# Patient Record
Sex: Female | Born: 1990
Health system: Southern US, Community
[De-identification: ages and names within clinical notes are randomized; demographics above are authoritative.]

## PROBLEM LIST (undated history)

## (undated) DIAGNOSIS — B009 Herpesviral infection, unspecified: Secondary | ICD-10-CM

## (undated) DIAGNOSIS — F1910 Other psychoactive substance abuse, uncomplicated: Secondary | ICD-10-CM

## (undated) DIAGNOSIS — N870 Mild cervical dysplasia: Secondary | ICD-10-CM

## (undated) DIAGNOSIS — B20 Human immunodeficiency virus [HIV] disease: Secondary | ICD-10-CM

## (undated) DIAGNOSIS — R739 Hyperglycemia, unspecified: Secondary | ICD-10-CM

## (undated) DIAGNOSIS — T7840XA Allergy, unspecified, initial encounter: Secondary | ICD-10-CM

## (undated) DIAGNOSIS — L309 Dermatitis, unspecified: Secondary | ICD-10-CM

## (undated) DIAGNOSIS — O139 Gestational [pregnancy-induced] hypertension without significant proteinuria, unspecified trimester: Secondary | ICD-10-CM

## (undated) DIAGNOSIS — A749 Chlamydial infection, unspecified: Secondary | ICD-10-CM

## (undated) DIAGNOSIS — O24419 Gestational diabetes mellitus in pregnancy, unspecified control: Secondary | ICD-10-CM

## (undated) DIAGNOSIS — A630 Anogenital (venereal) warts: Secondary | ICD-10-CM

## (undated) DIAGNOSIS — F331 Major depressive disorder, recurrent, moderate: Secondary | ICD-10-CM

## (undated) DIAGNOSIS — J45909 Unspecified asthma, uncomplicated: Secondary | ICD-10-CM

## (undated) DIAGNOSIS — R87629 Unspecified abnormal cytological findings in specimens from vagina: Secondary | ICD-10-CM

## (undated) DIAGNOSIS — F4321 Adjustment disorder with depressed mood: Secondary | ICD-10-CM

## (undated) DIAGNOSIS — L732 Hidradenitis suppurativa: Secondary | ICD-10-CM

## (undated) DIAGNOSIS — E119 Type 2 diabetes mellitus without complications: Secondary | ICD-10-CM

## (undated) DIAGNOSIS — J45990 Exercise induced bronchospasm: Secondary | ICD-10-CM

## (undated) DIAGNOSIS — G43009 Migraine without aura, not intractable, without status migrainosus: Secondary | ICD-10-CM

## (undated) DIAGNOSIS — L709 Acne, unspecified: Secondary | ICD-10-CM

## (undated) DIAGNOSIS — N900 Mild vulvar dysplasia: Secondary | ICD-10-CM

## (undated) DIAGNOSIS — R768 Other specified abnormal immunological findings in serum: Secondary | ICD-10-CM

## (undated) DIAGNOSIS — Z21 Asymptomatic human immunodeficiency virus [HIV] infection status: Secondary | ICD-10-CM

## (undated) HISTORY — DX: Other specified abnormal immunological findings in serum: R76.8

## (undated) HISTORY — DX: Hyperglycemia, unspecified: R73.9

## (undated) HISTORY — DX: Type 2 diabetes mellitus without complications: E11.9

## (undated) HISTORY — DX: Hidradenitis suppurativa: L73.2

## (undated) HISTORY — DX: Acne, unspecified: L70.9

## (undated) HISTORY — DX: Mild cervical dysplasia: N87.0

## (undated) HISTORY — DX: Dermatitis, unspecified: L30.9

## (undated) HISTORY — DX: Adjustment disorder with depressed mood: F43.21

## (undated) HISTORY — DX: Asymptomatic human immunodeficiency virus (hiv) infection status: Z21

## (undated) HISTORY — DX: Anogenital (venereal) warts: A63.0

## (undated) HISTORY — DX: Migraine without aura, not intractable, without status migrainosus: G43.009

## (undated) HISTORY — DX: Chlamydial infection, unspecified: A74.9

## (undated) HISTORY — DX: Unspecified asthma, uncomplicated: J45.909

## (undated) HISTORY — DX: Allergy, unspecified, initial encounter: T78.40XA

## (undated) HISTORY — DX: Mild vulvar dysplasia: N90.0

## (undated) HISTORY — DX: Exercise induced bronchospasm: J45.990

## (undated) HISTORY — DX: Other psychoactive substance abuse, uncomplicated: F19.10

## (undated) HISTORY — DX: Human immunodeficiency virus (HIV) disease: B20

## (undated) MED FILL — Medication: Fill #1 | Status: CN

---
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
## 2012-02-27 ENCOUNTER — Ambulatory Visit: Payer: 59 | Admitting: Family Medicine

## 2012-02-27 VITALS — BP 108/64 | HR 108 | Temp 98.8°F | Resp 20 | Ht 64.0 in | Wt 232.0 lb

## 2012-02-27 DIAGNOSIS — R51 Headache: Secondary | ICD-10-CM

## 2012-02-27 DIAGNOSIS — J329 Chronic sinusitis, unspecified: Secondary | ICD-10-CM

## 2012-02-27 DIAGNOSIS — R5381 Other malaise: Secondary | ICD-10-CM

## 2012-02-27 DIAGNOSIS — R509 Fever, unspecified: Secondary | ICD-10-CM

## 2012-02-27 DIAGNOSIS — R6889 Other general symptoms and signs: Secondary | ICD-10-CM

## 2012-02-27 LAB — POCT RAPID STREP A (OFFICE): Rapid Strep A Screen: NEGATIVE

## 2012-02-27 MED ORDER — CEFDINIR 300 MG PO CAPS: 300.0000 mg | ORAL_CAPSULE | Freq: Two times a day (BID) | ORAL | Status: DC

## 2012-02-27 NOTE — Progress Notes (Signed)
[facility] [address], [place] [address] [phone]  Date:  02/27/2012   Name:  Kelly Hunter   DOB:  01-15-1991   MRN:  [id number]  PCP:  Abbe Amsterdam, MD    Chief Complaint: Sore Throat, Fever and Migraine   History of Present Illness:  Kelly Hunter is a 22 y.o. very pleasant female patient who presents with the following:  She is here today with illness.  About a week ago she noted a cold- she had runny and stuffy nose, and a cough.  Her cold symptoms seemed to get better, but then yesterday she felt worse again and had a HA.  She is NOT having a ST at this time.  She also has body aches, a fever, chills, and fatigue.  She has an occasional cough.   She took some excedrin- last dose yesterday.    Actually, upon further review it turns out she has had the HA for 5 days, and she notes sinus pressure and pain.   She has a history of EIA and obesity  Patient Active Problem List  Diagnosis  . OBESITY, NOS  . ASTHMA, EXERCISE INDUCED  . HYDRADENITIS  . ACNE    Past Medical History  Diagnosis Date  . Allergy   . Asthma     Past Surgical History  Procedure Date  . Breast surgery     History  Substance Use Topics  . Smoking status: Never Smoker   . Smokeless tobacco: Not on file  . Alcohol Use: No    Family History  Problem Relation Age of Onset  . Diabetes Mother   . Hypertension Mother     No Known Allergies  Medication list has been reviewed and updated.  Current Outpatient Prescriptions on File Prior to Visit  Medication Sig Dispense Refill  . norgestimate-ethinyl estradiol (SPRINTEC 28) 0.25-35 MG-MCG tablet Take 1 tablet by mouth daily.  1 Package  11  . Triamcinolone Acetonide (TRIAMCINOLONE 0.1 % CREAM : EUCERIN) CREA Use as directed  1 each  11    Review of Systems:  As per HPI- otherwise negative.   Physical Examination: Filed Vitals:   02/27/12 1100  BP: 108/64  Pulse: 120  Temp: 99 F (37.2 C)    Resp: 20   Filed Vitals:   02/27/12 1100  Height: 5\' 4"  (1.626 m)  Weight: 232 lb (105.235 kg)   Body mass index is 39.82 kg/(m^2). Ideal Body Weight: Weight in (lb) to have BMI = 25: 145.3   GEN: WDWN, NAD, Non-toxic, A & O x 3, obese HEENT: Atraumatic, Normocephalic. Neck supple. No masses, No LAD.  Bilateral TM wnl, oropharynx injected especially on the right.   PEERL,EOMI.  eczema of the external ears Ears and Nose: No external deformity. CV: RRR, No M/G/R. No JVD. No thrill. No extra heart sounds. PULM: CTA B, no wheezes, crackles, rhonchi. No retractions. No resp. distress. No accessory muscle use. ABD: S, NT, ND, +BS. No rebound. No HSM. EXTR: No c/c/e NEURO Normal gait.  PSYCH: Normally interactive. Conversant. Not depressed or anxious appearing.  Calm demeanor.   Results for orders placed in visit on 02/27/12  POCT RAPID STREP A (OFFICE)      Component Value Range   Rapid Strep A Screen Negative  Negative  POCT INFLUENZA A/B      Component Value Range   Influenza A, POC Negative     Influenza B, POC Negative  Given 600 mg of ibuprofen by mouth at 11:45 am- temp and pulse better, her HA was relieved.    Assessment and Plan: 1. Sinusitis  cefdinir (OMNICEF) 300 MG capsule  2. Fever  POCT rapid strep A, POCT Influenza A/B  3. Malaise  POCT Influenza A/B  4. Headache     Kelly Hunter is here today with illness.  Suspect she has a sinus infection.  Will treat with omnicef.  Let me know if not better in the next few days, sooner if worse or if any severe HA or if worst HA of life occurs.  She and her mother agreed.    Abbe Amsterdam, MD

## 2012-02-27 NOTE — Patient Instructions (Addendum)
It looks like you have a sinus infection.  You should try mucinex for the sinus pressure and congestion, and ibuprofen/ tylenol as needed for pain. If your headache is not getting better, or if you have the worst headache you have ever had or there worrisome symptoms please let us know.

## 2012-02-29 ENCOUNTER — Telehealth: Payer: Self-pay

## 2012-02-29 NOTE — Telephone Encounter (Signed)
Pt states that she was in recently was and was given an antibiotic for sinus pressure, pt states that the antibiotic is not helping. Pt still complains of throat pain, vomiting and diarhhea, pt has not completed antibiotic as of yet. Please Advise.  Pharmacy: [facility] [address] Phone: [phone]

## 2012-02-29 NOTE — Telephone Encounter (Signed)
Called patient advised to take antibiotics with food. Drink lots of fluids, take Mucinex for sinus pressure. Left detailed message for her to call me back.

## 2012-03-01 ENCOUNTER — Ambulatory Visit: Payer: 59 | Admitting: Emergency Medicine

## 2012-03-01 ENCOUNTER — Other Ambulatory Visit: Payer: Self-pay | Admitting: Emergency Medicine

## 2012-03-01 ENCOUNTER — Telehealth: Payer: Self-pay

## 2012-03-01 VITALS — BP 120/76 | HR 84 | Temp 97.4°F | Resp 18 | Ht 64.0 in | Wt 232.0 lb

## 2012-03-01 DIAGNOSIS — K921 Melena: Secondary | ICD-10-CM

## 2012-03-01 DIAGNOSIS — R509 Fever, unspecified: Secondary | ICD-10-CM

## 2012-03-01 DIAGNOSIS — R110 Nausea: Secondary | ICD-10-CM

## 2012-03-01 DIAGNOSIS — R109 Unspecified abdominal pain: Secondary | ICD-10-CM

## 2012-03-01 DIAGNOSIS — R8290 Unspecified abnormal findings in urine: Secondary | ICD-10-CM

## 2012-03-01 DIAGNOSIS — R319 Hematuria, unspecified: Secondary | ICD-10-CM

## 2012-03-01 DIAGNOSIS — R82998 Other abnormal findings in urine: Secondary | ICD-10-CM

## 2012-03-01 DIAGNOSIS — J029 Acute pharyngitis, unspecified: Secondary | ICD-10-CM

## 2012-03-01 LAB — POCT URINALYSIS DIPSTICK
Glucose, UA: NEGATIVE
Spec Grav, UA: 1.025

## 2012-03-01 LAB — COMPREHENSIVE METABOLIC PANEL
Albumin: 4.1 g/dL (ref 3.5–5.2)
Alkaline Phosphatase: 40 U/L (ref 39–117)
BUN: 5 mg/dL — ABNORMAL LOW (ref 6–23)
CO2: 28 mEq/L (ref 19–32)
Calcium: 8.7 mg/dL (ref 8.4–10.5)
Glucose, Bld: 108 mg/dL — ABNORMAL HIGH (ref 70–99)
Potassium: 3.7 mEq/L (ref 3.5–5.3)

## 2012-03-01 LAB — POCT CBC
Hemoglobin: 13.7 g/dL (ref 12.2–16.2)
Lymph, poc: 1.9 (ref 0.6–3.4)
MCH, POC: 25.8 pg — AB (ref 27–31.2)
MCHC: 32.8 g/dL (ref 31.8–35.4)
MCV: 78.6 fL — AB (ref 80–97)
MID (cbc): 0.4 (ref 0–0.9)
MPV: 9.4 fL (ref 0–99.8)
POC MID %: 8 %M (ref 0–12)
WBC: 4.6 10*3/uL (ref 4.6–10.2)

## 2012-03-01 LAB — POCT UA - MICROSCOPIC ONLY: Crystals, Ur, HPF, POC: NEGATIVE

## 2012-03-01 MED ORDER — ONDANSETRON 8 MG PO TBDP: 8.0000 mg | ORAL_TABLET | Freq: Three times a day (TID) | ORAL | Status: DC | PRN

## 2012-03-01 NOTE — Telephone Encounter (Signed)
Diarrhea and throwing up from the antibiotics (she thinks)  Wants to know if the medicine is causing these issues.  Also, needs note for out - Thursday and Friday of this week   [phone]

## 2012-03-01 NOTE — Telephone Encounter (Signed)
Spoke with pt advised message from Amy, pt understood.

## 2012-03-01 NOTE — Progress Notes (Signed)
Subjective:    Patient ID: Kelly Hunter, female    DOB: 30-May-1990, 22 y.o.   MRN: [id number]  HPI Follow up from Monday. Patient is experiencing fever, sore throat, vomiting, diarrhea, blood in stool and nose bleed. Patient is also experiencing abdominal pain. Patient states that vomiting and diarrhea started the morning after her first dose of antibiotic.  Patient denies history of mono. Her throat is still sore. She has nausea. She has some upper abdominal discomfort. She had a loose stool and felt like there was blood in it. She only took 2 doses of antibiotics she's only had one stool where she saw some current jelly-type material. She continues to feel nauseated but has not vomited up any blood. She states she's only had one episode of blood in her stool .  Review of Systems     Objective:   Physical Exam HEENT exam is unremarkable. What is positive some mild redness of the posterior pharynx on the right greater than the left. There are no nodes palpable. Her chest was clear her cardiac exam revealed regular rate without murmurs the abdomen was obese bowel sounds are normal there is tenderness in the midepigastrium. Examination of skin reveals an acneform eruption.. She also has some half centimeter nonraised red macular areas on the forearms There is no rash visible on the lower extremities. There is no rash on the buttocks. Results for orders placed in visit on 03/01/12  POCT CBC      Component Value Range   WBC 4.6  4.6 - 10.2 K/uL   Lymph, poc 1.9  0.6 - 3.4   POC LYMPH PERCENT 40.7  10 - 50 %L   MID (cbc) 0.4  0 - 0.9   POC MID % 8.0  0 - 12 %M   POC Granulocyte 2.4  2 - 6.9   Granulocyte percent 51.3  37 - 80 %G   RBC 5.32  4.04 - 5.48 M/uL   Hemoglobin 13.7  12.2 - 16.2 g/dL   HCT, POC 40.9  81.1 - 47.9 %   MCV 78.6 (*) 80 - 97 fL   MCH, POC 25.8 (*) 27 - 31.2 pg   MCHC 32.8  31.8 - 35.4 g/dL   RDW, POC 91.4     Platelet Count, POC 166  142 - 424 K/uL   MPV 9.4  0 -  99.8 fL  POCT RAPID STREP A (OFFICE)      Component Value Range   Rapid Strep A Screen Negative  Negative  IFOBT (OCCULT BLOOD)      Component Value Range   IFOBT Positive          Assessment & Plan:  I have ordered a CBC strep test and stool for heme sure . I'm going to give her some Zofran for nausea. Also would like her to take Zantac 150 twice a day. She's not to take any antibiotics. We'll recheck in 24-48 hours if not improving. She can take Tylenol for the general malaise. She did have bilirubin in her urine however on exam she does not appear to be jaundiced. There is no icteric changes palms look normal I did not see any signs of elevated bilirubin. The positive bilirubin in her urine may be secondary to the blood she has. She definitely needs a repeat urine when she returns to clinic to see if there is blood in urine. Hopefully I will have her comprehensive metabolic panel back. Her last menstrual period was January  1. She is on birth control pills.

## 2012-03-01 NOTE — Telephone Encounter (Signed)
Called patient, if not better needs to return to clinic. Nausea reported yesterday, advised patient to try to take meds with food. I called and spoke to her mother. Patient now has blood in stool. Advised her to come in immediately for this. Mom states they are already here. Amy

## 2012-03-01 NOTE — Patient Instructions (Addendum)
Please take Zantac 150 mg twice a day. He can take extra strength Tylenol 2 capsules up to 3 times a day Given prescription for Zofran 8 mg to take as needed for nausea. Please return to clinic on Saturday for recheck.

## 2012-03-01 NOTE — Progress Notes (Deleted)
  Subjective:    Patient ID: Kelly Hunter, female    DOB: Jan 16, 1991, 22 y.o.   MRN: [id number]  HPI    Review of Systems     Objective:   Physical Exam        Assessment & Plan:

## 2012-03-02 ENCOUNTER — Telehealth: Payer: Self-pay | Admitting: Emergency Medicine

## 2012-03-02 LAB — EPSTEIN-BARR VIRUS VCA ANTIBODY PANEL
EBV EA IgG: 97.5 U/mL — ABNORMAL HIGH (ref <9.0)
EBV NA IgG: 486 U/mL — ABNORMAL HIGH (ref <18.0)
EBV VCA IgG: >750 U/mL — ABNORMAL HIGH (ref <18.0)
EBV VCA IgM: <10 U/mL (ref <36.0)

## 2012-03-03 ENCOUNTER — Ambulatory Visit (HOSPITAL_COMMUNITY)
Admission: RE | Admit: 2012-03-03 | Discharge: 2012-03-03 | Disposition: A | Discharge status: Home / Self Care | Payer: 59 | Source: Ambulatory Visit | Attending: Emergency Medicine | Admitting: Emergency Medicine

## 2012-03-03 ENCOUNTER — Ambulatory Visit (INDEPENDENT_AMBULATORY_CARE_PROVIDER_SITE_OTHER): Payer: 59 | Admitting: Emergency Medicine

## 2012-03-03 ENCOUNTER — Other Ambulatory Visit: Payer: Self-pay | Admitting: Emergency Medicine

## 2012-03-03 VITALS — BP 113/78 | HR 96 | Temp 98.6°F | Resp 17 | Ht 64.0 in | Wt 223.0 lb

## 2012-03-03 DIAGNOSIS — R319 Hematuria, unspecified: Secondary | ICD-10-CM

## 2012-03-03 DIAGNOSIS — R809 Proteinuria, unspecified: Secondary | ICD-10-CM

## 2012-03-03 DIAGNOSIS — K921 Melena: Secondary | ICD-10-CM

## 2012-03-03 DIAGNOSIS — A749 Chlamydial infection, unspecified: Secondary | ICD-10-CM

## 2012-03-03 DIAGNOSIS — R52 Pain, unspecified: Secondary | ICD-10-CM

## 2012-03-03 DIAGNOSIS — R700 Elevated erythrocyte sedimentation rate: Secondary | ICD-10-CM

## 2012-03-03 DIAGNOSIS — R109 Unspecified abdominal pain: Secondary | ICD-10-CM

## 2012-03-03 DIAGNOSIS — R7989 Other specified abnormal findings of blood chemistry: Secondary | ICD-10-CM

## 2012-03-03 DIAGNOSIS — R21 Rash and other nonspecific skin eruption: Secondary | ICD-10-CM

## 2012-03-03 HISTORY — DX: Chlamydial infection, unspecified: A74.9

## 2012-03-03 LAB — POCT CBC
Granulocyte percent: 67.5 %G (ref 37–80)
HCT, POC: 40.7 % (ref 37.7–47.9)
Hemoglobin: 13.2 g/dL (ref 12.2–16.2)
POC Granulocyte: 3.6 (ref 2–6.9)
POC LYMPH PERCENT: 25.8 %L (ref 10–50)
RBC: 5.2 M/uL (ref 4.04–5.48)
RDW, POC: 16.9 %

## 2012-03-03 LAB — COMPREHENSIVE METABOLIC PANEL
ALT: 124 U/L — ABNORMAL HIGH (ref 0–35)
AST: 89 U/L — ABNORMAL HIGH (ref 0–37)
BUN: 6 mg/dL (ref 6–23)
Calcium: 9.4 mg/dL (ref 8.4–10.5)
Chloride: 100 mEq/L (ref 96–112)
Creat: 0.73 mg/dL (ref 0.50–1.10)
Potassium: 3.7 mEq/L (ref 3.5–5.3)
Sodium: 140 mEq/L (ref 135–145)
Total Bilirubin: 0.5 mg/dL (ref 0.3–1.2)

## 2012-03-03 LAB — POCT UA - MICROSCOPIC ONLY
Casts, Ur, LPF, POC: NEGATIVE
Crystals, Ur, HPF, POC: NEGATIVE
Mucus, UA: POSITIVE

## 2012-03-03 LAB — POCT URINALYSIS DIPSTICK
Protein, UA: >300
pH, UA: 6.5

## 2012-03-03 LAB — POCT WET PREP WITH KOH
KOH Prep POC: NEGATIVE
Trichomonas, UA: NEGATIVE

## 2012-03-03 LAB — POCT SEDIMENTATION RATE: POCT SED RATE: 34 mm/hr — AB (ref 0–22)

## 2012-03-03 NOTE — Progress Notes (Signed)
  Subjective:    Patient ID: Kelly Hunter, female    DOB: 03-Dec-1990, 22 y.o.   MRN: [id number]  HPI    Review of Systems     Objective:   Physical Exam  Genitourinary: Vaginal discharge (there is moderate yellowish-white discharge present.) found.       Cervix appears WNL.  Negative chandelier sign. bimanula exam unremarkable except for mild TTP over uterus.  Adnexa unremarkable.          Assessment & Plan:

## 2012-03-03 NOTE — Progress Notes (Signed)
Subjective:    Patient ID: Kelly Hunter, female    DOB: May 01, 1990, 22 y.o.   MRN: [id number]  HPI  22 year old female here for follow up to have blood work reworked.  She has been on birth control for some time now.  She does get up to urinate at night but does not have any burning.  Has not had a bowel movement in a few days so no blood in stool that she knows of.  Never had bladder infections before.  No family history of gall bladder issues.  Was very sick two years ago when she was visiting her father and thinks that might be when she had mono.  She hurts in her upper right abdomen.  Mom is very concerned that she is not getting better afraid that she is missing a diagnosis.  Still has rash on her arms but did not spread any where else on body.  Her main complaints at this time are no fever continued upper abdominal pain headache and mild nausea and inability to eat.   Review of Systems she is on birth control pills. She has a history of asthma.     Objective:   Physical Exam patient does not look acutely ill. Her neck is supple. Her chest is clear. Her heart is regular rate without murmurs. The abdomen is soft there is tenderness in the mid epigastrium without rebound. Extremities are without edema. She has an acne-type eruption. She has 2 flat red half centimeter macules on the left forearm.    ESR 34    Results for orders placed in visit on 03/03/12  POCT URINALYSIS DIPSTICK      Component Value Range   Color, UA orange     Clarity, UA cloudy     Glucose, UA neg     Bilirubin, UA small     Ketones, UA trace     Spec Grav, UA 1.020     Blood, UA large     pH, UA 6.5     Protein, UA >300     Urobilinogen, UA 1.0     Nitrite, UA neg     Leukocytes, UA small (1+)    POCT UA - MICROSCOPIC ONLY      Component Value Range   WBC, Ur, HPF, POC 2-8     RBC, urine, microscopic TNTC     Bacteria, U Microscopic 2+     Mucus, UA pos     Epithelial cells, urine per micros 3-8     Crystals, Ur, HPF, POC neg     Casts, Ur, LPF, POC neg     Yeast, UA neg    POCT CBC      Component Value Range   WBC 5.3  4.6 - 10.2 K/uL   Lymph, poc 1.4  0.6 - 3.4   POC LYMPH PERCENT 25.8  10 - 50 %L   MID (cbc) 0.4  0 - 0.9   POC MID % 6.7  0 - 12 %M   POC Granulocyte 3.6  2 - 6.9   Granulocyte percent 67.5  37 - 80 %G   RBC 5.20  4.04 - 5.48 M/uL   Hemoglobin 13.2  12.2 - 16.2 g/dL   HCT, POC 40.9  81.1 - 47.9 %   MCV 78.2 (*) 80 - 97 fL   MCH, POC 25.4 (*) 27 - 31.2 pg   MCHC 32.4  31.8 - 35.4 g/dL   RDW, POC 16.9  Platelet Count, POC 214  142 - 424 K/uL   MPV 10.2  0 - 99.8 fL   Assessment & Plan:  I discussed followup. We'll proceed with an ultrasound of the liver and spleen and kidneys. I have added a sedimentation rate and ACE level and urine culture. She does have significant proteinuria so we will proceed with 24-hour urine for protein if culture is negative. White blood count remained normal. sedimentation rate is slightly elevated at 34. [place] normal. Recheck Monday

## 2012-03-03 NOTE — Patient Instructions (Addendum)
Please see me on Monday morning. Please go to the hospital for your ultrasound. Please drink fluids.

## 2012-03-04 LAB — RPR

## 2012-03-05 ENCOUNTER — Telehealth: Payer: Self-pay | Admitting: Emergency Medicine

## 2012-03-05 ENCOUNTER — Ambulatory Visit: Payer: 59 | Admitting: Emergency Medicine

## 2012-03-05 ENCOUNTER — Other Ambulatory Visit: Payer: Self-pay | Admitting: Emergency Medicine

## 2012-03-05 VITALS — BP 110/80 | HR 109 | Temp 98.4°F | Resp 18 | Ht 64.0 in | Wt 219.0 lb

## 2012-03-05 DIAGNOSIS — R109 Unspecified abdominal pain: Secondary | ICD-10-CM

## 2012-03-05 DIAGNOSIS — R7989 Other specified abnormal findings of blood chemistry: Secondary | ICD-10-CM

## 2012-03-05 DIAGNOSIS — R634 Abnormal weight loss: Secondary | ICD-10-CM

## 2012-03-05 DIAGNOSIS — R809 Proteinuria, unspecified: Secondary | ICD-10-CM

## 2012-03-05 LAB — IRON AND TIBC
%SAT: 9 % — ABNORMAL LOW (ref 20–55)
Iron: 40 ug/dL — ABNORMAL LOW (ref 42–145)
TIBC: 432 ug/dL (ref 250–470)
UIBC: 392 ug/dL (ref 125–400)

## 2012-03-05 LAB — URINE CULTURE

## 2012-03-05 LAB — POCT CBC
Granulocyte percent: 69.7 %G (ref 37–80)
HCT, POC: 40.6 % (ref 37.7–47.9)
Hemoglobin: 13.1 g/dL (ref 12.2–16.2)
MCV: 78 fL — AB (ref 80–97)
POC Granulocyte: 4.2 (ref 2–6.9)
RBC: 5.2 M/uL (ref 4.04–5.48)
RDW, POC: 16.9 %

## 2012-03-05 LAB — CMV ABS, IGG+IGM (CYTOMEGALOVIRUS): CMV IgM: <8 AU/mL (ref <30.00)

## 2012-03-05 LAB — FERRITIN: Ferritin: 94 ng/mL (ref 10–291)

## 2012-03-05 LAB — HEPATITIS PANEL, ACUTE
Hep B C IgM: NEGATIVE
Hepatitis B Surface Ag: NEGATIVE

## 2012-03-05 NOTE — Telephone Encounter (Signed)
fyi referrals: this appointment has already been made for today 03/05/12 at 3:15 pm to see Dr Elnoria Howard

## 2012-03-05 NOTE — Progress Notes (Signed)
  Subjective:    Patient ID: Kelly Hunter, female    DOB: 01-13-1991, 22 y.o.   MRN: [id number]  HPI Patient here with mom. States that she still feels the same, not feeling well at all, nausea without vomiting. Fatigue. She does not have an appetite, she is only eating applesauce. She has been not feeling well, sick a total of 4-6 weeks. No history of seeing a GI Dr. patient has not felt well for a period of time now. She has been nauseated with decreased by mouth intake unable to eat except for applesauce and fluids. There are multiple tests are pending on the patient. When I saw the patient today she tells me that she just feels about the same. She has not had a urinary symptoms .  Review of Systems     Objective:   Physical Exam Patient is alert and cooperative not toxic or ill-appearing. She has a significant acne eruption around her face. The previously noted red macules on both forearms are now resolved. Her neck is supple chest is clear heart regular rate without murmurs abdomen is tender mid epigastrium and right upper quadrant and there is no rebound noted. There is no edema noted. The rash on her forearms has resolved. Results for orders placed in visit on 03/05/12  POCT CBC      Component Value Range   WBC 6.0  4.6 - 10.2 K/uL   Lymph, poc 1.4  0.6 - 3.4   POC LYMPH PERCENT 23.3  10 - 50 %L   MID (cbc) 0.4  0 - 0.9   POC MID % 7.0  0 - 12 %M   POC Granulocyte 4.2  2 - 6.9   Granulocyte percent 69.7  37 - 80 %G   RBC 5.20  4.04 - 5.48 M/uL   Hemoglobin 13.1  12.2 - 16.2 g/dL   HCT, POC 84.1  32.4 - 47.9 %   MCV 78.0 (*) 80 - 97 fL   MCH, POC 25.2 (*) 27 - 31.2 pg   MCHC 32.3  31.8 - 35.4 g/dL   RDW, POC 40.1     Platelet Count, POC 378  142 - 424 K/uL   MPV 9.2  0 - 99.8 fL        Assessment & Plan:  We're currently waiting for the results of hepatitis B hepatitis A and CMV IgM. Her urine is markedly abnormal urine culture is still pending. We'll go ahead to the  next level and check for lupus, biliary cirrhosis, iron storage disease, Wilson's disease, and alpha-1 antitrypsin deficiency. The presence of an abnormal urine with elevated LFTs in a young female Female is worrisome for connective tissue disease like lupus. She does have significant proteinuria . We'll collect a 24-hour urine for protein. Her albumin was normal and her pressures have been normal her ASO titer is pending.She is going to see Dr. Elnoria Howard today at 3:30 for his opinion.

## 2012-03-06 LAB — ALPHA-1-ANTITRYPSIN: A-1 Antitrypsin, Ser: 222 mg/dL — ABNORMAL HIGH (ref 90–200)

## 2012-03-06 LAB — LIPID PANEL
Cholesterol: 179 mg/dL (ref 0–200)
Triglycerides: 135 mg/dL (ref <150)
VLDL: 27 mg/dL (ref 0–40)

## 2012-03-06 LAB — GC/CHLAMYDIA PROBE AMP
CT Probe RNA: POSITIVE — AB
GC Probe RNA: NEGATIVE

## 2012-03-06 LAB — ANTISTREPTOLYSIN O TITER: ASO: 138 IU/mL (ref <409)

## 2012-03-06 LAB — CERULOPLASMIN: Ceruloplasmin: 44 mg/dL (ref 20–60)

## 2012-03-06 NOTE — Addendum Note (Signed)
Addended by: Johnnette Litter on: 03/06/2012 02:52 PM   Modules accepted: Orders

## 2012-03-07 ENCOUNTER — Other Ambulatory Visit: Payer: Self-pay | Admitting: Emergency Medicine

## 2012-03-07 NOTE — Telephone Encounter (Signed)
There is no encounter at this time it was only a telephone message.

## 2012-03-09 LAB — UIFE/LIGHT CHAINS/TP QN, 24-HR UR
Alpha 2, Urine: DETECTED — AB
Beta, Urine: DETECTED — AB
Free Kappa Lt Chains,Ur: 2.75 mg/dL — ABNORMAL HIGH (ref 0.14–2.42)
Free Kappa/Lambda Ratio: 14.47 ratio — ABNORMAL HIGH (ref 2.04–10.37)
Free Lt Chn Excr Rate: 16.5 mg/d
Total Protein, Urine: 30.1 mg/dL

## 2012-03-14 ENCOUNTER — Telehealth: Payer: Self-pay

## 2012-03-14 NOTE — Telephone Encounter (Signed)
PT STATES SHE NEED A WRITTEN STATEMENT FROM THE DR ABOUT THE REASON SHE IS HAVING TO KEEP COMING BACK AND FORTH TO THE DR.  HAVE TO TURN IT IN FOR HER SCHOOL. PLEASE CALL [phone]

## 2012-03-14 NOTE — Telephone Encounter (Signed)
I can write this for her. I just need to know what exactly they need from the [place].

## 2012-03-14 NOTE — Telephone Encounter (Signed)
She is asking for a medical leave for the rest of the semester, until May, will you write this for her?

## 2012-03-16 NOTE — Telephone Encounter (Signed)
Pt needs a detailed letter of medical necessity to be out of school from jan 30 through feb 11  [phone]

## 2012-03-16 NOTE — Telephone Encounter (Signed)
Letter written, please review and sign, it is in my box.

## 2012-03-17 NOTE — Telephone Encounter (Signed)
Please ask Dr. Merla Riches to sign in my absence

## 2012-03-19 NOTE — Telephone Encounter (Signed)
Dr. Patsy Lager, you are familiar with this patient, can you sign the letter today? She is anxious to get it.  It is on my desk. Kanija Remmel

## 2012-04-18 ENCOUNTER — Encounter: Payer: Self-pay | Admitting: Emergency Medicine

## 2012-08-21 ENCOUNTER — Other Ambulatory Visit: Payer: Self-pay | Admitting: Family Medicine

## 2012-09-04 ENCOUNTER — Ambulatory Visit (INDEPENDENT_AMBULATORY_CARE_PROVIDER_SITE_OTHER): Payer: 59 | Admitting: Family Medicine

## 2012-09-04 ENCOUNTER — Encounter: Payer: Self-pay | Admitting: Family Medicine

## 2012-09-04 VITALS — BP 122/78 | HR 87 | Temp 98.1°F | Resp 16 | Ht 64.0 in | Wt 238.0 lb

## 2012-09-04 DIAGNOSIS — A749 Chlamydial infection, unspecified: Secondary | ICD-10-CM

## 2012-09-04 DIAGNOSIS — F411 Generalized anxiety disorder: Secondary | ICD-10-CM

## 2012-09-04 DIAGNOSIS — Z309 Encounter for contraceptive management, unspecified: Secondary | ICD-10-CM

## 2012-09-04 DIAGNOSIS — Z01419 Encounter for gynecological examination (general) (routine) without abnormal findings: Secondary | ICD-10-CM

## 2012-09-04 DIAGNOSIS — R7989 Other specified abnormal findings of blood chemistry: Secondary | ICD-10-CM

## 2012-09-04 DIAGNOSIS — R809 Proteinuria, unspecified: Secondary | ICD-10-CM

## 2012-09-04 LAB — POCT UA - MICROSCOPIC ONLY: RBC, urine, microscopic: NEGATIVE

## 2012-09-04 LAB — POCT URINALYSIS DIPSTICK
Glucose, UA: NEGATIVE
Ketones, UA: NEGATIVE
Spec Grav, UA: 1.025
Urobilinogen, UA: 0.2

## 2012-09-04 LAB — POCT WET PREP WITH KOH
KOH Prep POC: NEGATIVE
Trichomonas, UA: NEGATIVE
Yeast Wet Prep HPF POC: NEGATIVE

## 2012-09-04 LAB — COMPREHENSIVE METABOLIC PANEL
Albumin: 4.4 g/dL (ref 3.5–5.2)
Alkaline Phosphatase: 42 U/L (ref 39–117)
BUN: 8 mg/dL (ref 6–23)
Creat: 0.62 mg/dL (ref 0.50–1.10)
Glucose, Bld: 99 mg/dL (ref 70–99)
Total Bilirubin: 0.5 mg/dL (ref 0.3–1.2)

## 2012-09-04 LAB — TSH: TSH: 0.554 u[IU]/mL (ref 0.350–4.500)

## 2012-09-04 MED ORDER — FLUOXETINE HCL 20 MG PO TABS: 20.0000 mg | ORAL_TABLET | Freq: Every day | ORAL | Status: DC

## 2012-09-04 MED ORDER — NORGESTIMATE-ETH ESTRADIOL 0.25-35 MG-MCG PO TABS: 1.0000 | ORAL_TABLET | Freq: Every day | ORAL | Status: DC

## 2012-09-04 NOTE — Progress Notes (Signed)
[address]   [place], [place]  [address]   [phone]  Subjective:    Patient ID: Kelly Hunter, female    DOB: Jul 15, 1990, 22 y.o.   MRN: [id number]  HPI This 22 y.o. female presents for evaluation of the following:  1.  Contraception: previously took OCP in 03/2012; Dr. Cleta Alberts recommended stopping OCP at that time.  Having sexually; onset age 72; total partners = 4.  Not sexually active now.  Last sexual activity last year 02/2011.  +Chlamydia in 03/2012.Treated for Chlamydia in 03/2012.  Previously took Medical laboratory scientific officer and Loestrin.  Did well on Sprintec. Good remembering pill daily.  Works at [employer] as a Production assistant, radio; starts at [employer].  Five days per week.  Severe cramping. Chronic irregular menses; onset of menarche 4th grade.  +previous pelvic exam with acute illness.  No Gardisil series yet; mother reluctant to take since new.    2.  Illness:  Onset with common cold, n/v/d, abdominal pain, proteinuria.  S/p nephrology consultation; s/p gastroenterologist.  By time had seen nephrologist, protein resolved.  S/p GI consult, prescribed medication for GERD.  Feels great now.   3.  Anxiety: onset after graduation from high school.  Suffers with daily anxiety. History of depression and anxiety with parents separating in high school; +SI in high school; went to counseling during high school that was not a good experience.    Review of Systems  Constitutional: Negative for fever, chills, diaphoresis and fatigue.  Eyes: Negative for photophobia and visual disturbance.  Respiratory: Positive for cough. Negative for shortness of breath, wheezing and stridor.   Cardiovascular: Negative for chest pain, palpitations and leg swelling.  Gastrointestinal: Negative for vomiting, abdominal pain, diarrhea and constipation.  Endocrine: Negative for cold intolerance, heat intolerance, polydipsia, polyphagia and polyuria.  Genitourinary: Negative for dysuria, urgency, frequency, flank pain, decreased urine volume, genital  sores, menstrual problem and pelvic pain.  Musculoskeletal: Negative for myalgias, back pain, joint swelling, arthralgias and gait problem.  Skin: Positive for color change and rash. Negative for pallor.  Neurological: Negative for dizziness, tremors, seizures, syncope, facial asymmetry, speech difficulty, weakness, light-headedness, numbness and headaches.  Psychiatric/Behavioral: Negative for suicidal ideas, sleep disturbance, self-injury and dysphoric mood. The patient is nervous/anxious.        Objective:   Physical Exam  Nursing note and vitals reviewed. Constitutional: She is oriented to person, place, and time. She appears well-developed and well-nourished. No distress.  HENT:  Head: Normocephalic and atraumatic.  Right Ear: External ear normal.  Left Ear: External ear normal.  Nose: Nose normal.  Mouth/Throat: Oropharynx is clear and moist.  Eyes: Conjunctivae and EOM are normal. Pupils are equal, round, and reactive to light.  Neck: Normal range of motion. Neck supple. No thyromegaly present.  Cardiovascular: Normal rate, regular rhythm, normal heart sounds and intact distal pulses.  Exam reveals no gallop and no friction rub.   No murmur heard. Pulmonary/Chest: Effort normal and breath sounds normal. She has no wheezes. She has no rales.  Abdominal: Soft. Bowel sounds are normal. She exhibits no distension and no mass. There is no tenderness. There is no rebound and no guarding.  Genitourinary: Vagina normal and uterus normal. No breast swelling, tenderness, discharge or bleeding. There is no rash, tenderness or lesion on the right labia. There is no rash, tenderness or lesion on the left labia. Cervix exhibits no motion tenderness, no discharge and no friability. Right adnexum displays no mass, no tenderness and no fullness. Left adnexum displays  no mass, no tenderness and no fullness.  Musculoskeletal: Normal range of motion.  Lymphadenopathy:    She has no cervical adenopathy.   Neurological: She is alert and oriented to person, place, and time. No cranial nerve deficit. She exhibits normal muscle tone. Coordination normal.  Skin: Skin is warm and dry. No rash noted. She is not diaphoretic. No erythema.  Scattered small comedones facial area throughout.  Psychiatric: She has a normal mood and affect. Her behavior is normal. Judgment and thought content normal.        Assessment & Plan:  Contraception management - Plan: norgestimate-ethinyl estradiol (ORTHO-CYCLEN,SPRINTEC,PREVIFEM) 0.25-35 MG-MCG tablet  Routine gynecological examination  Chlamydia - Plan: POCT Wet Prep with KOH, GC/Chlamydia Probe Amp, CANCELED: GC/Chlamydia Probe Amp  Generalized anxiety disorder - Plan: TSH, FLUoxetine (PROZAC) 20 MG tablet  Proteinuria - Plan: POCT urinalysis dipstick, POCT UA - Microscopic Only  Elevated liver function tests - Plan: CBC with Differential, Comprehensive metabolic panel   1.  Contraception management: New. Rx for  Sprintec provided.  RTC three months to confirm good compliance and tolerance. 2.  Gynecological exam: completed. 3.  Chlamydia: New in 03/2012; test of cure obtained today; s/p treatment.  HIV and RPR negative in 03/2012; no recent sexual activity in past year. 4.  Anxiety disorder: New.  Rx for Prozac 20mg  daily provided.   5.  Proteinuria: Improved; s/p nephrology consultation; negative work up. 6. Elevated LFTs: improved; repeat today; associated with acute illness in 03/2012.  Meds ordered this encounter  Medications  . norgestimate-ethinyl estradiol (ORTHO-CYCLEN,SPRINTEC,PREVIFEM) 0.25-35 MG-MCG tablet    Sig: Take 1 tablet by mouth daily.    Dispense:  1 Package    Refill:  11  . FLUoxetine (PROZAC) 20 MG tablet    Sig: Take 1 tablet (20 mg total) by mouth daily.    Dispense:  30 tablet    Refill:  3

## 2012-09-05 LAB — CBC WITH DIFFERENTIAL/PLATELET
Basophils Relative: 0 % (ref 0–1)
Eosinophils Absolute: 0.1 10*3/uL (ref 0.0–0.7)
HCT: 34.2 % — ABNORMAL LOW (ref 36.0–46.0)
Hemoglobin: 11.4 g/dL — ABNORMAL LOW (ref 12.0–15.0)
MCH: 24.5 pg — ABNORMAL LOW (ref 26.0–34.0)
MCHC: 33.3 g/dL (ref 30.0–36.0)
Monocytes Absolute: 0.5 10*3/uL (ref 0.1–1.0)
Monocytes Relative: 8 % (ref 3–12)

## 2012-09-05 LAB — GC/CHLAMYDIA PROBE AMP
CT Probe RNA: NEGATIVE
GC Probe RNA: NEGATIVE

## 2012-10-03 ENCOUNTER — Ambulatory Visit (INDEPENDENT_AMBULATORY_CARE_PROVIDER_SITE_OTHER): Payer: 59 | Admitting: Emergency Medicine

## 2012-10-03 VITALS — BP 120/82 | HR 68 | Temp 98.4°F | Resp 18 | Ht 64.0 in | Wt 232.4 lb

## 2012-10-03 DIAGNOSIS — L732 Hidradenitis suppurativa: Secondary | ICD-10-CM

## 2012-10-03 MED ORDER — HYDROCODONE-ACETAMINOPHEN 5-325 MG PO TABS: 1.0000 | ORAL_TABLET | ORAL | Status: DC | PRN

## 2012-10-03 MED ORDER — SULFAMETHOXAZOLE-TRIMETHOPRIM 800-160 MG PO TABS: 1.0000 | ORAL_TABLET | Freq: Two times a day (BID) | ORAL | Status: DC

## 2012-10-03 NOTE — Patient Instructions (Addendum)
Ingrown Hair  An ingrown hair is a hair that curls and re-enters the skin instead of growing straight out of the skin. It happens most often with curly hair. It is usually more severe in the neck area, but it can occur in any shaved area, including the beard area, groin, scalp, and legs. An ingrown hair may cause small pockets of infection.  CAUSES   Shaving closely, tweezing, or waxing, especially curly hair. Using hair removal creams can sometimes lead to ingrown hairs, especially in the groin.  SYMPTOMS   · Small bumps on the skin. The bumps may be filled with pus.  · Pain.  · Itching.  DIAGNOSIS   Your caregiver can usually tell what is wrong by doing a physical exam.  TREATMENT   If there is a severe infection, your caregiver may prescribe antibiotic medicines. Laser hair removal may also be done to help prevent regrowth of the hair.  HOME CARE INSTRUCTIONS   · Do not shave irritated skin. You may start shaving again once the irritation has gone away.  · If you are prone to ingrown hairs, consider not shaving as much as possible.  · If antibiotics are prescribed, take them as directed. Finish them even if you start to feel better.  · You may use a facial sponge in a gentle circular motion to help dislodge ingrown hairs on the face.  · You may use a hair removal cream weekly, especially on the legs and underarms. Stop using the cream if it irritates your skin. Use caution when using hair removal creams in the groin area.  SHAVING INSTRUCTIONS AFTER TREATMENT  · Shower before shaving. Keep areas to be shaved packed in warm, moist wraps for several minutes before shaving. The warm, moist environment helps soften the hairs and makes ingrown hairs less likely to occur.  · Use thick shaving gels.  · Use a bump fighter razor that cuts hair slightly above the skin level or use an electric shaver with a longer shave setting.  · Shave in the direction of hair growth. Avoid making multiple razor strokes.  · Use  moisturizing lotions after shaving.  Document Released: 04/25/2000 Document Revised: 07/19/2011 Document Reviewed: 04/19/2011  ExitCare® Patient Information ©2014 [employer].

## 2012-10-03 NOTE — Progress Notes (Signed)
[facility] [address] [place] [address] [phone]  Date:  10/03/2012   Name:  Kelly Hunter   DOB:  Jun 04, 1990   MRN:  [id number]  PCP:  Abbe Amsterdam, MD    Chief Complaint: Other   History of Present Illness:  Kelly Hunter is a 22 y.o. very pleasant female patient who presents with the following: Long history of swollen tender "bumps" in her axillae.  Now has two abscesses in left axilla one that is spontaneously draining.  No fever or chills.  No improvement with over the counter medications or other home remedies. Denies other complaint or health concern today.   Patient Active Problem List   Diagnosis Date Noted  . OBESITY, NOS 03/30/2006  . ASTHMA, EXERCISE INDUCED 03/30/2006  . HYDRADENITIS 03/30/2006  . ACNE 03/30/2006    Past Medical History  Diagnosis Date  . Allergy     Allegra  . Asthma     mild; onset in childhood.  No hospitalizations.  . Eczema   . Acne     Past Surgical History  Procedure Laterality Date  . Breast surgery  02/01/2008    Reduction    History  Substance Use Topics  . Smoking status: Never Smoker   . Smokeless tobacco: Not on file  . Alcohol Use: No    Family History  Problem Relation Age of Onset  . Diabetes Mother   . Hypertension Mother   . Hyperlipidemia Mother     No Known Allergies  Medication list has been reviewed and updated.  Current Outpatient Prescriptions on File Prior to Visit  Medication Sig Dispense Refill  . FLUoxetine (PROZAC) 20 MG tablet Take 1 tablet (20 mg total) by mouth daily.  30 tablet  3  . norgestimate-ethinyl estradiol (ORTHO-CYCLEN,SPRINTEC,PREVIFEM) 0.25-35 MG-MCG tablet Take 1 tablet by mouth daily.  1 Package  11  . Triamcinolone Acetonide (TRIAMCINOLONE 0.1 % CREAM : EUCERIN) CREA Use as directed  1 each  11   No current facility-administered medications on file prior to visit.    Review of Systems:  As per HPI, otherwise negative.     Physical Examination: Filed Vitals:   10/03/12 1348  BP: 120/82  Pulse: 68  Temp: 98.4 F (36.9 C)  Resp: 18   Filed Vitals:   10/03/12 1348  Height: 5\' 4"  (1.626 m)  Weight: 232 lb 6.4 oz (105.416 kg)   Body mass index is 39.87 kg/(m^2). Ideal Body Weight: Weight in (lb) to have BMI = 25: 145.3   GEN: WDWN, NAD, Non-toxic, Alert & Oriented x 3 HEENT: Atraumatic, Normocephalic.  Ears and Nose: No external deformity. EXTR: No clubbing/cyanosis/edema NEURO: Normal gait.  PSYCH: Normally interactive. Conversant. Not depressed or anxious appearing.  Calm demeanor.  Axillae:  Bilateral hydradenitis.  Draining abscess left axilla  Assessment and Plan: Hydradenitis Septra vicodin Follow up in one week  Signed,  Phillips Odor, MD

## 2012-10-05 ENCOUNTER — Ambulatory Visit (INDEPENDENT_AMBULATORY_CARE_PROVIDER_SITE_OTHER): Payer: 59 | Admitting: Internal Medicine

## 2012-10-05 VITALS — BP 122/68 | HR 111 | Temp 99.7°F | Resp 18 | Ht 63.5 in | Wt 232.8 lb

## 2012-10-05 DIAGNOSIS — IMO0002 Reserved for concepts with insufficient information to code with codable children: Secondary | ICD-10-CM

## 2012-10-05 DIAGNOSIS — M25529 Pain in unspecified elbow: Secondary | ICD-10-CM

## 2012-10-05 DIAGNOSIS — L02419 Cutaneous abscess of limb, unspecified: Secondary | ICD-10-CM

## 2012-10-05 NOTE — Progress Notes (Signed)
  Subjective:    Patient ID: Kelly Hunter, female    DOB: 11/18/1990, 22 y.o.   MRN: [id number]  HPI  Patient presents with worsening boils under both arms. Was seen 2 days prior and had one area expressed and was started on bactrim. She has since had increased pain and size of both areas. No fever or chills. Pain does not radiate into neck or down arms.  Review of Systems No prior abscesses    Objective:   Physical Exam Well developed, well nourished female in NAD. Right axilla with 2 boils, one approximately 2 cm, smaller one approx. 1.5. Left axilla with large, approx. 6 cm fluctuant boil, smaller 1 cm area with evidence of drainage. All areas very tender to palpation.     Assessment & Plan:  Axillary abscess - Plan: Wound culture Right axilla abscess I&D x 2, left axilla abscess I&D x 1.  Continue bactrim. Change dressings tomorrow or sooner if they get wet. Warm compresses several times a day. Return in 2 days for follow up.   DGessnerFNP/RPD

## 2012-10-05 NOTE — Progress Notes (Signed)
   Patient ID: Kelly Hunter MRN: [id number], DOB: 05-15-1990, 22 y.o. Date of Encounter: 10/05/2012, 11:23 AM    PROCEDURE NOTE: Verbal consent obtained. Risks and benefits of the procedure were explained to the patient. Patient made an informed decision to proceed with the procedure. Betadine prep per usual protocol x 3 lesions (2 on right axilla and 1 on left axilla) Local anesthesia obtained with 1% plain lidocaine 2 cc each lesion.  1.5 cm incision made with 11 blade along each lesion.  Culture taken from first lesion. Moderate amount of purulence expressed from 2 largest lesions Smaller inferior lesion along right axilla did not generate any purulence upon incision. Lesions explored revealing no loculations. Irrigated with normal saline.  The 2 larger lesions were packed with 1/4 inch plain packing.  Dressed. Wound care instructions including precautions with patient. Patient tolerated the procedure well. Recheck in 48 hours.      Signed, Eula Listen, PA-C 10/05/2012 11:23 AM

## 2012-10-06 LAB — WOUND CULTURE: Gram Stain: NONE SEEN

## 2012-10-08 ENCOUNTER — Ambulatory Visit (INDEPENDENT_AMBULATORY_CARE_PROVIDER_SITE_OTHER): Payer: 59 | Admitting: Physician Assistant

## 2012-10-08 VITALS — BP 120/84 | HR 86 | Temp 98.3°F | Resp 20 | Ht 64.0 in | Wt 232.4 lb

## 2012-10-08 DIAGNOSIS — IMO0002 Reserved for concepts with insufficient information to code with codable children: Secondary | ICD-10-CM

## 2012-10-08 DIAGNOSIS — L02419 Cutaneous abscess of limb, unspecified: Secondary | ICD-10-CM

## 2012-10-08 LAB — WOUND CULTURE: Gram Stain: NONE SEEN

## 2012-10-08 NOTE — Progress Notes (Signed)
Patient ID: Kelly Hunter MRN: [id number], DOB: 10/02/1990 22 y.o. Date of Encounter: 10/08/2012, 9:29 AM  Chief Complaint: Wound care   See previous note  HPI: 22 y.o. y/o female presents for wound care s/p I&D on 10/05/12.   Doing well No issues or complaints Afebrile/ no chills No nausea or vomiting Tolerating Septra.  Pain improved. Left axilla more tender than right.   Both do seem to be improving.   Daily dressing change Previous note reviewed  Past Medical History  Diagnosis Date  . Allergy     Allegra  . Asthma     mild; onset in childhood.  No hospitalizations.  . Eczema   . Acne      Home Meds: Prior to Admission medications   Medication Sig Start Date End Date Taking? Authorizing Provider  FLUoxetine (PROZAC) 20 MG tablet Take 1 tablet (20 mg total) by mouth daily. 09/04/12  Yes Ethelda Chick, MD  HYDROcodone-acetaminophen (NORCO) 5-325 MG per tablet Take 1-2 tablets by mouth every 4 (four) hours as needed for pain. 10/03/12  Yes Phillips Odor, MD  norgestimate-ethinyl estradiol (ORTHO-CYCLEN,SPRINTEC,PREVIFEM) 0.25-35 MG-MCG tablet Take 1 tablet by mouth daily. 09/04/12  Yes Ethelda Chick, MD  sulfamethoxazole-trimethoprim (BACTRIM DS,SEPTRA DS) 800-160 MG per tablet Take 1 tablet by mouth 2 (two) times daily. 10/03/12  Yes Phillips Odor, MD  Triamcinolone Acetonide (TRIAMCINOLONE 0.1 % CREAM : EUCERIN) CREA Use as directed 06/08/11  Yes Ryan M Dunn, PA-C    Allergies: No Known Allergies  ROS: Constitutional: Afebrile, no chills Cardiovascular: negative for chest pain or palpitations Dermatological: Positive for wound. Negative for erythema, pain, or warmth.   GI: No nausea or vomiting   EXAM: Physical Exam: Blood pressure 120/84, pulse 86, temperature 98.3 F (36.8 C), temperature source Oral, resp. rate 20, height 5\' 4"  (1.626 m), weight 232 lb 6.4 oz (105.416 kg), last menstrual period 09/27/2012, SpO2 98.00%., Body mass index is 39.87  kg/(m^2). General: Well developed, well nourished, in no acute distress. Nontoxic appearing. Head: Normocephalic, atraumatic, sclera non-icteric.  Neck: Supple. Lungs: Breathing is unlabored. Heart: Normal rate. Skin:  Warm and moist. Dressing and packing in place. No induration, erythema, or tenderness to palpation. Neuro: Alert and oriented X 3. Moves all extremities spontaneously. Normal gait.  Psych:  Responds to questions appropriately with a normal affect.       PROCEDURE: Dressing and packing removed. No purulence expressed Wound bed healthy Irrigated with 1% plain lidocaine 5 cc. Repacked with 1/4 plain packing.   Dressing applied  LAB: Culture: MRSA  A/P: 22 y.o. y/o female with axillary cellulitis/abscess as above s/p I&D on 10/05/12.  Wound care per above Continue Septra Pain well controlled Daily dressing changes Recheck 48 hours  Signed, Rhoderick Moody, PA-C 10/08/2012 9:29 AM

## 2012-10-09 ENCOUNTER — Telehealth: Payer: Self-pay | Admitting: *Deleted

## 2012-10-09 NOTE — Telephone Encounter (Signed)
Pt called and stated that packing had fell out.  Advised pt to make sure she follow up tom as directed and to make sure she keeps it covered.

## 2012-10-10 ENCOUNTER — Ambulatory Visit (INDEPENDENT_AMBULATORY_CARE_PROVIDER_SITE_OTHER): Payer: 59 | Admitting: Physician Assistant

## 2012-10-10 VITALS — BP 118/74 | HR 91 | Temp 98.4°F | Resp 18 | Ht 64.5 in | Wt 231.0 lb

## 2012-10-10 DIAGNOSIS — IMO0002 Reserved for concepts with insufficient information to code with codable children: Secondary | ICD-10-CM

## 2012-10-10 DIAGNOSIS — L02419 Cutaneous abscess of limb, unspecified: Secondary | ICD-10-CM

## 2012-10-10 DIAGNOSIS — L732 Hidradenitis suppurativa: Secondary | ICD-10-CM

## 2012-10-10 NOTE — Patient Instructions (Signed)
Consider electrolysis for permanent removal of the hair in this area.

## 2012-10-10 NOTE — Progress Notes (Signed)
  Subjective:    Patient ID: Kelly Hunter, female    DOB: 04/11/1990, 22 y.o.   MRN: [id number]  HPI This 22 y.o. female presents for evaluation of cellulitis/abscess of bilateral axillae, s/p I&D on 10/05/2012.  She is tolerating the SMX-TMP without difficulty.  No fever/chills. Pain is resolved. Daily dressing changes.  The packing came out of the wound in the LEFT axilla yesterday.    She has longstanding hidradenitis suppurativa.    Review of Systems As above.    Objective:   Physical Exam BP 118/74  Pulse 91  Temp(Src) 98.4 F (36.9 C) (Oral)  Resp 18  Ht 5' 4.5" (1.638 m)  Wt 231 lb (104.781 kg)  BMI 39.05 kg/m2  SpO2 96%  LMP 09/27/2012 WDWNBF, A&O x 3. Dressings removed bilaterally. The RIGHT axillary wound without erythema or induration.  Packing removed, revealing very shallow wound bed, beefy red, without purulence. The LEFT axillary wound without erythema.  Non-tender indurated area posterior to the wound.  No purulence. Both axilla scrubbed with soap and water and rinsed, then dressed.       Assessment & Plan:  Axillary abscess  Local wound care until healed.  Hydradenitis - consider electrolysis for permanent hair removal.  Fernande Bras, PA-C Physician Assistant-Certified [facility]

## 2012-12-11 ENCOUNTER — Ambulatory Visit (INDEPENDENT_AMBULATORY_CARE_PROVIDER_SITE_OTHER): Payer: 59 | Admitting: Family Medicine

## 2012-12-11 ENCOUNTER — Encounter: Payer: Self-pay | Admitting: Family Medicine

## 2012-12-11 VITALS — BP 119/79 | HR 93 | Temp 98.3°F | Resp 18 | Ht 64.0 in | Wt 240.0 lb

## 2012-12-11 DIAGNOSIS — F411 Generalized anxiety disorder: Secondary | ICD-10-CM

## 2012-12-11 DIAGNOSIS — Z309 Encounter for contraceptive management, unspecified: Secondary | ICD-10-CM

## 2012-12-11 DIAGNOSIS — N760 Acute vaginitis: Secondary | ICD-10-CM

## 2012-12-11 DIAGNOSIS — N898 Other specified noninflammatory disorders of vagina: Secondary | ICD-10-CM

## 2012-12-11 DIAGNOSIS — B9689 Other specified bacterial agents as the cause of diseases classified elsewhere: Secondary | ICD-10-CM

## 2012-12-11 LAB — POCT WET PREP WITH KOH
KOH Prep POC: NEGATIVE
Yeast Wet Prep HPF POC: NEGATIVE

## 2012-12-11 MED ORDER — METRONIDAZOLE 500 MG PO TABS: 500.0000 mg | ORAL_TABLET | Freq: Three times a day (TID) | ORAL | Status: DC

## 2012-12-11 MED ORDER — DESOGESTREL-ETHINYL ESTRADIOL 0.15-30 MG-MCG PO TABS: 1.0000 | ORAL_TABLET | Freq: Every day | ORAL | Status: DC

## 2012-12-11 MED ORDER — FLUOXETINE HCL 20 MG PO TABS: 20.0000 mg | ORAL_TABLET | Freq: Every day | ORAL | Status: DC

## 2012-12-11 NOTE — Progress Notes (Signed)
[address]   [place], [place]  [address]   [phone]  Subjective:    Patient ID: Kelly Hunter, female    DOB: 14-Jul-1990, 22 y.o.   MRN: [id number]  HPI This 22 y.o. female presents for three month follow-up:  1.  Contraception: getting recurrent yeast infections with Sprintec. Previous use of Loestra in past; no problems with yeast infections.  Remembering to take OCP daily.   2.  Anxiety/depression:  Last visit, suffering with a lot of stress.  Started on Prozac at last visit.  No change in mood but everyone else has seen a difference.  Mother says that pt is nicer.  Friends could not see a difference.  Tends to [facility].  3. Yeast infection: having intermittent vaginal itching; +vaginal discharge; no sexual activity since last visit.  Review of Systems  Constitutional: Negative for fever, chills, diaphoresis and fatigue.  Gastrointestinal: Negative for nausea, vomiting and abdominal pain.  Genitourinary: Negative for dysuria, hematuria, vaginal bleeding, vaginal discharge, genital sores and vaginal pain.  Skin: Negative for rash.  Psychiatric/Behavioral: Negative for suicidal ideas, sleep disturbance, self-injury and dysphoric mood. The patient is nervous/anxious.    Past Medical History  Diagnosis Date  . Allergy     Allegra  . Asthma     mild; onset in childhood.  No hospitalizations.  . Eczema   . Acne   . Hidradenitis    Past Surgical History  Procedure Laterality Date  . Breast surgery  02/01/2008    Reduction   No Known Allergies Current Outpatient Prescriptions on File Prior to Visit  Medication Sig Dispense Refill  . norgestimate-ethinyl estradiol (ORTHO-CYCLEN,SPRINTEC,PREVIFEM) 0.25-35 MG-MCG tablet Take 1 tablet by mouth daily.  1 Package  11  . Triamcinolone Acetonide (TRIAMCINOLONE 0.1 % CREAM : EUCERIN) CREA Use as directed  1 each  11   No current facility-administered medications on file prior to visit.   History   Social History  .  Marital Status: Single    Spouse Name: n/a    Number of Children: 0  . Years of Education: N/A   Occupational History  . server     [employer]   Social History Main Topics  . Smoking status: Never Smoker   . Smokeless tobacco: Never Used  . Alcohol Use: No     Comment: previously used socially  . Drug Use: No  . Sexual Activity: Yes    Partners: Male    Birth Control/ Protection: Pill     Comment: 4 total sexual partners; history of Chlamydia in 03/2012.   Other Topics Concern  . Not on file   Social History Narrative   Marital status: single;  dating casually.        Children: none; never pregnant.      Lives: with mom; dad in [place].     Employment: works full time at [employer].      Education: previously in school; took medical leave in 02/2012; [employer] in [place]; plans to return in Spring 2015.      Tobacco: vapor cigarette two months.       Alcohol:  None      Drugs: none      Sexual activity: sexually active; 4 total partners; males; Chlamydia in 03/2012.      Seatbelt: 100%       Objective:   Physical Exam  Nursing note and vitals reviewed. Constitutional: She is oriented to person, place, and time. She appears well-developed and well-nourished. No  distress.  Eyes: Conjunctivae and EOM are normal. Pupils are equal, round, and reactive to light.  Neck: Normal range of motion. Neck supple. Carotid bruit is not present. No thyromegaly present.  Cardiovascular: Normal rate, regular rhythm, normal heart sounds and intact distal pulses.  Exam reveals no gallop and no friction rub.   No murmur heard. Pulmonary/Chest: Effort normal and breath sounds normal. She has no wheezes. She has no rales.  Abdominal: Soft. Bowel sounds are normal. She exhibits no distension and no mass. There is no tenderness. There is no rebound and no guarding.  Genitourinary: Vagina normal and uterus normal. There is no rash, tenderness or lesion on the right labia. There is no rash, tenderness or lesion on  the left labia. Cervix exhibits no motion tenderness, no discharge and no friability. Right adnexum displays no mass, no tenderness and no fullness. Left adnexum displays no mass, no tenderness and no fullness. No vaginal discharge found.  Lymphadenopathy:    She has no cervical adenopathy.  Neurological: She is alert and oriented to person, place, and time. No cranial nerve deficit.  Skin: Skin is warm and dry. No rash noted. She is not diaphoretic. No erythema. No pallor.  Psychiatric: She has a normal mood and affect. Her behavior is normal.    Results for orders placed in visit on 12/11/12  POCT WET PREP WITH KOH      Result Value Range   Trichomonas, UA Negative     Clue Cells Wet Prep HPF POC 10-15     Epithelial Wet Prep HPF POC 1-tntc     Yeast Wet Prep HPF POC neg     Bacteria Wet Prep HPF POC 1+     RBC Wet Prep HPF POC 0-4     WBC Wet Prep HPF POC 5-tntc     KOH Prep POC Negative         Assessment & Plan:  Vaginal discharge - Plan: POCT Wet Prep with KOH, GC/Chlamydia Probe Amp  Generalized anxiety disorder - Plan: DISCONTINUED: FLUoxetine (PROZAC) 20 MG tablet  Contraception management  BV (bacterial vaginosis)  1. Vaginal discharge:  New.  Send GC/Chlam.   2.  Anxiety: improved with Prozac; no change to therapy at this time. 3.  Contraception management: agreeable to switch to different OCP due to increased vaginal infections.  Switch to Apri. 4.  BV: New. Rx for Metronidazole provided.  Meds ordered this encounter  Medications  . DISCONTD: desogestrel-ethinyl estradiol (APRI,EMOQUETTE,SOLIA) 0.15-30 MG-MCG tablet    Sig: Take 1 tablet by mouth daily.    Dispense:  1 Package    Refill:  11  . DISCONTD: FLUoxetine (PROZAC) 20 MG tablet    Sig: Take 1 tablet (20 mg total) by mouth daily.    Dispense:  30 tablet    Refill:  3  . DISCONTD: metroNIDAZOLE (FLAGYL) 500 MG tablet    Sig: Take 1 tablet (500 mg total) by mouth 3 (three) times daily.    Dispense:   21 tablet    Refill:  0   Nilda Simmer, M.D.  [facility] [address] [place], [place]  [address] [phone] phone [phone] fax

## 2012-12-12 LAB — GC/CHLAMYDIA PROBE AMP: CT Probe RNA: NEGATIVE

## 2013-03-11 ENCOUNTER — Ambulatory Visit: Payer: 59 | Admitting: Family Medicine

## 2013-04-10 ENCOUNTER — Encounter: Payer: Self-pay | Admitting: Family Medicine

## 2013-04-10 ENCOUNTER — Ambulatory Visit (INDEPENDENT_AMBULATORY_CARE_PROVIDER_SITE_OTHER): Payer: 59 | Admitting: Family Medicine

## 2013-04-10 VITALS — BP 126/82 | HR 96 | Temp 98.9°F | Resp 18 | Ht 63.5 in | Wt 241.0 lb

## 2013-04-10 DIAGNOSIS — R5383 Other fatigue: Secondary | ICD-10-CM

## 2013-04-10 DIAGNOSIS — F4323 Adjustment disorder with mixed anxiety and depressed mood: Secondary | ICD-10-CM

## 2013-04-10 DIAGNOSIS — Z309 Encounter for contraceptive management, unspecified: Secondary | ICD-10-CM

## 2013-04-10 DIAGNOSIS — R6889 Other general symptoms and signs: Secondary | ICD-10-CM

## 2013-04-10 DIAGNOSIS — L259 Unspecified contact dermatitis, unspecified cause: Secondary | ICD-10-CM

## 2013-04-10 DIAGNOSIS — L309 Dermatitis, unspecified: Secondary | ICD-10-CM

## 2013-04-10 LAB — COMPLETE METABOLIC PANEL WITH GFR
ALT: 25 U/L (ref 0–35)
AST: 25 U/L (ref 0–37)
Albumin: 3.9 g/dL (ref 3.5–5.2)
Alkaline Phosphatase: 42 U/L (ref 39–117)
BILIRUBIN TOTAL: 0.4 mg/dL (ref 0.2–1.2)
BUN: 7 mg/dL (ref 6–23)
CHLORIDE: 103 meq/L (ref 96–112)
CO2: 27 mEq/L (ref 19–32)
CREATININE: 0.52 mg/dL (ref 0.50–1.10)
Calcium: 9 mg/dL (ref 8.4–10.5)
GFR, Est African American: >89 mL/min
GFR, Est Non African American: >89 mL/min
Glucose, Bld: 155 mg/dL — ABNORMAL HIGH (ref 70–99)
Potassium: 4 mEq/L (ref 3.5–5.3)
Sodium: 137 mEq/L (ref 135–145)
Total Protein: 7.7 g/dL (ref 6.0–8.3)

## 2013-04-10 LAB — CBC WITH DIFFERENTIAL/PLATELET
BASOS ABS: 0 10*3/uL (ref 0.0–0.1)
Basophils Relative: 0 % (ref 0–1)
EOS PCT: 1 % (ref 0–5)
Eosinophils Absolute: 0.1 10*3/uL (ref 0.0–0.7)
HCT: 36.4 % (ref 36.0–46.0)
Hemoglobin: 12.3 g/dL (ref 12.0–15.0)
Lymphocytes Relative: 22 % (ref 12–46)
Lymphs Abs: 1.6 10*3/uL (ref 0.7–4.0)
MCH: 25.8 pg — ABNORMAL LOW (ref 26.0–34.0)
MCHC: 33.8 g/dL (ref 30.0–36.0)
MCV: 76.5 fL — AB (ref 78.0–100.0)
Monocytes Absolute: 0.7 10*3/uL (ref 0.1–1.0)
Monocytes Relative: 10 % (ref 3–12)
Neutro Abs: 4.8 10*3/uL (ref 1.7–7.7)
Neutrophils Relative %: 67 % (ref 43–77)
PLATELETS: 338 10*3/uL (ref 150–400)
RBC: 4.76 MIL/uL (ref 3.87–5.11)
RDW: 17.5 % — AB (ref 11.5–15.5)
WBC: 7.1 10*3/uL (ref 4.0–10.5)

## 2013-04-10 NOTE — Progress Notes (Signed)
Subjective:   This chart was scribed for Kelly Honour, MD by Forrestine Him, [facility] Scribe. This patient was seen in room 21 and the patient's care was started 4:37 PM.     Patient ID: Kelly Hunter, female    DOB: 1990/10/01, 23 y.o.   MRN: [id number]  04/10/2013  Follow-up, Anxiety, Contraception and Eczema   HPI  HPI Comments: Kelly Hunter is a 23 y.o. female who presents to [facility] for 4 month follow up for contraceptive management and anxiety/depression today.  Pt states she has been "okay". States she worked a lot during the holiday season.  Emotionally she is doing fine. She has stopped using the Prozac about 1 month ago due to ineffectiveness. However, pt states she is happy off of her medication.  Denies excessive worry, nervousness, sadness, hopelessness.   She reports increased fatigue. Wakes up around noon most days and goes to work around 4-6 PM to midnight most days. Saturdays she works from [employer] AM. Currently she does not exercise regularly, but plans to start going to the gym in her neighborhood. She also admits to cold intolerance; worried about recurrent anemia.  States she is still experiencing yeast infections with severe itching but without vaginal discharge. She reports about 2 per month. She says she uses AZO yeast pill with mild improvement. She states she currently uses Dove as a daily cleanser, Tide detergent, degree roll on deodorant, and Bath and Body Wash lotion daily.   States she has been having a lot of eczema flare ups specifically to her breast, back, and abdomen. Pt has been using prescribed Triamcinolone Acetonide with temporary improvement. Currently she takes a bath once daily, but states she sometimes bathes twice a day.  She uses [employer] lotion on legs and arms only.  She currently lives with her Mother. Plans to go back to school soon. She is currently dating someone, and has been  together since August 2014.  Denies any vaginal discharge with vaginal itching; vaginal itching/yeast infection does seem to worsen when eczema worsens.  Review of Systems  Constitutional: Positive for fatigue. Negative for fever and chills.  HENT: Negative for congestion.   Eyes: Negative for redness.  Respiratory: Negative for cough.   Gastrointestinal: Negative for abdominal pain.  Endocrine: Positive for cold intolerance. Negative for heat intolerance, polydipsia, polyphagia and polyuria.  Genitourinary: Negative for dysuria, urgency, frequency, hematuria, vaginal bleeding, vaginal discharge, genital sores and vaginal pain.  Skin: Positive for rash.  Neurological: Negative for weakness.  Psychiatric/Behavioral: Negative for confusion and dysphoric mood. The patient is not nervous/anxious.     Past Medical History  Diagnosis Date   Allergy     Allegra   Asthma     mild; onset in childhood.  No hospitalizations.   Eczema    Acne    Hidradenitis    No Known Allergies Current Outpatient Prescriptions  Medication Sig Dispense Refill   FLUoxetine (PROZAC) 20 MG tablet Take 1 tablet (20 mg total) by mouth daily.  30 tablet  3   norgestimate-ethinyl estradiol (ORTHO-CYCLEN,SPRINTEC,PREVIFEM) 0.25-35 MG-MCG tablet Take 1 tablet by mouth daily.  1 Package  11   Triamcinolone Acetonide (TRIAMCINOLONE 0.1 % CREAM : EUCERIN) CREA Use as directed  1 each  11   No current facility-administered medications for this visit.   History   Social History   Marital Status: Single    Spouse Name: n/a  Number of Children: 0   Years of Education: N/A   Occupational History   server     [employer]   Social History Main Topics   Smoking status: Never Smoker    Smokeless tobacco: Never Used   Alcohol Use: No     Comment: previously used socially   Drug Use: No   Sexual Activity: Yes    Partners: Male    Birth Control/ Protection: Pill     Comment: 4 total sexual partners;  history of Chlamydia in 03/2012.   Other Topics Concern   Not on file   Social History Narrative   Marital status: single;  dating casually.        Children: none; never pregnant.      Lives: with mom; dad in [place].     Employment: works full time at [employer].      Education: previously in school; took medical leave in 02/2012; [place] in [place]; plans to return in Spring 2015.      Tobacco: vapor cigarette two months.       Alcohol:  None      Drugs: none      Sexual activity: sexually active; 4 total partners; males; Chlamydia in 03/2012.      Seatbelt: 100%       Objective:    BP 126/82   Pulse 96   Temp(Src) 98.9 F (37.2 C)   Resp 18   Ht 5' 3.5" (1.613 m)   Wt 241 lb (109.317 kg)   BMI 42.02 kg/m2   SpO2 100%   LMP 03/12/2013  Physical Exam  Nursing note and vitals reviewed. Constitutional: She is oriented to person, place, and time. She appears well-developed and well-nourished. No distress.  obese  HENT:  Head: Normocephalic and atraumatic.  Mouth/Throat: Oropharynx is clear and moist.  Eyes: Conjunctivae and EOM are normal. Pupils are equal, round, and reactive to light.  Neck: Normal range of motion. Neck supple. No thyromegaly present.  Cardiovascular: Normal rate, regular rhythm and normal heart sounds.  Exam reveals no friction rub.   No murmur heard. Pulmonary/Chest: Effort normal and breath sounds normal. She has no wheezes. She has no rales.  Musculoskeletal: Normal range of motion.  Lymphadenopathy:    She has no cervical adenopathy.  Neurological: She is alert and oriented to person, place, and time.  Skin: Skin is warm and dry. Rash noted. She is not diaphoretic.  Maculopapular rash with hyperpigmentation lower abdomen along beltline.  Similar rash mid back at braline.  Scaling erythematous rash B lateral breasts.  Mild rash abdomen and back diffusely scaling.   Psychiatric: She has a normal mood and affect. Her behavior is normal. Judgment and thought content  normal.   Results for orders placed in visit on 04/10/13  CBC WITH DIFFERENTIAL      Result Value Ref Range   WBC 7.1  4.0 - 10.5 K/uL   RBC 4.76  3.87 - 5.11 MIL/uL   Hemoglobin 12.3  12.0 - 15.0 g/dL   HCT 36.4  36.0 - 46.0 %   MCV 76.5 (*) 78.0 - 100.0 fL   MCH 25.8 (*) 26.0 - 34.0 pg   MCHC 33.8  30.0 - 36.0 g/dL   RDW 17.5 (*) 11.5 - 15.5 %   Platelets 338  150 - 400 K/uL   Neutrophils Relative % 67  43 - 77 %   Neutro Abs 4.8  1.7 - 7.7 K/uL   Lymphocytes Relative 22  12 -  46 %   Lymphs Abs 1.6  0.7 - 4.0 K/uL   Monocytes Relative 10  3 - 12 %   Monocytes Absolute 0.7  0.1 - 1.0 K/uL   Eosinophils Relative 1  0 - 5 %   Eosinophils Absolute 0.1  0.0 - 0.7 K/uL   Basophils Relative 0  0 - 1 %   Basophils Absolute 0.0  0.0 - 0.1 K/uL   Smear Review Criteria for review not met    COMPLETE METABOLIC PANEL WITH GFR      Result Value Ref Range   Sodium 137  135 - 145 mEq/L   Potassium 4.0  3.5 - 5.3 mEq/L   Chloride 103  96 - 112 mEq/L   CO2 27  19 - 32 mEq/L   Glucose, Bld 155 (*) 70 - 99 mg/dL   BUN 7  6 - 23 mg/dL   Creat 0.52  0.50 - 1.10 mg/dL   Total Bilirubin 0.4  0.2 - 1.2 mg/dL   Alkaline Phosphatase 42  39 - 117 U/L   AST 25  0 - 37 U/L   ALT 25  0 - 35 U/L   Total Protein 7.7  6.0 - 8.3 g/dL   Albumin 3.9  3.5 - 5.2 g/dL   Calcium 9.0  8.4 - 10.5 mg/dL   GFR, Est African American >89     GFR, Est Non African American >89    TSH      Result Value Ref Range   TSH 0.512  0.350 - 4.500 uIU/mL       Assessment & Plan:  Eczema - Plan: Ambulatory referral to Dermatology  Adjustment disorder with mixed anxiety and depressed mood  Contraception management  Cold intolerance - Plan: CBC with Differential, COMPLETE METABOLIC PANEL WITH GFR, TSH  Fatigue   1. Eczema: worsening; consistent with sensitivity to detergent; recommend switching to detergent for sensitive skin such as ALL or TIDE.  Continue Dove Soap; only use BBW lotion on extremities.  Cover  nickle on pants and bras with fabric.  Continue Triamcinolone cream bid to severe areas; refer to dermatology. 2.  Adjustment disorder with anxiety: improved/resolved; no longer warrants Prozac; emotionally stable without medication. 3.  Contraception management: stable; current vaginal itching consistent with dermatitis and less likely recurrent candidiasis; switch to detergent for sensitive skin.   4.  Cold intolerance and fatigue:  New. Obtain labs.  Recommend regular exercise, weight loss.   No orders of the defined types were placed in this encounter.    No Follow-up on file.    I personally performed the services described in this documentation, which was scribed in my presence.  The recorded information has been reviewed and is accurate.  Reginia Forts, M.D.  [facility] [address] [place], [place]  [address] [phone] phone [phone] fax

## 2013-04-10 NOTE — Patient Instructions (Signed)
1.  Continue Dove soap. 2. Bathe only once daily. 3.  Change to ALL  (white bottle) or Tide (White bottle) for sensitive skin. 4.  Apply Bath n Body Works only to legs, arms. 5. Deoderant:  Rub on unscented (Ban). Avoid roll on or sprays.   6. Apply prescription twice daily. 7.  Avoid bras with metal clasp.

## 2013-04-11 LAB — TSH: TSH: 0.512 u[IU]/mL (ref 0.350–4.500)

## 2013-05-03 ENCOUNTER — Ambulatory Visit (INDEPENDENT_AMBULATORY_CARE_PROVIDER_SITE_OTHER): Payer: 59 | Admitting: Family Medicine

## 2013-05-03 ENCOUNTER — Encounter: Payer: Self-pay | Admitting: Family Medicine

## 2013-05-03 VITALS — BP 124/80 | HR 91 | Temp 98.4°F | Resp 16 | Ht 63.0 in | Wt 238.8 lb

## 2013-05-03 DIAGNOSIS — N898 Other specified noninflammatory disorders of vagina: Secondary | ICD-10-CM

## 2013-05-03 DIAGNOSIS — Z01419 Encounter for gynecological examination (general) (routine) without abnormal findings: Secondary | ICD-10-CM

## 2013-05-03 DIAGNOSIS — IMO0002 Reserved for concepts with insufficient information to code with codable children: Secondary | ICD-10-CM

## 2013-05-03 DIAGNOSIS — B373 Candidiasis of vulva and vagina: Secondary | ICD-10-CM

## 2013-05-03 DIAGNOSIS — B3731 Acute candidiasis of vulva and vagina: Secondary | ICD-10-CM

## 2013-05-03 LAB — POCT WET PREP WITH KOH
KOH Prep POC: POSITIVE
RBC Wet Prep HPF POC: NEGATIVE
Trichomonas, UA: NEGATIVE
YEAST WET PREP PER HPF POC: NEGATIVE

## 2013-05-03 MED ORDER — FLUCONAZOLE 150 MG PO TABS: 150.0000 mg | ORAL_TABLET | Freq: Once | ORAL | Status: DC

## 2013-05-03 NOTE — Progress Notes (Signed)
Subjective:    Patient ID: Kelly Hunter, female    DOB: Jun 11, 1990, 23 y.o.   MRN: [id number]  05/03/2013  Vaginal Itching and blood work   HPI This 23 y.o. female presents for possible yeast infection and STD screening.  Onset one week ago.  +severe itching in vaginal area inside and outside.  +vaginal discharge white chunky.  No odor.  Had sex last week; +condoms.  Old/same partner x six months.  No abdominal pain, n/v/d.  No fever; no dysuria.  No previous pap smear and agreeable.  Compliance with OCP. No previous Gardisil series; not interested in [place].    Did switch detergents, soaps, etc after last visit to hypoallergenic products. Itching has greatly decreased.  Current symptoms different due to presence of vaginal discharge.  Review of Systems  Past Medical History  Diagnosis Date  . Allergy     Allegra  . Asthma     mild; onset in childhood.  No hospitalizations.  . Eczema   . Acne   . Hidradenitis    No Known Allergies Current Outpatient Prescriptions  Medication Sig Dispense Refill  . norgestimate-ethinyl estradiol (ORTHO-CYCLEN,SPRINTEC,PREVIFEM) 0.25-35 MG-MCG tablet Take 1 tablet by mouth daily.  1 Package  11  . Triamcinolone Acetonide (TRIAMCINOLONE 0.1 % CREAM : EUCERIN) CREA Use as directed  1 each  11  . fluconazole (DIFLUCAN) 150 MG tablet Take 1 tablet (150 mg total) by mouth once. Repeat if needed  2 tablet  0   No current facility-administered medications for this visit.   History   Social History  . Marital Status: Single    Spouse Name: n/a    Number of Children: 0  . Years of Education: N/A   Occupational History  . server     [employer]   Social History Main Topics  . Smoking status: Never Smoker   . Smokeless tobacco: Never Used  . Alcohol Use: No     Comment: previously used socially  . Drug Use: No  . Sexual Activity: Yes    Partners: Male    Birth Control/ Protection: Pill     Comment: 4 total sexual partners; history of  Chlamydia in 03/2012.   Other Topics Concern  . Not on file   Social History Narrative   Marital status: single;  dating casually.        Children: none; never pregnant.      Lives: with mom; dad in [place].     Employment: works full time at [employer].      Education: previously in school; took medical leave in 02/2012; [place] in [place]; plans to return in Spring 2015.      Tobacco: vapor cigarette two months.       Alcohol:  None      Drugs: none      Sexual activity: sexually active; 4 total partners; males; Chlamydia in 03/2012.      Seatbelt: 100%       Objective:    BP 124/80  Pulse 91  Temp(Src) 98.4 F (36.9 C) (Oral)  Resp 16  Ht 5\' 3"  (1.6 m)  Wt 238 lb 12.8 oz (108.319 kg)  BMI 42.31 kg/m2  SpO2 97%  LMP 04/09/2013 Physical Exam  Constitutional: She appears well-developed and well-nourished. No distress.  obese  Eyes: Conjunctivae and EOM are normal. Pupils are equal, round, and reactive to light.  Cardiovascular: Normal rate, regular rhythm and normal heart sounds.   Pulmonary/Chest: Effort normal and breath sounds  normal.  Abdominal: Soft. Bowel sounds are normal. She exhibits no distension and no mass. There is no tenderness. There is no rebound and no guarding.  Genitourinary: Uterus normal. There is no rash, tenderness or lesion on the right labia. There is no rash, tenderness or lesion on the left labia. Cervix exhibits no motion tenderness, no discharge and no friability. Right adnexum displays no mass, no tenderness and no fullness. Left adnexum displays no mass, no tenderness and no fullness. Vaginal discharge found.  Skin: Skin is warm and dry. No rash noted. She is not diaphoretic.   Results for orders placed in visit on 05/03/13  POCT WET PREP WITH KOH      Result Value Ref Range   Trichomonas, UA Negative     Clue Cells Wet Prep HPF POC 14-20     Epithelial Wet Prep HPF POC 14-20     Yeast Wet Prep HPF POC neg     Bacteria Wet Prep HPF POC 2+     RBC  Wet Prep HPF POC neg     WBC Wet Prep HPF POC tntc     KOH Prep POC Positive         Assessment & Plan:  Vaginal discharge - Plan: POCT Wet Prep with KOH, Pap IG, CT/NG w/ reflex HPV when ASC-U  Routine gynecological examination - Plan: Pap IG, CT/NG w/ reflex HPV when ASC-U  Candidiasis of vagina  1. Candidiasis vulvovaginal:  New. Rx for Diflucan provided. 2.  Gynecological exam: pap smear with GC/Chlam obtained; pt declined Gardisil series.  Continue OCP.  Continue condoms. 3. STD screening: GC/Chlam obtained; pt declined RPR, HIV testing.  Meds ordered this encounter  Medications  . fluconazole (DIFLUCAN) 150 MG tablet    Sig: Take 1 tablet (150 mg total) by mouth once. Repeat if needed    Dispense:  2 tablet    Refill:  0    No Follow-up on file.   Reginia Forts, M.D.  [facility] [address] [place], [place]  [address] [phone] phone [phone] fax

## 2013-05-03 NOTE — Patient Instructions (Signed)
Candida Infection, Adult A candida infection (also called yeast, fungus and Monilia infection) is an overgrowth of yeast that can occur anywhere on the body. A yeast infection commonly occurs in warm, moist body areas. Usually, the infection remains localized but can spread to become a systemic infection. A yeast infection may be a sign of a more severe disease such as diabetes, leukemia, or AIDS. A yeast infection can occur in both men and women. In women, Candida vaginitis is a vaginal infection. It is one of the most common causes of vaginitis. Men usually do not have symptoms or know they have an infection until other problems develop. Men may find out they have a yeast infection because their sex partner has a yeast infection. Uncircumcised men are more likely to get a yeast infection than circumcised men. This is because the uncircumcised glans is not exposed to air and does not remain as dry as that of a circumcised glans. Older adults may develop yeast infections around dentures. CAUSES  Women  Antibiotics.  Steroid medication taken for a long time.  Being overweight (obese).  Diabetes.  Poor immune condition.  Certain serious medical conditions.  Immune suppressive medications for organ transplant patients.  Chemotherapy.  Pregnancy.  Menstration.  Stress and fatigue.  Intravenous drug use.  Oral contraceptives.  Wearing tight-fitting clothes in the crotch area.  Catching it from a sex partner who has a yeast infection.  Spermicide.  Intravenous, urinary, or other catheters. Men  Catching it from a sex partner who has a yeast infection.  Having oral or anal sex with a person who has the infection.  Spermicide.  Diabetes.  Antibiotics.  Poor immune system.  Medications that suppress the immune system.  Intravenous drug use.  Intravenous, urinary, or other catheters. SYMPTOMS  Women  Thick, white vaginal discharge.  Vaginal itching.  Redness and  swelling in and around the vagina.  Irritation of the lips of the vagina and perineum.  Blisters on the vaginal lips and perineum.  Painful sexual intercourse.  Low blood sugar (hypoglycemia).  Painful urination.  Bladder infections.  Intestinal problems such as constipation, indigestion, bad breath, bloating, increase in gas, diarrhea, or loose stools. Men  Men may develop intestinal problems such as constipation, indigestion, bad breath, bloating, increase in gas, diarrhea, or loose stools.  Dry, cracked skin on the penis with itching or discomfort.  Jock itch.  Dry, flaky skin.  Athlete's foot.  Hypoglycemia. DIAGNOSIS  Women  A history and an exam are performed.  The discharge may be examined under a microscope.  A culture may be taken of the discharge. Men  A history and an exam are performed.  Any discharge from the penis or areas of cracked skin will be looked at under the microscope and cultured.  Stool samples may be cultured. TREATMENT  Women  Vaginal antifungal suppositories and creams.  Medicated creams to decrease irritation and itching on the outside of the vagina.  Warm compresses to the perineal area to decrease swelling and discomfort.  Oral antifungal medications.  Medicated vaginal suppositories or cream for repeated or recurrent infections.  Wash and dry the irritation areas before applying the cream.  Eating yogurt with lactobacillus may help with prevention and treatment.  Sometimes painting the vagina with gentian violet solution may help if creams and suppositories do not work. Men  Antifungal creams and oral antifungal medications.  Sometimes treatment must continue for 30 days after the symptoms go away to prevent recurrence. HOME CARE  INSTRUCTIONS  Women  Use cotton underwear and avoid tight-fitting clothing.  Avoid colored, scented toilet paper and deodorant tampons or pads.  Do not douche.  Keep your diabetes  under control.  Finish all the prescribed medications.  Keep your skin clean and dry.  Consume milk or yogurt with lactobacillus active culture regularly. If you get frequent yeast infections and think that is what the infection is, there are over-the-counter medications that you can get. If the infection does not show healing in 3 days, talk to your caregiver.  Tell your sex partner you have a yeast infection. Your partner may need treatment also, especially if your infection does not clear up or recurs. Men  Keep your skin clean and dry.  Keep your diabetes under control.  Finish all prescribed medications.  Tell your sex partner that you have a yeast infection so they can be treated if necessary. SEEK MEDICAL CARE IF:   Your symptoms do not clear up or worsen in one week after treatment.  You have an oral temperature above 102 F (38.9 C).  You have trouble swallowing or eating for a prolonged time.  You develop blisters on and around your vagina.  You develop vaginal bleeding and it is not your menstrual period.  You develop abdominal pain.  You develop intestinal problems as mentioned above.  You get weak or lightheaded.  You have painful or increased urination.  You have pain during sexual intercourse. MAKE SURE YOU:   Understand these instructions.  Will watch your condition.  Will get help right away if you are not doing well or get worse. Document Released: 02/25/2004 Document Revised: 04/11/2011 Document Reviewed: 06/08/2009 [facility] Patient Information 2014 [place].

## 2013-05-06 LAB — PAP IG, CT-NG, RFX HPV ASCU
CHLAMYDIA PROBE AMP: NEGATIVE
GC PROBE AMP: NEGATIVE

## 2013-05-07 LAB — HUMAN PAPILLOMAVIRUS, HIGH RISK: HPV DNA High Risk: DETECTED — AB

## 2013-05-16 NOTE — Addendum Note (Signed)
Addended by: [name] on: 05/16/2013 11:08 AM   Modules accepted: Orders

## 2013-06-04 ENCOUNTER — Ambulatory Visit (INDEPENDENT_AMBULATORY_CARE_PROVIDER_SITE_OTHER): Payer: 59 | Admitting: Gynecology

## 2013-06-04 ENCOUNTER — Encounter: Payer: Self-pay | Admitting: Gynecology

## 2013-06-04 VITALS — BP 124/70 | Ht 63.25 in | Wt 238.0 lb

## 2013-06-04 DIAGNOSIS — Z23 Encounter for immunization: Secondary | ICD-10-CM

## 2013-06-04 DIAGNOSIS — R6889 Other general symptoms and signs: Secondary | ICD-10-CM

## 2013-06-04 DIAGNOSIS — IMO0002 Reserved for concepts with insufficient information to code with codable children: Secondary | ICD-10-CM | POA: Insufficient documentation

## 2013-06-04 DIAGNOSIS — N888 Other specified noninflammatory disorders of cervix uteri: Secondary | ICD-10-CM

## 2013-06-04 NOTE — Progress Notes (Signed)
   Patient is a 23 year old was referred to our practice as a courtesy from Dr. Reginia Forts in reference to patient's recent abnormal Pap smear which demonstrated atypical squamous cells with high risk HPV. This is patient's first Pap smear. Patient is sexually active. Recent GC and chlamydia culture were negative. She is on oral contraceptive pill for contraception.  Patient was counseled for colposcopic evaluation. The external genitalia, perineum, and perirectal region were systematically inspected and it was noted that the area of the fourchette she had several raised white area suspicious for vaginal intraepithelial neoplasia or early condyloma. Also vaginal colposcopic exam demonstrated an acetowhite area at the 12:00 position of the ectocervix. Pictures as noted below:  Physical Exam  Genitourinary:        The as noted above were biopsied. The area of the fourchette one percent lidocaine was infiltrated at the base of one of the raised white areas and a keypunch biopsy was obtained. From the cervix at the 12:00 position with the use of a Kevorkian a biopsy was obtained as well as endocervical curettings. Monsel solution and silver nitrate were used for hemostasis.  Assessment/plan: 23 year old with atypical squamous cells of undetermined significance with high risk HPV on Pap smear was noted on colposcopic evaluation to have multiple small areas in the area of fourchette and suspicious for either condyloma or VIN. The cervix possible HPV changes with early dysplasia. All this was explained to the patient will wait for the pathology report and manage accordingly new guidelines.

## 2013-06-04 NOTE — Patient Instructions (Signed)
Human Papillomavirus (HPV) Gardasil Vaccine What You Need to Know WHAT IS HPV?  Genital human papillomavirus (HPV) is the most common sexually transmitted virus in the [place]. More than half of sexually active men and women are infected with HPV at some time in their lives.  About 20 million Americans are currently infected, and about 6 million more get infected each year. HPV is usually spread through sexual contact.  Most HPV infections do not cause any symptoms and go away on their own. But HPV can cause cervical cancer in women. Cervical cancer is the 2nd leading cause of cancer deaths among women around the world. In the [place], about 12,000 women get cervical cancer every year and about 4,000 are expected to die from it.  HPV is also associated with several less common cancers, such as vaginal and vulvar cancers in women, and anal and oropharyngeal (back of the throat, including base of tongue and tonsils) cancers in both men and women. HPV can also cause genital warts and warts in the throat.  There is no cure for HPV infection, but some of the problems it causes can be treated. HPV VACCINE: WHY GET VACCINATED?  The HPV vaccine you are getting is 1 of 2 vaccines that can be given to prevent HPV. It may be given to both males and females.  This vaccine can prevent most cases of cervical cancer in females, if it is given before exposure to the virus. In addition, it can prevent vaginal and vulvar cancer in females, and genital warts and anal cancer in both males and females.  Protection from HPV vaccine is expected to be long-lasting. But vaccination is not a substitute for cervical cancer screening. Women should still get regular Pap tests. WHO SHOULD GET THIS HPV VACCINE AND WHEN? HPV vaccine is given as a 3-dose series.  1st Dose: Now.  2nd Dose: 1 to 2 months after Dose 1.  3rd Dose: 6 months after Dose 1. Additional (booster) doses are not recommended. Routine  Vaccination This HPV vaccine is recommended for girls and boys 7 or 21 years of age. It may be given starting at age 72. Why is HPV vaccine recommended at 59 or 21 years of age?  HPV infection is easily acquired, even with only one sex partner. That is why it is important to get HPV vaccine before any sexual contact takes place. Also, response to the vaccine is better at this age than at older ages. Catch-Up Vaccination This vaccine is recommended for the following people who have not completed the 3-dose series:   Females 60 through 21 years of age.  Males 29 through 21 years of age. This vaccine may be given to men 63 through 21 years of age who have not completed the 3-dose series. It is recommended for men through age 81 who have sex with men or whose immune system is weakened because of HIV infection, other illness, or medications.  HPV vaccine may be given at the same time as other vaccines. SOME PEOPLE SHOULD NOT GET HPV VACCINE OR SHOULD WAIT  Anyone who has ever had a life-threatening allergic reaction to any other component of HPV vaccine, or to a previous dose of HPV vaccine, should not get the vaccine. Tell your doctor if the person getting vaccinated has any severe allergies, including an allergy to yeast.  HPV vaccine is not recommended for pregnant women. However, receiving HPV vaccine when pregnant is not a reason to consider terminating the pregnancy.  Women who are breastfeeding may get the vaccine.  People who are mildly ill when a dose of HPV is planned can still be vaccinated. People with a moderate or severe illness should wait until they are better. WHAT ARE THE RISKS FROM THIS VACCINE?  This HPV vaccine has been used in the [place] and around the world for about 6 years and has been very safe.  However, any medicine could possibly cause a serious problem, such as a severe allergic reaction. The risk of any vaccine causing a serious injury, or death, is extremely  small.  Life-threatening allergic reactions from vaccines are very rare. If they do occur, it would be within a few minutes to a few hours after the vaccination. Several mild to moderate problems are known to occur with HPV vaccine. These do not last long and go away on their own.  Reactions in the arm where the shot was given:  Pain (about 8 people in 10).  Redness or swelling (about 1 person in 4).  Fever:  Mild (100 F or 37.8 C) (about 1 person in 10).  Moderate (102 F or 38.9 C) (about 1 person in 80).  Other problems:  Headache (about 1 person in 3).  Fainting: Brief fainting spells and related symptoms (such as jerking movements) can happen after any medical procedure, including vaccination. Sitting or lying down for about 15 minutes after a vaccination can help prevent fainting and injuries caused by falls. Tell your doctor if the patient feels dizzy or lightheaded, or has vision changes or ringing in the ears.  Like all vaccines, HPV vaccines will continue to be monitored for unusual or severe problems. WHAT IF THERE IS A SERIOUS REACTION? What should I look for?  Any unusual condition, such as a high fever or unusual behavior. Signs of a serious allergic reaction can include difficulty breathing, hoarseness or wheezing, hives, paleness, weakness, a fast heartbeat, or dizziness. What should I do?  Call a doctor, or get the person to a doctor right away.  Tell your doctor what happened, the date and time it happened, and when the vaccination was given.  Ask your doctor, nurse, or health department to report the reaction by filing a Vaccine Adverse Event Reporting System (VAERS) form. Or, you can file this report through the VAERS website at www.vaers.SamedayNews.es or by calling (812) 018-6003. VAERS does not provide medical advice. THE NATIONAL VACCINE INJURY COMPENSATION PROGRAM  The National Vaccine Injury Compensation Program (VICP) is a federal program that was created  to compensate people who may have been injured by certain vaccines.  Persons who believe they may have been injured by a vaccine can learn about the program and about filing a claim by calling 989-763-8516 or visiting the [place] website at GoldCloset.com.ee [place]?  Ask your doctor.  Call your local or state health department.  Contact the Centers for Disease Control and Prevention (CDC):  Call 410-016-9508 (1-800-CDC-INFO)  or  Visit CDC's website at http://hunter.com/ CDC Human Papillomavirus (HPV) Gardasil (Interim) 06/17/11 Document Released: 11/14/2005 Document Revised: 11/07/2012 Document Reviewed: 05/08/2012 [facility] Patient Information 2014 [place]. Colposcopy Colposcopy is a procedure to examine your cervix and vagina, or the area around the outside of your vagina, for abnormalities or signs of disease. The procedure is done using a lighted microscope called a colposcope. Tissue samples may be collected during the colposcopy if your health care provider finds any unusual cells. A colposcopy may be done if a woman has:  An abnormal Pap test. A Pap test is a medical test done to evaluate cells that are on the surface of the cervix.  A Pap test result that is suggestive of human papillomavirus (HPV). This virus can cause genital warts and is linked to the development of cervical cancer.  A sore on her cervix and the results of a Pap test were normal.  Genital warts on the cervix or in or around the outside of the vagina.  A mother who took the drug diethylstilbestrol (DES) while pregnant.  Painful intercourse.  Vaginal bleeding, especially after sexual intercourse. LET [facility] CARE PROVIDER KNOW ABOUT:  Any allergies you have.  All medicines you are taking, including vitamins, herbs, eye drops, creams, and over-the-counter medicines.  Previous problems you or members of your family have had with the use of anesthetics.  Any  blood disorders you have.  Previous surgeries you have had.  Medical conditions you have. RISKS AND COMPLICATIONS Generally, a colposcopy is a safe procedure. However, as with any procedure, complications can occur. Possible complications include:  Bleeding.  Infection.  Missed lesions. BEFORE THE PROCEDURE   Tell your health care provider if you have your menstrual period. A colposcopy typically is not done during menstruation.  For 24 hours before the colposcopy, do not:  D[place]che.  Use tampons.  Use medicines, creams, or suppositories in the vagina.  Have sexual intercourse. PROCEDURE  During the procedure, you will be lying on your back with your feet in foot rests (stirrups). A warm metal or plastic instrument (speculum) will be placed in your vagina to keep it open and to allow the health care provider to see the cervix. The colposcope will be placed outside the vagina. It will be used to magnify and examine the cervix, vagina, and the area around the outside of the vagina. A small amount of liquid solution will be placed on the area that is to be viewed. This solution will make it easier to see the abnormal cells. Your health care provider will use tools to suck out mucus and cells from the canal of the cervix. Then he or she will record the location of the abnormal areas. If a biopsy is done during the procedure, a medicine will usually be given to numb the area (local anesthetic). You may feel mild pain or cramping while the biopsy is done. After the procedure, tissue samples collected during the biopsy will be sent to a lab for analysis. AFTER THE PROCEDURE  You will be given instructions on when to follow up with your health care provider for your test results. It is important to keep your appointment. Document Released: 04/09/2002 Document Revised: 09/19/2012 Document Reviewed: 08/16/2012 [facility] Patient Information 2014 [place], [place].

## 2013-06-18 ENCOUNTER — Ambulatory Visit (INDEPENDENT_AMBULATORY_CARE_PROVIDER_SITE_OTHER): Payer: 59 | Admitting: Gynecology

## 2013-06-18 ENCOUNTER — Encounter: Payer: Self-pay | Admitting: Gynecology

## 2013-06-18 VITALS — BP 128/84

## 2013-06-18 DIAGNOSIS — N900 Mild vulvar dysplasia: Secondary | ICD-10-CM | POA: Insufficient documentation

## 2013-06-18 DIAGNOSIS — A630 Anogenital (venereal) warts: Secondary | ICD-10-CM | POA: Insufficient documentation

## 2013-06-18 DIAGNOSIS — N870 Mild cervical dysplasia: Secondary | ICD-10-CM

## 2013-06-18 DIAGNOSIS — Z01818 Encounter for other preprocedural examination: Secondary | ICD-10-CM

## 2013-06-18 HISTORY — DX: Mild cervical dysplasia: N87.0

## 2013-06-18 HISTORY — DX: Anogenital (venereal) warts: A63.0

## 2013-06-18 NOTE — Progress Notes (Signed)
 Kelly Hunter is an 23 y.o. female. Presented to the office today to discuss her recent cervical and vulvar biopsy as a result of a referral from Dr. Kristi Smith do to her Pap smear demonstrating atypical squamous cells with high risk HPV. Patient had a recent GC Chlamydia culture that was negative and she is on oral contraceptives. Patient does have history of having completed the HPV vaccine.  On 06/04/2013 patient underwent a detail colposcopic evaluation of the external genitalia and vagina. She was noted to have several condylomas at the area of the fourchette as well as acetowhite lesions on the cervix near the transformation zone at the 12:00 position. Pathology report as follows:  Diagnosis 1. Endocervix, curettage, biopsy - BENIGN ENDOCERVICAL MUCOSA. - DETACHED FRAGMENTS OF SQUAMOUS EPITHELIUM; NEGATIVE FOR INTRAEPITHELIAL LESION OR MALIGNANCY. 2. Cervix, biopsy, 12 o'clock biopsy - LOW GRADE SQUAMOUS INTRAEPITHELIAL LESION, CIN-I (MILD DYSPLASIA). 3. Vulva, biopsy, fourchette biopsy - LOW GRADE VULVAR SQUAMOUS INTRAEPITHELIAL LESION (VIN-I, MILD DYSPLASIA, CONDYLOMA).  The above findings were discussed today with the patient and she will be best treated with CO2 laser of the cervix as well as the extensive amount of condylomas at the area of the fourchette. (Both CIN-1 and VIN 1)   Pertinent Gynecological History: Menses: regular Bleeding: regular Contraception: OCP (estrogen/progesterone) DES exposure: unknown Blood transfusions: none Sexually transmitted diseases: chlamydia Previous GYN Procedures: no  Last mammogram: abnormal: see above Date: not indicated Last pap:See above Date: 2015 OB History: G0, P0   Menstrual History: Menarche age: 12  Patient's last menstrual period was 06/18/2013.    Past Medical History  Diagnosis Date  . Allergy     Allegra  . Asthma     mild; onset in childhood.  No hospitalizations.  . Eczema   . Acne   . Hidradenitis       Past Surgical History  Procedure Laterality Date  . Breast surgery  02/01/2008    Reduction    Family History  Problem Relation Age of Onset  . Diabetes Mother   . Hypertension Mother   . Hyperlipidemia Mother   . Cancer Maternal Grandfather     ?    Social History:  reports that she has never smoked. She has never used smokeless tobacco. She reports that she does not drink alcohol or use illicit drugs.  Allergies: No Known Allergies   (Not in a hospital admission)  REVIEW OF SYSTEMS: A ROS was performed and pertinent positives and negatives are included in the history.  GENERAL: No fevers or chills. HEENT: No change in vision, no earache, sore throat or sinus congestion. NECK: No pain or stiffness. CARDIOVASCULAR: No chest pain or pressure. No palpitations. PULMONARY: No shortness of breath, cough or wheeze. GASTROINTESTINAL: No abdominal pain, nausea, vomiting or diarrhea, melena or bright red blood per rectum. GENITOURINARY: No urinary frequency, urgency, hesitancy or dysuria. MUSCULOSKELETAL: No joint or muscle pain, no back pain, no recent trauma. DERMATOLOGIC: No rash, no itching, no lesions. ENDOCRINE: No polyuria, polydipsia, no heat or cold intolerance. No recent change in weight. HEMATOLOGICAL: No anemia or easy bruising or bleeding. NEUROLOGIC: No headache, seizures, numbness, tingling or weakness. PSYCHIATRIC: No depression, no loss of interest in normal activity or change in sleep pattern.     Blood pressure 128/84, last menstrual period 06/18/2013.  Physical Exam:  HEENT:unremarkable Neck:Supple, midline, no thyroid megaly, no carotid bruits Lungs:  Clear to auscultation no rhonchi's or wheezes Heart:Regular rate and rhythm, no murmurs or gallops Breast   Exam: Not done Abdomen: Soft nontender no rebound or guarding Pelvic:BUS within normal limits, and the fourchette with numerous condylomas Vagina: Menstrual blood present Cervix: As described above Uterus:  Anteverted normal size shape and consistency Adnexa: No palpable masses or tenderness Extremities: No cords, no edema Rectal: Not done   Assessment/Plan: 23-year-old with CIN-1 and VIN 1 along with extensive condylomas at the area of the fourchette be treated with CO2 laser ablation and a surgical outpatient setting. The risks benefits and pros and cons of the operation were discussed with the patient as outlined below. Literature information was previously provided.                        Patient was counseled as to the risk of surgery to include the following:  1. Infection (prohylactic antibiotics will be administered)  2. DVT/Pulmonary Embolism (prophylactic pneumo compression stockings will be used)  3.Trauma to internal organs requiring additional surgical procedure to repair any injury to     Internal organs requiring perhaps additional hospitalization days.  4.Hemmorhage requiring transfusion and blood products which carry risks such as  anaphylactic reaction, hepatitis and AIDS  Patient had received literature information on the procedure scheduled and all her questions were answered and fully accepts all risk.   Altie Savard H FernandezMD4:23 PMTD@Note: This dictation was prepared with  Dragon/digital dictation along withSmart phrase technology. Any transcriptional errors that result from this process are unintentional.      Nehan Flaum H Breylan Lefevers 06/18/2013, 4:03 PM  Note: This dictation was prepared with  Dragon/digital dictation along withSmart phrase technology. Any transcriptional errors that result from this process are unintentional.  

## 2013-07-11 ENCOUNTER — Encounter (HOSPITAL_COMMUNITY): Payer: Self-pay | Admitting: Pharmacist

## 2013-07-17 MED ORDER — DEXTROSE 5 % IV SOLN
2.0000 g | INTRAVENOUS | Status: AC
  Administered 2013-07-18: 2 g via INTRAVENOUS
  Filled 2013-07-17: qty 2

## 2013-07-18 ENCOUNTER — Other Ambulatory Visit: Payer: Self-pay | Admitting: Gynecology

## 2013-07-18 ENCOUNTER — Encounter (HOSPITAL_COMMUNITY): Payer: 59 | Admitting: Anesthesiology

## 2013-07-18 ENCOUNTER — Encounter (HOSPITAL_COMMUNITY): Payer: Self-pay

## 2013-07-18 ENCOUNTER — Ambulatory Visit (HOSPITAL_COMMUNITY)
Admission: RE | Admit: 2013-07-18 | Discharge: 2013-07-18 | Disposition: A | Discharge status: Home / Self Care | Payer: 59 | Source: Ambulatory Visit | Attending: Gynecology | Admitting: Gynecology

## 2013-07-18 ENCOUNTER — Encounter (HOSPITAL_COMMUNITY)
Admission: RE | Disposition: A | Discharge status: Home / Self Care | Payer: Self-pay | Source: Ambulatory Visit | Attending: Gynecology

## 2013-07-18 ENCOUNTER — Ambulatory Visit (HOSPITAL_COMMUNITY): Payer: 59 | Admitting: Anesthesiology

## 2013-07-18 ENCOUNTER — Telehealth: Payer: Self-pay | Admitting: *Deleted

## 2013-07-18 DIAGNOSIS — N900 Mild vulvar dysplasia: Secondary | ICD-10-CM | POA: Insufficient documentation

## 2013-07-18 DIAGNOSIS — K219 Gastro-esophageal reflux disease without esophagitis: Secondary | ICD-10-CM | POA: Insufficient documentation

## 2013-07-18 DIAGNOSIS — Z6841 Body Mass Index (BMI) 40.0 and over, adult: Secondary | ICD-10-CM | POA: Insufficient documentation

## 2013-07-18 DIAGNOSIS — N870 Mild cervical dysplasia: Secondary | ICD-10-CM

## 2013-07-18 DIAGNOSIS — Z9889 Other specified postprocedural states: Secondary | ICD-10-CM

## 2013-07-18 DIAGNOSIS — B079 Viral wart, unspecified: Secondary | ICD-10-CM | POA: Insufficient documentation

## 2013-07-18 DIAGNOSIS — J45909 Unspecified asthma, uncomplicated: Secondary | ICD-10-CM | POA: Insufficient documentation

## 2013-07-18 HISTORY — PX: CO2 LASER APPLICATION: SHX5778

## 2013-07-18 LAB — CBC
HCT: 36 % (ref 36.0–46.0)
Hemoglobin: 12.8 g/dL (ref 12.0–15.0)
MCH: 26.9 pg (ref 26.0–34.0)
MCHC: 35.6 g/dL (ref 30.0–36.0)
MCV: 75.6 fL — ABNORMAL LOW (ref 78.0–100.0)
PLATELETS: 376 10*3/uL (ref 150–400)
RBC: 4.76 MIL/uL (ref 3.87–5.11)
RDW: 14.9 % (ref 11.5–15.5)
WBC: 8.2 10*3/uL (ref 4.0–10.5)

## 2013-07-18 LAB — URINALYSIS, ROUTINE W REFLEX MICROSCOPIC
Bilirubin Urine: NEGATIVE
Glucose, UA: NEGATIVE mg/dL
Ketones, ur: NEGATIVE mg/dL
LEUKOCYTES UA: NEGATIVE
Nitrite: NEGATIVE
PH: 6 (ref 5.0–8.0)
Protein, ur: NEGATIVE mg/dL
UROBILINOGEN UA: 0.2 mg/dL (ref 0.0–1.0)

## 2013-07-18 LAB — PREGNANCY, URINE: Preg Test, Ur: NEGATIVE

## 2013-07-18 LAB — URINE MICROSCOPIC-ADD ON

## 2013-07-18 SURGERY — CO2 LASER APPLICATION
Anesthesia: General | Site: Perineum

## 2013-07-18 MED ORDER — ONDANSETRON HCL 4 MG/2ML IJ SOLN
INTRAMUSCULAR | Status: DC | PRN
  Administered 2013-07-18: 4 mg via INTRAVENOUS

## 2013-07-18 MED ORDER — LIDOCAINE HCL (CARDIAC) 20 MG/ML IV SOLN
INTRAVENOUS | Status: DC | PRN
  Administered 2013-07-18: 50 mg via INTRAVENOUS

## 2013-07-18 MED ORDER — FERRIC SUBSULFATE 259 MG/GM EX SOLN
CUTANEOUS | Status: AC
  Filled 2013-07-18: qty 8

## 2013-07-18 MED ORDER — PROPOFOL 10 MG/ML IV BOLUS
INTRAVENOUS | Status: DC | PRN
  Administered 2013-07-18: 250 mg via INTRAVENOUS
  Administered 2013-07-18: 50 mg via INTRAVENOUS

## 2013-07-18 MED ORDER — FENTANYL CITRATE 0.05 MG/ML IJ SOLN
INTRAMUSCULAR | Status: AC
  Filled 2013-07-18: qty 2

## 2013-07-18 MED ORDER — SILVER SULFADIAZINE 1 % EX CREA
TOPICAL_CREAM | CUTANEOUS | Status: AC
  Filled 2013-07-18: qty 50

## 2013-07-18 MED ORDER — CLINDAMYCIN PHOSPHATE 2 % VA CREA
TOPICAL_CREAM | VAGINAL | Status: AC
  Filled 2013-07-18: qty 40

## 2013-07-18 MED ORDER — MIDAZOLAM HCL 2 MG/2ML IJ SOLN
INTRAMUSCULAR | Status: AC
  Filled 2013-07-18: qty 2

## 2013-07-18 MED ORDER — FERRIC SUBSULFATE SOLN
Status: DC | PRN
  Administered 2013-07-18: 1 via TOPICAL

## 2013-07-18 MED ORDER — LIDOCAINE HCL (CARDIAC) 20 MG/ML IV SOLN
INTRAVENOUS | Status: AC
  Filled 2013-07-18: qty 5

## 2013-07-18 MED ORDER — MIDAZOLAM HCL 2 MG/2ML IJ SOLN
INTRAMUSCULAR | Status: DC | PRN
  Administered 2013-07-18: 2 mg via INTRAVENOUS

## 2013-07-18 MED ORDER — IODINE STRONG (LUGOLS) 5 % PO SOLN
ORAL | Status: AC
  Filled 2013-07-18: qty 1

## 2013-07-18 MED ORDER — FENTANYL CITRATE 0.05 MG/ML IJ SOLN
INTRAMUSCULAR | Status: AC
  Administered 2013-07-18: 50 ug via INTRAVENOUS
  Filled 2013-07-18: qty 2

## 2013-07-18 MED ORDER — FENTANYL CITRATE 0.05 MG/ML IJ SOLN
INTRAMUSCULAR | Status: DC | PRN
  Administered 2013-07-18 (×2): 50 ug via INTRAVENOUS
  Administered 2013-07-18: 100 ug via INTRAVENOUS

## 2013-07-18 MED ORDER — PROMETHAZINE HCL 25 MG/ML IJ SOLN
6.2500 mg | INTRAMUSCULAR | Status: DC | PRN
  Administered 2013-07-18: 12.5 mg via INTRAVENOUS

## 2013-07-18 MED ORDER — SILVER SULFADIAZINE 1 % EX CREA
TOPICAL_CREAM | CUTANEOUS | Status: DC | PRN
  Administered 2013-07-18: 1 via TOPICAL

## 2013-07-18 MED ORDER — OXYCODONE-ACETAMINOPHEN 2.5-325 MG PO TABS
1.0000 | ORAL_TABLET | ORAL | Status: DC | PRN
Start: 2013-07-18 — End: 2013-07-18

## 2013-07-18 MED ORDER — OXYCODONE-ACETAMINOPHEN 5-325 MG PO TABS: 1.0000 | ORAL_TABLET | ORAL | Status: DC | PRN

## 2013-07-18 MED ORDER — KETOROLAC TROMETHAMINE 30 MG/ML IJ SOLN
INTRAMUSCULAR | Status: DC | PRN
Start: 2013-07-18 — End: 2013-07-18
  Administered 2013-07-18: 30 mg via INTRAVENOUS

## 2013-07-18 MED ORDER — MEPERIDINE HCL 25 MG/ML IJ SOLN: 6.2500 mg | INTRAMUSCULAR | Status: DC | PRN

## 2013-07-18 MED ORDER — KETOROLAC TROMETHAMINE 30 MG/ML IJ SOLN
INTRAMUSCULAR | Status: AC
  Filled 2013-07-18: qty 1

## 2013-07-18 MED ORDER — ONDANSETRON HCL 4 MG/2ML IJ SOLN
INTRAMUSCULAR | Status: AC
  Filled 2013-07-18: qty 2

## 2013-07-18 MED ORDER — ACETIC ACID 5 % SOLN
Status: AC
  Filled 2013-07-18: qty 500

## 2013-07-18 MED ORDER — ESTRADIOL 0.1 MG/GM VA CREA
TOPICAL_CREAM | VAGINAL | Status: AC
  Filled 2013-07-18: qty 42.5

## 2013-07-18 MED ORDER — PROMETHAZINE HCL 25 MG/ML IJ SOLN
INTRAMUSCULAR | Status: AC
  Filled 2013-07-18: qty 1

## 2013-07-18 MED ORDER — VASOPRESSIN 20 UNIT/ML IJ SOLN
INTRAMUSCULAR | Status: AC
  Filled 2013-07-18: qty 1

## 2013-07-18 MED ORDER — SILVER NITRATE-POT NITRATE 75-25 % EX MISC
CUTANEOUS | Status: AC
  Filled 2013-07-18: qty 1

## 2013-07-18 MED ORDER — PROPOFOL 10 MG/ML IV EMUL
INTRAVENOUS | Status: AC
  Filled 2013-07-18: qty 40

## 2013-07-18 MED ORDER — ACETIC ACID 5 % SOLN
Status: DC | PRN
  Administered 2013-07-18: 1 via TOPICAL

## 2013-07-18 MED ORDER — LIDOCAINE-EPINEPHRINE 1 %-1:100000 IJ SOLN
INTRAMUSCULAR | Status: AC
  Filled 2013-07-18: qty 1

## 2013-07-18 MED ORDER — METRONIDAZOLE 0.75 % VA GEL: VAGINAL | Status: DC

## 2013-07-18 MED ORDER — LACTATED RINGERS IV SOLN: INTRAVENOUS | Status: DC

## 2013-07-18 MED ORDER — FENTANYL CITRATE 0.05 MG/ML IJ SOLN
INTRAMUSCULAR | Status: AC
  Filled 2013-07-18: qty 5

## 2013-07-18 MED ORDER — PHENYLEPHRINE HCL 10 MG/ML IJ SOLN
INTRAMUSCULAR | Status: DC | PRN
  Administered 2013-07-18 (×3): 40 ug via INTRAVENOUS

## 2013-07-18 MED ORDER — DEXAMETHASONE SODIUM PHOSPHATE 10 MG/ML IJ SOLN
INTRAMUSCULAR | Status: AC
  Filled 2013-07-18: qty 1

## 2013-07-18 MED ORDER — LACTATED RINGERS IV SOLN
INTRAVENOUS | Status: DC
  Administered 2013-07-18 (×2): via INTRAVENOUS

## 2013-07-18 MED ORDER — FENTANYL CITRATE 0.05 MG/ML IJ SOLN
25.0000 ug | INTRAMUSCULAR | Status: DC | PRN
  Administered 2013-07-18: 50 ug via INTRAVENOUS

## 2013-07-18 MED ORDER — METOCLOPRAMIDE HCL 10 MG PO TABS: 10.0000 mg | ORAL_TABLET | Freq: Three times a day (TID) | ORAL | Status: DC

## 2013-07-18 MED ORDER — DEXAMETHASONE SODIUM PHOSPHATE 10 MG/ML IJ SOLN
INTRAMUSCULAR | Status: DC | PRN
Start: 2013-07-18 — End: 2013-07-18
  Administered 2013-07-18: 6 mg via INTRAVENOUS

## 2013-07-18 SURGICAL SUPPLY — 24 items
APPLICATOR COTTON TIP 6IN STRL (MISCELLANEOUS) ×3 IMPLANT
BLADE SURG 15 STRL LF C SS BP (BLADE) ×1 IMPLANT
BLADE SURG 15 STRL SS (BLADE) ×3
CANISTER SUCTION 1200CC (MISCELLANEOUS) IMPLANT
CATH ROBINSON RED A/P 16FR (CATHETERS) ×3 IMPLANT
CLOTH BEACON ORANGE TIMEOUT ST (SAFETY) ×3 IMPLANT
DEPRESSOR TONGUE BLADE STERILE (MISCELLANEOUS) ×6 IMPLANT
DRAPE LG THREE QUARTER DISP (DRAPES) ×3 IMPLANT
ELECT BALL LEEP 3MM BLK (ELECTRODE) ×3 IMPLANT
ELECT REM PT RETURN 9FT ADLT (ELECTROSURGICAL) ×3
ELECTRODE REM PT RTRN 9FT ADLT (ELECTROSURGICAL) ×1 IMPLANT
GLOVE ECLIPSE 7.5 STRL STRAW (GLOVE) ×3 IMPLANT
GLOVE INDICATOR 8.0 STRL GRN (GLOVE) ×6 IMPLANT
GOWN STRL REUS W/TWL LRG LVL3 (GOWN DISPOSABLE) ×6 IMPLANT
PACK VAGINAL MINOR WOMEN LF (CUSTOM PROCEDURE TRAY) ×3 IMPLANT
PAD OB MATERNITY 4.3X12.25 (PERSONAL CARE ITEMS) ×3 IMPLANT
PAD PREP 24X48 CUFFED NSTRL (MISCELLANEOUS) ×3 IMPLANT
PENCIL BUTTON HOLSTER BLD 10FT (ELECTRODE) ×3 IMPLANT
SCOPETTES 8  STERILE (MISCELLANEOUS) ×4
SCOPETTES 8 STERILE (MISCELLANEOUS) ×2 IMPLANT
SUT VIC AB 3-0 FS2 27 (SUTURE) ×6 IMPLANT
SUT VIC AB 3-0 X1 27 (SUTURE) IMPLANT
TOWEL OR 17X24 6PK STRL BLUE (TOWEL DISPOSABLE) ×6 IMPLANT
YANKAUER SUCT BULB TIP NO VENT (SUCTIONS) IMPLANT

## 2013-07-18 NOTE — Telephone Encounter (Signed)
THAT WILL BE FINE

## 2013-07-18 NOTE — Discharge Instructions (Signed)
Conization of the Cervix, Care After  Refer to this sheet in the next few weeks. These instructions provide you with information on caring for yourself after your procedure. Your health care provider may also give you more specific instructions. Your treatment has been planned according to current medical practices but problems sometimes occur. Call your health care provider if you have any problems or questions after your procedure.    WHAT TO EXPECT AFTER THE PROCEDURE  MAY TAKE IBUPROFEN (MOTRIN, ADVIL) OR ALEVE AFTER 2:00 PM FOR CRAMPS!!!! After your procedure, it is typical to have the following sensations:  If you had a general anesthetic, you may be groggy for 2-3 hours after the procedure.  You may have cramps (similar to menstrual cramps) for about 1 week.  You may have a bloody discharge or light to moderate bleeding for 1-2 weeks. The bleeding should not be heavy (for example, it should not soak 1 pad in less than 1 hour).  You may have a black vaginal discharge that looks similar to coffee grounds. This is from the paste that was applied to the cervix to control bleeding. This is normal. Recovery may take up to 3 weeks.  HOME CARE INSTRUCTIONS  Arrange for someone to drive you home after the procedure.  Only take medicines as directed by your health care provider. Do not take aspirin. It can cause bleeding.  Take showers for the first week. Do not take baths, swim, or use hot tubs until your health care provider says it is okay.  Do not douche, use tampons, or have sexual intercourse until your health care provider says it is okay.  Avoid strenuous activities, exercises, and heavy lifting for at least 7-14 days.  You may resume your normal diet unless your health care provider advises you differently.  If you are constipated, you may:  Take a mild laxative as directed by your health care provider.  Add fruit and bran to your diet.  Make sure to drink enough fluids to keep your urine  clear or pale yellow. Keep follow-up appointments with your health care provider. SEEK MEDICAL CARE IF:  You develop a rash.  You are dizzy or lightheaded.  You feel nauseous.  You develop a bad smelling vaginal discharge. SEEK IMMEDIATE MEDICAL CARE IF:  You have blood clots or bleeding that is heavier than a normal menstrual period (for example, soaking a pad in less than 1 hour) or you develop bright red bleeding.  You have a fever over 101F (38.3C) or persistent symptoms for more than 2-3 days.  You have a fever over 101F (38.3C) and your symptoms suddenly get worse.  You have increasing cramps.  You faint.  You have pain when urinating.  You have bloody urine.  You start vomiting.  Your pain is not relieved with your medicine.  Your have severe or worsening pain. MAKE SURE YOU:  Understand these instructions.  Will watch your condition.  Will get help right away if you are not doing well or get worse. Document Released: 01/17/2005 Document Revised: 01/22/2013 Document Reviewed: 07/13/2012  [facility] Patient Information 2015 [place], [place]. This information is not intended to replace advice given to you by your health care provider. Make sure you discuss any questions you have with your health care provider.

## 2013-07-18 NOTE — Telephone Encounter (Signed)
Pt will come pick up Rx, I left directions the same as the below Rx.

## 2013-07-18 NOTE — Interval H&P Note (Signed)
History and Physical Interval Note:  07/18/2013 7:08 AM  Kelly Hunter  has presented today for surgery, with the diagnosis of vaginal dysplasia, condyloma  The various methods of treatment have been discussed with the patient and family. After consideration of risks, benefits and other options for treatment, the patient has consented to  Procedure(s): C02 laser of vagina and cervix (N/A) as a surgical intervention .  The patient's history has been reviewed, patient examined, no change in status, stable for surgery.  I have reviewed the patient's chart and labs.  Questions were answered to the patient's satisfaction.     Terrance Mass

## 2013-07-18 NOTE — Transfer of Care (Signed)
Immediate Anesthesia Transfer of Care Note  Patient: Kelly Hunter  Procedure(s) Performed: Procedure(s): C02 laser of vagina and cervix (N/A)  Patient Location: PACU  Anesthesia Type:General  Level of Consciousness: awake, alert  and oriented  Airway & Oxygen Therapy: Patient Spontanous Breathing and Patient connected to nasal cannula oxygen  Post-op Assessment: Report given to PACU RN and Post -op Vital signs reviewed and stable  Post vital signs: Reviewed and stable  Complications: No apparent anesthesia complications

## 2013-07-18 NOTE — H&P (View-Only) (Signed)
Kelly Hunter is an 23 y.o. female. Presented to the office today to discuss her recent cervical and vulvar biopsy as a result of a referral from Dr. Reginia Forts do to her Pap smear demonstrating atypical squamous cells with high risk HPV. Patient had a recent GC Chlamydia culture that was negative and she is on oral contraceptives. Patient does have history of having completed the HPV vaccine.  On 06/04/2013 patient underwent a detail colposcopic evaluation of the external genitalia and vagina. She was noted to have several condylomas at the area of the fourchette as well as acetowhite lesions on the cervix near the transformation zone at the 12:00 position. Pathology report as follows:  Diagnosis 1. Endocervix, curettage, biopsy - BENIGN ENDOCERVICAL MUCOSA. - DETACHED FRAGMENTS OF SQUAMOUS EPITHELIUM; NEGATIVE FOR INTRAEPITHELIAL LESION OR MALIGNANCY. 2. Cervix, biopsy, 12 o'clock biopsy - LOW GRADE SQUAMOUS INTRAEPITHELIAL LESION, CIN-I (MILD DYSPLASIA). 3. Vulva, biopsy, fourchette biopsy - LOW GRADE VULVAR SQUAMOUS INTRAEPITHELIAL LESION (VIN-I, MILD DYSPLASIA, CONDYLOMA).  The above findings were discussed today with the patient and she will be best treated with CO2 laser of the cervix as well as the extensive amount of condylomas at the area of the fourchette. (Both CIN-1 and VIN 1)   Pertinent Gynecological History: Menses: regular Bleeding: regular Contraception: OCP (estrogen/progesterone) DES exposure: unknown Blood transfusions: none Sexually transmitted diseases: chlamydia Previous GYN Procedures: no  Last mammogram: abnormal: see above Date: not indicated Last pap:See above Date: 33 OB History: G0, P0   Menstrual History: Menarche age: 49  Patient's last menstrual period was 06/18/2013.    Past Medical History  Diagnosis Date  . Allergy     Allegra  . Asthma     mild; onset in childhood.  No hospitalizations.  . Eczema   . Acne   . Hidradenitis       Past Surgical History  Procedure Laterality Date  . Breast surgery  02/01/2008    Reduction    Family History  Problem Relation Age of Onset  . Diabetes Mother   . Hypertension Mother   . Hyperlipidemia Mother   . Cancer Maternal Grandfather     ?    Social History:  reports that she has never smoked. She has never used smokeless tobacco. She reports that she does not drink alcohol or use illicit drugs.  Allergies: No Known Allergies   (Not in a hospital admission)  REVIEW OF SYSTEMS: A ROS was performed and pertinent positives and negatives are included in the history.  GENERAL: No fevers or chills. HEENT: No change in vision, no earache, sore throat or sinus congestion. NECK: No pain or stiffness. CARDIOVASCULAR: No chest pain or pressure. No palpitations. PULMONARY: No shortness of breath, cough or wheeze. GASTROINTESTINAL: No abdominal pain, nausea, vomiting or diarrhea, melena or bright red blood per rectum. GENITOURINARY: No urinary frequency, urgency, hesitancy or dysuria. MUSCULOSKELETAL: No joint or muscle pain, no back pain, no recent trauma. DERMATOLOGIC: No rash, no itching, no lesions. ENDOCRINE: No polyuria, polydipsia, no heat or cold intolerance. No recent change in weight. HEMATOLOGICAL: No anemia or easy bruising or bleeding. NEUROLOGIC: No headache, seizures, numbness, tingling or weakness. PSYCHIATRIC: No depression, no loss of interest in normal activity or change in sleep pattern.     Blood pressure 128/84, last menstrual period 06/18/2013.  Physical Exam:  HEENT:unremarkable Neck:Supple, midline, no thyroid megaly, no carotid bruits Lungs:  Clear to auscultation no rhonchi's or wheezes Heart:Regular rate and rhythm, no murmurs or gallops Breast  Exam: Not done Abdomen: Soft nontender no rebound or guarding Pelvic:BUS within normal limits, and the fourchette with numerous condylomas Vagina: Menstrual blood present Cervix: As described above Uterus:  Anteverted normal size shape and consistency Adnexa: No palpable masses or tenderness Extremities: No cords, no edema Rectal: Not done   Assessment/Plan: 23 year old with CIN-1 and VIN 1 along with extensive condylomas at the area of the fourchette be treated with CO2 laser ablation and a surgical outpatient setting. The risks benefits and pros and cons of the operation were discussed with the patient as outlined below. Literature information was previously provided.                        Patient was counseled as to the risk of surgery to include the following:  1. Infection (prohylactic antibiotics will be administered)  2. DVT/Pulmonary Embolism (prophylactic pneumo compression stockings will be used)  3.Trauma to internal organs requiring additional surgical procedure to repair any injury to     Internal organs requiring perhaps additional hospitalization days.  4.Hemmorhage requiring transfusion and blood products which carry risks such as  anaphylactic reaction, hepatitis and AIDS  Patient had received literature information on the procedure scheduled and all her questions were answered and fully accepts all risk.   Starlyn Skeans FernandezMD4:23 PMTD@Note : This dictation was prepared with  Dragon/digital dictation along withSmart phrase technology. Any transcriptional errors that result from this process are unintentional.      Terrance Mass 06/18/2013, 4:03 PM  Note: This dictation was prepared with  Dragon/digital dictation along withSmart phrase technology. Any transcriptional errors that result from this process are unintentional.

## 2013-07-18 NOTE — Telephone Encounter (Signed)
Pt had surgery today and the Rx you prescribed oxycodone-acetaminophen (PERCOCET) 2.5-325 MG per tablet is not available. Order 5/325? Please advise

## 2013-07-18 NOTE — Anesthesia Preprocedure Evaluation (Addendum)
Anesthesia Evaluation  Patient identified by MRN, date of birth, ID band Patient awake    Reviewed: Allergy & Precautions, H&P , NPO status , Patient's Chart, lab work & pertinent test results  Airway Mallampati: II TM Distance: >3 FB Neck ROM: Full    Dental no notable dental hx.    Pulmonary neg pulmonary ROS, asthma ,  breath sounds clear to auscultation  Pulmonary exam normal       Cardiovascular negative cardio ROS  Rhythm:Regular Rate:Normal     Neuro/Psych negative neurological ROS  negative psych ROS   GI/Hepatic negative GI ROS, Neg liver ROS, GERD-  Medicated and Controlled,  Endo/Other  negative endocrine ROSMorbid obesity  Renal/GU negative Renal ROS  negative genitourinary   Musculoskeletal negative musculoskeletal ROS (+)   Abdominal   Peds negative pediatric ROS (+)  Hematology negative hematology ROS (+)   Anesthesia Other Findings   Reproductive/Obstetrics negative OB ROS                          Anesthesia Physical Anesthesia Plan  ASA: III  Anesthesia Plan: General   Post-op Pain Management:    Induction: Intravenous  Airway Management Planned: LMA  Additional Equipment:   Intra-op Plan:   Post-operative Plan:   Informed Consent: I have reviewed the patients History and Physical, chart, labs and discussed the procedure including the risks, benefits and alternatives for the proposed anesthesia with the patient or authorized representative who has indicated his/her understanding and acceptance.   Dental advisory given  Plan Discussed with: CRNA  Anesthesia Plan Comments:         Anesthesia Quick Evaluation

## 2013-07-18 NOTE — Op Note (Signed)
   Operative Note  07/18/2013  8:11 AM  PATIENT:  Kelly Hunter  23 y.o. female   PRE-OPERATIVE DIAGNOSIS: Cervical dysplasia,  vulvar dysplasia, condyloma (CIN-1/VIN-1 condyloma acuminatum)   POST-OPERATIVE DIAGNOSIS: Cervical dysplasia, Vulvar dysplasia, condyloma (CIN-1/VIN-1 condyloma acuminatum)   PROCEDURE:  Procedure(s): C02 laser of vagina and cervix  SURGEON:  Surgeon(s): Terrance Mass, MD  ANESTHESIA:   general  FINDINGS: The CIN-1 cervix 12:00 position, VIn-1 and condylomata fourchette and perineum  DESCRIPTION OF OPERATION: The patient was taken to the operating room where she underwent a successful endotracheal anesthesia. Patient received 2 g of Cefotan for prophylaxis. She also had PAS stockings for prophylaxis. Time out was undertaken. Patient properly identified as well as the procedure to be undertaken. The patient was placed in the high lithotomy position. The bladder was evacuated of its contents with a red Robinson catheter for a total of 30 cc. The colposcope was brought into view. A titanium coated speculum was introduced into the vagina. Acetic acid was applied. The systematic inspection of the entire vagina cervix was undertaken. The previous biopsy site at the 12:00 position of the ectocervix was identified. The blue light filtered identified the area previously biopsied. With a CO2 laser set at 8 W continuous mode with a spot size of 1.5 mm a circumferential marking of the ectocervix was undertaken. In a paint brush stroke technique the ectocervix was ablated to a depth of 2 mm and extending beyond the area of the cervical dysplasia as well. Monsel solution was then applied.  The second part of the procedure consisted of a bleeding in the area of the fourchette and parts of the perineum were Vin 1/ condylomatous lesions had been identified. This was confirmed with the colposcope once again after applying acetic acid. The CO2 laser was then put at 67 W  continuous mode and once again in a paint brush stroke technique the areas were ablated to a depth of 2 mm. Once again leaving an adequate margin away from the lesions. Upon completion of the Silvadene cream was applied externally. The patient was awakened and transferred to recovery with stable vital signs. She received 30 mg of Toradol IV.   ESTIMATED BLOOD LOSS: Minimal  Intake/Output Summary (Last 24 hours) at 07/18/13 0811 Last data filed at 07/18/13 0740  Gross per 24 hour  Intake   1000 ml  Output     50 ml  Net    950 ml     BLOOD ADMINISTERED:none   LOCAL MEDICATIONS USED:  NONE  SPECIMEN:  Source of Specimen:  None  DISPOSITION OF SPECIMEN:  N/A  COUNTS:  YES  PLAN OF CARE: Transfer to PACU  [facility] HMD8:11 AMTD@  Note: This dictation was prepared with  Dragon/digital dictation along withSmart phrase technology. Any transcriptional errors that result from this process are unintentional.

## 2013-07-19 ENCOUNTER — Encounter (HOSPITAL_COMMUNITY): Payer: Self-pay | Admitting: Gynecology

## 2013-07-19 NOTE — Anesthesia Postprocedure Evaluation (Signed)
  Anesthesia Post-op Note  Patient: Kelly Hunter  Procedure(s) Performed: Procedure(s): C02 laser of vagina and cervix (N/A) Patient is awake and responsive. Pain and nausea are reasonably well controlled. Vital signs are stable and clinically acceptable. Oxygen saturation is clinically acceptable. There are no apparent anesthetic complications at this time. Patient is ready for discharge.

## 2013-08-05 ENCOUNTER — Encounter: Payer: Self-pay | Admitting: Gynecology

## 2013-08-05 ENCOUNTER — Ambulatory Visit (INDEPENDENT_AMBULATORY_CARE_PROVIDER_SITE_OTHER): Payer: 59 | Admitting: Gynecology

## 2013-08-05 VITALS — BP 126/84

## 2013-08-05 DIAGNOSIS — Z09 Encounter for follow-up examination after completed treatment for conditions other than malignant neoplasm: Secondary | ICD-10-CM

## 2013-08-05 DIAGNOSIS — Z23 Encounter for immunization: Secondary | ICD-10-CM

## 2013-08-05 MED ORDER — LIDOCAINE 5 % EX OINT: 1.0000 "application " | TOPICAL_OINTMENT | CUTANEOUS | Status: DC | PRN

## 2013-08-05 NOTE — Addendum Note (Signed)
Addended by: Thurnell Garbe A on: 08/05/2013 11:37 AM   Modules accepted: Orders

## 2013-08-05 NOTE — Progress Notes (Signed)
   Patient presented to the office for 2 weeks postop visit. Patient status post CO2 laser ablation of vulvar and cervical dysplasia and vulvar condyloma acuminatum as follows:  The CIN-1 cervix 12:00 position, VIn-1 and condylomata fourchette and perineum  Patient is doing well has been placing Silvadene cream at bedtime.  Exam: Areas of fourchette good granulation tissue present. Clean edges no evidence of infection. Cervix completely healed.  Assessment/plan: Patient with history of CIN-1 of the ectocervix at 12:00 position and VIN-1 of the fourchette along with condyloma acuminatum status post CO2 laser ablation doing well. She will do sitz baths every night and continued to apply the Silvadene cream for a few more days and then to follow with Neosporin cream at bedtime. I will call her in 5% lidocaine gel to apply during the day when necessary discomfort. She will return back in one month for followup.

## 2013-08-27 ENCOUNTER — Ambulatory Visit (INDEPENDENT_AMBULATORY_CARE_PROVIDER_SITE_OTHER): Payer: 59 | Admitting: Physician Assistant

## 2013-08-27 VITALS — BP 110/76 | HR 94 | Temp 98.4°F | Resp 18 | Ht 64.0 in | Wt 235.0 lb

## 2013-08-27 DIAGNOSIS — M25569 Pain in unspecified knee: Secondary | ICD-10-CM

## 2013-08-27 DIAGNOSIS — L03116 Cellulitis of left lower limb: Secondary | ICD-10-CM

## 2013-08-27 DIAGNOSIS — L02419 Cutaneous abscess of limb, unspecified: Secondary | ICD-10-CM

## 2013-08-27 DIAGNOSIS — L03119 Cellulitis of unspecified part of limb: Secondary | ICD-10-CM

## 2013-08-27 DIAGNOSIS — M25562 Pain in left knee: Secondary | ICD-10-CM

## 2013-08-27 MED ORDER — DOXYCYCLINE HYCLATE 100 MG PO CAPS: 100.0000 mg | ORAL_CAPSULE | Freq: Two times a day (BID) | ORAL | Status: DC

## 2013-08-27 NOTE — Patient Instructions (Signed)

## 2013-08-27 NOTE — Progress Notes (Signed)
   Subjective:    Patient ID: Kelly Hunter, female    DOB: 11/13/1990, 23 y.o.   MRN: [id number]  HPI 23 year old female presents for evaluation of bump on her left leg that has been progressively worsening x 4 days. Admits the it is becoming increasingly painful and the surrounding erythema is enlarging.  Yesterday she was able to express a small amount of pus.   Denies fevers, chills, nausea, vomiting, or headache.  No hx of MRSA or staph infections.  She is otherwise healthy with no other concerns today.     Review of Systems  Constitutional: Negative for fever and chills.  Gastrointestinal: Negative for nausea and vomiting.  Skin: Positive for color change and wound.  Neurological: Negative for dizziness and headaches.       Objective:   Physical Exam  Constitutional: She is oriented to person, place, and time. She appears well-developed and well-nourished.  HENT:  Head: Normocephalic and atraumatic.  Right Ear: External ear normal.  Left Ear: External ear normal.  Eyes: Conjunctivae are normal.  Neck: Normal range of motion.  Cardiovascular: Normal rate.   Pulmonary/Chest: Effort normal.  Neurological: She is alert and oriented to person, place, and time.  Skin:     Noted area has about an 8 cm in diameter area of erythema with central induration and a small scabbed area in the center. No fluctuance or drainage noted. +TTP and warmth.   Psychiatric: She has a normal mood and affect. Her behavior is normal. Judgment and thought content normal.     No purulence expressed. Area of cellulitis marked with skin scribe.      Assessment & Plan:  Cellulitis of leg, left - Plan: doxycycline (VIBRAMYCIN) 100 MG capsule  Pain in joint, lower leg, left  Early cellulitis/abscess - will treat with doxycycline 100 mg bid x 10 days Recommend warm compresses and elevation.  Out of work tomorrow. May take ibuprofen or tylenol as needed for pain RTC precautions discussed. F/u  if symptoms worsening or fail to improve.

## 2013-08-31 DIAGNOSIS — N870 Mild cervical dysplasia: Secondary | ICD-10-CM

## 2013-08-31 DIAGNOSIS — N900 Mild vulvar dysplasia: Secondary | ICD-10-CM

## 2013-08-31 DIAGNOSIS — A630 Anogenital (venereal) warts: Secondary | ICD-10-CM

## 2013-08-31 HISTORY — DX: Anogenital (venereal) warts: A63.0

## 2013-08-31 HISTORY — DX: Mild vulvar dysplasia: N90.0

## 2013-08-31 HISTORY — DX: Mild cervical dysplasia: N87.0

## 2013-09-03 ENCOUNTER — Ambulatory Visit: Payer: 59 | Admitting: Gynecology

## 2013-09-19 ENCOUNTER — Ambulatory Visit (INDEPENDENT_AMBULATORY_CARE_PROVIDER_SITE_OTHER): Payer: 59 | Admitting: Gynecology

## 2013-09-19 ENCOUNTER — Encounter: Payer: Self-pay | Admitting: Gynecology

## 2013-09-19 VITALS — BP 118/74

## 2013-09-19 DIAGNOSIS — Z87412 Personal history of vulvar dysplasia: Secondary | ICD-10-CM

## 2013-09-19 DIAGNOSIS — L293 Anogenital pruritus, unspecified: Secondary | ICD-10-CM

## 2013-09-19 DIAGNOSIS — N898 Other specified noninflammatory disorders of vagina: Secondary | ICD-10-CM

## 2013-09-19 DIAGNOSIS — A630 Anogenital (venereal) warts: Secondary | ICD-10-CM

## 2013-09-19 DIAGNOSIS — Z09 Encounter for follow-up examination after completed treatment for conditions other than malignant neoplasm: Secondary | ICD-10-CM

## 2013-09-19 DIAGNOSIS — Z87410 Personal history of cervical dysplasia: Secondary | ICD-10-CM

## 2013-09-19 LAB — WET PREP FOR TRICH, YEAST, CLUE
CLUE CELLS WET PREP: NONE SEEN
TRICH WET PREP: NONE SEEN
Yeast Wet Prep HPF POC: NONE SEEN

## 2013-09-19 MED ORDER — IMIQUIMOD 5 % EX CREA: TOPICAL_CREAM | CUTANEOUS | Status: DC

## 2013-09-19 MED ORDER — FLUCONAZOLE 150 MG PO TABS: 150.0000 mg | ORAL_TABLET | Freq: Once | ORAL | Status: DC

## 2013-09-19 NOTE — Progress Notes (Signed)
   Patient presented to the office today for her six-week postop visit.Patient status post CO2 laser ablation of vulvar and cervical dysplasia and vulvar condyloma acuminatum as follows:   The CIN-1 cervix 12:00 position, VIn-1 and condylomata fourchette and perineum  Patient was complaining of vulvar  Pruritus. Exam: Bartholin urethra Skene was within normal limits. The area of the perineum whereby the CO2 laser was performed has healed completely but in the inner aspect of the fourchette there were 2 condylomatous areas still present. On speculum exam there were no cervical or vaginal lesions endocervical area had healed from the previous CO2 laser.  Assessment/plan: Patient status post CO2 laser ablation of vulvar and cervical dysplasia as well as for condyloma. Appears to have 2 condylomatous areas still present on the inner aspect of the fourchette on patient's right side. This was treated today with 50% TCA. Patient for the next 3 months will apply Aldara cream at bedtime 3 times a week and then return to the office in one month for followup. The wet prep demonstrated few WBC and bacteria. Because of her pruritus mostly externally I will call her a prescription for Diflucan 150 mg one by mouth.

## 2013-09-20 ENCOUNTER — Telehealth: Payer: Self-pay | Admitting: *Deleted

## 2013-09-20 NOTE — Telephone Encounter (Signed)
Pt left message asking if okay to proceed with applying Aldara cream at bedtime 3 times a week. I left message on voicemail okay to proceed as directed

## 2013-10-15 ENCOUNTER — Telehealth: Payer: Self-pay | Admitting: *Deleted

## 2013-10-15 ENCOUNTER — Ambulatory Visit: Payer: 59 | Admitting: Family Medicine

## 2013-10-15 MED ORDER — IMIQUIMOD 5 % EX CREA: TOPICAL_CREAM | CUTANEOUS | Status: DC

## 2013-10-15 NOTE — Telephone Encounter (Signed)
My instructions were to apply Aldara 3 times a week for 3 months. She was seen in August so yes she will need to continue until she sees me in November

## 2013-10-15 NOTE — Telephone Encounter (Signed)
Pt informed with the below note, rx sent. 

## 2013-10-15 NOTE — Telephone Encounter (Signed)
Pt calling to follow up from [place] 09/19/13 she has ran out of the Aldara cream apply bedtime 3 times a week. Was told to follow up in 1 month she is scheduled on 12/17/13. Pt asked does she still need to apply cream until this appointment date? Please advise

## 2013-10-21 ENCOUNTER — Encounter: Payer: Self-pay | Admitting: Family Medicine

## 2013-10-21 ENCOUNTER — Ambulatory Visit (INDEPENDENT_AMBULATORY_CARE_PROVIDER_SITE_OTHER): Payer: 59 | Admitting: Family Medicine

## 2013-10-21 VITALS — BP 125/78 | HR 90 | Temp 98.4°F | Resp 16 | Ht 64.0 in | Wt 234.0 lb

## 2013-10-21 DIAGNOSIS — J309 Allergic rhinitis, unspecified: Secondary | ICD-10-CM

## 2013-10-21 DIAGNOSIS — F430 Acute stress reaction: Secondary | ICD-10-CM

## 2013-10-21 DIAGNOSIS — L708 Other acne: Secondary | ICD-10-CM

## 2013-10-21 DIAGNOSIS — R87610 Atypical squamous cells of undetermined significance on cytologic smear of cervix (ASC-US): Secondary | ICD-10-CM

## 2013-10-21 DIAGNOSIS — L309 Dermatitis, unspecified: Secondary | ICD-10-CM

## 2013-10-21 DIAGNOSIS — L259 Unspecified contact dermatitis, unspecified cause: Secondary | ICD-10-CM

## 2013-10-21 DIAGNOSIS — L732 Hidradenitis suppurativa: Secondary | ICD-10-CM

## 2013-10-21 DIAGNOSIS — N870 Mild cervical dysplasia: Secondary | ICD-10-CM

## 2013-10-21 DIAGNOSIS — Z3041 Encounter for surveillance of contraceptive pills: Secondary | ICD-10-CM

## 2013-10-21 DIAGNOSIS — R87810 Cervical high risk human papillomavirus (HPV) DNA test positive: Secondary | ICD-10-CM

## 2013-10-21 DIAGNOSIS — N900 Mild vulvar dysplasia: Secondary | ICD-10-CM

## 2013-10-21 DIAGNOSIS — A630 Anogenital (venereal) warts: Secondary | ICD-10-CM

## 2013-10-21 MED ORDER — DOXYCYCLINE HYCLATE 100 MG PO CAPS: 100.0000 mg | ORAL_CAPSULE | Freq: Two times a day (BID) | ORAL | Status: DC

## 2013-10-21 MED ORDER — DESOGESTREL-ETHINYL ESTRADIOL 0.15-30 MG-MCG PO TABS: 1.0000 | ORAL_TABLET | Freq: Every day | ORAL | Status: DC

## 2013-10-21 MED ORDER — TRIAMCINOLONE 0.1 % CREAM:EUCERIN CREAM 1:1: TOPICAL_CREAM | CUTANEOUS | Status: DC

## 2013-10-21 NOTE — Progress Notes (Signed)
Subjective:    Patient ID: Kelly Hunter, female    DOB: [birth year], 23 y.o.   MRN: [id number]  10/21/2013  Follow-up   HPI This 23 y.o. female presents for follow-up of the following:    1 Eczema: taking Benadryl daily which seems to be helping eczema.  Using rx cream daily once.  Using [employer].  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  Will not switch away from Degree deodarant.  No previous allergy consultation.  Unable to keep dermatology appointment from 03/2013; requesting referral to be placed again.  Mother thinks that certain foods trigger eczema outbreaks.    2.  Folliculitis/Hidradenitis: randomly occurring; recently had an outbreak; one under buttocks; one in axilla; one on chest.  Had this for years.  Tolerates Doxycycline in past.    3.  Contraception: needs refill of OCP.  Cramps are horrible even on OCP.  Last sexual activity in March 2015.    4. Stress reaction:  Mother admitted to [place] in the past six months.  Pt lives with mother. Mother also has an alcohol problem.  Coping relatively well with stressors but mother recommending counseling for pt. Mother is undergoing counseling.  Work continues to be stressful.    5.  ASCUS pap smear with HPV+: s/p gynecology consultation; s/p colposcopy that revealed CIN-I.   Also genital warts and vaginal abnormality appreciated by Dr. Toney Rakes; biopsy of vaginal area revealed VIN-I.  S/p CO2 laser of vagina and cervix.  Also agreed to receive Gardisil series when pap smear returned abnormal.  Has one more follow-up wth Toney Rakes in 12/2013.  Applying Aldara three times weekly to condyloma vaginal.     Review of Systems  Constitutional: Negative for fever, chills, diaphoresis and fatigue.  HENT: Negative for congestion.   Respiratory: Negative for wheezing.   Genitourinary: Positive for menstrual problem and pelvic pain. Negative for dysuria, vaginal bleeding, vaginal discharge and vaginal pain.  Skin: Positive for  color change, rash and wound.  Allergic/Immunologic: Positive for environmental allergies.  Psychiatric/Behavioral: Negative for suicidal ideas, sleep disturbance, self-injury and dysphoric mood. The patient is not nervous/anxious.     Past Medical History  Diagnosis Date  . Allergy     Allegra  . Asthma     mild; onset in childhood.  No hospitalizations.  . Eczema   . Acne   . Hidradenitis   . CIN I (cervical intraepithelial neoplasia I) 08/31/2013    colposcopy by Toney Rakes; s/p CO2 laser treatment.  Marland Kitchen VIN I (vulvar intraepithelial neoplasia I) 08/31/2013    s/p CO2 treatment; Fernandez/gyn.  . Genital warts 08/31/2013    Aldara treatment; Toney Rakes.  . Chlamydia 03/03/2012   Past Surgical History  Procedure Laterality Date  . Breast surgery  02/01/2008    Reduction  . Co2 laser application N/A 3/54/6568    Procedure: C02 laser of vagina and cervix;  Surgeon: Terrance Mass, MD;  Location: [facility];  Service: Gynecology;  Laterality: N/A;   Allergies  Allergen Reactions  . Other Nausea And Vomiting and Swelling    Tree Nuts (Almonds)  . Peanut-Containing Drug Products    Current Outpatient Prescriptions  Medication Sig Dispense Refill  . Triamcinolone Acetonide (TRIAMCINOLONE 0.1 % CREAM : EUCERIN) CREA Use as directed  1 each  11  . desogestrel-ethinyl estradiol (APRI,EMOQUETTE,SOLIA) 0.15-30 MG-MCG tablet Take 1 tablet by mouth daily.  2 Package  7  . doxycycline (VIBRAMYCIN) 100 MG capsule Take 1 capsule (100 mg total) by  mouth 2 (two) times daily.  60 capsule  2  . fluconazole (DIFLUCAN) 150 MG tablet Take 1 tablet (150 mg total) by mouth once.  1 tablet  0  . imiquimod (ALDARA) 5 % cream Apply topically 3 (three) times a week.  12 each  2   No current facility-administered medications for this visit.       Objective:    BP 125/78  Pulse 90  Temp(Src) 98.4 F (36.9 C) (Oral)  Resp 16  Ht 5\' 4"  (1.626 m)  Wt 234 lb (106.142 kg)  BMI 40.15 kg/m2  SpO2 99%  LMP  10/17/2013 Physical Exam  Nursing note and vitals reviewed. Constitutional: She is oriented to person, place, and time. She appears well-developed and well-nourished. No distress.  HENT:  Head: Normocephalic and atraumatic.  Eyes: Conjunctivae are normal. Pupils are equal, round, and reactive to light.  Neck: Normal range of motion. Neck supple.  Cardiovascular: Normal rate, regular rhythm and normal heart sounds.  Exam reveals no gallop and no friction rub.   No murmur heard. Pulmonary/Chest: Effort normal and breath sounds normal. She has no wheezes. She has no rales.  Neurological: She is alert and oriented to person, place, and time.  Skin: Skin is warm and dry. No rash noted. She is not diaphoretic. No erythema. No pallor.  Carbuncle anterior chest 1 cm without drainage, fluctuants; +induration.  No vesicle or pustule.  Psychiatric: She has a normal mood and affect. Her behavior is normal. Judgment and thought content normal.       Assessment & Plan:   1. Allergic rhinitis, unspecified allergic rhinitis type   2. Hidradenitis   3. Eczema   4. ASCUS with positive high risk HPV cervical   5. VIN I (vulvar intraepithelial neoplasia I)   6. Dysplasia of cervix, low grade (CIN 1)   7. Condyloma acuminatum of vulva   8. ACNE   9. Acute stress reaction   10. Encounter for surveillance of contraceptive pills     1.  Hidradenitis/folliculitis: New/recurrent; rx for Doxycycline provided.   2.  Eczema:  Stable currently; refill of Triamcinolone-eucerin cream provided; refer to dermatology. 3. Allergic Rhinitis and asthma and eczema:  Stable; refer to allergy for allergy testing per patient request. 4.  ASCUS with HPV+/CIN-I: New.  S/p gynecology consultation; s/p colposcopy; +CIN-I: s/p CO2 treatment.  Has started Gardisil series. 5.  VIN-I: New. S/p CO2 treatment by gynecology. 6.  Genital warts: New.  S/p Aldara rx by gynecology. 7.  Acute stress reaction: New.  Refer to psychology  for counseling. Mother with recent behavioral health admission; also with alcohol use.  8. Contraception management: refill of Apri provided but will continuous cycle OCP.   Meds ordered this encounter  Medications  . desogestrel-ethinyl estradiol (APRI,EMOQUETTE,SOLIA) 0.15-30 MG-MCG tablet    Sig: Take 1 tablet by mouth daily.    Dispense:  2 Package    Refill:  7    CONTINUOUS CYCLING; SKIP PLACEBO WEEK/PILLS.  . Triamcinolone Acetonide (TRIAMCINOLONE 0.1 % CREAM : EUCERIN) CREA    Sig: Use as directed    Dispense:  1 each    Refill:  11  . doxycycline (VIBRAMYCIN) 100 MG capsule    Sig: Take 1 capsule (100 mg total) by mouth 2 (two) times daily.    Dispense:  60 capsule    Refill:  2    Return in about 4 weeks (around 11/18/2013) for flu shot, Tetanus vaccine.    Reginia Forts, M.D.  [facility] [address] [place], [place]  [address] [phone] phone [phone] fax

## 2013-10-22 ENCOUNTER — Encounter: Payer: Self-pay | Admitting: Family Medicine

## 2013-10-22 DIAGNOSIS — R87610 Atypical squamous cells of undetermined significance on cytologic smear of cervix (ASC-US): Secondary | ICD-10-CM | POA: Insufficient documentation

## 2013-10-22 DIAGNOSIS — R87810 Cervical high risk human papillomavirus (HPV) DNA test positive: Secondary | ICD-10-CM

## 2013-10-22 DIAGNOSIS — L309 Dermatitis, unspecified: Secondary | ICD-10-CM | POA: Insufficient documentation

## 2013-10-22 DIAGNOSIS — J309 Allergic rhinitis, unspecified: Secondary | ICD-10-CM | POA: Insufficient documentation

## 2013-11-15 ENCOUNTER — Telehealth: Payer: Self-pay

## 2013-11-15 MED ORDER — TRIAMCINOLONE 0.1 % CREAM:EUCERIN CREAM 1:1: TOPICAL_CREAM | CUTANEOUS | Status: DC

## 2013-11-15 NOTE — Telephone Encounter (Signed)
Pt called and [facility] had not received the medication refill sent in Sept by Dr. Tamala Julian- I have resent rx.

## 2013-11-18 ENCOUNTER — Encounter: Payer: Self-pay | Admitting: Family Medicine

## 2013-11-18 ENCOUNTER — Ambulatory Visit (INDEPENDENT_AMBULATORY_CARE_PROVIDER_SITE_OTHER): Payer: 59 | Admitting: Family Medicine

## 2013-11-18 VITALS — BP 127/79 | HR 87 | Temp 98.9°F | Resp 16 | Ht 63.75 in | Wt 234.2 lb

## 2013-11-18 DIAGNOSIS — Z23 Encounter for immunization: Secondary | ICD-10-CM

## 2013-11-18 DIAGNOSIS — A630 Anogenital (venereal) warts: Secondary | ICD-10-CM

## 2013-11-18 DIAGNOSIS — L309 Dermatitis, unspecified: Secondary | ICD-10-CM

## 2013-11-18 DIAGNOSIS — L732 Hidradenitis suppurativa: Secondary | ICD-10-CM

## 2013-11-18 DIAGNOSIS — F4323 Adjustment disorder with mixed anxiety and depressed mood: Secondary | ICD-10-CM

## 2013-11-18 DIAGNOSIS — Z309 Encounter for contraceptive management, unspecified: Secondary | ICD-10-CM

## 2013-11-18 DIAGNOSIS — J302 Other seasonal allergic rhinitis: Secondary | ICD-10-CM

## 2013-11-18 NOTE — Progress Notes (Signed)
Subjective:    Patient ID: Kelly Hunter, female    DOB: Oct 05, 1990, 23 y.o.   MRN: [id number]  HPI   This is a 23 year old with PMH of excema, folliculitis, depression, and HPV is here today for vaccines and a follow up.  1 Eczema: Using the eucerin/triamcinolone acetonide here for her skin.  States that is doing improving, but would like a larger container.   2. Folliculitis/Hidradenitis: Doxycycline has improved folliculitis/infection.  She continues to be compliant.  She has no new lesions to report at this time.  Continues to shave at her underarms, which she uses bath soap and alcohol.  No new findings regarding this.  Shaves around genital area, however has never had difficulties with this.       3. Contraception: Continues to take the OCP.  Reports no side effects.  Has not had a period since 10/17/2013 now that she is on this contraception.  No spotting, or cramping to note.   4. Stress reaction: Dealing with the stressors of mother and job well.  Has yet to consult with psych for counseling.   Denies lack of sleep due to stressors--though when she does not work she does have a hard time getting to sleep, loss of interests, feelings of guilt, loss of energy, change in appetite, or suicidal or homicidal ideations.  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  She has not been exercising since the start of the new position.  She is also eating a lot as she works at [employer].     5. ASCUS pap smear with HPV+: Continues to use the Aldara for the condyloma vaginal, which is working.  She has an appointment with Toney Rakes 12/2013 regarding her genital warts and vaginal abnormality,  VIN-1 and CIN-1 s/p CO2 laser of vagina and cervix, and will receive the last of the series of Gardasil.   Review of Systems  Constitutional: Negative for fever.  HENT: Negative for ear pain.   Respiratory: Negative for apnea and chest tightness.    Cardiovascular: Negative for chest pain and palpitations.  Neurological: Negative for dizziness.  Psychiatric/Behavioral: Negative for suicidal ideas and decreased concentration. The patient is not nervous/anxious.        Objective:   Physical Exam  Constitutional: She appears well-developed and well-nourished. No distress.  HENT:  Head: Normocephalic and atraumatic.  Right Ear: Tympanic membrane is not injected, not perforated and not erythematous.  Left Ear: Tympanic membrane is not injected, not perforated and not erythematous.  Mouth/Throat: No oropharyngeal exudate, posterior oropharyngeal edema or posterior oropharyngeal erythema.  Eyes: Pupils are equal, round, and reactive to light.  Cardiovascular: Normal rate, regular rhythm, S1 normal and S2 normal.  Exam reveals no gallop, no distant heart sounds and no friction rub.   Pulmonary/Chest: Effort normal. No apnea. No respiratory distress.  Skin:     Psychiatric: She has a normal mood and affect. Her speech is normal and behavior is normal. Judgment and thought content normal. Cognition and memory are normal.    BP 127/79  Pulse 87  Temp(Src) 98.9 F (37.2 C) (Oral)  Resp 16  Ht 5' 3.75" (1.619 m)  Wt 234 lb 3.2 oz (106.232 kg)  BMI 40.53 kg/m2  SpO2 100%  LMP 10/17/2013       Assessment & Plan:  Need for prophylactic vaccination and inoculation against influenza - Plan: Flu Vaccine QUAD 36+ mos IM  Need  for prophylactic vaccination with combined diphtheria-tetanus-pertussis (DTP) vaccine - Plan: Tdap vaccine greater than or equal to 7yo IM  Eczema Stable. Continue to use the triamcinolone Folliculitis/Hidradenitis: Stable. Continue with doxycycline.  Advised patient of not shaving.  Alternatives discussed.       Contraception Stable.  Continue OCP.  Adjustment disorder with mixed anxiety and depressed mood: Stable and able to recognize triggers and apply life-skills during high stressors, with little  residual anxiety or depression.  Will not restart an anti-depressant at this time.  Advised patient to call counselor for appointment setting.        Condyloma Acuminatum Stable and follow up due with Dr. Toney Rakes next month.  Will await results of that appointment.  Ivar Drape, PA-C [facility] 10/19/20151:06 PM

## 2013-11-28 MED ORDER — TRIAMCINOLONE 0.1 % CREAM:EUCERIN CREAM 1:1: TOPICAL_CREAM | CUTANEOUS | Status: DC

## 2013-11-28 NOTE — Progress Notes (Signed)
History and physical examinations obtained with Ivar Drape, PA-C.  Agree with assessment and plan.  Additionally, pt underwent allergy consultation with allergy testing.  Still awaiting referrals to dermatology and psychology.  Also requesting larger jar of eczema cream.  Also desires STD screening but would like to return in upcoming month for testing.

## 2013-12-16 ENCOUNTER — Ambulatory Visit: Payer: 59 | Admitting: Family Medicine

## 2013-12-16 ENCOUNTER — Encounter: Payer: Self-pay | Admitting: Family Medicine

## 2013-12-16 ENCOUNTER — Ambulatory Visit (INDEPENDENT_AMBULATORY_CARE_PROVIDER_SITE_OTHER): Payer: 59 | Admitting: Family Medicine

## 2013-12-16 VITALS — BP 120/78 | HR 93 | Temp 98.7°F | Resp 16 | Ht 63.75 in | Wt 232.0 lb

## 2013-12-16 DIAGNOSIS — N760 Acute vaginitis: Secondary | ICD-10-CM

## 2013-12-16 DIAGNOSIS — Z23 Encounter for immunization: Secondary | ICD-10-CM

## 2013-12-16 DIAGNOSIS — B3731 Acute candidiasis of vulva and vagina: Secondary | ICD-10-CM

## 2013-12-16 DIAGNOSIS — B9689 Other specified bacterial agents as the cause of diseases classified elsewhere: Secondary | ICD-10-CM

## 2013-12-16 DIAGNOSIS — Z72821 Inadequate sleep hygiene: Secondary | ICD-10-CM

## 2013-12-16 DIAGNOSIS — B373 Candidiasis of vulva and vagina: Secondary | ICD-10-CM

## 2013-12-16 DIAGNOSIS — A499 Bacterial infection, unspecified: Secondary | ICD-10-CM

## 2013-12-16 DIAGNOSIS — Z7251 High risk heterosexual behavior: Secondary | ICD-10-CM

## 2013-12-16 DIAGNOSIS — N898 Other specified noninflammatory disorders of vagina: Secondary | ICD-10-CM

## 2013-12-16 LAB — POCT WET PREP WITH KOH
KOH PREP POC: POSITIVE
Trichomonas, UA: NEGATIVE
YEAST WET PREP PER HPF POC: NEGATIVE

## 2013-12-16 MED ORDER — FLUCONAZOLE 150 MG PO TABS: 150.0000 mg | ORAL_TABLET | Freq: Once | ORAL | Status: DC

## 2013-12-16 MED ORDER — METRONIDAZOLE 500 MG PO TABS: 500.0000 mg | ORAL_TABLET | Freq: Two times a day (BID) | ORAL | Status: DC

## 2013-12-16 NOTE — Patient Instructions (Addendum)
Sleep Hygiene   Sleep hygiene education - Sleep hygiene teaches good sleeping habits . This includes:  ?Sleep only as much as necessary to feel rested and then get out of bed. ?Maintain a regular sleep schedule (the same bedtime and wake time every day). ?Do not force sleep. ?Avoid caffeinated beverages after lunch. ?Avoid alcohol near bedtime. ?Do not smoke (particularly during the evening). ?Do not go to bed hungry. ?Adjust the bedroom environment (light, noise, temperature) so that you are comfortable before you lie down. ?Deal with concerns or worries before bedtime. Make a list of things to work on for the next day so anxiety is reduced at night. ?Exercise regularly, preferably four or more hours before bedtime. ?Avoid prolonged use of phones or reading devices ("e-books") that give off light before bed. This can make it harder to fall asleep.  Relaxation - Relaxation therapy involves progressively relaxing your muscles from your head down to your feet. Here is a sample of a relaxation program: Beginning with the muscles in your face, squeeze (contract) your muscles gently for one to two seconds and then relax. Repeat several times. Use the same technique for other muscle groups, usually in the following sequence: jaw and neck, shoulders, upper arms, lower arms, fingers, chest, abdomen, buttocks, thighs, calves, and feet. Repeat this cycle for 45 minutes, if necessary. This relaxation program can promote restfulness and sleep. Relaxation therapy is sometimes combined with biofeedback.  Biofeedback - Biofeedback uses sensors placed on your skin to track muscle tension or brain rhythms. You can see a display of your tension level or activity, allowing you to gauge your level of tension and develop strategies to reduce this tension. As an example, you may slow your breathing, progressively relax muscles, or practice deep breathing to reduce tension.  Stimulus control - Stimulus control therapy  is based on the idea that some people with insomnia have learned to associate the bedroom with staying awake rather than sleeping. ?You should spend no more than 20 minutes lying in bed trying to fall asleep. ?If you cannot fall asleep within 20 minutes, get up, go to another room and read or find another relaxing activity until you feel sleepy again. Activities such as eating, balancing your checkbook, doing housework, watching TV, or studying for a test, which "reward" you for staying awake, should be avoided. ?When you start to feel sleepy, you can return to bed. If you cannot fall asleep in another 20 minutes, repeat the process. ?Set an alarm clock and get up at the same time every day, including weekends. ?Do not take a nap during the day. You may not sleep much on the first night. However, sleep is more likely on succeeding nights because sleepiness is increased and naps are not allowed. Sleep restriction - Some people with insomnia have long awakenings during the night and some try to deal with their poor sleep by staying in bed longer in the morning to "make up" some of their lost sleep. This additional sleep later in the morning may make it more difficult to fall asleep that night, resulting in the need to stay in bed even longer the following morning. Sleep restriction consolidates sleep and breaks this cycle. ?The first step in sleep restriction therapy is to estimate the average number of hours per night that you sleep. Decrease the total time allowed in bed per night to that average sleep time, as long as it is not less than four hours. ?A rigid bedtime and awakening time   are recommended and naps are not permitted. This causes partial sleep deprivation, which increases your ability to sleep the next night. ?Once sleep has improved, you may slowly increase your time in bed to find your needed hours of sleep. During the first few days to weeks, you may feel sleepy during the day and may have  difficulty being alert. You can deal with this by increasing activity levels when sleepy, avoiding sedentary activities, and discussing the sleep restriction therapy with your therapist, who may need to fine tune sleep times. It is best to try sleep restriction therapy with a therapist because reducing sleep too much can produce sleepiness that can result in accidents.   

## 2013-12-16 NOTE — Progress Notes (Signed)
IDENTIFYING INFORMATION  Kelly Hunter / DOB: 1990-08-24 / MRN: [id number]  The patient has OBESITY, NOS; ASTHMA, EXERCISE INDUCED; HYDRADENITIS; ACNE; ASCUS with positive high risk HPV; Condyloma acuminatum of vulva; VIN I (vulvar intraepithelial neoplasia I); Dysplasia of cervix, low grade (CIN 1); Eczema; ASCUS with positive high risk HPV cervical; and Rhinitis, allergic on her problem list.  SUBJECTIVE  Chief Complaint: STD testing and Sleeping Problem   History of present illness: Kelly Hunter is a 23 y.o. year old female who presents for STI screening.  She reports that she has been sexually inactive since March of 2015, and would like to be screened for STI.  She had a wet prep on 09/19/2013 which was negative for Trichomonas, Yeast, and BV.  She would like an HIV, Syphilis, and GC/Chlamydia today. She tested negative for GC/Chlamydia in April of this year.   She reports that she has been having some vaginal discharge which has been on and off for roughly one month. She believes it may be due to the medications that she is applying to her vulva and vagina for diagnosis of HPV, however she is not sure. She also reports some vulvar irritation/pruritis.  She would like to test for a yeast infection today.   She reports poor sleep.  She gets off from work at roughly 11:30 and does not go to sleep until 2:30 or 3:00 am.  She reports waking up at roughly 6 am, and can not go back to sleep.  She states that she looks at her phone until her mother gets up at roughly 55, at which point she gets out of bed.  She is able to go back to sleep at about 10 am, and sleeps until 12 at which point is it time to go back to work.  She has tried her mother's Ambien for this, which helps her go to sleep at 12 am and stay asleep until 8 or 9 am. She has not tried setting a sleep schedule and avoiding electronics while lying in bed.  She denies dysthymic mood and anhedonia, and reports some moodiness roughly  once per week.  She plans to start psychotherapy in December as she was referred by Dr. Tamala Julian for stress related to her mother and her work.   She plans to resume her college education in January.    She  has a past medical history of Allergy; Asthma; Eczema; Acne; Hidradenitis; CIN I (cervical intraepithelial neoplasia I) (08/31/2013); VIN I (vulvar intraepithelial neoplasia I) (08/31/2013); Genital warts (08/31/2013); and Chlamydia (03/03/2012).    She has a current medication list which includes the following prescription(s): desogestrel-ethinyl estradiol, triamcinolone 0.1 % cream : eucerin, fluconazole, and imiquimod.  Kelly Hunter is allergic to other and peanut-containing drug products. She  reports that she has never smoked. She has never used smokeless tobacco. She reports that she does not drink alcohol or use illicit drugs. She  reports that she currently engages in sexual activity and has had female partners. She reports using the following method of birth control/protection: Pill.  The patient  has past surgical history that includes Breast surgery (02/01/2008) and Co2 laser application (N/A, 09/25/35).  Her family history includes Cancer in her maternal grandfather; Diabetes in her mother; Hyperlipidemia in her mother; Hypertension in her mother; Mental illness in her mother.  Review of Systems  Constitutional: Negative.   HENT: Negative.   Eyes: Negative.   Respiratory: Negative.   Cardiovascular: Negative.   Gastrointestinal: Negative.  Genitourinary: Negative.   Musculoskeletal: Positive for myalgias (existing upper/mid back, made worse with working long hours).  Skin: Positive for itching. Negative for rash.    OBJECTIVE  Blood pressure 120/78, pulse 93, temperature 98.7 F (37.1 C), temperature source Oral, resp. rate 16, height 5' 3.75" (1.619 m), weight 232 lb (105.235 kg), last menstrual period 10/17/2013, SpO2 98 %. The patient's body mass index is 40.15 kg/(m^2).  Physical  Exam  Constitutional: She is oriented to person, place, and time. She appears well-developed and well-nourished. No distress.  HENT:  Head: Normocephalic and atraumatic.  Right Ear: External ear normal.  Left Ear: External ear normal.  Nose: Nose normal.  Mouth/Throat: Oropharynx is clear and moist. No oropharyngeal exudate.  Eyes: Conjunctivae and EOM are normal. Pupils are equal, round, and reactive to light. Right eye exhibits no discharge. Left eye exhibits no discharge. No scleral icterus.  Neck: Normal range of motion.  Cardiovascular: Normal rate.   Respiratory: Effort normal. No respiratory distress.  Musculoskeletal: Normal range of motion.  Lymphadenopathy:    She has no cervical adenopathy.  Neurological: She is alert and oriented to person, place, and time. She has normal strength. No cranial nerve deficit.  Skin: Skin is warm and dry. She is not diaphoretic.  Psychiatric: She has a normal mood and affect. Her speech is normal and behavior is normal. Judgment and thought content normal. Cognition and memory are normal.    Results for orders placed or performed in visit on 12/16/13 (from the past 24 hour(s))  POCT Wet Prep with KOH     Status: Abnormal   Collection Time: 12/16/13  4:40 PM  Result Value Ref Range   Trichomonas, UA Negative    Clue Cells Wet Prep HPF POC tntc    Epithelial Wet Prep HPF POC tntc    Yeast Wet Prep HPF POC neg    Bacteria Wet Prep HPF POC 3+    RBC Wet Prep HPF POC 4-7    WBC Wet Prep HPF POC tntc    KOH Prep POC Positive     ASSESSMENT & PLAN  Kelly Hunter was seen today for std testing.  Diagnoses and associated orders for this visit:  High risk sexual behavior - GC/Chlamydia Amp Probe, Genital - HIV antibody (with reflex) - RPR -     Wet prep  Poor sleep hygiene -     Patient given educational materials which were discussed in clinic.  She is amenable to trying this before instituting a sleep aid.    Bacterial Vaginosis -      Flagyl 500 mg po bid.  Pt advised not to drink while taking this medication.    Yeast Vaginosis -     Diflucan 150 po once, to be taken after the resolution of bacterial vaginosis.    The patient was instructed to to call or comeback to clinic as needed, or should symptoms warrant.  Philis Fendt, MHS, PA-C [facility] 12/16/2013 4:42 PM

## 2013-12-17 ENCOUNTER — Ambulatory Visit (INDEPENDENT_AMBULATORY_CARE_PROVIDER_SITE_OTHER): Payer: 59 | Admitting: Gynecology

## 2013-12-17 ENCOUNTER — Encounter: Payer: Self-pay | Admitting: Gynecology

## 2013-12-17 VITALS — BP 124/86

## 2013-12-17 DIAGNOSIS — A630 Anogenital (venereal) warts: Secondary | ICD-10-CM

## 2013-12-17 LAB — GC/CHLAMYDIA PROBE AMP
CT Probe RNA: NEGATIVE
GC Probe RNA: NEGATIVE

## 2013-12-17 LAB — HIV ANTIBODY (ROUTINE TESTING W REFLEX): HIV 1&2 Ab, 4th Generation: REACTIVE — AB

## 2013-12-17 LAB — HIV 1/2 CONFIRMATION
HIV-1 antibody: POSITIVE — AB
HIV-2 Ab: NEGATIVE

## 2013-12-17 LAB — RPR

## 2013-12-17 NOTE — Progress Notes (Addendum)
   Patient presented to the office todayafter three-month treatment with Aldara cream for vulvar condyloma. Patient's history as follows:  Patient was seen the office on 09/19/2013 for six-week postop visit. Patient was status post CO2 laser ablation of vulvar and cervical dysplasia and vulvar condyloma acuminatum as follows:  The CIN-1 cervix 12:00 position, VIn-1 and condylomata fourchette and perineum  It was noted during that visit that the area of the perineum whereby the CO2 laser was performed has healed completely but in the inner aspect of the fourchette there were 2 condylomatous areas still present. On speculum exam there were no cervical or vaginal lesions endocervical area had healed from the previous CO2 laser.  She was treated at that visit on August 20 with 50% TCA and was prescribed Aldara cream to apply 3 times a week for 3 months in follow-up visit reason she is here for today.  The only finding today was at the area the fourchette to isolated areas towards the right. The 2 other 1 seen the area has completely healed nonpresent at this time. Once again 50% TCA was applied. She will return back again in 10 days and we will reapply the 50% TCA and see how she responds. We may continue to do so every 10 days for several treatments and/or go back to an additional 3 months of the Aldara cream instead of having to take her back to the operating room for CO2 laser ablation of the area. Patient is informing that she completed her HPV (9) serious last week. Patient currently not sexually active.

## 2013-12-18 ENCOUNTER — Ambulatory Visit (INDEPENDENT_AMBULATORY_CARE_PROVIDER_SITE_OTHER): Payer: 59 | Admitting: Family Medicine

## 2013-12-18 ENCOUNTER — Encounter: Payer: Self-pay | Admitting: Family Medicine

## 2013-12-18 VITALS — BP 138/80 | HR 110 | Temp 98.6°F | Resp 16 | Ht 63.5 in | Wt 231.0 lb

## 2013-12-18 DIAGNOSIS — Z7251 High risk heterosexual behavior: Secondary | ICD-10-CM

## 2013-12-18 DIAGNOSIS — Z21 Asymptomatic human immunodeficiency virus [HIV] infection status: Secondary | ICD-10-CM

## 2013-12-18 LAB — CBC WITH DIFFERENTIAL/PLATELET
Basophils Absolute: 0 10*3/uL (ref 0.0–0.1)
Basophils Relative: 0 % (ref 0–1)
EOS PCT: 1 % (ref 0–5)
Eosinophils Absolute: 0.1 10*3/uL (ref 0.0–0.7)
HEMATOCRIT: 37.3 % (ref 36.0–46.0)
HEMOGLOBIN: 13 g/dL (ref 12.0–15.0)
LYMPHS ABS: 2 10*3/uL (ref 0.7–4.0)
Lymphocytes Relative: 25 % (ref 12–46)
MCH: 27.3 pg (ref 26.0–34.0)
MCHC: 34.9 g/dL (ref 30.0–36.0)
MCV: 78.2 fL (ref 78.0–100.0)
MPV: 9.5 fL (ref 9.4–12.4)
Monocytes Absolute: 0.6 10*3/uL (ref 0.1–1.0)
Monocytes Relative: 8 % (ref 3–12)
Neutro Abs: 5.3 10*3/uL (ref 1.7–7.7)
Neutrophils Relative %: 66 % (ref 43–77)
Platelets: 343 10*3/uL (ref 150–400)
RBC: 4.77 MIL/uL (ref 3.87–5.11)
RDW: 15.6 % — ABNORMAL HIGH (ref 11.5–15.5)
WBC: 8.1 10*3/uL (ref 4.0–10.5)

## 2013-12-19 LAB — HIV 1/2 CONFIRMATION
HIV-1 antibody: POSITIVE — AB
HIV-2 Ab: NEGATIVE

## 2013-12-19 LAB — COMPREHENSIVE METABOLIC PANEL
ALT: 21 U/L (ref 0–35)
AST: 26 U/L (ref 0–37)
Albumin: 3.9 g/dL (ref 3.5–5.2)
Alkaline Phosphatase: 32 U/L — ABNORMAL LOW (ref 39–117)
BUN: 7 mg/dL (ref 6–23)
CALCIUM: 9.3 mg/dL (ref 8.4–10.5)
CHLORIDE: 104 meq/L (ref 96–112)
CO2: 19 mEq/L (ref 19–32)
CREATININE: 0.61 mg/dL (ref 0.50–1.10)
Glucose, Bld: 117 mg/dL — ABNORMAL HIGH (ref 70–99)
Potassium: 4.4 mEq/L (ref 3.5–5.3)
SODIUM: 135 meq/L (ref 135–145)
TOTAL PROTEIN: 8.3 g/dL (ref 6.0–8.3)
Total Bilirubin: 0.4 mg/dL (ref 0.2–1.2)

## 2013-12-19 LAB — HIV ANTIBODY (ROUTINE TESTING W REFLEX): HIV: REACTIVE — AB

## 2013-12-25 ENCOUNTER — Ambulatory Visit (INDEPENDENT_AMBULATORY_CARE_PROVIDER_SITE_OTHER): Payer: 59 | Admitting: Family Medicine

## 2013-12-25 ENCOUNTER — Ambulatory Visit: Payer: 59 | Admitting: Gynecology

## 2013-12-25 ENCOUNTER — Encounter: Payer: Self-pay | Admitting: Family Medicine

## 2013-12-25 ENCOUNTER — Telehealth: Payer: Self-pay | Admitting: Radiology

## 2013-12-25 VITALS — BP 124/76 | HR 108 | Temp 98.8°F | Resp 16 | Ht 63.75 in | Wt 235.0 lb

## 2013-12-25 DIAGNOSIS — G4700 Insomnia, unspecified: Secondary | ICD-10-CM

## 2013-12-25 DIAGNOSIS — B20 Human immunodeficiency virus [HIV] disease: Secondary | ICD-10-CM

## 2013-12-25 DIAGNOSIS — F430 Acute stress reaction: Secondary | ICD-10-CM

## 2013-12-25 NOTE — Telephone Encounter (Signed)
I called infectious disease clinic and made appt for her to see infectious disease clinic. Spoke to nurse Tammy referral was received, but she has not called patient yet. The appt is made for Dec 15th at 1:30. To you FYI

## 2013-12-25 NOTE — Progress Notes (Signed)
Subjective:    Patient ID: Kelly Hunter, female    DOB: 05/28/1990, 23 y.o.   MRN: [id number]  12/18/2013  Patient was called to come in   HPI This 23 y.o. female presents for 48 hour follow-up for STD screening.  STD screening on 12/16/13  HIV +.  Last HIV testing 03/2011 negative.  Admits to multiple sexual partners since that time.  Diagnosed with Chlamydia in 03/03/2012 treated.  Has undergone repeat GC and Chlam testing since 03/03/2012 with negative results.  Has refused HIV testing in the past year.     Review of Systems  Constitutional: Negative for fever, chills, diaphoresis, fatigue and unexpected weight change.  Gastrointestinal: Negative for abdominal pain.  Genitourinary: Negative for vaginal discharge and vaginal pain.  Skin: Negative for rash.    Past Medical History  Diagnosis Date  . Allergy     Allegra  . Asthma     mild; onset in childhood.  No hospitalizations.  . Eczema   . Acne   . Hidradenitis   . CIN I (cervical intraepithelial neoplasia I) 08/31/2013    colposcopy by Toney Rakes; s/p CO2 laser treatment.  Marland Kitchen VIN I (vulvar intraepithelial neoplasia I) 08/31/2013    s/p CO2 treatment; Fernandez/gyn.  . Genital warts 08/31/2013    Aldara treatment; Toney Rakes.  . Chlamydia 03/03/2012   Past Surgical History  Procedure Laterality Date  . Breast surgery  02/01/2008    Reduction  . Co2 laser application N/A 2/83/6629    Procedure: C02 laser of vagina and cervix;  Surgeon: Terrance Mass, MD;  Location: [facility];  Service: Gynecology;  Laterality: N/A;   Allergies  Allergen Reactions  . Other Nausea And Vomiting and Swelling    Tree Nuts (Almonds)  . Peanut-Containing Drug Products    Current Outpatient Prescriptions  Medication Sig Dispense Refill  . desogestrel-ethinyl estradiol (APRI,EMOQUETTE,SOLIA) 0.15-30 MG-MCG tablet Take 1 tablet by mouth daily. 2 Package 7  . fluconazole (DIFLUCAN) 150 MG tablet Take 1 tablet (150 mg total) by mouth once. 1 tablet 0   . imiquimod (ALDARA) 5 % cream Apply topically 3 (three) times a week. 12 each 2  . metroNIDAZOLE (FLAGYL) 500 MG tablet Take 1 tablet (500 mg total) by mouth 2 (two) times daily with a meal. DO NOT CONSUME ALCOHOL WHILE TAKING THIS MEDICATION. 14 tablet 0  . Triamcinolone Acetonide (TRIAMCINOLONE 0.1 % CREAM : EUCERIN) CREA Use as directed 480 each 11   No current facility-administered medications for this visit.       Objective:    BP 138/80 mmHg  Pulse 110  Temp(Src) 98.6 F (37 C) (Oral)  Resp 16  Ht 5' 3.5" (1.613 m)  Wt 231 lb (104.781 kg)  BMI 40.27 kg/m2  SpO2 100%  LMP 10/17/2013 Physical Exam  Constitutional: She is oriented to person, place, and time. She appears well-developed and well-nourished. No distress.  HENT:  Head: Normocephalic and atraumatic.  Eyes: Conjunctivae are normal. Pupils are equal, round, and reactive to light.  Neck: Normal range of motion. Neck supple.  Cardiovascular: Normal rate, regular rhythm and normal heart sounds.  Exam reveals no gallop and no friction rub.   No murmur heard. Pulmonary/Chest: Effort normal and breath sounds normal. She has no wheezes. She has no rales.  Neurological: She is alert and oriented to person, place, and time.  Skin: She is not diaphoretic.  Psychiatric: She has a normal mood and affect. Her behavior is normal.  Nursing note and  vitals reviewed.  Results for orders placed or performed in visit on 12/18/13  CBC with Differential  Result Value Ref Range   WBC 8.1 4.0 - 10.5 K/uL   RBC 4.77 3.87 - 5.11 MIL/uL   Hemoglobin 13.0 12.0 - 15.0 g/dL   HCT 37.3 36.0 - 46.0 %   MCV 78.2 78.0 - 100.0 fL   MCH 27.3 26.0 - 34.0 pg   MCHC 34.9 30.0 - 36.0 g/dL   RDW 15.6 (H) 11.5 - 15.5 %   Platelets 343 150 - 400 K/uL   MPV 9.5 9.4 - 12.4 fL   Neutrophils Relative % 66 43 - 77 %   Neutro Abs 5.3 1.7 - 7.7 K/uL   Lymphocytes Relative 25 12 - 46 %   Lymphs Abs 2.0 0.7 - 4.0 K/uL   Monocytes Relative 8 3 - 12 %     Monocytes Absolute 0.6 0.1 - 1.0 K/uL   Eosinophils Relative 1 0 - 5 %   Eosinophils Absolute 0.1 0.0 - 0.7 K/uL   Basophils Relative 0 0 - 1 %   Basophils Absolute 0.0 0.0 - 0.1 K/uL   Smear Review Criteria for review not met   Comprehensive metabolic panel  Result Value Ref Range   Sodium 135 135 - 145 mEq/L   Potassium 4.4 3.5 - 5.3 mEq/L   Chloride 104 96 - 112 mEq/L   CO2 19 19 - 32 mEq/L   Glucose, Bld 117 (H) 70 - 99 mg/dL   BUN 7 6 - 23 mg/dL   Creat 0.61 0.50 - 1.10 mg/dL   Total Bilirubin 0.4 0.2 - 1.2 mg/dL   Alkaline Phosphatase 32 (L) 39 - 117 U/L   AST 26 0 - 37 U/L   ALT 21 0 - 35 U/L   Total Protein 8.3 6.0 - 8.3 g/dL   Albumin 3.9 3.5 - 5.2 g/dL   Calcium 9.3 8.4 - 10.5 mg/dL  HIV antibody  Result Value Ref Range   HIV 1&2 Ab, 4th Generation REACTIVE (A) NONREACTIVE  HIV 1/2 confirmation  Result Value Ref Range   HIV-1 antibody POSITIVE (A) NEGATIVE   HIV-2 Ab NEGATIVE NEGATIVE       Assessment & Plan:   1. High risk sexual behavior   2. Asymptomatic HIV infection      1.  High risk sexual behavior:  New.  HIV+ on recent STD screening on 12/16/13. 2.  Asymptomatic HIV:  New.  Extensive counseling provided during visit; refer for ID consultation.  Recommend contacting therapist.  Also will have pt follow-up in one week.   No orders of the defined types were placed in this encounter.    No Follow-up on file.    Reginia Forts, M.D.  [facility] [address] [place], [place]  [id number] [phone] phone [phone] fax

## 2013-12-25 NOTE — Patient Instructions (Signed)
Take melatonin 3 mg at bedtime at the same time each night. Continue to work at good sleep hygiene.

## 2013-12-28 NOTE — Progress Notes (Signed)
History and physical examinations obtained with Philis Fendt, PA-C. I agree with his assessment and plan.  Additionally:  S:  Patient requesting third HPV vaccine to be administered today; she has received first two vaccines at GYN office.  She follows up with gynecology tomorrow for genital warts; she has recently been treated for CIN-I of the cervix at 12:00 position and VIN-1 and condylomata fourchette and perineum.  O:  Abd: soft, NT, ND, no masses; BSNA.  GU: labia normal without erythema, lesions, or rash.  Vaginal area with scant white thin discharge.  Cervix without lesions or discharge; no CMT.  No bleeding from Os.  A/P:  1. BV: New. Rx for Metronidazole provided; no alcohol while using medication.  2. Candidiasis vulvovaginal:  New. Rx for Diflucan provided to use after Metronidazole.  3.  Insomnia: New. Recommend good sleep hygiene. Consider Melatonin qhs.  4.  High risk sexual behavior: obtain GC/Chlam/RPR/HIV in office today. No recent sexual activity in six months.  5. S/p Gardisil #3 in office.

## 2013-12-29 NOTE — Progress Notes (Signed)
Subjective:    Patient ID: Kelly Hunter, female    DOB: 22-Jan-1991, 23 y.o.   MRN: [id number]  12/25/2013  1 week f/u positive testing   HPI This 23 y.o. female presents with mother and father to discuss newly diagnosed HIV.  Pt presented 12/16/13 for STD screen; HIV testing +. Repeat testing on 12/18/13 again positive.  Diagnosis discussed with patient on 12/18/13.  Scheduled one week follow-up to see how coping emotionally. Pt reports that she is doing well overall.  Missed one day of work only; has been going to work for the past week.  Informed parents and very close friends and family of diagnosis.  Agreeable to undergo psychotherapy in early December.  Mother and father both initially very upset with news but now in acceptance phase and want to determine appropriate evaluation and treatment for pt.  Parents are concerned with Kelly Hunter's well being.    Patient complaining of insomnia and sleep disturbance; this has been ongoing for some time. Patient has taken one of mother's Ambien with good results.  Schedule is constantly changing due to work schedule. She frequently works late/second shift.     Treated for Chlamydia in 03/2012; diagnosed in 08/2013 with CIN-I and VIN-I and genital warts by gynecology; has recently been treated CO2 laser treatment for CIN-I and VIN-I and Aldara for genital warts by Dr. Toney Rakes.   Review of Systems  Constitutional: Negative for fever, chills, diaphoresis, activity change, appetite change and fatigue.  Respiratory: Negative for shortness of breath.   Cardiovascular: Negative for chest pain.  Gastrointestinal: Negative for nausea, vomiting, abdominal pain and diarrhea.  Genitourinary: Negative for dysuria, urgency, frequency, genital sores and pelvic pain.  Skin: Negative for rash.  Psychiatric/Behavioral: Positive for sleep disturbance and dysphoric mood. Negative for suicidal ideas and self-injury. The patient is nervous/anxious.     Past  Medical History  Diagnosis Date  . Allergy     Allegra  . Asthma     mild; onset in childhood.  No hospitalizations.  . Eczema   . Acne   . Hidradenitis   . CIN I (cervical intraepithelial neoplasia I) 08/31/2013    colposcopy by Toney Rakes; s/p CO2 laser treatment.  Marland Kitchen VIN I (vulvar intraepithelial neoplasia I) 08/31/2013    s/p CO2 treatment; Fernandez/gyn.  . Genital warts 08/31/2013    Aldara treatment; Toney Rakes.  . Chlamydia 03/03/2012   Past Surgical History  Procedure Laterality Date  . Breast surgery  02/01/2008    Reduction  . Co2 laser application N/A 8/91/6945    Procedure: C02 laser of vagina and cervix;  Surgeon: Terrance Mass, MD;  Location: [facility];  Service: Gynecology;  Laterality: N/A;   Allergies  Allergen Reactions  . Other Nausea And Vomiting and Swelling    Tree Nuts (Almonds)  . Peanut-Containing Drug Products    Current Outpatient Prescriptions  Medication Sig Dispense Refill  . desogestrel-ethinyl estradiol (APRI,EMOQUETTE,SOLIA) 0.15-30 MG-MCG tablet Take 1 tablet by mouth daily. 2 Package 7  . fluconazole (DIFLUCAN) 150 MG tablet Take 1 tablet (150 mg total) by mouth once. 1 tablet 0  . imiquimod (ALDARA) 5 % cream Apply topically 3 (three) times a week. 12 each 2  . metroNIDAZOLE (FLAGYL) 500 MG tablet Take 1 tablet (500 mg total) by mouth 2 (two) times daily with a meal. DO NOT CONSUME ALCOHOL WHILE TAKING THIS MEDICATION. 14 tablet 0  . Triamcinolone Acetonide (TRIAMCINOLONE 0.1 % CREAM : EUCERIN) CREA Use as directed 480 each  11   No current facility-administered medications for this visit.       Objective:    BP 124/76 mmHg  Pulse 108  Temp(Src) 98.8 F (37.1 C) (Oral)  Resp 16  Ht 5' 3.75" (1.619 m)  Wt 235 lb (106.595 kg)  BMI 40.67 kg/m2  SpO2 97%  LMP 10/17/2013 Physical Exam  Constitutional: She is oriented to person, place, and time. She appears well-developed and well-nourished. No distress.  HENT:  Head: Normocephalic and  atraumatic.  Eyes: Conjunctivae are normal. Pupils are equal, round, and reactive to light.  Neck: Normal range of motion. Neck supple.  Cardiovascular: Normal rate, regular rhythm and normal heart sounds.  Exam reveals no gallop and no friction rub.   No murmur heard. Pulmonary/Chest: Effort normal and breath sounds normal. She has no wheezes. She has no rales.  Neurological: She is alert and oriented to person, place, and time.  Skin: She is not diaphoretic.  Psychiatric: She has a normal mood and affect. Her behavior is normal. Judgment and thought content normal.  Nursing note and vitals reviewed.  Results for orders placed or performed in visit on 12/18/13  CBC with Differential  Result Value Ref Range   WBC 8.1 4.0 - 10.5 K/uL   RBC 4.77 3.87 - 5.11 MIL/uL   Hemoglobin 13.0 12.0 - 15.0 g/dL   HCT 37.3 36.0 - 46.0 %   MCV 78.2 78.0 - 100.0 fL   MCH 27.3 26.0 - 34.0 pg   MCHC 34.9 30.0 - 36.0 g/dL   RDW 15.6 (H) 11.5 - 15.5 %   Platelets 343 150 - 400 K/uL   MPV 9.5 9.4 - 12.4 fL   Neutrophils Relative % 66 43 - 77 %   Neutro Abs 5.3 1.7 - 7.7 K/uL   Lymphocytes Relative 25 12 - 46 %   Lymphs Abs 2.0 0.7 - 4.0 K/uL   Monocytes Relative 8 3 - 12 %   Monocytes Absolute 0.6 0.1 - 1.0 K/uL   Eosinophils Relative 1 0 - 5 %   Eosinophils Absolute 0.1 0.0 - 0.7 K/uL   Basophils Relative 0 0 - 1 %   Basophils Absolute 0.0 0.0 - 0.1 K/uL   Smear Review Criteria for review not met   Comprehensive metabolic panel  Result Value Ref Range   Sodium 135 135 - 145 mEq/L   Potassium 4.4 3.5 - 5.3 mEq/L   Chloride 104 96 - 112 mEq/L   CO2 19 19 - 32 mEq/L   Glucose, Bld 117 (H) 70 - 99 mg/dL   BUN 7 6 - 23 mg/dL   Creat 0.61 0.50 - 1.10 mg/dL   Total Bilirubin 0.4 0.2 - 1.2 mg/dL   Alkaline Phosphatase 32 (L) 39 - 117 U/L   AST 26 0 - 37 U/L   ALT 21 0 - 35 U/L   Total Protein 8.3 6.0 - 8.3 g/dL   Albumin 3.9 3.5 - 5.2 g/dL   Calcium 9.3 8.4 - 10.5 mg/dL  HIV antibody  Result  Value Ref Range   HIV 1&2 Ab, 4th Generation REACTIVE (A) NONREACTIVE  HIV 1/2 confirmation  Result Value Ref Range   HIV-1 antibody POSITIVE (A) NEGATIVE   HIV-2 Ab NEGATIVE NEGATIVE       Assessment & Plan:   1. HIV disease   2. Acute stress reaction   3. Insomnia      1. HIV-1:  Newly diagnosis; appointment with Infectious Disease in upcoming three weeks.  Answered questions during visit.  Coping relatively well with new diagnosis. 2.  Acute stress reaction: stable; coping relatively well with new dx of HIV.  Both parents very supportive and present for visit today. 3. Insomnia: New.  Recommend regular sleep pattern and reviewed good sleep hygiene.  Recommend Melatonin qhs.  Consider trial of Trazodone qhs; avoid Ambien in young female.   No orders of the defined types were placed in this encounter.    No Follow-up on file.    Reginia Forts, M.D.  [facility] [address] [place],   [address] [phone] phone [phone] fax

## 2013-12-30 ENCOUNTER — Telehealth: Payer: Self-pay

## 2013-12-30 ENCOUNTER — Ambulatory Visit (INDEPENDENT_AMBULATORY_CARE_PROVIDER_SITE_OTHER): Payer: 59 | Admitting: Gynecology

## 2013-12-30 ENCOUNTER — Encounter: Payer: Self-pay | Admitting: Gynecology

## 2013-12-30 VITALS — BP 120/80

## 2013-12-30 DIAGNOSIS — A630 Anogenital (venereal) warts: Secondary | ICD-10-CM

## 2013-12-30 DIAGNOSIS — Z21 Asymptomatic human immunodeficiency virus [HIV] infection status: Secondary | ICD-10-CM | POA: Insufficient documentation

## 2013-12-30 NOTE — Telephone Encounter (Signed)
Patient contacted regarding new intake appointment. Date and time given.  Information given regarding documents needed to qualify for financial eligibility if needed.  Laverle Patter, RN

## 2013-12-30 NOTE — Progress Notes (Signed)
   Patient was seen the office on 09/19/2013 for six-week postop visit. Patient was status post CO2 laser ablation of vulvar and cervical dysplasia and vulvar condyloma acuminatum as follows:  The CIN-1 cervix 12:00 position, VIn-1 and condylomata fourchette and perineum  It was noted during that visit that the area of the perineum whereby the CO2 laser was performed has healed completely but in the inner aspect of the fourchette there were 2 condylomatous areas still present. On speculum exam there were no cervical or vaginal lesions endocervical area had healed from the previous CO2 laser.  She was treated at that visit on August 20 with 50% TCA and was prescribed Aldara cream to apply 3 times a week for 3 months in follow-up visit reason she is here for today.  On November 17 there was a small area still present at the fourchette and she was treated with 50% TCA returns today for follow-up. On examination the area is much smaller so another treatment of 50% TCA was applied. She has 3 days left of the Aldara which she will start to use next week and this will completely cleared the little that was noted today.  Patient was recently diagnosed with HIV and her PCP has already made arrangements for her to begin treatment and she has appointment tomorrow.

## 2013-12-31 ENCOUNTER — Ambulatory Visit (INDEPENDENT_AMBULATORY_CARE_PROVIDER_SITE_OTHER): Payer: 59

## 2013-12-31 DIAGNOSIS — Z23 Encounter for immunization: Secondary | ICD-10-CM

## 2013-12-31 DIAGNOSIS — B20 Human immunodeficiency virus [HIV] disease: Secondary | ICD-10-CM

## 2014-01-01 LAB — COMPLETE METABOLIC PANEL WITH GFR
ALBUMIN: 3.9 g/dL (ref 3.5–5.2)
ALT: 21 U/L (ref 0–35)
AST: 25 U/L (ref 0–37)
Alkaline Phosphatase: 35 U/L — ABNORMAL LOW (ref 39–117)
BUN: 8 mg/dL (ref 6–23)
CO2: 26 mEq/L (ref 19–32)
CREATININE: 0.6 mg/dL (ref 0.50–1.10)
Calcium: 9.1 mg/dL (ref 8.4–10.5)
Chloride: 102 mEq/L (ref 96–112)
GFR, Est African American: >89 mL/min
GFR, Est Non African American: >89 mL/min
Glucose, Bld: 113 mg/dL — ABNORMAL HIGH (ref 70–99)
POTASSIUM: 4.3 meq/L (ref 3.5–5.3)
Sodium: 136 mEq/L (ref 135–145)
Total Bilirubin: 0.3 mg/dL (ref 0.2–1.2)
Total Protein: 8.5 g/dL — ABNORMAL HIGH (ref 6.0–8.3)

## 2014-01-01 LAB — HEPATITIS A ANTIBODY, TOTAL: Hep A Total Ab: NONREACTIVE

## 2014-01-01 LAB — URINALYSIS
Glucose, UA: NEGATIVE mg/dL
KETONES UR: 15 mg/dL — AB
Nitrite: POSITIVE — AB
Protein, ur: 100 mg/dL — AB
Specific Gravity, Urine: 1.024 (ref 1.005–1.030)
UROBILINOGEN UA: 1 mg/dL (ref 0.0–1.0)
pH: 6.5 (ref 5.0–8.0)

## 2014-01-01 LAB — HEPATITIS B SURFACE ANTIBODY,QUALITATIVE: Hep B S Ab: POSITIVE — AB

## 2014-01-01 LAB — LIPID PANEL
Cholesterol: 165 mg/dL (ref 0–200)
HDL: 45 mg/dL (ref >39)
LDL CALC: 101 mg/dL — AB (ref 0–99)
Total CHOL/HDL Ratio: 3.7 Ratio
Triglycerides: 95 mg/dL (ref <150)
VLDL: 19 mg/dL (ref 0–40)

## 2014-01-01 LAB — CBC WITH DIFFERENTIAL/PLATELET
BASOS ABS: 0.1 10*3/uL (ref 0.0–0.1)
Basophils Relative: 1 % (ref 0–1)
Eosinophils Absolute: 0.2 10*3/uL (ref 0.0–0.7)
Eosinophils Relative: 3 % (ref 0–5)
HCT: 36.2 % (ref 36.0–46.0)
Hemoglobin: 12.6 g/dL (ref 12.0–15.0)
Lymphocytes Relative: 29 % (ref 12–46)
Lymphs Abs: 2 10*3/uL (ref 0.7–4.0)
MCH: 26.9 pg (ref 26.0–34.0)
MCHC: 34.8 g/dL (ref 30.0–36.0)
MCV: 77.2 fL — AB (ref 78.0–100.0)
MONO ABS: 0.5 10*3/uL (ref 0.1–1.0)
MPV: 9.3 fL — ABNORMAL LOW (ref 9.4–12.4)
Monocytes Relative: 8 % (ref 3–12)
Neutro Abs: 4 10*3/uL (ref 1.7–7.7)
Neutrophils Relative %: 59 % (ref 43–77)
Platelets: 340 10*3/uL (ref 150–400)
RBC: 4.69 MIL/uL (ref 3.87–5.11)
RDW: 16.1 % — AB (ref 11.5–15.5)
WBC: 6.8 10*3/uL (ref 4.0–10.5)

## 2014-01-01 LAB — HEPATITIS B CORE ANTIBODY, TOTAL: HEP B C TOTAL AB: NONREACTIVE

## 2014-01-01 LAB — HEPATITIS B SURFACE ANTIGEN: HEP B S AG: NEGATIVE

## 2014-01-01 LAB — RPR

## 2014-01-01 LAB — HEPATITIS C ANTIBODY: HCV AB: NEGATIVE

## 2014-01-02 LAB — HIV-1 RNA ULTRAQUANT REFLEX TO GENTYP+
HIV 1 RNA QUANT: 23388 {copies}/mL — AB (ref <20)
HIV-1 RNA QUANT, LOG: 4.37 {Log} — AB (ref <1.30)

## 2014-01-02 LAB — QUANTIFERON TB GOLD ASSAY (BLOOD)
Interferon Gamma Release Assay: NEGATIVE
Mitogen value: 3.74 IU/mL
Quantiferon Nil Value: 0.03 IU/mL
Quantiferon Tb Ag Minus Nil Value: 0 IU/mL
TB Ag value: 0.03 IU/mL

## 2014-01-02 LAB — URINE CYTOLOGY ANCILLARY ONLY
CHLAMYDIA, DNA PROBE: NEGATIVE
Neisseria Gonorrhea: NEGATIVE

## 2014-01-02 LAB — T-HELPER CELL (CD4) - (RCID CLINIC ONLY)
CD4 T CELL ABS: 420 /uL (ref 400–2700)
CD4 T CELL HELPER: 21 % — AB (ref 33–55)

## 2014-01-02 NOTE — Progress Notes (Signed)
Patient here today for her first HIV intake visit, she was newly diagnosed two weeks ago by her primary care provider. She is here today with her Mother, whom she states his a very good support system. She states that she tested negative last year, and has not had any partners in the past 6 months. Patient states that she is feeling better after the initial shock of her diagnosis. Patient asked very good questions concerning medications, she asked about her CD4 and viral load and was optimistic about her future. Patient has a return visit in 2 weeks with Dr. Tommy Medal. Vaccines were updated and records received via epic.

## 2014-01-13 LAB — HIV-1 GENOTYPR PLUS

## 2014-01-14 ENCOUNTER — Ambulatory Visit: Payer: 59

## 2014-01-15 ENCOUNTER — Encounter: Payer: Self-pay | Admitting: Infectious Disease

## 2014-01-15 ENCOUNTER — Ambulatory Visit (INDEPENDENT_AMBULATORY_CARE_PROVIDER_SITE_OTHER): Payer: 59 | Admitting: Infectious Disease

## 2014-01-15 VITALS — BP 123/84 | HR 89 | Temp 98.4°F | Wt 236.0 lb

## 2014-01-15 DIAGNOSIS — F1210 Cannabis abuse, uncomplicated: Secondary | ICD-10-CM

## 2014-01-15 DIAGNOSIS — IMO0002 Reserved for concepts with insufficient information to code with codable children: Secondary | ICD-10-CM

## 2014-01-15 DIAGNOSIS — R896 Abnormal cytological findings in specimens from other organs, systems and tissues: Secondary | ICD-10-CM

## 2014-01-15 DIAGNOSIS — B20 Human immunodeficiency virus [HIV] disease: Secondary | ICD-10-CM

## 2014-01-15 DIAGNOSIS — F1290 Cannabis use, unspecified, uncomplicated: Secondary | ICD-10-CM

## 2014-01-15 MED ORDER — DOLUTEGRAVIR SODIUM 50 MG PO TABS: 50.0000 mg | ORAL_TABLET | Freq: Every day | ORAL | Status: DC

## 2014-01-15 MED ORDER — EMTRICITABINE-TENOFOVIR DF 200-300 MG PO TABS: 1.0000 | ORAL_TABLET | Freq: Every day | ORAL | Status: DC

## 2014-01-15 NOTE — Progress Notes (Signed)
Subjective:    Patient ID: Kelly Hunter, female    DOB: Feb 13, 1990, 23 y.o.   MRN: [id number]  HPI  23 year old African-American lady recently diagnosed with HIV infection. Of note she tested negative for HIV in 2010. Fiber 2014 she had an acute illness of 4-6 weeks and was tested for rheumatic fever viral hepatitis panel.  I suspect that point in time that she actually was succumbing to acute HIV infection. She has now been found to have HIV on testing in November with her primary care physician. Her viral load is  Lab Results  Component Value Date   HIV1RNAQUANT 23388* 12/31/2013   And she has wild type virus by genotype testing although her integrase mutations were not assessed.  Her CD4 is  Lab Results  Component Value Date   CD4TABS 420 12/31/2013   I had an exhaustive conversation with patient her mother and father who are also present for the interview. We reviewed all of the first line regimens for HIV. After extensive discussion we decided to go with TIVICAY and Truvada this regimen will be clean will not interact with her oral contraceptives and will have minimal side effects. We can continue to consider changing her to [facility] down the road but we do not know her HLA status.   Review of Systems  Constitutional: Negative for fever, chills, diaphoresis, activity change, appetite change, fatigue and unexpected weight change.  HENT: Negative for congestion, rhinorrhea, sinus pressure, sneezing, sore throat and trouble swallowing.   Eyes: Negative for photophobia and visual disturbance.  Respiratory: Negative for cough, chest tightness, shortness of breath, wheezing and stridor.   Cardiovascular: Negative for chest pain, palpitations and leg swelling.  Gastrointestinal: Negative for nausea, vomiting, abdominal pain, diarrhea, constipation, blood in stool, abdominal distention and anal bleeding.  Genitourinary: Negative for dysuria, hematuria, flank pain and difficulty  urinating.  Musculoskeletal: Negative for myalgias, back pain, joint swelling, arthralgias and gait problem.  Skin: Negative for color change, pallor, rash and wound.  Neurological: Negative for dizziness, tremors, weakness and light-headedness.  Hematological: Negative for adenopathy. Does not bruise/bleed easily.  Psychiatric/Behavioral: Negative for behavioral problems, confusion, sleep disturbance, dysphoric mood, decreased concentration and agitation.       Objective:   Physical Exam  Constitutional: She is oriented to person, place, and time. She appears well-developed and well-nourished. No distress.  HENT:  Head: Normocephalic and atraumatic.  Mouth/Throat: No oropharyngeal exudate.  Eyes: Conjunctivae and EOM are normal. No scleral icterus.  Neck: Normal range of motion. Neck supple.  Cardiovascular: Normal rate and regular rhythm.   Pulmonary/Chest: Effort normal. No respiratory distress. She has no wheezes.  Abdominal: She exhibits no distension.  Musculoskeletal: She exhibits no edema or tenderness.  Neurological: She is alert and oriented to person, place, and time. She exhibits normal muscle tone. Coordination normal.  Skin: Skin is warm and dry. No rash noted. She is not diaphoretic. No erythema. No pallor.  Psychiatric: She has a normal mood and affect. Her behavior is normal. Judgment and thought content normal.          Assessment & Plan:   HIV: Start TIVICAY and Truvada recheck viral load and CD4 count in 4 weeks time along with an HLA test in case we want to change her to [facility] at that time. Vaccinate her for hepatitis A. I spent greater than 60 minutes with the patient including greater than 50% of time in face to face counsel of the patient and in coordination  of their care.  Hx of ASCUS: will need regular pap smears and close followup. Also had HPV + of vulva  Marijuana use: not major problem for me I am glad she is not smoking tobacco.

## 2014-01-28 ENCOUNTER — Other Ambulatory Visit: Payer: Self-pay | Admitting: *Deleted

## 2014-01-28 DIAGNOSIS — B20 Human immunodeficiency virus [HIV] disease: Secondary | ICD-10-CM

## 2014-01-28 MED ORDER — EMTRICITABINE-TENOFOVIR DF 200-300 MG PO TABS: 1.0000 | ORAL_TABLET | Freq: Every day | ORAL | Status: DC

## 2014-01-28 MED ORDER — DOLUTEGRAVIR SODIUM 50 MG PO TABS: 50.0000 mg | ORAL_TABLET | Freq: Every day | ORAL | Status: DC

## 2014-02-10 ENCOUNTER — Other Ambulatory Visit: Payer: Self-pay | Admitting: *Deleted

## 2014-02-10 NOTE — Telephone Encounter (Signed)
Previously refilled on 01/18/14.  [facility]'s computer system did not process the refill sent to them.  RN requested that the medications be "over-nighted" to the pt due to the pt's either being out or running low on her medications.  [facility]. Pt informed that rxes being over-nighted today.  Pt verbalized appreciation.

## 2014-02-11 ENCOUNTER — Other Ambulatory Visit: Payer: Self-pay | Admitting: Licensed Clinical Social Worker

## 2014-02-11 DIAGNOSIS — B20 Human immunodeficiency virus [HIV] disease: Secondary | ICD-10-CM

## 2014-02-11 MED ORDER — EMTRICITABINE-TENOFOVIR DF 200-300 MG PO TABS: 1.0000 | ORAL_TABLET | Freq: Every day | ORAL | Status: DC

## 2014-02-11 MED ORDER — DOLUTEGRAVIR SODIUM 50 MG PO TABS: 50.0000 mg | ORAL_TABLET | Freq: Every day | ORAL | Status: DC

## 2014-02-12 ENCOUNTER — Other Ambulatory Visit: Payer: 59

## 2014-02-12 DIAGNOSIS — B20 Human immunodeficiency virus [HIV] disease: Secondary | ICD-10-CM

## 2014-02-12 LAB — CBC WITH DIFFERENTIAL/PLATELET
BASOS ABS: 0.1 10*3/uL (ref 0.0–0.1)
BASOS PCT: 1 % (ref 0–1)
Eosinophils Absolute: 0.2 10*3/uL (ref 0.0–0.7)
Eosinophils Relative: 2 % (ref 0–5)
HEMATOCRIT: 35.7 % — AB (ref 36.0–46.0)
HEMOGLOBIN: 12.2 g/dL (ref 12.0–15.0)
Lymphocytes Relative: 25 % (ref 12–46)
Lymphs Abs: 1.9 10*3/uL (ref 0.7–4.0)
MCH: 27.5 pg (ref 26.0–34.0)
MCHC: 34.2 g/dL (ref 30.0–36.0)
MCV: 80.4 fL (ref 78.0–100.0)
MONOS PCT: 9 % (ref 3–12)
MPV: 9.1 fL (ref 8.6–12.4)
Monocytes Absolute: 0.7 10*3/uL (ref 0.1–1.0)
Neutro Abs: 4.8 10*3/uL (ref 1.7–7.7)
Neutrophils Relative %: 63 % (ref 43–77)
Platelets: 366 10*3/uL (ref 150–400)
RBC: 4.44 MIL/uL (ref 3.87–5.11)
RDW: 16 % — ABNORMAL HIGH (ref 11.5–15.5)
WBC: 7.6 10*3/uL (ref 4.0–10.5)

## 2014-02-12 LAB — COMPLETE METABOLIC PANEL WITH GFR
ALT: 14 U/L (ref 0–35)
AST: 15 U/L (ref 0–37)
Albumin: 3.6 g/dL (ref 3.5–5.2)
Alkaline Phosphatase: 35 U/L — ABNORMAL LOW (ref 39–117)
BUN: 8 mg/dL (ref 6–23)
CO2: 26 mEq/L (ref 19–32)
Calcium: 9 mg/dL (ref 8.4–10.5)
Chloride: 104 mEq/L (ref 96–112)
Creat: 0.72 mg/dL (ref 0.50–1.10)
GFR, Est African American: >89 mL/min
Glucose, Bld: 115 mg/dL — ABNORMAL HIGH (ref 70–99)
Potassium: 4.2 mEq/L (ref 3.5–5.3)
Sodium: 137 mEq/L (ref 135–145)
Total Bilirubin: 0.3 mg/dL (ref 0.2–1.2)
Total Protein: 7.4 g/dL (ref 6.0–8.3)

## 2014-02-14 LAB — HIV-1 RNA ULTRAQUANT REFLEX TO GENTYP+
HIV 1 RNA Quant: 24 copies/mL — ABNORMAL HIGH (ref <20)
HIV-1 RNA QUANT, LOG: 1.38 {Log} — AB (ref <1.30)

## 2014-02-14 LAB — T-HELPER CELL (CD4) - (RCID CLINIC ONLY)
CD4 % Helper T Cell: 26 % — ABNORMAL LOW (ref 33–55)
CD4 T Cell Abs: 480 /uL (ref 400–2700)

## 2014-02-17 LAB — HLA B*5701: HLA-B*5701 w/rflx HLA-B High: NEGATIVE

## 2014-02-19 ENCOUNTER — Encounter: Payer: Self-pay | Admitting: Infectious Disease

## 2014-02-19 ENCOUNTER — Ambulatory Visit (INDEPENDENT_AMBULATORY_CARE_PROVIDER_SITE_OTHER): Payer: 59 | Admitting: Infectious Disease

## 2014-02-19 VITALS — BP 118/81 | HR 97 | Temp 98.1°F | Wt 240.0 lb

## 2014-02-19 DIAGNOSIS — R896 Abnormal cytological findings in specimens from other organs, systems and tissues: Secondary | ICD-10-CM

## 2014-02-19 DIAGNOSIS — B20 Human immunodeficiency virus [HIV] disease: Secondary | ICD-10-CM

## 2014-02-19 DIAGNOSIS — IMO0002 Reserved for concepts with insufficient information to code with codable children: Secondary | ICD-10-CM

## 2014-02-19 MED ORDER — ABACAVIR-DOLUTEGRAVIR-LAMIVUD 600-50-300 MG PO TABS: 1.0000 | ORAL_TABLET | Freq: Every day | ORAL | Status: DC

## 2014-02-19 NOTE — Progress Notes (Signed)
   Subjective:    Patient ID: Kelly Hunter, female    DOB: Oct 27, 1990, 24 y.o.   MRN: [id number]  HPI   24 year old African-American lady recently diagnosed with HIV infection. Of note she tested negative for HIV in 2010. In February of  2014 she had an acute illness of 4-6 weeks and was tested for rheumatic fever viral hepatitis panel.  I suspect that point in time that she actually was succumbing to acute HIV infection. She has now been found to have HIV on testing in November with her primary care physician. We saw her one month ago and started [place]. She likes this regimen very much and has near undetectable viral load and healthy CD4 count.    Lab Results  Component Value Date   HIV1RNAQUANT 24* 02/12/2014     Her CD4 is  Lab Results  Component Value Date   CD4TABS 480 02/12/2014   CD4TABS 420 12/31/2013   Will change to [place] today. No Hep B coinfection.   Review of Systems  Constitutional: Negative for fever, chills, diaphoresis, activity change, appetite change, fatigue and unexpected weight change.  HENT: Negative for congestion, rhinorrhea, sinus pressure, sneezing, sore throat and trouble swallowing.   Eyes: Negative for photophobia and visual disturbance.  Respiratory: Negative for cough, chest tightness, shortness of breath, wheezing and stridor.   Cardiovascular: Negative for chest pain, palpitations and leg swelling.  Gastrointestinal: Negative for nausea, vomiting, abdominal pain, diarrhea, constipation, blood in stool, abdominal distention and anal bleeding.  Genitourinary: Negative for dysuria, hematuria, flank pain and difficulty urinating.  Musculoskeletal: Negative for myalgias, back pain, joint swelling, arthralgias and gait problem.  Skin: Negative for color change, pallor, rash and wound.  Neurological: Negative for dizziness, tremors, weakness and light-headedness.  Hematological: Negative for adenopathy. Does not bruise/bleed easily.    Psychiatric/Behavioral: Negative for behavioral problems, confusion, sleep disturbance, dysphoric mood, decreased concentration and agitation.       Objective:   Physical Exam  Constitutional: She is oriented to person, place, and time. She appears well-developed and well-nourished. No distress.  HENT:  Head: Normocephalic and atraumatic.  Mouth/Throat: No oropharyngeal exudate.  Eyes: Conjunctivae and EOM are normal. No scleral icterus.  Neck: Normal range of motion. Neck supple.  Cardiovascular: Normal rate and regular rhythm.   Pulmonary/Chest: Effort normal. No respiratory distress. She has no wheezes.  Abdominal: She exhibits no distension.  Musculoskeletal: She exhibits no edema or tenderness.  Neurological: She is alert and oriented to person, place, and time. She exhibits normal muscle tone. Coordination normal.  Skin: Skin is warm and dry. No rash noted. She is not diaphoretic. No erythema. No pallor.  Psychiatric: She has a normal mood and affect. Her behavior is normal. Judgment and thought content normal.          Assessment & Plan:  HIV: change her to [facility] at that time. Vaccinate her for hepatitis A. I spent greater than 25 minutes with the patient including greater than 50% of time in face to face counsel of the patient and in coordination of their care.  Hx of ASCUS: will need regular pap smears and close followup. Also had HPV + of vulva  Marijuana use: not major problem for me I am glad she is not smoking tobacco.  Slightly elevated BG on her labs, but they were not fasting, consider A1c down the road

## 2014-02-20 LAB — HIV-1 INTEGRASE GENOTYPE

## 2014-04-16 ENCOUNTER — Ambulatory Visit (INDEPENDENT_AMBULATORY_CARE_PROVIDER_SITE_OTHER): Payer: 59 | Admitting: Family Medicine

## 2014-04-16 ENCOUNTER — Encounter: Payer: Self-pay | Admitting: Family Medicine

## 2014-04-16 VITALS — BP 120/80 | HR 98 | Temp 99.1°F | Resp 16 | Ht 63.75 in | Wt 240.0 lb

## 2014-04-16 DIAGNOSIS — F430 Acute stress reaction: Secondary | ICD-10-CM | POA: Diagnosis not present

## 2014-04-16 DIAGNOSIS — R739 Hyperglycemia, unspecified: Secondary | ICD-10-CM | POA: Diagnosis not present

## 2014-04-16 DIAGNOSIS — N9089 Other specified noninflammatory disorders of vulva and perineum: Secondary | ICD-10-CM | POA: Diagnosis not present

## 2014-04-16 DIAGNOSIS — N900 Mild vulvar dysplasia: Secondary | ICD-10-CM

## 2014-04-16 DIAGNOSIS — G4700 Insomnia, unspecified: Secondary | ICD-10-CM | POA: Diagnosis not present

## 2014-04-16 DIAGNOSIS — L732 Hidradenitis suppurativa: Secondary | ICD-10-CM

## 2014-04-16 DIAGNOSIS — E669 Obesity, unspecified: Secondary | ICD-10-CM | POA: Diagnosis not present

## 2014-04-16 DIAGNOSIS — B20 Human immunodeficiency virus [HIV] disease: Secondary | ICD-10-CM

## 2014-04-16 MED ORDER — TRAZODONE HCL 50 MG PO TABS: 50.0000 mg | ORAL_TABLET | Freq: Every evening | ORAL | Status: DC | PRN

## 2014-04-16 NOTE — Patient Instructions (Signed)
1. Call Toney Rakes for appointment. 2.  Start Doxycycline; take with food and water for underarm folliculitis. 3.  Take Trazodone for sleep.  Insomnia Insomnia is frequent trouble falling and/or staying asleep. Insomnia can be a long term problem or a short term problem. Both are common. Insomnia can be a short term problem when the wakefulness is related to a certain stress or worry. Long term insomnia is often related to ongoing stress during waking hours and/or poor sleeping habits. Overtime, sleep deprivation itself can make the problem worse. Every little thing feels more severe because you are overtired and your ability to cope is decreased. CAUSES   Stress, anxiety, and depression.  Poor sleeping habits.  Distractions such as TV in the bedroom.  Naps close to bedtime.  Engaging in emotionally charged conversations before bed.  Technical reading before sleep.  Alcohol and other sedatives. They may make the problem worse. They can hurt normal sleep patterns and normal dream activity.  Stimulants such as caffeine for several hours prior to bedtime.  Pain syndromes and shortness of breath can cause insomnia.  Exercise late at night.  Changing time zones may cause sleeping problems (jet lag). It is sometimes helpful to have someone observe your sleeping patterns. They should look for periods of not breathing during the night (sleep apnea). They should also look to see how long those periods last. If you live alone or observers are uncertain, you can also be observed at a sleep clinic where your sleep patterns will be professionally monitored. Sleep apnea requires a checkup and treatment. Give your caregivers your medical history. Give your caregivers observations your family has made about your sleep.  SYMPTOMS   Not feeling rested in the morning.  Anxiety and restlessness at bedtime.  Difficulty falling and staying asleep. TREATMENT   Your caregiver may prescribe treatment  for an underlying medical disorders. Your caregiver can give advice or help if you are using alcohol or other drugs for self-medication. Treatment of underlying problems will usually eliminate insomnia problems.  Medications can be prescribed for short time use. They are generally not recommended for lengthy use.  Over-the-counter sleep medicines are not recommended for lengthy use. They can be habit forming.  You can promote easier sleeping by making lifestyle changes such as:  Using relaxation techniques that help with breathing and reduce muscle tension.  Exercising earlier in the day.  Changing your diet and the time of your last meal. No night time snacks.  Establish a regular time to go to bed.  Counseling can help with stressful problems and worry.  Soothing music and white noise may be helpful if there are background noises you cannot remove.  Stop tedious detailed work at least one hour before bedtime. HOME CARE INSTRUCTIONS   Keep a diary. Inform your caregiver about your progress. This includes any medication side effects. See your caregiver regularly. Take note of:  Times when you are asleep.  Times when you are awake during the night.  The quality of your sleep.  How you feel the next day. This information will help your caregiver care for you.  Get out of bed if you are still awake after 15 minutes. Read or do some quiet activity. Keep the lights down. Wait until you feel sleepy and go back to bed.  Keep regular sleeping and waking hours. Avoid naps.  Exercise regularly.  Avoid distractions at bedtime. Distractions include watching television or engaging in any intense or detailed activity like attempting to  balance the household checkbook.  Develop a bedtime ritual. Keep a familiar routine of bathing, brushing your teeth, climbing into bed at the same time each night, listening to soothing music. Routines increase the success of falling to sleep faster.  Use  relaxation techniques. This can be using breathing and muscle tension release routines. It can also include visualizing peaceful scenes. You can also help control troubling or intruding thoughts by keeping your mind occupied with boring or repetitive thoughts like the old concept of counting sheep. You can make it more creative like imagining planting one beautiful flower after another in your backyard garden.  During your day, work to eliminate stress. When this is not possible use some of the previous suggestions to help reduce the anxiety that accompanies stressful situations. MAKE SURE YOU:   Understand these instructions.  Will watch your condition.  Will get help right away if you are not doing well or get worse. Document Released: 01/15/2000 Document Revised: 04/11/2011 Document Reviewed: 02/14/2007 [facility] Patient Information 2015 [place], [place]. This information is not intended to replace advice given to you by your health care provider. Make sure you discuss any questions you have with your health care provider.

## 2014-04-16 NOTE — Progress Notes (Signed)
Subjective:    Patient ID: Kelly Hunter, female    DOB: 08/16/1990, 24 y.o.   MRN: [id number]  04/16/2014  Follow-up   HPI This 24 y.o. female presents for follow-up of recent HIV diagnosis in 12/2013.  S/p three office visits with Infectious Disease.  CD4 count is normal and viral load is undetectable.  Had some nausea with Triumeq; now taking it with juice on empty stomach with improvement.  Skips breakfast and lunch and goes straight to dinner and then goes straight to sleep.  Last visit in 01/2014; viral level was 24 and needs to be at 19 to be undetectable.  Follow-up in three months; then can spread out to six months.  Mom helps patient remember medication; good support system.  Aunt thinks pt should get out more.  Pamala Hurry from the health department came and made a list of partners.    Hyperglycemia:  Repeat glucose readings of 115.  ID physician plans to check HgbA1c at follow-up visit.  Marijuana use: wants to discuss. Discussed with ID physician.  Wants to make sure OK to continue.  Vulvar area: having a lot of itching.  S/p treatment for genital warts and abnormal pap smear with vulvar dysplasia by Toney Rakes.  Onset of itching two weeks ago.  Prescribed Aldara by Toney Rakes after acid treatment.  Warts return after Aldara.  Does not look down in vaginal area anymore.  One specific area that itches.  Not using Aldara currently.  No sexual activity since March 2015.    Stress reaction: lost contact with therapist.  Emotion currently is anger.  Lashing out anger.  Mom feels like anger has been getting out of hand; i.e. At work.  Not snappy with mother.  Moody and mood swings.    Loves work but people drives pt crazy.  Job is stressful.  All women with a couple of men.  Just like high school; lots of drama.  No SI/HI.  Insomnia: still suffering with insomnia.  Tried Melatonin; recommended sleep therapy at last visit.  Tried going to sleep at same time every night.  As a Freight forwarder, has a  changing schedule.  This week, will be working third shift for two days.  Sleep is off.  Falling asleep is hard; usually falls asleep at 5:00am in morning.  Sleeping four hours per day on average.    Bumps in axillary area:  Thought was deodorant.  Went back to degree.  Tried to use baby powder.  Discussed with dermatologist; recommended cream on face and axilla.  Chronic issue.  Headaches: B band like headaches; does a lot of headaches.  Tightness, pounding sensation.  +photophobia.  No n/v.  No dizziness.  Usually occurs later in day; really frequent at work.     Review of Systems  Constitutional: Negative for fever, chills, diaphoresis and fatigue.  Eyes: Positive for photophobia. Negative for visual disturbance.  Respiratory: Negative for cough and shortness of breath.   Cardiovascular: Negative for chest pain, palpitations and leg swelling.  Gastrointestinal: Negative for nausea, vomiting, abdominal pain, diarrhea and constipation.  Endocrine: Negative for cold intolerance, heat intolerance, polydipsia, polyphagia and polyuria.  Genitourinary: Positive for genital sores. Negative for vaginal bleeding, vaginal discharge and vaginal pain.  Skin: Positive for rash.  Neurological: Positive for headaches. Negative for dizziness, tremors, seizures, syncope, facial asymmetry, speech difficulty, weakness, light-headedness and numbness.  Psychiatric/Behavioral: Positive for sleep disturbance. Negative for suicidal ideas and self-injury. The patient is nervous/anxious.     Past  Medical History  Diagnosis Date  . Allergy     Allegra  . Asthma     mild; onset in childhood.  No hospitalizations.  . Eczema   . Acne   . Hidradenitis   . CIN I (cervical intraepithelial neoplasia I) 08/31/2013    colposcopy by Toney Rakes; s/p CO2 laser treatment.  Marland Kitchen VIN I (vulvar intraepithelial neoplasia I) 08/31/2013    s/p CO2 treatment; Fernandez/gyn.  . Genital warts 08/31/2013    Aldara treatment; Toney Rakes.  .  Chlamydia 03/03/2012  . HIV (human immunodeficiency virus infection)   . Substance abuse    Past Surgical History  Procedure Laterality Date  . Breast surgery  02/01/2008    Reduction  . Co2 laser application N/A 0/96/2836    Procedure: C02 laser of vagina and cervix;  Surgeon: Terrance Mass, MD;  Location: [facility];  Service: Gynecology;  Laterality: N/A;   Allergies  Allergen Reactions  . Other Nausea And Vomiting and Swelling    Tree Nuts (Almonds)  . Peanut-Containing Drug Products         Objective:    BP 120/80 mmHg  Pulse 98  Temp(Src) 99.1 F (37.3 C) (Oral)  Resp 16  Ht 5' 3.75" (1.619 m)  Wt 240 lb (108.863 kg)  BMI 41.53 kg/m2  SpO2 97% Physical Exam  Constitutional: She is oriented to person, place, and time. She appears well-developed and well-nourished. No distress.  Obese  HENT:  Head: Normocephalic and atraumatic.  Right Ear: External ear normal.  Left Ear: External ear normal.  Nose: Nose normal.  Mouth/Throat: Oropharynx is clear and moist.  Eyes: Conjunctivae and EOM are normal. Pupils are equal, round, and reactive to light.  Neck: Normal range of motion. Neck supple. Carotid bruit is not present. No thyromegaly present.  Cardiovascular: Normal rate, regular rhythm, normal heart sounds and intact distal pulses.  Exam reveals no gallop and no friction rub.   No murmur heard. Pulmonary/Chest: Effort normal and breath sounds normal. She has no wheezes. She has no rales.  Abdominal: Soft. Bowel sounds are normal. She exhibits no distension and no mass. There is no tenderness. There is no rebound and no guarding.  Genitourinary: Vagina normal and uterus normal.    Cervix exhibits no motion tenderness, no discharge and no friability. Right adnexum displays no mass and no tenderness. Left adnexum displays no mass and no tenderness.  Vulvar changes inferior aspect.  Lymphadenopathy:    She has no cervical adenopathy.  Neurological: She is alert and  oriented to person, place, and time. No cranial nerve deficit.  Skin: Skin is warm and dry. Rash noted. She is not diaphoretic. No erythema. No pallor.  Multiple cystic pustular lesions B axillary region.  Psychiatric: She has a normal mood and affect. Her behavior is normal.   Results for orders placed or performed in visit on 02/12/14  HIV-1 RNA ultraquant reflex to gentyp+  Result Value Ref Range   HIV 1 RNA Quant 24 (H) <20 copies/mL   HIV1 RNA Quant, Log 1.38 (H) <1.30 log 10  CBC with Differential  Result Value Ref Range   WBC 7.6 4.0 - 10.5 K/uL   RBC 4.44 3.87 - 5.11 MIL/uL   Hemoglobin 12.2 12.0 - 15.0 g/dL   HCT 35.7 (L) 36.0 - 46.0 %   MCV 80.4 78.0 - 100.0 fL   MCH 27.5 26.0 - 34.0 pg   MCHC 34.2 30.0 - 36.0 g/dL   RDW 16.0 (H) 11.5 -  15.5 %   Platelets 366 150 - 400 K/uL   MPV 9.1 8.6 - 12.4 fL   Neutrophils Relative % 63 43 - 77 %   Neutro Abs 4.8 1.7 - 7.7 K/uL   Lymphocytes Relative 25 12 - 46 %   Lymphs Abs 1.9 0.7 - 4.0 K/uL   Monocytes Relative 9 3 - 12 %   Monocytes Absolute 0.7 0.1 - 1.0 K/uL   Eosinophils Relative 2 0 - 5 %   Eosinophils Absolute 0.2 0.0 - 0.7 K/uL   Basophils Relative 1 0 - 1 %   Basophils Absolute 0.1 0.0 - 0.1 K/uL   Smear Review Criteria for review not met   COMPLETE METABOLIC PANEL WITH GFR  Result Value Ref Range   Sodium 137 135 - 145 mEq/L   Potassium 4.2 3.5 - 5.3 mEq/L   Chloride 104 96 - 112 mEq/L   CO2 26 19 - 32 mEq/L   Glucose, Bld 115 (H) 70 - 99 mg/dL   BUN 8 6 - 23 mg/dL   Creat 0.72 0.50 - 1.10 mg/dL   Total Bilirubin 0.3 0.2 - 1.2 mg/dL   Alkaline Phosphatase 35 (L) 39 - 117 U/L   AST 15 0 - 37 U/L   ALT 14 0 - 35 U/L   Total Protein 7.4 6.0 - 8.3 g/dL   Albumin 3.6 3.5 - 5.2 g/dL   Calcium 9.0 8.4 - 10.5 mg/dL   GFR, Est African American >89 mL/min   GFR, Est Non African American >89 mL/min  HIV-1 Integrase Genotype  Result Value Ref Range   This LRR created for HIV-1 Integrase Genotype from [employer] NG     Date Viral Load Collected NG    Raltegravir Resistance NOT PERFORMED    Elvitegravir Resistance NOT PERFORMED    Dolutegravir Resistance NOT PERFORMED   HLA B*5701  Result Value Ref Range   HLA-B*5701 w/rflx HLA-B High Negative   T-helper cell (CD4)- ([facility] clinic only)  Result Value Ref Range   CD4 T Cell Abs 480 400 - 2700 /uL   CD4 % Helper T Cell 26 (L) 33 - 55 %       Assessment & Plan:   1. Insomnia   2. Stress reaction   3. Vulvar irritation   4. VIN I (vulvar intraepithelial neoplasia I)   5. Obesity   6. HYDRADENITIS   7. HIV disease   8. Hyperglycemia     1. Insomnia: uncontrolled; no improvement with Melatonin and attempts at improved sleep hygiene.  Rx for Trazodone provided. 2.  Stress reaction: worsening; having anger at this time; refer back to psychology for ongoing counseling with recent HIV dx.  Recommend regular exercise for stress management. 3.  Vulvar irritation: Recurrent; recommend evaluation by GYN. 4. VINI: s/p treatment with new area of irritation concerning for recurrence.  Follow-up with GYN. 5.  Obesity: recommend weight loss, exercise, dietary modification. 6. Hydradenitis: Persistent; recommend restarting Doxycycline. 7. HIV: New diagnosis; s/p three visits with ID; tolerating medication. 8. Hyperglycemia: New. To undergo Hemoglobin A1c with ID in three months; pt declined labs today.  Recommend weight loss, exercise, low-sugar and low-carbohydrate food choices.   Meds ordered this encounter  Medications  . DISCONTD: traZODone (DESYREL) 50 MG tablet    Sig: Take 1-2 tablets (50-100 mg total) by mouth at bedtime as needed for sleep.    Dispense:  60 tablet    Refill:  3  . traZODone (DESYREL) 50 MG tablet  Sig: Take 1-2 tablets (50-100 mg total) by mouth at bedtime as needed for sleep.    Dispense:  60 tablet    Refill:  3    Return in about 2 months (around 06/16/2014) for complete physical examiniation.     Kristi Elayne Guerin,  M.D. [facility] [address] [place], [place]  [address] [phone] phone [phone] fax

## 2014-04-18 ENCOUNTER — Encounter: Payer: Self-pay | Admitting: Gynecology

## 2014-04-18 ENCOUNTER — Ambulatory Visit (INDEPENDENT_AMBULATORY_CARE_PROVIDER_SITE_OTHER): Payer: 59 | Admitting: Gynecology

## 2014-04-18 VITALS — BP 128/84

## 2014-04-18 DIAGNOSIS — B20 Human immunodeficiency virus [HIV] disease: Secondary | ICD-10-CM | POA: Diagnosis not present

## 2014-04-18 DIAGNOSIS — A630 Anogenital (venereal) warts: Secondary | ICD-10-CM

## 2014-04-18 NOTE — Patient Instructions (Signed)
Genital Warts Genital warts are a sexually transmitted infection. They may appear as small bumps on the tissues of the genital area. CAUSES  Genital warts are caused by a virus called human papillomavirus (HPV). HPV is the most common sexually transmitted disease (STD) and infection of the sex organs. This infection is spread by having unprotected sex with an infected person. It can be spread by vaginal, anal, and oral sex. Many people do not know they are infected. They may be infected for years without problems. However, even if they do not have problems, they can unknowingly pass the infection to their sexual partners. SYMPTOMS   Itching and irritation in the genital area.  Warts that bleed.  Painful sexual intercourse. DIAGNOSIS  Warts are usually recognized with the naked eye on the vagina, vulva, perineum, anus, and rectum. Certain tests can also diagnose genital warts, such as:  A Pap test.  A tissue sample (biopsy) exam.  Colposcopy. A magnifying tool is used to examine the vagina and cervix. The HPV cells will change color when certain solutions are used. TREATMENT  Warts can be removed by:  Applying certain chemicals, such as cantharidin or podophyllin.  Liquid nitrogen freezing (cryotherapy).  Immunotherapy with Candida or Trichophyton injections.  Laser treatment.  Burning with an electrified probe (electrocautery).  Interferon injections.  Surgery. PREVENTION  HPV vaccination can help prevent HPV infections that cause genital warts and that cause cancer of the cervix. It is recommended that the vaccination be given to people between the ages 9 to 26 years old. The vaccine might not work as well or might not work at all if you already have HPV. It should not be given to pregnant women. HOME CARE INSTRUCTIONS   It is important to follow your caregiver's instructions. The warts will not go away without treatment. Repeat treatments are often needed to get rid of warts.  Even after it appears that the warts are gone, the normal tissue underneath often remains infected.  Do not try to treat genital warts with medicine used to treat hand warts. This type of medicine is strong and can burn the skin in the genital area, causing more damage.  Tell your past and current sexual partner(s) that you have genital warts. They may be infected also and need treatment.  Avoid sexual contact while being treated.  Do not touch or scratch the warts. The infection may spread to other parts of your body.  Women with genital warts should have a cervical cancer check (Pap test) at least once a year. This type of cancer is slow-growing and can be cured if found early. Chances of developing cervical cancer are increased with HPV.  Inform your obstetrician about your warts in the event of pregnancy. This virus can be passed to the baby's respiratory tract. Discuss this with your caregiver.  Use a condom during sexual intercourse. Following treatment, the use of condoms will help prevent reinfection.  Ask your caregiver about using over-the-counter anti-itch creams. SEEK MEDICAL CARE IF:   Your treated skin becomes red, swollen, or painful.  You have a fever.  You feel generally ill.  You feel little lumps in and around your genital area.  You are bleeding or have painful sexual intercourse. MAKE SURE YOU:   Understand these instructions.  Will watch your condition.  Will get help right away if you are not doing well or get worse. Document Released: 01/15/2000 Document Revised: 06/03/2013 Document Reviewed: 07/26/2010 ExitCare Patient Information 2015 [employer]. This   information is not intended to replace advice given to you by your health care provider. Make sure you discuss any questions you have with your health care provider.  

## 2014-04-18 NOTE — Progress Notes (Signed)
   Stating that she had recurrence of her vulvar condyloma. Her history is as follows.Patient was seen the office on 09/19/2013 for six-week postop visit. Patient was status post CO2 laser ablation of vulvar and cervical dysplasia and vulvar condyloma acuminatum as follows:  The CIN-1 cervix 12:00 position, VIn-1 and condylomata fourchette and perineum  It was noted during that visit that the area of the perineum whereby the CO2 laser was performed has healed completely but in the inner aspect of the fourchette there were 2 condylomatous areas still present. On speculum exam there were no cervical or vaginal lesions endocervical area had healed from the previous CO2 laser.  She was treated at that visit on August 20 with 50% TCA and was prescribed Aldara cream to apply 3 times a week for 3 months in follow-up visit reason she is here for today.  On November 17 there was a small area still present at the fourchette and she was treated with 50% TCA returns today for follow-up. On examination the area is much smaller so another treatment of 50% TCA was applied. She recently completed 3 months treatment of Aldara and completely 1 away and had a recurrence. She is currently being treated for her HIV.  Physical Exam  Genitourinary:       A thorough inspection of the external genitalia and vagina demonstrated the above mentioned condylomatous crows in the superior aspect of the right labia majora near the fourchette. These areas were treated with 80% TCA. Patient will return back to the office in one week for follow-up treatment and then placed on additional 3 months treatment of Aldara. I have explained to her that patient's who are immunocompromise may have exacerbations and times a remission and she may need future treatments as well. She is scheduled to see her primary next month who she states will be doing her Pap smear.

## 2014-04-24 ENCOUNTER — Ambulatory Visit: Payer: 59 | Admitting: Gynecology

## 2014-04-29 ENCOUNTER — Ambulatory Visit (INDEPENDENT_AMBULATORY_CARE_PROVIDER_SITE_OTHER): Payer: 59 | Admitting: Gynecology

## 2014-04-29 ENCOUNTER — Encounter: Payer: Self-pay | Admitting: Gynecology

## 2014-04-29 VITALS — BP 130/82

## 2014-04-29 DIAGNOSIS — A630 Anogenital (venereal) warts: Secondary | ICD-10-CM

## 2014-04-29 MED ORDER — IMIQUIMOD 5 % EX CREA: TOPICAL_CREAM | CUTANEOUS | Status: DC

## 2014-04-29 NOTE — Progress Notes (Signed)
  Patient presented to the office today for follow-up on her vulvar condyloma.Her history is as follows.Patient was seen the office on 09/19/2013 for six-week postop visit. Patient was status post CO2 laser ablation of vulvar and cervical dysplasia and vulvar condyloma acuminatum as follows:  The CIN-1 cervix 12:00 position, VIn-1 and condylomata fourchette and perineum  It was noted during that visit that the area of the perineum whereby the CO2 laser was performed has healed completely but in the inner aspect of the fourchette there were 2 condylomatous areas still present. On speculum exam there were no cervical or vaginal lesions endocervical area had healed from the previous CO2 laser.  She was treated at that visit on August 20 with 50% TCA and was prescribed Aldara cream to apply 3 times a week for 3 months in follow-up visit reason she is here for today.  On November 17 there was a small area still present at the fourchette and she was treated with 50% TCA returns today for follow-up. On examination the area is much smaller so another treatment of 50% TCA was applied. She recently completed 3 months treatment of Aldara and completely went away. On last office visit of March 18 it was noted the superior aspect of the right labia majora and near the fourchette there was evidence of condyloma acuminatum and was treated with TCA 80%. She was asked to return today for follow-up. She stated she noticed improvement but there was an area between the vagina and her rectum.  Physical Exam  Genitourinary:       The new small area in the perineum with new condylomatous growth was treated with 80% TCA. The other 2 areas that were previously treated were retreated once again but with 50% TCA. She is going to be given a prescription for Aldara cream to apply 3 times a week for the next 3 months. She is due for her annual exam in June and we'll take a look again. Instructions on the usage of Aldara was  provided.

## 2014-04-29 NOTE — Patient Instructions (Signed)
Imiquimod skin cream What is this medicine? IMIQUIMOD (i mi KWI mod) cream is used to treat external genital or anal warts. It is also used to treat other skin conditions such as actinic keratosis and certain types of skin cancer. This medicine may be used for other purposes; ask your health care provider or pharmacist if you have questions. COMMON BRAND NAME(S): Tawni Levy What should I tell my health care provider before I take this medicine? They need to know if you have any of these conditions: -decreased immune function -an unusual or allergic reaction to imiquimod, other medicines, foods, dyes, or preservatives -pregnant or trying to get pregnant -breast-feeding How should I use this medicine? This medicine is for external use only. Do not take by mouth. Follow the directions on the prescription label. Apply just before bedtime. Wash your hands before and after use. Apply a thin layer of cream and massage gently into the affected areas until no longer visible. Do not use in the mouth, eyes or the vagina. Use this medicine only on the affected area as directed by your health care provider. Do not use for longer than prescribed. It is important not to use more medicine than prescribed. To do so may increase the chance of side effects. Talk to your pediatrician regarding the use of this medicine in children. While this drug may be prescribed for children as young as 92 years of age for selected conditions, precautions do apply. Overdosage: If you think you have taken too much of this medicine contact a poison control center or emergency room at once. NOTE: This medicine is only for you. Do not share this medicine with others. What if I miss a dose? If you miss a dose, use it as soon as you can. If it is almost time for your next dose, use only that dose. Do not use double or extra doses. What may interact with this medicine? Interactions are not expected. Do not use any other medicines on  the treated area without asking your doctor or health care professional. This list may not describe all possible interactions. Give your health care provider a list of all the medicines, herbs, non-prescription drugs, or dietary supplements you use. Also tell them if you smoke, drink alcohol, or use illegal drugs. Some items may interact with your medicine. What should I watch for while using this medicine? Visit your health care professional for regular checks on your progress. Do not use this medicine until the skin has healed from any other drug (example: podofilox or podophyllin resin) or surgical skin treatment. Females should receive regular pelvic exams while being treated for genital warts. Most patients see improvement within 4 weeks. It may take up to 16 weeks to see a full clearing of the warts. This medicine is not a cure. New warts may develop during or after treatment. Avoid sexual (genital, anal, oral) contact while the cream is on the skin. If warts are visible in the genital area, sexual contact should be avoided until the warts are treated. The use of latex condoms during sexual contact may reduce, but not entirely prevent, infecting others. This medicine may weaken condoms, diaphragms, cervical caps or other barrier devices and make them less effective as birth control. Do not cover the treated area with an airtight bandage. Cotton gauze dressings can be used. Cotton underwear can be worn after using this medicine on the genital or anal area. Actinic keratoses that were not seen before may appear during treatment and  may later go away. The treatment area and surrounding area may lighten or darken after treatment with this medicine. These skin color changes may be permanent in some patients. If you experience a skin reaction at the treatment site that interferes or prevents you from doing any daily activity, contact your health care provider. You may need a rest period from treatment.  Treatment may be restarted once the reaction has gotten better as recommended by your doctor or health care professional. This medicine can make you more sensitive to the sun. Keep out of the sun. If you cannot avoid being in the sun, wear protective clothing and use sunscreen. Do not use sun lamps or tanning beds/booths. What side effects may I notice from receiving this medicine? Side effects that you should report to your doctor or health care professional as soon as possible: -open sores with or without drainage -skin infection -skin rash -unusual or severe skin reaction Side effects that usually do not require medical attention (report to your doctor or health care professional if they continue or are bothersome): -burning or itching -redness of the skin (very common but is usually not painful or harmful) -scabbing, crusting, or peeling skin -skin that becomes hard or thickened -swelling of the skin This list may not describe all possible side effects. Call your doctor for medical advice about side effects. You may report side effects to FDA at 1-800-FDA-1088. Where should I keep my medicine? Keep out of the reach of children. Store between 4 and 25 degrees C (39 and 77 degrees F). Do not freeze. Throw away any unused medicine after the expiration date. Discard packet after applying to affected area. Partial packets should not be saved or reused. NOTE: This sheet is a summary. It may not cover all possible information. If you have questions about this medicine, talk to your doctor, pharmacist, or health care provider.  2015, [employer]vier/Gold Standard. (2008-01-01 10:33:25)

## 2014-05-01 ENCOUNTER — Ambulatory Visit (INDEPENDENT_AMBULATORY_CARE_PROVIDER_SITE_OTHER): Payer: 59 | Admitting: Emergency Medicine

## 2014-05-01 VITALS — BP 110/80 | HR 96 | Temp 98.1°F | Resp 18 | Ht 64.5 in | Wt 241.6 lb

## 2014-05-01 DIAGNOSIS — H00013 Hordeolum externum right eye, unspecified eyelid: Secondary | ICD-10-CM | POA: Diagnosis not present

## 2014-05-01 MED ORDER — POLYMYXIN B-TRIMETHOPRIM 10000-0.1 UNIT/ML-% OP SOLN: 2.0000 [drp] | OPHTHALMIC | Status: DC

## 2014-05-01 NOTE — Progress Notes (Signed)
[facility] [address] [place] [address] [phone]  Date:  05/01/2014   Name:  Kelly Hunter   DOB:  1990-07-15   MRN:  [id number]  PCP:  Reginia Forts, MD    Chief Complaint: eye swollen   History of Present Illness:  Kelly Hunter is a 24 y.o. very pleasant female patient who presents with the following:  Patient is a daily contact lens wearer. Has pain in right eye for past two days Some photophobia No FB sensation No visual symptoms Injected No drainage Lower lid swollen No improvement with over the counter medications or other home remedies.  Denies other complaint or health concern today.   Patient Active Problem List   Diagnosis Date Noted  . HIV disease 01/15/2014  . Asymptomatic HIV infection 12/30/2013  . Eczema 10/22/2013  . Rhinitis, allergic 10/22/2013  . Condyloma acuminatum of vulva 06/18/2013  . VIN I (vulvar intraepithelial neoplasia I) 06/18/2013  . Dysplasia of cervix, low grade (CIN 1) 06/18/2013  . ASCUS with positive high risk HPV 06/04/2013  . Obesity 03/30/2006  . ASTHMA, EXERCISE INDUCED 03/30/2006  . HYDRADENITIS 03/30/2006  . ACNE 03/30/2006    Past Medical History  Diagnosis Date  . Allergy     Allegra  . Asthma     mild; onset in childhood.  No hospitalizations.  . Eczema   . Acne   . Hidradenitis   . CIN I (cervical intraepithelial neoplasia I) 08/31/2013    colposcopy by Toney Rakes; s/p CO2 laser treatment.  Marland Kitchen VIN I (vulvar intraepithelial neoplasia I) 08/31/2013    s/p CO2 treatment; Fernandez/gyn.  . Genital warts 08/31/2013    Aldara treatment; Toney Rakes.  . Chlamydia 03/03/2012  . HIV (human immunodeficiency virus infection)   . Substance abuse     Past Surgical History  Procedure Laterality Date  . Breast surgery  02/01/2008    Reduction  . Co2 laser application N/A 9/51/8841    Procedure: C02 laser of vagina and cervix;  Surgeon: Terrance Mass, MD;  Location: [facility];  Service:  Gynecology;  Laterality: N/A;    History  Substance Use Topics  . Smoking status: Never Smoker   . Smokeless tobacco: Never Used  . Alcohol Use: 0.0 oz/week    0 Standard drinks or equivalent per week     Comment: previously used socially    Family History  Problem Relation Age of Onset  . Diabetes Mother   . Hypertension Mother   . Hyperlipidemia Mother   . Mental illness Mother   . Cancer Maternal Grandfather     ?    Allergies  Allergen Reactions  . Other Nausea And Vomiting and Swelling    Tree Nuts (Almonds)  . Peanut-Containing Drug Products     Medication list has been reviewed and updated.  Current Outpatient Prescriptions on File Prior to Visit  Medication Sig Dispense Refill  . Abacavir-Dolutegravir-Lamivud (TRIUMEQ) 600-50-300 MG TABS Take 1 tablet by mouth daily. 30 tablet 11  . desogestrel-ethinyl estradiol (APRI,EMOQUETTE,SOLIA) 0.15-30 MG-MCG tablet Take 1 tablet by mouth daily. 2 Package 7  . doxycycline (DORYX) 100 MG EC tablet Take 100 mg by mouth 2 (two) times daily.    . imiquimod (ALDARA) 5 % cream Apply topically 3 (three) times a week. 12 each 0  . traZODone (DESYREL) 50 MG tablet Take 1-2 tablets (50-100 mg total) by mouth at bedtime as needed for sleep. 60 tablet 3  . Triamcinolone Acetonide (TRIAMCINOLONE  0.1 % CREAM : EUCERIN) CREA Use as directed 480 each 11   No current facility-administered medications on file prior to visit.    Review of Systems:  As per HPI, otherwise negative.    Physical Examination: Filed Vitals:   05/01/14 0954  BP: 110/80  Pulse: 96  Temp: 98.1 F (36.7 C)  Resp: 18   Filed Vitals:   05/01/14 0954  Height: 5' 4.5" (1.638 m)  Weight: 241 lb 9.6 oz (109.589 kg)   Body mass index is 40.85 kg/(m^2). Ideal Body Weight: Weight in (lb) to have BMI = 25: 147.6   GEN: WDWN, NAD, Non-toxic, Alert & Oriented x 3 HEENT: Atraumatic, Normocephalic.  Ears and Nose: No external deformity. EXTR: No  clubbing/cyanosis/edema NEURO: Normal gait.  PSYCH: Normally interactive. Conversant. Not depressed or anxious appearing.  Calm demeanor.  RIGHT eye:  Lower lid red and swollen.  No FB no injection or drainage.  Assessment and Plan: Hordeolum trimeth drops Eye Doc on Monday  Signed,  Ellison Carwin, MD

## 2014-05-01 NOTE — Patient Instructions (Signed)

## 2014-05-02 ENCOUNTER — Telehealth: Payer: Self-pay

## 2014-05-02 NOTE — Telephone Encounter (Signed)
Pt called and stated that her eye feels more swollen and worse than at OV yesterday. She has been using the eye drops as instr'd. She also reported that she has used warm compresses, and it helps temporarily but then worsens again. I asked pt if the eye was worse when she got up in the morning, and she stated that she has just recently gotten up and it was worse with wakening, and reported that it may be starting to appear less swollen now. Advised pt to use warm compress again and to see how it feels after she has been up a little longer. Advised if still worse than yesterday she should RTC or call her eye doctor and see if they can see her today instead of Monday. Pt agreed.

## 2014-05-21 ENCOUNTER — Encounter: Payer: Self-pay | Admitting: Infectious Disease

## 2014-05-21 ENCOUNTER — Ambulatory Visit (INDEPENDENT_AMBULATORY_CARE_PROVIDER_SITE_OTHER): Payer: 59 | Admitting: Infectious Disease

## 2014-05-21 VITALS — BP 102/73 | HR 100 | Temp 97.6°F | Wt 243.0 lb

## 2014-05-21 DIAGNOSIS — R896 Abnormal cytological findings in specimens from other organs, systems and tissues: Secondary | ICD-10-CM

## 2014-05-21 DIAGNOSIS — B20 Human immunodeficiency virus [HIV] disease: Secondary | ICD-10-CM

## 2014-05-21 DIAGNOSIS — Z23 Encounter for immunization: Secondary | ICD-10-CM | POA: Diagnosis not present

## 2014-05-21 DIAGNOSIS — IMO0002 Reserved for concepts with insufficient information to code with codable children: Secondary | ICD-10-CM

## 2014-05-21 DIAGNOSIS — L732 Hidradenitis suppurativa: Secondary | ICD-10-CM | POA: Diagnosis not present

## 2014-05-21 DIAGNOSIS — E669 Obesity, unspecified: Secondary | ICD-10-CM

## 2014-05-21 DIAGNOSIS — Z79899 Other long term (current) drug therapy: Secondary | ICD-10-CM

## 2014-05-21 DIAGNOSIS — Z113 Encounter for screening for infections with a predominantly sexual mode of transmission: Secondary | ICD-10-CM

## 2014-05-21 HISTORY — DX: Hidradenitis suppurativa: L73.2

## 2014-05-21 MED ORDER — ABACAVIR-DOLUTEGRAVIR-LAMIVUD 600-50-300 MG PO TABS: 1.0000 | ORAL_TABLET | Freq: Every day | ORAL | Status: DC

## 2014-05-21 NOTE — Progress Notes (Signed)
   Subjective:    Patient ID: Kelly Hunter, female    DOB: 03/03/1990, 24 y.o.   MRN: [id number]  HPI   23 year old African-American lady recently diagnosed with HIV infection. Of note she tested negative for HIV in 2010. In February of  2014 she had an acute illness of 4-6 weeks and was tested for rheumatic fever viral hepatitis panel.  I suspect that point in time that she actually was succumbing to acute HIV infection. She has now been found to have HIV on testing in November with her primary care physician. We saw her in December and started TIvicay and Truvada. She likes this regimen very much and has near undetectable viral load and healthy CD4 count. WE then changed to [facility]. She is still taking this religiously  She is suffering from what sounds like hidradenitis and taking doxy chronically for this.    Lab Results  Component Value Date   HIV1RNAQUANT 24* 02/12/2014     Her CD4 is  Lab Results  Component Value Date   CD4TABS 480 02/12/2014   CD4TABS 420 12/31/2013   Will change to Kim today. No Hep B coinfection.   Review of Systems  Constitutional: Negative for fever, chills, diaphoresis, activity change, appetite change, fatigue and unexpected weight change.  HENT: Negative for congestion, rhinorrhea, sinus pressure, sneezing, sore throat and trouble swallowing.   Eyes: Negative for photophobia and visual disturbance.  Respiratory: Negative for cough, chest tightness, shortness of breath, wheezing and stridor.   Cardiovascular: Negative for chest pain, palpitations and leg swelling.  Gastrointestinal: Negative for nausea, vomiting, abdominal pain, diarrhea, constipation, blood in stool, abdominal distention and anal bleeding.  Genitourinary: Negative for dysuria, hematuria, flank pain and difficulty urinating.  Musculoskeletal: Negative for myalgias, back pain, joint swelling, arthralgias and gait problem.  Skin: Negative for color change, pallor, rash and  wound.  Neurological: Negative for dizziness, tremors, weakness and light-headedness.  Hematological: Negative for adenopathy. Does not bruise/bleed easily.  Psychiatric/Behavioral: Negative for behavioral problems, confusion, sleep disturbance, dysphoric mood, decreased concentration and agitation.       Objective:   Physical Exam  Constitutional: She is oriented to person, place, and time. She appears well-developed and well-nourished. No distress.  HENT:  Head: Normocephalic and atraumatic.  Mouth/Throat: No oropharyngeal exudate.  Eyes: Conjunctivae and EOM are normal. No scleral icterus.  Neck: Normal range of motion. Neck supple.  Cardiovascular: Normal rate and regular rhythm.   Pulmonary/Chest: Effort normal. No respiratory distress. She has no wheezes.  Abdominal: She exhibits no distension.  Musculoskeletal: She exhibits no edema or tenderness.  Neurological: She is alert and oriented to person, place, and time. She exhibits normal muscle tone. Coordination normal.  Skin: Skin is warm and dry. No rash noted. She is not diaphoretic. No erythema. No pallor.  Psychiatric: She has a normal mood and affect. Her behavior is normal. Judgment and thought content normal.          Assessment & Plan:  HIV: continue TRIUMEQ and recheck in 6 months.  Hx of ASCUS: will need regular pap smears and close followup. Also had HPV + of vulva  Hidradenitis: I would NOT recommend chronic doxy if she has this but rather judicious use of abx

## 2014-05-22 LAB — URINE CYTOLOGY ANCILLARY ONLY
CHLAMYDIA, DNA PROBE: NEGATIVE
Neisseria Gonorrhea: NEGATIVE

## 2014-05-23 LAB — RPR

## 2014-05-23 LAB — CBC WITH DIFFERENTIAL/PLATELET

## 2014-05-23 LAB — COMPLETE METABOLIC PANEL WITH GFR

## 2014-05-23 LAB — T-HELPER CELL (CD4) - (RCID CLINIC ONLY)
CD4 T CELL HELPER: 29 % — AB (ref 33–55)
CD4 T Cell Abs: 510 /uL (ref 400–2700)

## 2014-05-23 LAB — HIV-1 RNA QUANT-NO REFLEX-BLD

## 2014-05-27 ENCOUNTER — Other Ambulatory Visit: Payer: 59

## 2014-05-27 DIAGNOSIS — Z113 Encounter for screening for infections with a predominantly sexual mode of transmission: Secondary | ICD-10-CM

## 2014-05-27 DIAGNOSIS — B20 Human immunodeficiency virus [HIV] disease: Secondary | ICD-10-CM

## 2014-05-27 LAB — CBC WITH DIFFERENTIAL/PLATELET
BASOS ABS: 0 10*3/uL (ref 0.0–0.1)
Basophils Relative: 0 % (ref 0–1)
EOS ABS: 0.1 10*3/uL (ref 0.0–0.7)
EOS PCT: 2 % (ref 0–5)
HCT: 34.7 % — ABNORMAL LOW (ref 36.0–46.0)
HEMOGLOBIN: 12 g/dL (ref 12.0–15.0)
Lymphocytes Relative: 23 % (ref 12–46)
Lymphs Abs: 1.6 10*3/uL (ref 0.7–4.0)
MCH: 28.3 pg (ref 26.0–34.0)
MCHC: 34.6 g/dL (ref 30.0–36.0)
MCV: 81.8 fL (ref 78.0–100.0)
MONO ABS: 0.6 10*3/uL (ref 0.1–1.0)
MPV: 9.1 fL (ref 8.6–12.4)
Monocytes Relative: 8 % (ref 3–12)
Neutro Abs: 4.8 10*3/uL (ref 1.7–7.7)
Neutrophils Relative %: 67 % (ref 43–77)
PLATELETS: 363 10*3/uL (ref 150–400)
RBC: 4.24 MIL/uL (ref 3.87–5.11)
RDW: 15.8 % — ABNORMAL HIGH (ref 11.5–15.5)
WBC: 7.1 10*3/uL (ref 4.0–10.5)

## 2014-05-27 LAB — COMPLETE METABOLIC PANEL WITH GFR
ALK PHOS: 37 U/L — AB (ref 39–117)
ALT: 14 U/L (ref 0–35)
AST: 14 U/L (ref 0–37)
Albumin: 3.8 g/dL (ref 3.5–5.2)
BILIRUBIN TOTAL: 0.4 mg/dL (ref 0.2–1.2)
BUN: 7 mg/dL (ref 6–23)
CHLORIDE: 106 meq/L (ref 96–112)
CO2: 23 meq/L (ref 19–32)
Calcium: 8.9 mg/dL (ref 8.4–10.5)
Creat: 0.53 mg/dL (ref 0.50–1.10)
Glucose, Bld: 109 mg/dL — ABNORMAL HIGH (ref 70–99)
POTASSIUM: 4.5 meq/L (ref 3.5–5.3)
Sodium: 139 mEq/L (ref 135–145)
Total Protein: 7.7 g/dL (ref 6.0–8.3)

## 2014-05-28 LAB — RPR

## 2014-05-29 LAB — HIV-1 RNA QUANT-NO REFLEX-BLD
HIV 1 RNA Quant: <20 {copies}/mL
HIV-1 RNA Quant, Log: <1.3 {Log}

## 2014-06-18 ENCOUNTER — Ambulatory Visit (INDEPENDENT_AMBULATORY_CARE_PROVIDER_SITE_OTHER): Payer: 59 | Admitting: Family Medicine

## 2014-06-18 ENCOUNTER — Encounter: Payer: Self-pay | Admitting: Family Medicine

## 2014-06-18 VITALS — BP 106/66 | HR 81 | Temp 98.9°F | Resp 16 | Ht 63.75 in | Wt 240.0 lb

## 2014-06-18 DIAGNOSIS — B20 Human immunodeficiency virus [HIV] disease: Secondary | ICD-10-CM

## 2014-06-18 DIAGNOSIS — Z01419 Encounter for gynecological examination (general) (routine) without abnormal findings: Secondary | ICD-10-CM | POA: Diagnosis not present

## 2014-06-18 DIAGNOSIS — B977 Papillomavirus as the cause of diseases classified elsewhere: Secondary | ICD-10-CM | POA: Diagnosis not present

## 2014-06-18 DIAGNOSIS — R896 Abnormal cytological findings in specimens from other organs, systems and tissues: Secondary | ICD-10-CM | POA: Diagnosis not present

## 2014-06-18 DIAGNOSIS — G4700 Insomnia, unspecified: Secondary | ICD-10-CM | POA: Diagnosis not present

## 2014-06-18 DIAGNOSIS — N870 Mild cervical dysplasia: Secondary | ICD-10-CM | POA: Diagnosis not present

## 2014-06-18 DIAGNOSIS — N900 Mild vulvar dysplasia: Secondary | ICD-10-CM

## 2014-06-18 DIAGNOSIS — Z0000 Encounter for general adult medical examination without abnormal findings: Secondary | ICD-10-CM

## 2014-06-18 DIAGNOSIS — IMO0002 Reserved for concepts with insufficient information to code with codable children: Secondary | ICD-10-CM

## 2014-06-18 LAB — POCT URINALYSIS DIPSTICK
BILIRUBIN UA: NEGATIVE
Glucose, UA: NEGATIVE
KETONES UA: NEGATIVE
Leukocytes, UA: NEGATIVE
Nitrite, UA: NEGATIVE
PH UA: 6
SPEC GRAV UA: 1.02
Urobilinogen, UA: 0.2

## 2014-06-18 MED ORDER — TRIAMCINOLONE 0.1 % CREAM:EUCERIN CREAM 1:1: TOPICAL_CREAM | CUTANEOUS | Status: DC

## 2014-06-18 MED ORDER — DESOGESTREL-ETHINYL ESTRADIOL 0.15-30 MG-MCG PO TABS: 1.0000 | ORAL_TABLET | Freq: Every day | ORAL | Status: DC

## 2014-06-18 NOTE — Progress Notes (Signed)
Subjective:    Patient ID: Kelly Hunter, female    DOB: [birth year], 24 y.o.   MRN: [id number]  HPI This 24 y.o. female presents for Complete Physical Examination.  Last physical:  05-03-2013 Pap smear:  05-03-2013; regular menses (3 days on OCP). TDAP:  11-18-2013 Pneumovax:  12-31-2013 Gardisil: completed 12/2013 Hepatitis A:  05-21-2014 Influenza:  11-18-2013 Eye exam:  Overdue; +glasses and contacts Dental exam:  Overdue; needs cleaning. Needs wisdom teeth extracted.    Insomnia:  Taking Trazodone 1 at bedtime; then took the next night with sleeplessness; not very helpful.  Would like trial of different medication.  Taking every night.   Review of Systems  Constitutional: Positive for activity change. Negative for fever, chills, diaphoresis, appetite change, fatigue and unexpected weight change.  HENT: Positive for ear pain. Negative for congestion, dental problem, drooling, ear discharge, facial swelling, hearing loss, mouth sores, nosebleeds, postnasal drip, rhinorrhea, sinus pressure, sneezing, sore throat, tinnitus, trouble swallowing and voice change.   Eyes: Negative.  Negative for photophobia, pain, discharge, redness, itching and visual disturbance.  Respiratory: Negative.  Negative for apnea, cough, choking, chest tightness, shortness of breath, wheezing and stridor.   Cardiovascular: Negative.  Negative for chest pain, palpitations and leg swelling.  Gastrointestinal: Negative.  Negative for nausea, vomiting, abdominal pain, diarrhea, constipation, blood in stool, abdominal distention, anal bleeding and rectal pain.  Endocrine: Negative.  Negative for cold intolerance, heat intolerance, polydipsia, polyphagia and polyuria.  Genitourinary: Negative.  Negative for dysuria, urgency, frequency, hematuria, flank pain, decreased urine volume, vaginal bleeding, vaginal discharge, enuresis, difficulty urinating, genital sores, vaginal pain, menstrual problem, pelvic pain and  dyspareunia.  Musculoskeletal: Negative.  Negative for myalgias, back pain, joint swelling, arthralgias, gait problem, neck pain and neck stiffness.  Skin: Negative.  Negative for color change, pallor, rash and wound.  Allergic/Immunologic: Positive for environmental allergies and food allergies. Negative for immunocompromised state.  Neurological: Negative.  Negative for dizziness, tremors, seizures, syncope, facial asymmetry, speech difficulty, weakness, light-headedness, numbness and headaches.  Hematological: Negative.  Negative for adenopathy. Does not bruise/bleed easily.  Psychiatric/Behavioral: Positive for sleep disturbance. Negative for suicidal ideas, hallucinations, behavioral problems, confusion, self-injury, dysphoric mood, decreased concentration and agitation. The patient is not nervous/anxious and is not hyperactive.    Past Medical History  Diagnosis Date  . Allergy     Allegra  . Asthma     mild; onset in childhood.  No hospitalizations.  . Eczema   . Acne   . Hidradenitis   . CIN I (cervical intraepithelial neoplasia I) 08/31/2013    colposcopy by Toney Rakes; s/p CO2 laser treatment.  Marland Kitchen VIN I (vulvar intraepithelial neoplasia I) 08/31/2013    s/p CO2 treatment; Fernandez/gyn.  . Genital warts 08/31/2013    Aldara treatment; Toney Rakes.  . Chlamydia 03/03/2012  . HIV (human immunodeficiency virus infection)   . Substance abuse   . Hidradenitis axillaris 05/21/2014   Past Surgical History  Procedure Laterality Date  . Breast surgery  02/01/2008    Reduction  . Co2 laser application N/A 05/09/8117    Procedure: C02 laser of vagina and cervix;  Surgeon: Terrance Mass, MD;  Location: [facility];  Service: Gynecology;  Laterality: N/A;   Allergies  Allergen Reactions  . Other Nausea And Vomiting and Swelling    Tree Nuts (Almonds)  . Peanut-Containing Drug Products    History   Social History  . Marital Status: Single    Spouse Name: n/a  . Number of Children:  0  . Years  of Education: N/A   Occupational History  . Manager     [employer]   Social History Main Topics  . Smoking status: Never Smoker   . Smokeless tobacco: Never Used  . Alcohol Use: No     Comment: previously used socially  . Drug Use: No  . Sexual Activity:    Partners: Male    Birth Control/ Protection: Pill     Comment: 4 total sexual partners; history of Chlamydia in 03/2012. Genital warts 08/31/2013.   Other Topics Concern  . Not on file   Social History Narrative   Marital status: single;  Not dating in 2016.      Children: none; never pregnant.      Lives: with mom; dad in [place].     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      Education: previously in school; took medical leave in 02/2012; [employer] in [place].      Tobacco: vapor cigarette two months. Pt does not smoke.       Alcohol:  Socially; weekends; no DUIs.      Drugs: marijuana daily.  Started at age 63.        Sexual activity: sexually active; 25 total partners; males; Chlamydia in 03/2012.  Genital warts 08/2013.  HIV diagnosis 12/2013.      Seatbelt: 100%      Guns:  none   Family History  Problem Relation Age of Onset  . Diabetes Mother   . Hypertension Mother   . Hyperlipidemia Mother   . Mental illness Mother   . Cancer Maternal Grandfather     ?       Objective:   Physical Exam  Constitutional: She is oriented to person, place, and time. She appears well-developed and well-nourished. No distress.  obese  HENT:  Head: Normocephalic and atraumatic.  Right Ear: External ear normal.  Left Ear: External ear normal.  Nose: Nose normal.  Mouth/Throat: Oropharynx is clear and moist.  Eyes: Conjunctivae and EOM are normal. Pupils are equal, round, and reactive to light.  Neck: Normal range of motion and full passive range of motion without pain. Neck supple. No JVD present. Carotid bruit is not present. No thyromegaly present.  Cardiovascular: Normal rate, regular  rhythm and normal heart sounds.  Exam reveals no gallop and no friction rub.   No murmur heard. Pulmonary/Chest: Effort normal and breath sounds normal. She has no wheezes. She has no rales. Right breast exhibits no inverted nipple, no mass, no nipple discharge, no skin change and no tenderness. Left breast exhibits no inverted nipple, no mass, no nipple discharge, no skin change and no tenderness. Breasts are symmetrical.  Abdominal: Soft. Bowel sounds are normal. She exhibits no distension and no mass. There is no tenderness. There is no rebound and no guarding.  Genitourinary: Vagina normal and uterus normal.    No labial fusion. There is lesion on the right labia. There is no rash, tenderness or injury on the right labia. There is lesion on the left labia. There is no rash or tenderness on the left labia. Cervix exhibits no motion tenderness, no discharge and no friability. Right adnexum displays no mass, no tenderness and no fullness. Left adnexum displays no mass, no tenderness and no fullness.  Skin changes versus lesions inferior aspect of introitus.  Musculoskeletal:       Right shoulder: Normal.  Left shoulder: Normal.       Cervical back: Normal.  Lymphadenopathy:    She has no cervical adenopathy.  Neurological: She is alert and oriented to person, place, and time. She has normal reflexes. No cranial nerve deficit. She exhibits normal muscle tone. Coordination normal.  Skin: Skin is warm and dry. Rash noted. She is not diaphoretic. No erythema. No pallor.  Few scattered maculopapular lesions versus pustules along wrist and hands.  Psychiatric: She has a normal mood and affect. Her behavior is normal. Judgment and thought content normal.  Nursing note and vitals reviewed.         Assessment & Plan:   1. Routine physical examination   2. Encounter for routine gynecological examination   3. LGSIL (low grade squamous intraepithelial dysplasia)   4. High risk HPV infection    5. Insomnia   6. HIV disease   7. Dysplasia of cervix, low grade (CIN 1)   8. VIN I (vulvar intraepithelial neoplasia I)     1. Routine Physical Examination: anticipatory guidance --- weight loss, exercise encouraged.  Recommend safe driving practices; emphasized safe sexual practices. Pap smear obtained.  Refill of Apri provided. 2.  Routine gynecological exam: pap smear obtained; s/p Gardisil series.  OCP for contraception is working well for patient. 3. Histoyr of CINI: with HIV; encouraged ongoing surveillance by gynecology yearly. 4.  VINI history: followed by gynecology/Fernandez; encourage yearly surveillance by gynecology. 5.  HIV disease: stable; followed by infectious disease closely; tolerating medication.  No recent sexual activity. 6.  Insomnia: uncontrolled due to work schedule; recommend increasing Trazodone to 2-3 tablets qhs.   Meds ordered this encounter  Medications  . desogestrel-ethinyl estradiol (APRI,EMOQUETTE,SOLIA) 0.15-30 MG-MCG tablet    Sig: Take 1 tablet by mouth daily.    Dispense:  2 Package    Refill:  7    CONTINUOUS CYCLING; SKIP PLACEBO WEEK/PILLS.  Marland Kitchen DISCONTD: Triamcinolone Acetonide (TRIAMCINOLONE 0.1 % CREAM : EUCERIN) CREA    Sig: Use as directed    Dispense:  480 each    Refill:  11    1:1 ratio; Dispense:  1 pound jar (480grams).  . Triamcinolone Acetonide (TRIAMCINOLONE 0.1 % CREAM : EUCERIN) CREA    Sig: Use as directed    Dispense:  480 each    Refill:  11    1:1 ratio; Dispense:  1 pound jar (480grams).    Tabias Swayze Elayne Guerin, M.D. [facility] [address] [place], [place]  [address] [phone] phone [phone] fax

## 2014-06-18 NOTE — Patient Instructions (Signed)

## 2014-06-20 LAB — PAP IG, CT-NG NAA, HPV HIGH-RISK
Chlamydia Probe Amp: NEGATIVE
GC Probe Amp: NEGATIVE
HPV DNA High Risk: DETECTED — AB

## 2014-06-21 ENCOUNTER — Telehealth: Payer: Self-pay | Admitting: Family Medicine

## 2014-06-21 NOTE — Telephone Encounter (Signed)
lmom to call and reschedule her appt that she had with Tamala Julian

## 2014-07-14 ENCOUNTER — Ambulatory Visit (INDEPENDENT_AMBULATORY_CARE_PROVIDER_SITE_OTHER): Payer: 59 | Admitting: Family Medicine

## 2014-07-14 ENCOUNTER — Ambulatory Visit: Payer: 59 | Admitting: Gynecology

## 2014-07-14 VITALS — BP 120/80 | HR 93 | Temp 97.5°F | Resp 18 | Ht 64.0 in | Wt 241.0 lb

## 2014-07-14 DIAGNOSIS — B20 Human immunodeficiency virus [HIV] disease: Secondary | ICD-10-CM

## 2014-07-14 DIAGNOSIS — J029 Acute pharyngitis, unspecified: Secondary | ICD-10-CM

## 2014-07-14 DIAGNOSIS — H109 Unspecified conjunctivitis: Secondary | ICD-10-CM | POA: Diagnosis not present

## 2014-07-14 DIAGNOSIS — J0101 Acute recurrent maxillary sinusitis: Secondary | ICD-10-CM

## 2014-07-14 MED ORDER — AMOXICILLIN-POT CLAVULANATE 875-125 MG PO TABS: 1.0000 | ORAL_TABLET | Freq: Two times a day (BID) | ORAL | Status: DC

## 2014-07-14 MED ORDER — POLYMYXIN B-TRIMETHOPRIM 10000-0.1 UNIT/ML-% OP SOLN: 2.0000 [drp] | OPHTHALMIC | Status: DC

## 2014-07-14 NOTE — Patient Instructions (Signed)

## 2014-07-14 NOTE — Progress Notes (Signed)
Subjective:    Patient ID: Kelly Hunter, female    DOB: 03-27-1990, 24 y.o.   MRN: [id number]  07/14/2014  URI   HPI This 24 y.o. female with HIV (viral load undetectable and CD4 count of 510 in 05/2014), allergic rhinitis, asthma who presents for evaluation of cold symptoms. Onset five days ago with sore throat.  No fever/chills/sweats.  +HA initially but has resolved.  Concerned about sinus infection.  Researched symptoms; concerned about sinus infection.  Still has sore throat.  ST is diffuse: +pain with swallowing.  No pain with talking.  Hard to breath due to sore throat, nasal congestion.  +rhinorrhea and nasal congestion; thick yellow congestion.  Just started coughing two days ago.  +PND; +green sputum.  Sweet tasting congestion.  R eye became red today.  No n/v/d.  No rash.  Did have an itchy rash a few weeks ago.  Alkeseltzer Plus pills.  Started with Benadryl because thought it was allergies.  Recent cat exposure so thought due to allergies.  Benadryl did not help.  Then started taking Alkeseltzer for two days.  Working two jobs in Human resources officer.  Missed work today due to pink eye.  Three nose bleeds this week with acute illness.   Review of Systems  Constitutional: Negative for fever, chills, diaphoresis and fatigue.  HENT: Positive for congestion, postnasal drip, rhinorrhea, sore throat and voice change. Negative for ear pain, sinus pressure, sneezing and trouble swallowing.   Eyes: Positive for discharge and redness. Negative for photophobia, pain, itching and visual disturbance.  Respiratory: Positive for cough. Negative for shortness of breath.   Cardiovascular: Negative for chest pain, palpitations and leg swelling.  Gastrointestinal: Negative for nausea, vomiting, abdominal pain, diarrhea and constipation.  Endocrine: Negative for cold intolerance, heat intolerance, polydipsia, polyphagia and polyuria.  Skin: Negative for rash.  Neurological: Positive for weakness and  headaches. Negative for dizziness and light-headedness.    Past Medical History  Diagnosis Date  . Allergy     Allegra  . Asthma     mild; onset in childhood.  No hospitalizations.  . Eczema   . Acne   . Hidradenitis   . CIN I (cervical intraepithelial neoplasia I) 08/31/2013    colposcopy by Toney Rakes; s/p CO2 laser treatment.  Marland Kitchen VIN I (vulvar intraepithelial neoplasia I) 08/31/2013    s/p CO2 treatment; [facility]/gyn.  . Genital warts 08/31/2013    Aldara treatment; Toney Rakes.  . Chlamydia 03/03/2012  . HIV (human immunodeficiency virus infection)   . Substance abuse   . Hidradenitis axillaris 05/21/2014   Past Surgical History  Procedure Laterality Date  . Breast surgery  02/01/2008    Reduction  . Co2 laser application N/A 7/82/9562    Procedure: C02 laser of vagina and cervix;  Surgeon: Terrance Mass, MD;  Location: [facility];  Service: Gynecology;  Laterality: N/A;   Allergies  Allergen Reactions  . Other Nausea And Vomiting and Swelling    Tree Nuts (Almonds)  . Peanut-Containing Drug Products    Current Outpatient Prescriptions  Medication Sig Dispense Refill  . Abacavir-Dolutegravir-Lamivud (TRIUMEQ) 600-50-300 MG TABS Take 1 tablet by mouth daily. 30 tablet 11  . desogestrel-ethinyl estradiol (APRI,EMOQUETTE,SOLIA) 0.15-30 MG-MCG tablet Take 1 tablet by mouth daily. 2 Package 7  . doxycycline (DORYX) 100 MG EC tablet Take 100 mg by mouth 2 (two) times daily.    . imiquimod (ALDARA) 5 % cream Apply topically 3 (three) times a week. 12 each 0  . traZODone (  DESYREL) 50 MG tablet Take 1-2 tablets (50-100 mg total) by mouth at bedtime as needed for sleep. 60 tablet 3  . Triamcinolone Acetonide (TRIAMCINOLONE 0.1 % CREAM : EUCERIN) CREA Use as directed 480 each 11  . amoxicillin-clavulanate (AUGMENTIN) 875-125 MG per tablet Take 1 tablet by mouth 2 (two) times daily. 20 tablet 0  . trimethoprim-polymyxin b (POLYTRIM) ophthalmic solution Place 2 drops into the right eye every 4  (four) hours. 10 mL 0   No current facility-administered medications for this visit.   History   Social History  . Marital Status: Single    Spouse Name: n/a  . Number of Children: 0  . Years of Education: N/A   Occupational History  . Manager     [employer]   Social History Main Topics  . Smoking status: Never Smoker   . Smokeless tobacco: Never Used  . Alcohol Use: No     Comment: previously used socially  . Drug Use: No  . Sexual Activity:    Partners: Male    Birth Control/ Protection: Pill     Comment: 4 total sexual partners; history of Chlamydia in 03/2012. Genital warts 08/31/2013.   Other Topics Concern  . Not on file   Social History Narrative   Marital status: single;  Not dating in 2016.      Children: none; never pregnant.      Lives: with mom; dad in [place].     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      Education: previously in school; took medical leave in 02/2012; [employer] in [place].      Tobacco: vapor cigarette two months. Pt does not smoke.       Alcohol:  Socially; weekends; no DUIs.      Drugs: marijuana daily.  Started at age 82.        Sexual activity: sexually active; 25 total partners; males; Chlamydia in 03/2012.  Genital warts 08/2013.  HIV diagnosis 12/2013.      Seatbelt: 100%      Guns:  none        Objective:    BP 120/80 mmHg  Pulse 93  Temp(Src) 97.5 F (36.4 C) (Oral)  Resp 18  Ht 5\' 4"  (1.626 m)  Wt 241 lb (109.317 kg)  BMI 41.35 kg/m2  SpO2 98% Physical Exam  Constitutional: She is oriented to person, place, and time. She appears well-developed and well-nourished. No distress.  Obese  HENT:  Head: Normocephalic and atraumatic.  Right Ear: Tympanic membrane, external ear and ear canal normal.  Left Ear: Tympanic membrane, external ear and ear canal normal.  Nose: Mucosal edema present. Right sinus exhibits no maxillary sinus tenderness and no frontal sinus tenderness. Left sinus  exhibits no maxillary sinus tenderness and no frontal sinus tenderness.  Mouth/Throat: Oropharynx is clear and moist and mucous membranes are normal. No oropharyngeal exudate.  Eyes: EOM are normal. Pupils are equal, round, and reactive to light. Right eye exhibits discharge. Right eye exhibits no hordeolum. Left eye exhibits no discharge and no hordeolum. Right conjunctiva is injected. Right conjunctiva has no hemorrhage. Left conjunctiva is not injected. Left conjunctiva has no hemorrhage.  Neck: Normal range of motion. Neck supple.  Cardiovascular: Normal rate, regular rhythm and normal heart sounds.  Exam reveals no gallop and no friction rub.   No murmur heard. Pulmonary/Chest: Effort normal and breath sounds normal. She has no wheezes. She has no rales.  Lymphadenopathy:    She has cervical adenopathy.  Neurological: She is alert and oriented to person, place, and time.  Skin: No rash noted. She is not diaphoretic.  Psychiatric: She has a normal mood and affect. Her behavior is normal.  Nursing note and vitals reviewed.       Assessment & Plan:   1. Acute recurrent maxillary sinusitis   2. Conjunctivitis of right eye   3. Sore throat   4. HIV disease    -New. -Onset with viral URI that is developing into acute sinusitis; treat with Augmentin.  Pt to start prescription allergy nasal spray daily.  Recommend nasal saline spray bid for nasal irritation that is leading to epistaxis. -RTC for acute worsening. -OOW note for today and tomorrow. -Recent viral load undetectable and CD4 count has remained stable.   Meds ordered this encounter  Medications  . amoxicillin-clavulanate (AUGMENTIN) 875-125 MG per tablet    Sig: Take 1 tablet by mouth 2 (two) times daily.    Dispense:  20 tablet    Refill:  0  . trimethoprim-polymyxin b (POLYTRIM) ophthalmic solution    Sig: Place 2 drops into the right eye every 4 (four) hours.    Dispense:  10 mL    Refill:  0    No Follow-up on  file.     Caelyn Route Elayne Guerin, M.D. [facility] [address] [place], [place]  [phone] phone [phone] fax

## 2014-07-31 ENCOUNTER — Ambulatory Visit (INDEPENDENT_AMBULATORY_CARE_PROVIDER_SITE_OTHER): Payer: 59 | Admitting: Gynecology

## 2014-07-31 ENCOUNTER — Encounter: Payer: Self-pay | Admitting: Gynecology

## 2014-07-31 VITALS — BP 128/86

## 2014-07-31 DIAGNOSIS — N9089 Other specified noninflammatory disorders of vulva and perineum: Secondary | ICD-10-CM | POA: Diagnosis not present

## 2014-07-31 DIAGNOSIS — Z87412 Personal history of vulvar dysplasia: Secondary | ICD-10-CM | POA: Diagnosis not present

## 2014-07-31 DIAGNOSIS — N870 Mild cervical dysplasia: Secondary | ICD-10-CM

## 2014-07-31 NOTE — Progress Notes (Addendum)
Patient is a 24 year old with history of HIV who was referred to our practice as today as a result of her recent abnormal Pap smear and abnormal findings on external genitalia. Her Pap smear demonstrated the following:  EPITHELIAL CELL ABNORMALITY: SQUAMOUS CELLS. LOW-GRADE SQUAMOUS  INTRAEPITHELIAL LESION (LSIL) ENCOMPASSING HPV/MILD DYSPLASIA/CIN 1  WITH A FEW CELLS SUSPICIOUS FOR A HIGH-GRADE LESION IDENTIFIED Negative GC and Chlamydia culture  Patient's past dysplasia history as follows: Patient was seen the office on 09/19/2013 for six-week postop visit. Patient was status post CO2 laser ablation of vulvar and cervical dysplasia and vulvar condyloma acuminatum as follows:  The CIN-1 cervix 12:00 position, VIn-1 and condylomata fourchette and perineum  It was noted during that visit that the area of the perineum whereby the CO2 laser was performed has healed completely but in the inner aspect of the fourchette there were 2 condylomatous areas still present. On speculum exam there were no cervical or vaginal lesions endocervical area had healed from the previous CO2 laser.  She was treated at that visit on August 20 with 50% TCA and was prescribed Aldara cream to apply 3 times a week for 3 months in follow-up visit reason she is here for today.  On November 17 there was a small area still present at the fourchette and she was treated with 50% TCA returns today for follow-up. On examination the area is much smaller so another treatment of 50% TCA was applied. She recently completed 3 months treatment of Aldara and completely went away. On last office visit of March 18 it was noted the superior aspect of the right labia majora and near the fourchette there was evidence of condyloma acuminatum and was treated with TCA 80%.   She was seen in the office in March of this year and the following was noted: Physical Exam  Genitourinary:      The new small area in the perineum with new  condylomatous growth was treated with 80% TCA. The other 2 areas that were previously treated were retreated once again but with 50% TCA. She is going to be given a prescription for Aldara cream to apply 3 times a week for the next 3 months. She is due for her annual exam in June and we'll take a look again. Instructions on the usage of Aldara was provided. She completed the treatment and her primary care physician that her recent exam and her Pap smear as described above.  Patient underwent a detail colposcopic evaluation today of the external genitalia, perineum perirectal area. Findings as follows: Physical Exam  Genitourinary:                Colposcopic evaluation of the vagina demonstrated an acetowhite area at the posterior vaginal fornix at  6:00 along with a second acetowhite lesion at the ectocervix also at 6:00. Endocervical speculum was utilized transformation zone was visualized.  Assessment/plan: 24 year old patient with history of HIV and recurrent cervical dysplasia as well as vulvar dysplasia probably as a result of her being compromised. Previous cervical lesion that was treated with laser not seen as well as the one in the external genitalia. Cervical biopsy was obtained today from the 6:00 ectocervix as well as the posterior vaginal fornix. ECC was obtained as well. Silver nitrate and Monsel solution was used for hemostasis.  The area the fourchette was cleansed with Betadine solution and 1% lidocaine was infiltrated any keypunch biopsy was obtained and was submitted for histological evaluation to rule out high-grade vulvar  dysplasia. Silver nitrate and Monsel solution was also used for hemostasis. Will await pathology report and manage her cornea.

## 2014-07-31 NOTE — Patient Instructions (Signed)
COLPOSCOPY POST-PROCEDURE INSTRUCTIONS  1. You may take Ibuprofen, Aleve or Tylenol for cramping if needed.  2. If Monsel's solution was used, you will have a black discharge.  3. Light bleeding is normal.  If bleeding is heavier than your period, please call.  4. Put nothing in your vagina until the bleeding or discharge stops (usually 2 or3 days).  5. We will call you within one week with biopsy results or discuss the results at your follow-up appointment if needed. 6.  

## 2014-07-31 NOTE — Addendum Note (Signed)
Addended by: Thurnell Garbe A on: 07/31/2014 03:24 PM   Modules accepted: Orders

## 2014-08-06 ENCOUNTER — Ambulatory Visit (INDEPENDENT_AMBULATORY_CARE_PROVIDER_SITE_OTHER): Payer: 59 | Admitting: Family Medicine

## 2014-08-06 ENCOUNTER — Encounter: Payer: Self-pay | Admitting: Family Medicine

## 2014-08-06 VITALS — BP 117/75 | HR 101 | Temp 98.3°F | Resp 16 | Ht 64.0 in | Wt 240.4 lb

## 2014-08-06 DIAGNOSIS — B373 Candidiasis of vulva and vagina: Secondary | ICD-10-CM

## 2014-08-06 DIAGNOSIS — N900 Mild vulvar dysplasia: Secondary | ICD-10-CM | POA: Diagnosis not present

## 2014-08-06 DIAGNOSIS — G4700 Insomnia, unspecified: Secondary | ICD-10-CM

## 2014-08-06 DIAGNOSIS — B20 Human immunodeficiency virus [HIV] disease: Secondary | ICD-10-CM

## 2014-08-06 DIAGNOSIS — N898 Other specified noninflammatory disorders of vagina: Secondary | ICD-10-CM

## 2014-08-06 DIAGNOSIS — L298 Other pruritus: Secondary | ICD-10-CM | POA: Diagnosis not present

## 2014-08-06 DIAGNOSIS — R896 Abnormal cytological findings in specimens from other organs, systems and tissues: Secondary | ICD-10-CM

## 2014-08-06 DIAGNOSIS — IMO0002 Reserved for concepts with insufficient information to code with codable children: Secondary | ICD-10-CM

## 2014-08-06 DIAGNOSIS — F430 Acute stress reaction: Secondary | ICD-10-CM

## 2014-08-06 DIAGNOSIS — B3731 Acute candidiasis of vulva and vagina: Secondary | ICD-10-CM

## 2014-08-06 LAB — POCT WET PREP WITH KOH
KOH Prep POC: POSITIVE
Trichomonas, UA: NEGATIVE
Yeast Wet Prep HPF POC: POSITIVE

## 2014-08-06 MED ORDER — FLUCONAZOLE 150 MG PO TABS: 150.0000 mg | ORAL_TABLET | Freq: Once | ORAL | Status: DC

## 2014-08-06 MED ORDER — ESZOPICLONE 3 MG PO TABS: 3.0000 mg | ORAL_TABLET | Freq: Every day | ORAL | Status: DC

## 2014-08-06 NOTE — Patient Instructions (Signed)
Insomnia Insomnia is frequent trouble falling and/or staying asleep. Insomnia can be a long term problem or a short term problem. Both are common. Insomnia can be a short term problem when the wakefulness is related to a certain stress or worry. Long term insomnia is often related to ongoing stress during waking hours and/or poor sleeping habits. Overtime, sleep deprivation itself can make the problem worse. Every little thing feels more severe because you are overtired and your ability to cope is decreased. CAUSES   Stress, anxiety, and depression.  Poor sleeping habits.  Distractions such as TV in the bedroom.  Naps close to bedtime.  Engaging in emotionally charged conversations before bed.  Technical reading before sleep.  Alcohol and other sedatives. They may make the problem worse. They can hurt normal sleep patterns and normal dream activity.  Stimulants such as caffeine for several hours prior to bedtime.  Pain syndromes and shortness of breath can cause insomnia.  Exercise late at night.  Changing time zones may cause sleeping problems (jet lag). It is sometimes helpful to have someone observe your sleeping patterns. They should look for periods of not breathing during the night (sleep apnea). They should also look to see how long those periods last. If you live alone or observers are uncertain, you can also be observed at a sleep clinic where your sleep patterns will be professionally monitored. Sleep apnea requires a checkup and treatment. Give your caregivers your medical history. Give your caregivers observations your family has made about your sleep.  SYMPTOMS   Not feeling rested in the morning.  Anxiety and restlessness at bedtime.  Difficulty falling and staying asleep. TREATMENT   Your caregiver may prescribe treatment for an underlying medical disorders. Your caregiver can give advice or help if you are using alcohol or other drugs for self-medication. Treatment  of underlying problems will usually eliminate insomnia problems.  Medications can be prescribed for short time use. They are generally not recommended for lengthy use.  Over-the-counter sleep medicines are not recommended for lengthy use. They can be habit forming.  You can promote easier sleeping by making lifestyle changes such as:  Using relaxation techniques that help with breathing and reduce muscle tension.  Exercising earlier in the day.  Changing your diet and the time of your last meal. No night time snacks.  Establish a regular time to go to bed.  Counseling can help with stressful problems and worry.  Soothing music and white noise may be helpful if there are background noises you cannot remove.  Stop tedious detailed work at least one hour before bedtime. HOME CARE INSTRUCTIONS   Keep a diary. Inform your caregiver about your progress. This includes any medication side effects. See your caregiver regularly. Take note of:  Times when you are asleep.  Times when you are awake during the night.  The quality of your sleep.  How you feel the next day. This information will help your caregiver care for you.  Get out of bed if you are still awake after 15 minutes. Read or do some quiet activity. Keep the lights down. Wait until you feel sleepy and go back to bed.  Keep regular sleeping and waking hours. Avoid naps.  Exercise regularly.  Avoid distractions at bedtime. Distractions include watching television or engaging in any intense or detailed activity like attempting to balance the household checkbook.  Develop a bedtime ritual. Keep a familiar routine of bathing, brushing your teeth, climbing into bed at the same   time each night, listening to soothing music. Routines increase the success of falling to sleep faster.  Use relaxation techniques. This can be using breathing and muscle tension release routines. It can also include visualizing peaceful scenes. You can  also help control troubling or intruding thoughts by keeping your mind occupied with boring or repetitive thoughts like the old concept of counting sheep. You can make it more creative like imagining planting one beautiful flower after another in your backyard garden.  During your day, work to eliminate stress. When this is not possible use some of the previous suggestions to help reduce the anxiety that accompanies stressful situations. MAKE SURE YOU:   Understand these instructions.  Will watch your condition.  Will get help right away if you are not doing well or get worse. Document Released: 01/15/2000 Document Revised: 04/11/2011 Document Reviewed: 02/14/2007 Exit[employer] Patient Information 2015 [employer]. This information is not intended to replace advice given to you by your health care provider. Make sure you discuss any questions you have with your health care provider.  

## 2014-08-06 NOTE — Progress Notes (Signed)
Subjective:    Patient ID: Kelly Hunter, female    DOB: 14-Mar-1990, 24 y.o.   MRN: [id number]  08/06/2014  2 month follow up insomnia/stress   HPI This 24 y.o. female presents for two month follow-up:  1. Insomnia:  Not sleeping well.  Has been taking Trazodone 3 tablets at bedtime.  Sleeping pattern is really off; working some mornings and some nights.  Has tried mother's Ambien which worked well. Not aware of dose or type.  Tries to take Trazodone at 10:00pm; takes shower; winds down.  Still up all night.  Shower in middle of night might help.   2.  Acute stress reaction:  Always irritable.  Really thinks multifactorial.  Always working if not sleeping.  Does not smoke all day long like other marijuana smokers; only smokes at night to help with relaxation and for sleep.  No marijuana in months and now has been irritable.  Not interested in Prozac.  Working less; now working 40 hours per week. 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 No SI/HI.  No therapist appointment yet due to upfront cost.    3.  LGSIL pap smear cannot rule out high grade dysplasia: s/p colposcopy by Dr. Toney Rakes.  Awaiting pathology results.  Very reluctant to undergo colposcopy.  Just as painful as first experience.    4.  HIV: next appointment due; needs to schedule.  To call in June but did not.    5.  Vaginal itching and vaginal discharge:  Onset after procedure at Dr. Sandrea Hughs office.  No sexual activity in past two months.  Last sexual activity three months ago prior to CPE in 06/2014.  6.  Eczema: no recent follow-up with dermatology.  Having new spots on skin.  Now has white spots at areas where eczema was.  Looks weird.  Interested in fading cream.  7. Allergic rhinitis and asthma:  Did not need to follow-up; allergy testing showed cashews only.   Still has Epipen.  Prescribed nasal spray and allergy eye drops.       Review of Systems  Constitutional: Negative  for fever, chills, diaphoresis and fatigue.  Eyes: Negative for visual disturbance.  Respiratory: Negative for cough and shortness of breath.   Cardiovascular: Negative for chest pain, palpitations and leg swelling.  Gastrointestinal: Negative for nausea, vomiting, abdominal pain, diarrhea and constipation.  Endocrine: Negative for cold intolerance, heat intolerance, polydipsia, polyphagia and polyuria.  Genitourinary: Positive for vaginal discharge, genital sores and vaginal pain. Negative for dysuria, urgency, frequency, hematuria, flank pain, vaginal bleeding and pelvic pain.  Skin: Positive for color change and rash.  Neurological: Negative for dizziness, tremors, seizures, syncope, facial asymmetry, speech difficulty, weakness, light-headedness, numbness and headaches.  Psychiatric/Behavioral: Positive for sleep disturbance. Negative for suicidal ideas, self-injury and dysphoric mood. The patient is nervous/anxious.     Past Medical History  Diagnosis Date  . Allergy     Allegra  . Asthma     mild; onset in childhood.  No hospitalizations.  . Eczema   . Acne   . Hidradenitis   . CIN I (cervical intraepithelial neoplasia I) 08/31/2013    colposcopy by Toney Rakes; s/p CO2 laser treatment.  Marland Kitchen VIN I (vulvar intraepithelial neoplasia I) 08/31/2013    s/p CO2 treatment; [facility]andez/gyn.  . Genital warts 08/31/2013    Aldara treatment; Toney Rakes.  . Chlamydia 03/03/2012  . HIV (human immunodeficiency virus infection)   . Substance abuse   .  Hidradenitis axillaris 05/21/2014   Past Surgical History  Procedure Laterality Date  . Breast surgery  02/01/2008    Reduction  . Co2 laser application N/A 6/96/2952    Procedure: C02 laser of vagina and cervix;  Surgeon: Terrance Mass, MD;  Location: [facility];  Service: Gynecology;  Laterality: N/A;   Allergies  Allergen Reactions  . Other Nausea And Vomiting and Swelling    Tree Nuts (Almonds)  . Peanut-Containing Drug Products     History    Social History  . Marital Status: Single    Spouse Name: n/a  . Number of Children: 0  . Years of Education: N/A   Occupational History  . Manager     [employer]   Social History Main Topics  . Smoking status: Never Smoker   . Smokeless tobacco: Never Used  . Alcohol Use: No     Comment: previously used socially  . Drug Use: No  . Sexual Activity:    Partners: Male    Birth Control/ Protection: Pill     Comment: 4 total sexual partners; history of Chlamydia in 03/2012. Genital warts 08/31/2013.   Other Topics Concern  . Not on file   Social History Narrative   Marital status: single;  Not dating in 2016.      Children: none; never pregnant.      Lives: with mom; dad in [place].     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      Education: previously in school; took medical leave in 02/2012; [employer] in [place].      Tobacco: vapor cigarette two months. Pt does not smoke.       Alcohol:  Socially; weekends; no DUIs.      Drugs: marijuana daily.  Started at age 12.        Sexual activity: sexually active; 25 total partners; males; Chlamydia in 03/2012.  Genital warts 08/2013.  HIV diagnosis 12/2013.      Seatbelt: 100%      Guns:  none       Objective:    BP 117/75 mmHg  Pulse 101  Temp(Src) 98.3 F (36.8 C) (Oral)  Resp 16  Ht 5\' 4"  (1.626 m)  Wt 240 lb 6.4 oz (109.045 kg)  BMI 41.24 kg/m2  LMP 07/15/2014 Physical Exam  Constitutional: She is oriented to person, place, and time. She appears well-developed and well-nourished. No distress.  obese  HENT:  Head: Normocephalic and atraumatic.  Right Ear: External ear normal.  Left Ear: External ear normal.  Nose: Nose normal.  Mouth/Throat: Oropharynx is clear and moist.  Eyes: Conjunctivae and EOM are normal. Pupils are equal, round, and reactive to light.  Neck: Normal range of motion. Neck supple. Carotid bruit is not present. No thyromegaly present.  Cardiovascular: Normal  rate, regular rhythm, normal heart sounds and intact distal pulses.  Exam reveals no gallop and no friction rub.   No murmur heard. Pulmonary/Chest: Effort normal and breath sounds normal. She has no wheezes. She has no rales.  Abdominal: Soft. Bowel sounds are normal. She exhibits no distension and no mass. There is no tenderness. There is no rebound and no guarding.  Genitourinary:  Pt refused pelvic exam due to recent colposcopy and vulvar bx.  Lymphadenopathy:    She has no cervical adenopathy.  Neurological: She is alert and oriented to person, place, and time. No cranial nerve deficit.  Skin: Skin is warm and dry.  No rash noted. She is not diaphoretic. No erythema. No pallor.  Psychiatric: She has a normal mood and affect. Her behavior is normal.   Results for orders placed or performed in visit on 08/06/14  POCT Wet Prep with KOH  Result Value Ref Range   Trichomonas, UA Negative    Clue Cells Wet Prep HPF POC moderate    Epithelial Wet Prep HPF POC Many Few, Moderate, Many   Yeast Wet Prep HPF POC Positive    Bacteria Wet Prep HPF POC Many (A) Few   RBC Wet Prep HPF POC Moderate    WBC Wet Prep HPF POC Moderate    KOH Prep POC Positive        Assessment & Plan:   1. Vaginal itching   2. Insomnia   3. Stress reaction   4. HIV disease   5. LGSIL (low grade squamous intraepithelial dysplasia)   6. VIN I (vulvar intraepithelial neoplasia I)   7. Candidiasis of female genitalia     1.  Candidiasis vulvovaginal: New.  Rx for Diflucan provided. Pt refused pelvic exam. Self wet prep obtained. 2.  Insomnia: uncontrolled with Trazodone; stop Trazodone; try Lunesta 3mg  qhs. RTC two months. 3.  Stress reaction/anxiety: worsening with more irritability; treat insomnia and reevaluate.  Recommend counseling. 4.  HIV disease: stable; overdue for follow-up with ID; to schedule appointment this month. 5.  LGSIL: New; s/p colposcopy and vulvar bx by Toney Rakes last week; awaiting  pathology. 6.  VIN I: s/p repeat bx last week by gynecology.   Meds ordered this encounter  Medications  . Eszopiclone 3 MG TABS    Sig: Take 1 tablet (3 mg total) by mouth at bedtime. Take immediately before bedtime    Dispense:  30 tablet    Refill:  5  . fluconazole (DIFLUCAN) 150 MG tablet    Sig: Take 1 tablet (150 mg total) by mouth once. Repeat if needed    Dispense:  2 tablet    Refill:  0    Return in about 2 months (around 10/07/2014).    Vana Arif Elayne Guerin, M.D. [facility] [address] [place], [place]  [address] [phone] phone [phone] fax

## 2014-08-13 ENCOUNTER — Telehealth: Payer: Self-pay

## 2014-08-13 ENCOUNTER — Telehealth: Payer: Self-pay | Admitting: Gynecology

## 2014-08-13 NOTE — Telephone Encounter (Signed)
Telephone conversation today with GYN oncologist Dr. Everitt Amber in reference to this patient's recurrent cervical dysplasia and vulvar condyloma. Patient with history of HIV who at times becomes immunocompromise which may be contributing to her recurrence of mild dysplasia. I discussed with Dr. Denman George today the recent colposcopic directed biopsy which demonstrated the following:  Diagnosis 1. Endocervix, curettage - BENIGN ENDOCERVICAL MUCOSAL FRAGMENTS ADMIXED WITH BLOOD. - NO ATYPIA OR MALIGNANCY IDENTIFIED. 2. Vulva, biopsy, fourchette - CONDYLOMA ACUMINATUM. - SEE COMMENT. 3. Cervix, biopsy, 6-7 o'clock ectocervix - TRANSFORMATION ZONE MUCOSA WITH LOW GRADE SQUAMOUS INTRAEPITHELIAL LESION, CIN-I (MILD DYSPLASIA). - SEE COMMENT. 4. Vagina, biopsy, posterior vaginal fornix - SQUAMOUS MUCOSA WITH LOW GRADE SQUAMOUS INTRAEPITHELIAL LESION, CIN-I (MILD DYSPLASIA). - ASSOCIATED CHRONIC CERVICITIS. - SEE COMMENT.  GYN oncologist recommended to follow-up with colposcopy every 6 months and treat external condyloma when becomes evident either with Aldara or TCA and if any higher grade dysplasia in the vagina or cervix to treat accordingly or to contact her as well. This information will be related to the patient which she comes in for follow-up.

## 2014-08-13 NOTE — Telephone Encounter (Signed)
Dr. Moshe Salisbury staff messaged me with the following:  "Please inform patient that I talked with a GYN oncologist today and that the recommendation is to follow-up with colposcopy every 6 months and no treatment recommended at this time. Her pathology report demonstrated mild dysplasia which may last forever not advance her progress but we'll need to follow. If she develops any external genital warts we can treat in the office or with the Aldara cream as discussed before ."  Left message for patient to call me.

## 2014-08-13 NOTE — Telephone Encounter (Signed)
I spoke with patient and relayed this to her. I stressed importance of follow up in 6 mos and she expressed that she understood importance. I have placed a recall for 6 mos.

## 2014-08-18 ENCOUNTER — Institutional Professional Consult (permissible substitution): Payer: 59 | Admitting: Gynecology

## 2014-08-25 ENCOUNTER — Ambulatory Visit: Payer: 59 | Admitting: Family Medicine

## 2014-10-08 ENCOUNTER — Ambulatory Visit: Payer: 59 | Admitting: Family Medicine

## 2014-10-10 ENCOUNTER — Ambulatory Visit: Payer: 59 | Admitting: Family Medicine

## 2014-11-12 ENCOUNTER — Other Ambulatory Visit: Payer: 59

## 2014-11-12 DIAGNOSIS — Z79899 Other long term (current) drug therapy: Secondary | ICD-10-CM

## 2014-11-12 DIAGNOSIS — B20 Human immunodeficiency virus [HIV] disease: Secondary | ICD-10-CM

## 2014-11-12 DIAGNOSIS — Z113 Encounter for screening for infections with a predominantly sexual mode of transmission: Secondary | ICD-10-CM

## 2014-11-12 LAB — COMPLETE METABOLIC PANEL WITH GFR
ALK PHOS: 41 U/L (ref 33–115)
ALT: 11 U/L (ref 6–29)
AST: 12 U/L (ref 10–30)
Albumin: 4 g/dL (ref 3.6–5.1)
BILIRUBIN TOTAL: 0.3 mg/dL (ref 0.2–1.2)
BUN: 7 mg/dL (ref 7–25)
CO2: 26 mmol/L (ref 20–31)
Calcium: 9.2 mg/dL (ref 8.6–10.2)
Chloride: 104 mmol/L (ref 98–110)
Creat: 0.63 mg/dL (ref 0.50–1.10)
GFR, Est African American: >89 mL/min (ref >=60)
GFR, Est Non African American: >89 mL/min (ref >=60)
GLUCOSE: 129 mg/dL — AB (ref 65–99)
POTASSIUM: 4.7 mmol/L (ref 3.5–5.3)
SODIUM: 136 mmol/L (ref 135–146)
TOTAL PROTEIN: 7.4 g/dL (ref 6.1–8.1)

## 2014-11-12 LAB — CBC WITH DIFFERENTIAL/PLATELET
BASOS PCT: 0 % (ref 0–1)
Basophils Absolute: 0 10*3/uL (ref 0.0–0.1)
Eosinophils Absolute: 0.2 10*3/uL (ref 0.0–0.7)
Eosinophils Relative: 2 % (ref 0–5)
HEMATOCRIT: 36.2 % (ref 36.0–46.0)
Hemoglobin: 12.6 g/dL (ref 12.0–15.0)
LYMPHS ABS: 1.9 10*3/uL (ref 0.7–4.0)
Lymphocytes Relative: 20 % (ref 12–46)
MCH: 28.4 pg (ref 26.0–34.0)
MCHC: 34.8 g/dL (ref 30.0–36.0)
MCV: 81.7 fL (ref 78.0–100.0)
MONO ABS: 0.6 10*3/uL (ref 0.1–1.0)
MPV: 9 fL (ref 8.6–12.4)
Monocytes Relative: 6 % (ref 3–12)
NEUTROS ABS: 6.8 10*3/uL (ref 1.7–7.7)
NEUTROS PCT: 72 % (ref 43–77)
Platelets: 387 10*3/uL (ref 150–400)
RBC: 4.43 MIL/uL (ref 3.87–5.11)
RDW: 14.7 % (ref 11.5–15.5)
WBC: 9.5 10*3/uL (ref 4.0–10.5)

## 2014-11-12 LAB — LIPID PANEL
Cholesterol: 166 mg/dL (ref 125–200)
HDL: 63 mg/dL (ref >=46)
LDL CALC: 82 mg/dL (ref <130)
Total CHOL/HDL Ratio: 2.6 Ratio (ref <=5.0)
Triglycerides: 104 mg/dL (ref <150)
VLDL: 21 mg/dL (ref <30)

## 2014-11-13 LAB — T-HELPER CELL (CD4) - (RCID CLINIC ONLY)
CD4 T CELL HELPER: 37 % (ref 33–55)
CD4 T Cell Abs: 690 /uL (ref 400–2700)

## 2014-11-13 LAB — RPR

## 2014-11-13 LAB — URINE CYTOLOGY ANCILLARY ONLY
Chlamydia: NEGATIVE
Neisseria Gonorrhea: NEGATIVE

## 2014-11-14 LAB — MICROALBUMIN / CREATININE URINE RATIO
Creatinine, Urine: 231.7 mg/dL
Microalb Creat Ratio: 42.3 mg/g — ABNORMAL HIGH (ref 0.0–30.0)
Microalb, Ur: 9.8 mg/dL — ABNORMAL HIGH

## 2014-11-14 LAB — HIV-1 RNA QUANT-NO REFLEX-BLD
HIV 1 RNA Quant: 26 copies/mL — ABNORMAL HIGH (ref <20)
HIV-1 RNA QUANT, LOG: 1.41 {Log} — AB (ref <1.30)

## 2014-11-17 ENCOUNTER — Ambulatory Visit (INDEPENDENT_AMBULATORY_CARE_PROVIDER_SITE_OTHER): Payer: 59 | Admitting: Family Medicine

## 2014-11-17 ENCOUNTER — Encounter: Payer: Self-pay | Admitting: Family Medicine

## 2014-11-17 VITALS — BP 136/70 | HR 83 | Temp 98.9°F | Resp 16 | Wt 246.6 lb

## 2014-11-17 DIAGNOSIS — E119 Type 2 diabetes mellitus without complications: Secondary | ICD-10-CM | POA: Insufficient documentation

## 2014-11-17 DIAGNOSIS — L309 Dermatitis, unspecified: Secondary | ICD-10-CM | POA: Diagnosis not present

## 2014-11-17 DIAGNOSIS — Z23 Encounter for immunization: Secondary | ICD-10-CM

## 2014-11-17 DIAGNOSIS — G4700 Insomnia, unspecified: Secondary | ICD-10-CM

## 2014-11-17 DIAGNOSIS — R739 Hyperglycemia, unspecified: Secondary | ICD-10-CM | POA: Diagnosis not present

## 2014-11-17 DIAGNOSIS — Z21 Asymptomatic human immunodeficiency virus [HIV] infection status: Secondary | ICD-10-CM

## 2014-11-17 DIAGNOSIS — B3731 Acute candidiasis of vulva and vagina: Secondary | ICD-10-CM

## 2014-11-17 DIAGNOSIS — B373 Candidiasis of vulva and vagina: Secondary | ICD-10-CM | POA: Diagnosis not present

## 2014-11-17 HISTORY — DX: Type 2 diabetes mellitus without complications: E11.9

## 2014-11-17 LAB — POCT GLYCOSYLATED HEMOGLOBIN (HGB A1C): Hemoglobin A1C: 6.9

## 2014-11-17 LAB — HEMOGLOBIN A1C: HEMOGLOBIN A1C: 6.9 % — AB (ref 4.0–6.0)

## 2014-11-17 LAB — GLUCOSE, POCT (MANUAL RESULT ENTRY): POC Glucose: 104 mg/dl — AB (ref 70–99)

## 2014-11-17 MED ORDER — FLUCONAZOLE 150 MG PO TABS: 150.0000 mg | ORAL_TABLET | Freq: Once | ORAL | Status: DC

## 2014-11-17 NOTE — Progress Notes (Signed)
Subjective:    Patient ID: Kelly Hunter, female    DOB: November 24, 1990, 24 y.o.   MRN: [id number]  11/17/2014  follow up sleep medication   HPI This 24 y.o. female presents for two month follow-up:  1. Insomnia: has only taken Lunesta 3mg  qhs once since last visit.  Never has time to take medication; when took one tablet but still had difficulty falling asleep.  No counseling; $150 upfront.  Dog really helpful; black lab puppy ten months old.  Still at [employer]; wants to leave; wants to find a better job.  Wants to go to school.  Wants to go to original school but might go to community college for respiratory therapy.  Living with mom; worries about mom's health a lot.  Mom with pinched nerve.  Was dating someone; relationship ended.  Boyfriend is in army; reconnected; then started dating while he was in [place].  When he came back to [place], started dating again.  Then moved to [place], became mean.  Talking about marriage.   2. Hyperglycemia: glucose 129 on labs last week.   B: water Lunch: chicken fried sandwich, fruit punch, fries.  3:00pm or oatmeal Supper:  Lacinda Axon out (chicken sandwich grilled with cheese, bacon, tomatoe, corn dog, nuggets, fruit punch).  10:00pm. Or cereal or [employer] food.  Pancakes with syrup.   Drinks a lot of sodas at work.  Mt. Lucky Cowboy.   Not exercising currently.    3.  Eczema:  Continues to suffer with rash along forearms and axilla.  Wants referral to a new dermatologist.    Review of Systems  Constitutional: Negative for fever, chills, diaphoresis and fatigue.  Eyes: Negative for visual disturbance.  Respiratory: Negative for cough and shortness of breath.   Cardiovascular: Negative for chest pain, palpitations and leg swelling.  Gastrointestinal: Negative for nausea, vomiting, abdominal pain, diarrhea and constipation.  Endocrine: Negative for cold intolerance, heat intolerance, polydipsia, polyphagia and polyuria.  Skin: Positive for rash.  Neurological: Negative  for dizziness, tremors, seizures, syncope, facial asymmetry, speech difficulty, weakness, light-headedness, numbness and headaches.  Psychiatric/Behavioral: Positive for sleep disturbance. Negative for suicidal ideas, self-injury and dysphoric mood. The patient is not nervous/anxious.     Past Medical History  Diagnosis Date  . Allergy     Allegra  . Asthma     mild; onset in childhood.  No hospitalizations.  . Eczema   . Acne   . Hidradenitis   . CIN I (cervical intraepithelial neoplasia I) 08/31/2013    colposcopy by Toney Rakes; s/p CO2 laser treatment.  Marland Kitchen VIN I (vulvar intraepithelial neoplasia I) 08/31/2013    s/p CO2 treatment; [facility]/gyn.  . Genital warts 08/31/2013    Aldara treatment; Toney Rakes.  . Chlamydia 03/03/2012  . HIV (human immunodeficiency virus infection) ([place])   . Substance abuse   . Hidradenitis axillaris 05/21/2014  . Type 2 diabetes mellitus without complication, without long-term current use of insulin ([place]) 11/17/2014   Past Surgical History  Procedure Laterality Date  . Breast surgery  02/01/2008    Reduction  . Co2 laser application N/A 02/06/2692    Procedure: C02 laser of vagina and cervix;  Surgeon: Terrance Mass, MD;  Location: [facility];  Service: Gynecology;  Laterality: N/A;   Allergies  Allergen Reactions  . Other Nausea And Vomiting and Swelling    Tree Nuts (Almonds)  . Peanut-Containing Drug Products     Social History   Social History  . Marital Status: Single    Spouse  Name: n/a  . Number of Children: 0  . Years of Education: N/A   Occupational History  . Manager     [employer]   Social History Main Topics  . Smoking status: Never Smoker   . Smokeless tobacco: Never Used  . Alcohol Use: No     Comment: previously used socially  . Drug Use: No  . Sexual Activity:    Partners: Male    Birth Control/ Protection: Pill     Comment: 4 total sexual partners; history of Chlamydia in 03/2012. Genital warts 08/31/2013.   Other Topics Concern    . Not on file   Social History Narrative   Marital status: single;  Not dating in 2016.      Children: none; never pregnant.      Lives: with mom; dad in [place].     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      Education: previously in school; took medical leave in 02/2012; [employer] in [place].      Tobacco: vapor cigarette two months. Pt does not smoke.       Alcohol:  Socially; weekends; no DUIs.      Drugs: marijuana daily.  Started at age 45.        Sexual activity: sexually active; 25 total partners; males; Chlamydia in 03/2012.  Genital warts 08/2013.  HIV diagnosis 12/2013.      Seatbelt: 100%      Guns:  none   Family History  Problem Relation Age of Onset  . Diabetes Mother   . Hypertension Mother   . Hyperlipidemia Mother   . Mental illness Mother   . Cancer Maternal Grandfather     ?       Objective:    BP 136/70 mmHg  Pulse 83  Temp(Src) 98.9 F (37.2 C) (Oral)  Resp 16  Wt 246 lb 9.6 oz (111.857 kg)  LMP 10/19/2014 Physical Exam  Constitutional: She is oriented to person, place, and time. She appears well-developed and well-nourished. No distress.  HENT:  Head: Normocephalic and atraumatic.  Right Ear: External ear normal.  Left Ear: External ear normal.  Nose: Nose normal.  Mouth/Throat: Oropharynx is clear and moist.  Eyes: Conjunctivae and EOM are normal. Pupils are equal, round, and reactive to light.  Neck: Normal range of motion. Neck supple. Carotid bruit is not present. No thyromegaly present.  Cardiovascular: Normal rate, regular rhythm, normal heart sounds and intact distal pulses.  Exam reveals no gallop and no friction rub.   No murmur heard. Pulmonary/Chest: Effort normal and breath sounds normal. She has no wheezes. She has no rales.  Abdominal: Soft. Bowel sounds are normal. She exhibits no distension and no mass. There is no tenderness. There is no rebound and no guarding.  Lymphadenopathy:     She has no cervical adenopathy.  Neurological: She is alert and oriented to person, place, and time. No cranial nerve deficit.  Skin: Skin is warm and dry. Rash noted. She is not diaphoretic. No erythema. No pallor.  Scattered maculopapular rash on B arms.  Psychiatric: She has a normal mood and affect. Her behavior is normal.   Results for orders placed or performed in visit on 11/17/14  POCT glycosylated hemoglobin (Hb A1C)  Result Value Ref Range   Hemoglobin A1C 6.9   POCT glucose (manual entry)  Result Value Ref Range   POC Glucose 104 (A) 70 - 99 mg/dl  Assessment & Plan:   1. Type 2 diabetes mellitus without complication, without long-term current use of insulin ([place])   2. Hyperglycemia   3. Insomnia   4. Eczema   5. Need for prophylactic vaccination and inoculation against viral hepatitis   6. Need for prophylactic vaccination and inoculation against influenza   7. Candidiasis of genitalia in female   8. Asymptomatic HIV infection ([place])     1. DMII: New. Counseling provided during visit; recommend weight loss, exercise, and low-carbohydrate food choices.  RTC three months for follow-up. 2.  Insomnia:  Uncontrolled with Lunesta 3mg  qhs yet has only taken once since last visit; can add Trazodone 50mg  qhs. 3.  Eczema: Persistent; refer to dermatology. 4.  Candidiasis vulvovaginal: New/recurrent; rx for Diflucan provided. 5.  S/p influenza vaccine and Hepatitis a#2. 6. HIV; stable; followed by ID closely.   Orders Placed This Encounter  Procedures  . Flu Vaccine QUAD 36+ mos IM  . Hepatitis A vaccine adult IM  . Ambulatory referral to Dermatology    Referral Priority:  Routine    Referral Type:  Consultation    Referral Reason:  Specialty Services Required    Requested Specialty:  Dermatology    Number of Visits Requested:  1  . POCT glycosylated hemoglobin (Hb A1C)  . POCT glucose (manual entry)   Meds ordered this encounter  Medications  . fluconazole  (DIFLUCAN) 150 MG tablet    Sig: Take 1 tablet (150 mg total) by mouth once. Repeat if needed    Dispense:  2 tablet    Refill:  0    Return in about 3 months (around 02/17/2015) for recheck diabetes.    Merrin Mcvicker Elayne Guerin, M.D. [facility] [address] [place],   [address] [phone] phone [phone] fax

## 2014-11-17 NOTE — Patient Instructions (Addendum)
[email or web address]   Basic Carbohydrate Counting for Diabetes Mellitus Carbohydrate counting is a method for keeping track of the amount of carbohydrates you eat. Eating carbohydrates naturally increases the level of sugar (glucose) in your blood, so it is important for you to know the amount that is okay for you to have in every meal. Carbohydrate counting helps keep the level of glucose in your blood within normal limits. The amount of carbohydrates allowed is different for every person. A dietitian can help you calculate the amount that is right for you. Once you know the amount of carbohydrates you can have, you can count the carbohydrates in the foods you want to eat. Carbohydrates are found in the following foods:  Grains, such as breads and cereals.  Dried beans and soy products.  Starchy vegetables, such as potatoes, peas, and corn.  Fruit and fruit juices.  Milk and yogurt.  Sweets and snack foods, such as cake, cookies, candy, chips, soft drinks, and fruit drinks. CARBOHYDRATE COUNTING There are two ways to count the carbohydrates in your food. You can use either of the methods or a combination of both. Reading the "Nutrition Facts" on [place] The "Nutrition Facts" is an area that is included on the labels of almost all packaged food and beverages in the [place]. It includes the serving size of that food or beverage and information about the nutrients in each serving of the food, including the grams (g) of carbohydrate per serving.  Decide the number of servings of this food or beverage that you will be able to eat or drink. Multiply that number of servings by the number of grams of carbohydrate that is listed on the label for that serving. The total will be the amount of carbohydrates you will be having when you eat or drink this food or beverage. Learning Standard Serving Sizes of Food When you eat food that is not packaged or does not include "Nutrition Facts" on the  label, you need to measure the servings in order to count the amount of carbohydrates.A serving of most carbohydrate-rich foods contains about 15 g of carbohydrates. The following list includes serving sizes of carbohydrate-rich foods that provide 15 g ofcarbohydrate per serving:   1 slice of bread (1 oz) or 1 six-inch tortilla.    of a hamburger bun or English muffin.  4-6 crackers.   cup unsweetened dry cereal.    cup hot cereal.   cup rice or pasta.    cup mashed potatoes or  of a large baked potato.  1 cup fresh fruit or one small piece of fruit.    cup canned or frozen fruit or fruit juice.  1 cup milk.   cup plain fat-free yogurt or yogurt sweetened with artificial sweeteners.   cup cooked dried beans or starchy vegetable, such as peas, corn, or potatoes.  Decide the number of standard-size servings that you will eat. Multiply that number of servings by 15 (the grams of carbohydrates in that serving). For example, if you eat 2 cups of strawberries, you will have eaten 2 servings and 30 g of carbohydrates (2 servings x 15 g = 30 g). For foods such as soups and casseroles, in which more than one food is mixed in, you will need to count the carbohydrates in each food that is included. EXAMPLE OF CARBOHYDRATE COUNTING Sample Dinner  3 oz chicken breast.   cup of brown rice.   cup of corn.  1 cup milk.   1  cup strawberries with sugar-free whipped topping.  Carbohydrate Calculation Step 1: Identify the foods that contain carbohydrates:   Rice.   Corn.   Milk.   Strawberries. Step 2:Calculate the number of servings eaten of each:   2 servings of rice.   1 serving of corn.   1 serving of milk.   1 serving of strawberries. Step 3: Multiply each of those number of servings by 15 g:   2 servings of rice x 15 g = 30 g.   1 serving of corn x 15 g = 15 g.   1 serving of milk x 15 g = 15 g.   1 serving of strawberries x 15 g = 15  g. Step 4: Add together all of the amounts to find the total grams of carbohydrates eaten: 30 g + 15 g + 15 g + 15 g = 75 g.   This information is not intended to replace advice given to you by your health care provider. Make sure you discuss any questions you have with your health care provider.   Document Released: 01/17/2005 Document Revised: 02/07/2014 Document Reviewed: 12/14/2012 Elsevier Interactive Patient Education [employer].

## 2014-11-24 ENCOUNTER — Encounter: Payer: Self-pay | Admitting: Family Medicine

## 2014-11-25 ENCOUNTER — Telehealth: Payer: Self-pay

## 2014-11-25 NOTE — Telephone Encounter (Signed)
Patient states Dr. Tamala Julian was supposed to send in a glucose meter to her pharmacy but the pharmacy says they never received it. Preferred pharmacy is [facility] [address]. Can we send in script for glucose meter? Patient stated she didn't know which type of meter was being sent in. Cb# [phone].

## 2014-11-26 ENCOUNTER — Ambulatory Visit: Payer: 59 | Admitting: Infectious Disease

## 2014-11-26 MED ORDER — BLOOD GLUCOSE MONITOR KIT: PACK | Status: DC

## 2014-11-26 NOTE — Telephone Encounter (Signed)
Glucose meter sent in with supplies.

## 2015-01-05 ENCOUNTER — Ambulatory Visit: Payer: 59 | Admitting: Infectious Disease

## 2015-01-12 ENCOUNTER — Ambulatory Visit: Payer: 59 | Admitting: Infectious Disease

## 2015-01-13 ENCOUNTER — Encounter: Payer: Self-pay | Admitting: Infectious Disease

## 2015-01-13 ENCOUNTER — Ambulatory Visit (INDEPENDENT_AMBULATORY_CARE_PROVIDER_SITE_OTHER): Payer: 59 | Admitting: Infectious Disease

## 2015-01-13 VITALS — BP 127/86 | HR 88 | Temp 97.9°F | Ht 64.0 in | Wt 245.0 lb

## 2015-01-13 DIAGNOSIS — R896 Abnormal cytological findings in specimens from other organs, systems and tissues: Secondary | ICD-10-CM

## 2015-01-13 DIAGNOSIS — IMO0002 Reserved for concepts with insufficient information to code with codable children: Secondary | ICD-10-CM

## 2015-01-13 DIAGNOSIS — E119 Type 2 diabetes mellitus without complications: Secondary | ICD-10-CM

## 2015-01-13 DIAGNOSIS — B20 Human immunodeficiency virus [HIV] disease: Secondary | ICD-10-CM

## 2015-01-13 DIAGNOSIS — L732 Hidradenitis suppurativa: Secondary | ICD-10-CM

## 2015-01-13 DIAGNOSIS — E669 Obesity, unspecified: Secondary | ICD-10-CM

## 2015-01-13 MED ORDER — ABACAVIR-DOLUTEGRAVIR-LAMIVUD 600-50-300 MG PO TABS: 1.0000 | ORAL_TABLET | Freq: Every day | ORAL | Status: DC

## 2015-01-13 NOTE — Progress Notes (Signed)
Subjective:    Patient ID: Kelly Hunter, female    DOB: 17-Apr-1990, 24 y.o.   MRN: [id number]  HPI   24 year old African-American lady with HIV disease. On [place]. She is still taking this religiously.  She is still overweight and her A1c is at 6.9. Primary care has advised weight loss. SHe has stellar HLD and nice LDL  BP normotensive. She is up to date on vaccines and following gynecology for her hx of ASCUS.      Lab Results  Component Value Date   HIV1RNAQUANT 26* 11/12/2014     Her CD4 is  Lab Results  Component Value Date   CD4TABS 690 11/12/2014   CD4TABS 510 05/21/2014   CD4TABS 480 02/12/2014   Past Medical History  Diagnosis Date  . Allergy     Allegra  . Asthma     mild; onset in childhood.  No hospitalizations.  . Eczema   . Acne   . Hidradenitis   . CIN I (cervical intraepithelial neoplasia I) 08/31/2013    colposcopy by Toney Rakes; s/p CO2 laser treatment.  Marland Kitchen VIN I (vulvar intraepithelial neoplasia I) 08/31/2013    s/p CO2 treatment; [facility]/gyn.  . Genital warts 08/31/2013    Aldara treatment; Toney Rakes.  . Chlamydia 03/03/2012  . HIV (human immunodeficiency virus infection) ([place])   . Substance abuse   . Hidradenitis axillaris 05/21/2014  . Type 2 diabetes mellitus without complication, without long-term current use of insulin ([place]) 11/17/2014    Past Surgical History  Procedure Laterality Date  . Breast surgery  02/01/2008    Reduction  . Co2 laser application N/A 08/10/6267    Procedure: C02 laser of vagina and cervix;  Surgeon: Terrance Mass, MD;  Location: [facility];  Service: Gynecology;  Laterality: N/A;    Family History  Problem Relation Age of Onset  . Diabetes Mother   . Hypertension Mother   . Hyperlipidemia Mother   . Mental illness Mother   . Cancer Maternal Grandfather     ?      Social History   Social History  . Marital Status: Single    Spouse Name: n/a  . Number of Children: 0  . Years of Education: N/A    Occupational History  . Manager     [employer]   Social History Main Topics  . Smoking status: Never Smoker   . Smokeless tobacco: Never Used  . Alcohol Use: No     Comment: previously used socially  . Drug Use: No  . Sexual Activity:    Partners: Male    Birth Control/ Protection: Pill     Comment: 4 total sexual partners; history of Chlamydia in 03/2012. Genital warts 08/31/2013.   Other Topics Concern  . None   Social History Narrative   Marital status: single;  Not dating in 2016.      Children: none; never pregnant.      Lives: with mom; dad in [place].     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      Education: previously in school; took medical leave in 02/2012; [employer] in [place].      Tobacco: vapor cigarette two months. Pt does not smoke.       Alcohol:  Socially; weekends; no DUIs.      Drugs: marijuana daily.  Started at age 11.        Sexual activity: sexually active; 25 total partners; males; Chlamydia  in 03/2012.  Genital warts 08/2013.  HIV diagnosis 12/2013.      Seatbelt: 100%      Guns:  none    Allergies  Allergen Reactions  . Other Nausea And Vomiting and Swelling    Tree Nuts (Almonds)  . Peanut-Containing Drug Products      Current outpatient prescriptions:  .  Abacavir-Dolutegravir-Lamivud (TRIUMEQ) 600-50-300 MG TABS, Take 1 tablet by mouth daily., Disp: 30 tablet, Rfl: 11 .  blood glucose meter kit and supplies KIT, Dispense based on patient and insurance preference. Use up to three times daily as directed. (FOR ICD-10 E11.9), Disp: 1 each, Rfl: 0 .  desogestrel-ethinyl estradiol (APRI,EMOQUETTE,SOLIA) 0.15-30 MG-MCG tablet, Take 1 tablet by mouth daily., Disp: 2 Package, Rfl: 7 .  doxycycline (DORYX) 100 MG EC tablet, Take 100 mg by mouth 2 (two) times daily., Disp: , Rfl:  .  Eszopiclone 3 MG TABS, Take 1 tablet (3 mg total) by mouth at bedtime. Take immediately before bedtime, Disp: 30 tablet, Rfl: 5 .   fluconazole (DIFLUCAN) 150 MG tablet, Take 1 tablet (150 mg total) by mouth once. Repeat if needed, Disp: 2 tablet, Rfl: 0 .  imiquimod (ALDARA) 5 % cream, Apply topically 3 (three) times a week., Disp: 12 each, Rfl: 0 .  traZODone (DESYREL) 50 MG tablet, Take 1-2 tablets (50-100 mg total) by mouth at bedtime as needed for sleep., Disp: 60 tablet, Rfl: 3 .  Triamcinolone Acetonide (TRIAMCINOLONE 0.1 % CREAM : EUCERIN) CREA, Use as directed, Disp: 480 each, Rfl: 11   Review of Systems  Constitutional: Negative for fever, chills, diaphoresis, activity change, appetite change, fatigue and unexpected weight change.  HENT: Negative for congestion, rhinorrhea, sinus pressure, sneezing, sore throat and trouble swallowing.   Eyes: Negative for photophobia and visual disturbance.  Respiratory: Negative for cough, chest tightness, shortness of breath, wheezing and stridor.   Cardiovascular: Negative for chest pain, palpitations and leg swelling.  Gastrointestinal: Negative for nausea, vomiting, abdominal pain, diarrhea, constipation, blood in stool, abdominal distention and anal bleeding.  Genitourinary: Negative for dysuria, hematuria, flank pain and difficulty urinating.  Musculoskeletal: Negative for myalgias, back pain, joint swelling, arthralgias and gait problem.  Skin: Negative for color change, pallor, rash and wound.  Neurological: Negative for dizziness, tremors, weakness and light-headedness.  Hematological: Negative for adenopathy. Does not bruise/bleed easily.  Psychiatric/Behavioral: Negative for behavioral problems, confusion, sleep disturbance, dysphoric mood, decreased concentration and agitation.       Objective:   Physical Exam  Constitutional: She is oriented to person, place, and time. She appears well-developed and well-nourished. No distress.  HENT:  Head: Normocephalic and atraumatic.  Mouth/Throat: No oropharyngeal exudate.  Eyes: Conjunctivae and EOM are normal. No scleral  icterus.  Neck: Normal range of motion. Neck supple.  Cardiovascular: Normal rate and regular rhythm.   Pulmonary/Chest: Effort normal. No respiratory distress. She has no wheezes.  Abdominal: She exhibits no distension.  Musculoskeletal: She exhibits no edema or tenderness.  Neurological: She is alert and oriented to person, place, and time. She exhibits normal muscle tone. Coordination normal.  Skin: Skin is warm and dry. No rash noted. She is not diaphoretic. No erythema. No pallor.  Psychiatric: She has a normal mood and affect. Her behavior is normal. Judgment and thought content normal.          Assessment & Plan:  HIV: continue TRIUMEQ and recheck in 6 months. I have recommended that she consider becoming a PEER SUPPORT patient as she is such  a model patient. First meeting is this Friday  Hx of ASCUS: will need regular pap smears and close followup. Also had HPV + of vulva  Hidradenitis: I would NOT recommend chronic doxy if she has this but rather judicious use of abx  DM: IF she starts metformin would want PCP to keep in mind the DTG in [place] ELEVATE levels of metfomin so just need to proceed cautiously with dose to avoid Metformin toxicity.  I spent greater than 25 minutes with the patient including greater than 50% of time in face to face counsel of the patient re her HIV, HS, DM, obesity, ASCUS and in coordination of their care.

## 2015-02-20 ENCOUNTER — Other Ambulatory Visit: Payer: Self-pay | Admitting: Infectious Disease

## 2015-02-20 ENCOUNTER — Ambulatory Visit: Payer: 59 | Admitting: Family Medicine

## 2015-04-29 ENCOUNTER — Ambulatory Visit (INDEPENDENT_AMBULATORY_CARE_PROVIDER_SITE_OTHER): Payer: 59 | Admitting: Family Medicine

## 2015-04-29 ENCOUNTER — Ambulatory Visit (INDEPENDENT_AMBULATORY_CARE_PROVIDER_SITE_OTHER): Payer: 59

## 2015-04-29 VITALS — BP 126/82 | HR 77 | Temp 97.1°F | Resp 17 | Ht 65.0 in | Wt 237.0 lb

## 2015-04-29 DIAGNOSIS — A630 Anogenital (venereal) warts: Secondary | ICD-10-CM | POA: Diagnosis not present

## 2015-04-29 DIAGNOSIS — J988 Other specified respiratory disorders: Secondary | ICD-10-CM

## 2015-04-29 DIAGNOSIS — E119 Type 2 diabetes mellitus without complications: Secondary | ICD-10-CM

## 2015-04-29 DIAGNOSIS — Z3202 Encounter for pregnancy test, result negative: Secondary | ICD-10-CM

## 2015-04-29 DIAGNOSIS — J22 Unspecified acute lower respiratory infection: Secondary | ICD-10-CM

## 2015-04-29 DIAGNOSIS — B20 Human immunodeficiency virus [HIV] disease: Secondary | ICD-10-CM | POA: Diagnosis not present

## 2015-04-29 LAB — POCT GLYCOSYLATED HEMOGLOBIN (HGB A1C): Hemoglobin A1C: 6.5

## 2015-04-29 LAB — POCT URINE PREGNANCY: PREG TEST UR: NEGATIVE

## 2015-04-29 LAB — GLUCOSE, POCT (MANUAL RESULT ENTRY): POC GLUCOSE: 93 mg/dL (ref 70–99)

## 2015-04-29 MED ORDER — AMOXICILLIN-POT CLAVULANATE 875-125 MG PO TABS: 1.0000 | ORAL_TABLET | Freq: Two times a day (BID) | ORAL | Status: DC

## 2015-04-29 MED ORDER — ALBUTEROL SULFATE HFA 108 (90 BASE) MCG/ACT IN AERS: 2.0000 | INHALATION_SPRAY | Freq: Four times a day (QID) | RESPIRATORY_TRACT | Status: DC | PRN

## 2015-04-29 MED ORDER — IMIQUIMOD 5 % EX CREA: TOPICAL_CREAM | CUTANEOUS | Status: DC

## 2015-04-29 NOTE — Patient Instructions (Addendum)
   IF you received an x-ray today, you will receive an invoice from [facility]. Please contact [facility] at [phone] with questions or concerns regarding your invoice.   IF you received labwork today, you will receive an invoice from [facility]. Please contact [facility] at [phone] with questions or concerns regarding your invoice.   Our billing staff will not be able to assist you with questions regarding bills from these companies.  You will be contacted with the lab results as soon as they are available. The fastest way to get your results is to activate your My Chart account. Instructions are located on the last page of this paperwork. If you have not heard from us regarding the results in 2 weeks, please contact this office.     Diabetes and Exercise Exercising regularly is important. It is not just about losing weight. It has many health benefits, such as:  Improving your overall fitness, flexibility, and endurance.  Increasing your bone density.  Helping with weight control.  Decreasing your body fat.  Increasing your muscle strength.  Reducing stress and tension.  Improving your overall health. People with diabetes who exercise gain additional benefits because exercise:  Reduces appetite.  Improves the body's use of blood sugar (glucose).  Helps lower or control blood glucose.  Decreases blood pressure.  Helps control blood lipids (such as cholesterol and triglycerides).  Improves the body's use of the hormone insulin by:  Increasing the body's insulin sensitivity.  Reducing the body's insulin needs.  Decreases the risk for heart disease because exercising:  Lowers cholesterol and triglycerides levels.  Increases the levels of good cholesterol (such as high-density lipoproteins [HDL]) in the body.  Lowers blood glucose levels. YOUR ACTIVITY PLAN  Choose an activity that you enjoy, and set realistic  goals. To exercise safely, you should begin practicing any new physical activity slowly, and gradually increase the intensity of the exercise over time. Your health care provider or diabetes educator can help create an activity plan that works for you. General recommendations include:  Encouraging children to engage in at least 60 minutes of physical activity each day.  Stretching and performing strength training exercises, such as yoga or weight lifting, at least 2 times per week.  Performing a total of at least 150 minutes of moderate-intensity exercise each week, such as brisk walking or water aerobics.  Exercising at least 3 days per week, making sure you allow no more than 2 consecutive days to pass without exercising.  Avoiding long periods of inactivity (90 minutes or more). When you have to spend an extended period of time sitting down, take frequent breaks to walk or stretch. RECOMMENDATIONS FOR EXERCISING WITH TYPE 1 OR TYPE 2 DIABETES   Check your blood glucose before exercising. If blood glucose levels are greater than 240 mg/dL, check for urine ketones. Do not exercise if ketones are present.  Avoid injecting insulin into areas of the body that are going to be exercised. For example, avoid injecting insulin into:  The arms when playing tennis.  The legs when jogging.  Keep a record of:  Food intake before and after you exercise.  Expected peak times of insulin action.  Blood glucose levels before and after you exercise.  The type and amount of exercise you have done.  Review your records with your health care provider. Your health care provider will help you to develop guidelines for adjusting food intake and insulin amounts before and after exercising.    If you take insulin or oral hypoglycemic agents, watch for signs and symptoms of hypoglycemia. They include:  Dizziness.  Shaking.  Sweating.  Chills.  Confusion.  Drink plenty of water while you exercise to  prevent dehydration or heat stroke. Body water is lost during exercise and must be replaced.  Talk to your health care provider before starting an exercise program to make sure it is safe for you. Remember, almost any type of activity is better than none.   This information is not intended to replace advice given to you by your health care provider. Make sure you discuss any questions you have with your health care provider.   Document Released: 04/09/2003 Document Revised: 06/03/2014 Document Reviewed: 06/26/2012 Elsevier Interactive Patient Education 2016 Elsevier Inc.  

## 2015-04-29 NOTE — Progress Notes (Signed)
Subjective:    Patient ID: Kelly Hunter, female    DOB: 1991-01-25, 25 y.o.   MRN: [id number]  04/29/2015  Nasal Congestion   HPI This 25 y.o. female presents for evaluation of head and chest congestion.  ONset two weeks ago.  Taking Mucinex, Allergy Sinus, Decongestant.  No fever/chills/sweats.  +HA but due to lack of sleep.  NO ear pain.  No ST.  +Rhinorrhea yellow and then clear.  +nasal congestion mild.  +mostly chest congestion; +wheezing.  +sputum yellow-white.  No SOB.  No inhaler; history of childhood asthma.  Last use Albuterol 2005.  No v/d.  Working at [employer].    Viral load up in 11/2014; last visit in 01/2015.    DMII: has lost weight little bit; has not gotten glucometer.  Cannot prick finger; lets mother check sugar.   Has cut out sweets at work.  Did not grow up eating sweets.  Sex 03-20-2015; LMP 04-07-2015 WNL. Would like STD screening.  Takes Doxy PRN hidradenitis. Genital herpes is an ongoing issue; uses Aldara regularly; needs refill.   Review of Systems  Constitutional: Negative for fever, chills, diaphoresis and fatigue.  HENT: Positive for congestion, postnasal drip and rhinorrhea. Negative for ear pain, sinus pressure and sore throat.   Eyes: Negative for visual disturbance.  Respiratory: Positive for cough and wheezing. Negative for shortness of breath.   Cardiovascular: Negative for chest pain, palpitations and leg swelling.  Gastrointestinal: Negative for nausea, vomiting, abdominal pain, diarrhea and constipation.  Endocrine: Negative for cold intolerance, heat intolerance, polydipsia, polyphagia and polyuria.  Genitourinary: Positive for genital sores.  Neurological: Positive for headaches. Negative for dizziness, tremors, seizures, syncope, facial asymmetry, speech difficulty, weakness, light-headedness and numbness.    Past Medical History  Diagnosis Date  . Allergy     Allegra  . Asthma     mild; onset in childhood.  No hospitalizations.  .  Eczema   . Acne   . Hidradenitis   . CIN I (cervical intraepithelial neoplasia I) 08/31/2013    colposcopy by Toney Rakes; s/p CO2 laser treatment.  Marland Kitchen VIN I (vulvar intraepithelial neoplasia I) 08/31/2013    s/p CO2 treatment; [facility]/gyn.  . Genital warts 08/31/2013    Aldara treatment; Toney Rakes.  . Chlamydia 03/03/2012  . HIV (human immunodeficiency virus infection) ([place])   . Substance abuse   . Hidradenitis axillaris 05/21/2014  . Type 2 diabetes mellitus without complication, without long-term current use of insulin ([place]) 11/17/2014   Past Surgical History  Procedure Laterality Date  . Breast surgery  02/01/2008    Reduction  . Co2 laser application N/A 4/59/9774    Procedure: C02 laser of vagina and cervix;  Surgeon: Terrance Mass, MD;  Location: [facility];  Service: Gynecology;  Laterality: N/A;   Allergies  Allergen Reactions  . Other Nausea And Vomiting and Swelling    Tree Nuts (Almonds)  . Peanut-Containing Drug Products    Current Outpatient Prescriptions  Medication Sig Dispense Refill  . abacavir-dolutegravir-lamiVUDine (TRIUMEQ) 600-50-300 MG tablet Take 1 tablet by mouth daily. 30 tablet 11  . blood glucose meter kit and supplies KIT Dispense based on patient and insurance preference. Use up to three times daily as directed. (FOR ICD-10 E11.9) 1 each 0  . doxycycline (DORYX) 100 MG EC tablet Take 100 mg by mouth 2 (two) times daily.    . imiquimod (ALDARA) 5 % cream Apply topically 3 (three) times a week. 12 each 1  . Triamcinolone Acetonide (TRIAMCINOLONE 0.1 %  CREAM : EUCERIN) CREA Use as directed 480 each 11  . TRIUMEQ 600-50-300 MG tablet TAKE 1 TABLET BY MOUTH DAILY. 30 tablet 4   No current facility-administered medications for this visit.   Social History   Social History  . Marital Status: Single    Spouse Name: n/a  . Number of Children: 0  . Years of Education: N/A   Occupational History  . Manager     [employer]   Social History Main Topics  . Smoking  status: Never Smoker   . Smokeless tobacco: Never Used  . Alcohol Use: No     Comment: previously used socially  . Drug Use: No  . Sexual Activity:    Partners: Male    Birth Control/ Protection: Pill     Comment: 4 total sexual partners; history of Chlamydia in 03/2012. Genital warts 08/31/2013.   Other Topics Concern  . Not on file   Social History Narrative   Marital status: single;  Not dating in 2016.      Children: none; never pregnant.      Lives: with mom; dad in [place].     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      Education: previously in school; took medical leave in 02/2012; [employer] in [place].      Tobacco: vapor cigarette two months. Pt does not smoke.       Alcohol:  Socially; weekends; no DUIs.      Drugs: marijuana daily.  Started at age 33.        Sexual activity: sexually active; 25 total partners; males; Chlamydia in 03/2012.  Genital warts 08/2013.  HIV diagnosis 12/2013.      Seatbelt: 100%      Guns:  none   Family History  Problem Relation Age of Onset  . Diabetes Mother   . Hypertension Mother   . Hyperlipidemia Mother   . Mental illness Mother   . Cancer Maternal Grandfather     ?       Objective:    BP 126/82 mmHg  Pulse 77  Temp(Src) 97.1 F (36.2 C) (Oral)  Resp 17  Ht _0  (1.651 m)  Wt 237 lb (107.502 kg)  BMI 39.44 kg/m2  SpO2 97%  LMP 04/07/2015 Physical Exam  Constitutional: She is oriented to person, place, and time. She appears well-developed and well-nourished. No distress.  HENT:  Head: Normocephalic and atraumatic.  Right Ear: Tympanic membrane, external ear and ear canal normal.  Left Ear: Tympanic membrane, external ear and ear canal normal.  Nose: Mucosal edema and rhinorrhea present. Right sinus exhibits no maxillary sinus tenderness and no frontal sinus tenderness. Left sinus exhibits no maxillary sinus tenderness and no frontal sinus tenderness.  Mouth/Throat: Uvula is  midline, oropharynx is clear and moist and mucous membranes are normal.  Eyes: Conjunctivae and EOM are normal. Pupils are equal, round, and reactive to light.  Neck: Normal range of motion. Neck supple. Carotid bruit is not present. No thyromegaly present.  Cardiovascular: Normal rate, regular rhythm, normal heart sounds and intact distal pulses.  Exam reveals no gallop and no friction rub.   No murmur heard. Pulmonary/Chest: Effort normal and breath sounds normal. She has no wheezes. She has no rales.  Abdominal: Soft. Bowel sounds are normal. She exhibits no distension and no mass. There is no tenderness. There is no rebound and no guarding.  Lymphadenopathy:    She has no cervical  adenopathy.  Neurological: She is alert and oriented to person, place, and time. No cranial nerve deficit.  Skin: Skin is warm and dry. No rash noted. She is not diaphoretic. No erythema. No pallor.  Psychiatric: She has a normal mood and affect. Her behavior is normal.   Results for orders placed or performed in visit on 04/29/15  POCT glucose (manual entry)  Result Value Ref Range   POC Glucose 93 70 - 99 mg/dl  POCT glycosylated hemoglobin (Hb A1C)  Result Value Ref Range   Hemoglobin A1C 6.5   POCT urine pregnancy  Result Value Ref Range   Preg Test, Ur Negative Negative   Dg Chest 2 View  04/29/2015  CLINICAL DATA:  Head and chest congestion beginning 2 weeks ago, lower respiratory infection, HIV positive, type II diabetes mellitus, asthma EXAM: CHEST  2 VIEW COMPARISON:  None FINDINGS: Normal heart size, mediastinal contours, and pulmonary vascularity. Lungs clear. No pleural effusion or pneumothorax. Bones unremarkable. IMPRESSION: Normal exam. Electronically Signed   By: Lavonia Dana M.D.   On: 04/29/2015 17:43      Assessment & Plan:   1. Type 2 diabetes mellitus without complication, without long-term current use of insulin ([place])   2. Lower respiratory infection   3. HIV disease ([place])   4.  Condyloma acuminatum of vulva    1. DMII: improved with dietary modification.  Continue with weight loss. 2.  Lower respiratory infection:  New.  Augmentin, Albuterol; continue Mucinex. 3.  HIV: controlled; followed by ID; compliance with medication. 4.  Condyloma vulva: persistent; refilled Aldara.   Orders Placed This Encounter  Procedures  . GC/Chlamydia Probe Amp    Order Specific Question:  Source    Answer:  urine  . DG Chest 2 View    Standing Status: Future     Number of Occurrences: 1     Standing Expiration Date: 04/28/2016    Order Specific Question:  Reason for Exam (SYMPTOM  OR DIAGNOSIS REQUIRED)    Answer:  cough for two weeks.  HIV+ maintained on medicine with compliance    Order Specific Question:  Is the patient pregnant?    Answer:  No    Order Specific Question:  Preferred imaging location?    Answer:  External  . POCT glucose (manual entry)  . POCT glycosylated hemoglobin (Hb A1C)  . POCT urine pregnancy   Meds ordered this encounter  Medications  . imiquimod (ALDARA) 5 % cream    Sig: Apply topically 3 (three) times a week.    Dispense:  12 each    Refill:  1    No Follow-up on file.    Kelly Hunter Elayne Guerin, M.D. [facility] [address] [place], [place]  [address] [phone] phone [phone] fax

## 2015-04-30 LAB — GC/CHLAMYDIA PROBE AMP
CT Probe RNA: NOT DETECTED
GC PROBE AMP APTIMA: NOT DETECTED

## 2015-06-23 ENCOUNTER — Other Ambulatory Visit: Payer: 59

## 2015-07-01 ENCOUNTER — Other Ambulatory Visit: Payer: 59

## 2015-07-08 ENCOUNTER — Ambulatory Visit: Payer: 59 | Admitting: Infectious Disease

## 2015-07-15 ENCOUNTER — Ambulatory Visit: Payer: 59 | Admitting: Infectious Disease

## 2015-07-27 ENCOUNTER — Other Ambulatory Visit: Payer: 59

## 2015-07-27 DIAGNOSIS — B20 Human immunodeficiency virus [HIV] disease: Secondary | ICD-10-CM

## 2015-07-27 LAB — CBC WITH DIFFERENTIAL/PLATELET
BASOS PCT: 0 %
Basophils Absolute: 0 cells/uL (ref 0–200)
EOS ABS: 73 {cells}/uL (ref 15–500)
Eosinophils Relative: 1 %
HEMATOCRIT: 35.6 % (ref 35.0–45.0)
Hemoglobin: 12 g/dL (ref 11.7–15.5)
LYMPHS PCT: 27 %
Lymphs Abs: 1971 cells/uL (ref 850–3900)
MCH: 28 pg (ref 27.0–33.0)
MCHC: 33.7 g/dL (ref 32.0–36.0)
MCV: 83.2 fL (ref 80.0–100.0)
MONO ABS: 365 {cells}/uL (ref 200–950)
MONOS PCT: 5 %
MPV: 9.4 fL (ref 7.5–12.5)
NEUTROS PCT: 67 %
Neutro Abs: 4891 cells/uL (ref 1500–7800)
PLATELETS: 408 10*3/uL — AB (ref 140–400)
RBC: 4.28 MIL/uL (ref 3.80–5.10)
RDW: 16.5 % — AB (ref 11.0–15.0)
WBC: 7.3 10*3/uL (ref 3.8–10.8)

## 2015-07-28 LAB — HIV-1 RNA QUANT-NO REFLEX-BLD: HIV 1 RNA Quant: <20 copies/mL (ref <20)

## 2015-07-28 LAB — COMPLETE METABOLIC PANEL WITH GFR
ALT: 12 U/L (ref 6–29)
AST: 12 U/L (ref 10–30)
Albumin: 4.1 g/dL (ref 3.6–5.1)
Alkaline Phosphatase: 42 U/L (ref 33–115)
BUN: 8 mg/dL (ref 7–25)
CALCIUM: 8.9 mg/dL (ref 8.6–10.2)
CHLORIDE: 105 mmol/L (ref 98–110)
CO2: 22 mmol/L (ref 20–31)
Creat: 0.71 mg/dL (ref 0.50–1.10)
GFR, Est Non African American: >89 mL/min (ref >=60)
Glucose, Bld: 119 mg/dL — ABNORMAL HIGH (ref 65–99)
POTASSIUM: 5 mmol/L (ref 3.5–5.3)
Sodium: 138 mmol/L (ref 135–146)
Total Bilirubin: 0.4 mg/dL (ref 0.2–1.2)
Total Protein: 7.1 g/dL (ref 6.1–8.1)

## 2015-07-28 LAB — LIPID PANEL
CHOL/HDL RATIO: 3.7 ratio (ref <=5.0)
Cholesterol: 144 mg/dL (ref 125–200)
HDL: 39 mg/dL — AB (ref >=46)
LDL CALC: 90 mg/dL (ref <130)
Triglycerides: 77 mg/dL (ref <150)
VLDL: 15 mg/dL (ref <30)

## 2015-07-28 LAB — URINE CYTOLOGY ANCILLARY ONLY
Chlamydia: NEGATIVE
Neisseria Gonorrhea: NEGATIVE

## 2015-07-28 LAB — RPR

## 2015-07-28 LAB — T-HELPER CELL (CD4) - (RCID CLINIC ONLY)
CD4 % Helper T Cell: 42 % (ref 33–55)
CD4 T Cell Abs: 870 /uL (ref 400–2700)

## 2015-08-10 ENCOUNTER — Encounter: Payer: Self-pay | Admitting: Infectious Disease

## 2015-08-10 ENCOUNTER — Ambulatory Visit (INDEPENDENT_AMBULATORY_CARE_PROVIDER_SITE_OTHER): Payer: 59 | Admitting: Infectious Disease

## 2015-08-10 VITALS — BP 121/80 | HR 99 | Temp 98.2°F | Ht 63.0 in | Wt 223.0 lb

## 2015-08-10 DIAGNOSIS — E669 Obesity, unspecified: Secondary | ICD-10-CM | POA: Diagnosis not present

## 2015-08-10 DIAGNOSIS — B20 Human immunodeficiency virus [HIV] disease: Secondary | ICD-10-CM

## 2015-08-10 DIAGNOSIS — IMO0002 Reserved for concepts with insufficient information to code with codable children: Secondary | ICD-10-CM

## 2015-08-10 DIAGNOSIS — E119 Type 2 diabetes mellitus without complications: Secondary | ICD-10-CM

## 2015-08-10 DIAGNOSIS — R896 Abnormal cytological findings in specimens from other organs, systems and tissues: Secondary | ICD-10-CM | POA: Diagnosis not present

## 2015-08-10 NOTE — Progress Notes (Signed)
Chief complaint: followup for HIV  Subjective:    Patient ID: Kelly Hunter, female    DOB: 1990/02/06, 25 y.o.   MRN: [id number]  HPI   25 year-old African-American lady with HIV disease with perfect virological suppression on TRIUMEQ   Her A1c has continued to drop with weight loss and her BP is nicely controlled.    Lab Results  Component Value Date   HIV1RNAQUANT <20 07/27/2015   HIV1RNAQUANT 26* 11/12/2014   HIV1RNAQUANT <20 05/27/2014     Her CD4 is  Lab Results  Component Value Date   CD4TABS 870 07/27/2015   CD4TABS 690 11/12/2014   CD4TABS 510 05/21/2014   Past Medical History  Diagnosis Date  . Allergy     Allegra  . Asthma     mild; onset in childhood.  No hospitalizations.  . Eczema   . Acne   . Hidradenitis   . CIN I (cervical intraepithelial neoplasia I) 08/31/2013    colposcopy by Toney Rakes; s/p CO2 laser treatment.  Marland Kitchen VIN I (vulvar intraepithelial neoplasia I) 08/31/2013    s/p CO2 treatment; [facility]/gyn.  . Genital warts 08/31/2013    Aldara treatment; Toney Rakes.  . Chlamydia 03/03/2012  . HIV (human immunodeficiency virus infection) ([place])   . Substance abuse   . Hidradenitis axillaris 05/21/2014  . Type 2 diabetes mellitus without complication, without long-term current use of insulin ([place]) 11/17/2014    Past Surgical History  Procedure Laterality Date  . Breast surgery  02/01/2008    Reduction  . Co2 laser application N/A 0/73/7106    Procedure: C02 laser of vagina and cervix;  Surgeon: Terrance Mass, MD;  Location: [facility];  Service: Gynecology;  Laterality: N/A;    Family History  Problem Relation Age of Onset  . Diabetes Mother   . Hypertension Mother   . Hyperlipidemia Mother   . Mental illness Mother   . Cancer Maternal Grandfather     ?      Social History   Social History  . Marital Status: Single    Spouse Name: n/a  . Number of Children: 0  . Years of Education: N/A   Occupational History  . Manager    [employer]   Social History Main Topics  . Smoking status: Never Smoker   . Smokeless tobacco: Never Used  . Alcohol Use: No     Comment: previously used socially  . Drug Use: No  . Sexual Activity:    Partners: Male    Birth Control/ Protection: Pill     Comment: 4 total sexual partners; history of Chlamydia in 03/2012. Genital warts 08/31/2013.   Other Topics Concern  . Not on file   Social History Narrative   Marital status: single;  Not dating in 2016.      Children: none; never pregnant.      Lives: with mom; dad in [place].     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      Education: previously in school; took medical leave in 02/2012; [employer] in [place].      Tobacco: vapor cigarette two months. Pt does not smoke.       Alcohol:  Socially; weekends; no DUIs.      Drugs: marijuana daily.  Started at age 15.        Sexual activity: sexually active; 25 total partners; males; Chlamydia in 03/2012.  Genital warts 08/2013.  HIV diagnosis 12/2013.  Seatbelt: 100%      Guns:  none    Allergies  Allergen Reactions  . Other Nausea And Vomiting and Swelling    Tree Nuts (Almonds)  . Peanut-Containing Drug Products      Current outpatient prescriptions:  .  abacavir-dolutegravir-lamiVUDine (TRIUMEQ) 681-27-517 MG tablet, Take 1 tablet by mouth daily., Disp: 30 tablet, Rfl: 11 .  albuterol (PROVENTIL HFA;VENTOLIN HFA) 108 (90 Base) MCG/ACT inhaler, Inhale 2 puffs into the lungs every 6 (six) hours as needed., Disp: 1 Inhaler, Rfl: 1 .  amoxicillin-clavulanate (AUGMENTIN) 875-125 MG tablet, Take 1 tablet by mouth 2 (two) times daily., Disp: 20 tablet, Rfl: 0 .  blood glucose meter kit and supplies KIT, Dispense based on patient and insurance preference. Use up to three times daily as directed. (FOR ICD-10 E11.9), Disp: 1 each, Rfl: 0 .  doxycycline (DORYX) 100 MG EC tablet, Take 100 mg by mouth 2 (two) times daily., Disp: , Rfl:  .  imiquimod  (ALDARA) 5 % cream, Apply topically 3 (three) times a week., Disp: 12 each, Rfl: 1 .  Triamcinolone Acetonide (TRIAMCINOLONE 0.1 % CREAM : EUCERIN) CREA, Use as directed, Disp: 480 each, Rfl: 11 .  TRIUMEQ 600-50-300 MG tablet, TAKE 1 TABLET BY MOUTH DAILY., Disp: 30 tablet, Rfl: 4   Review of Systems  Constitutional: Negative for fever, chills, diaphoresis, activity change, appetite change, fatigue and unexpected weight change.  HENT: Negative for congestion, rhinorrhea, sinus pressure, sneezing, sore throat and trouble swallowing.   Eyes: Negative for photophobia and visual disturbance.  Respiratory: Negative for cough, chest tightness, shortness of breath, wheezing and stridor.   Cardiovascular: Negative for chest pain, palpitations and leg swelling.  Gastrointestinal: Negative for nausea, vomiting, abdominal pain, diarrhea, constipation, blood in stool, abdominal distention and anal bleeding.  Genitourinary: Negative for dysuria, hematuria, flank pain and difficulty urinating.  Musculoskeletal: Negative for myalgias, back pain, joint swelling, arthralgias and gait problem.  Skin: Negative for color change, pallor, rash and wound.  Neurological: Negative for dizziness, tremors, weakness and light-headedness.  Hematological: Negative for adenopathy. Does not bruise/bleed easily.  Psychiatric/Behavioral: Negative for behavioral problems, confusion, sleep disturbance, dysphoric mood, decreased concentration and agitation.       Objective:   Physical Exam  Constitutional: She is oriented to person, place, and time. She appears well-developed and well-nourished. No distress.  HENT:  Head: Normocephalic and atraumatic.  Mouth/Throat: No oropharyngeal exudate.  Eyes: Conjunctivae and EOM are normal. No scleral icterus.  Neck: Normal range of motion. Neck supple.  Cardiovascular: Normal rate and regular rhythm.   Pulmonary/Chest: Effort normal. No respiratory distress. She has no wheezes.    Abdominal: She exhibits no distension.  Musculoskeletal: She exhibits no edema or tenderness.  Neurological: She is alert and oriented to person, place, and time. She exhibits normal muscle tone. Coordination normal.  Skin: Skin is warm and dry. No rash noted. She is not diaphoretic. No erythema. No pallor.  Psychiatric: She has a normal mood and affect. Her behavior is normal. Judgment and thought content normal.          Assessment & Plan:  HIV: continue TRIUMEQ and recheck in 6 months.   Hx of ASCUS: will need regular pap smears and close followup. Also had HPV + of vulva  DM: may be able to  Cure this with ongoing weight lloss

## 2015-08-14 ENCOUNTER — Other Ambulatory Visit: Payer: Self-pay | Admitting: Infectious Disease

## 2015-11-25 ENCOUNTER — Other Ambulatory Visit: Payer: Self-pay | Admitting: Family Medicine

## 2016-01-06 ENCOUNTER — Ambulatory Visit (INDEPENDENT_AMBULATORY_CARE_PROVIDER_SITE_OTHER): Payer: 59 | Admitting: Family Medicine

## 2016-01-06 ENCOUNTER — Encounter: Payer: Self-pay | Admitting: Family Medicine

## 2016-01-06 VITALS — BP 126/72 | HR 95 | Temp 98.6°F | Resp 18 | Ht 63.0 in | Wt 241.0 lb

## 2016-01-06 DIAGNOSIS — E6609 Other obesity due to excess calories: Secondary | ICD-10-CM

## 2016-01-06 DIAGNOSIS — IMO0001 Reserved for inherently not codable concepts without codable children: Secondary | ICD-10-CM

## 2016-01-06 DIAGNOSIS — Z6839 Body mass index (BMI) 39.0-39.9, adult: Secondary | ICD-10-CM | POA: Diagnosis not present

## 2016-01-06 DIAGNOSIS — N870 Mild cervical dysplasia: Secondary | ICD-10-CM

## 2016-01-06 DIAGNOSIS — A630 Anogenital (venereal) warts: Secondary | ICD-10-CM

## 2016-01-06 DIAGNOSIS — Z3A01 Less than 8 weeks gestation of pregnancy: Secondary | ICD-10-CM | POA: Diagnosis not present

## 2016-01-06 DIAGNOSIS — E119 Type 2 diabetes mellitus without complications: Secondary | ICD-10-CM | POA: Diagnosis not present

## 2016-01-06 DIAGNOSIS — B20 Human immunodeficiency virus [HIV] disease: Secondary | ICD-10-CM

## 2016-01-06 DIAGNOSIS — Z23 Encounter for immunization: Secondary | ICD-10-CM | POA: Diagnosis not present

## 2016-01-06 NOTE — Patient Instructions (Signed)
     IF you received an x-ray today, you will receive an invoice from [facility]. Please contact [facility] at [phone] with questions or concerns regarding your invoice.   IF you received labwork today, you will receive an invoice from [facility]. Please contact [facility] at [phone] with questions or concerns regarding your invoice.   Our billing staff will not be able to assist you with questions regarding bills from these companies.  You will be contacted with the lab results as soon as they are available. The fastest way to get your results is to activate your My Chart account. Instructions are located on the last page of this paperwork. If you have not heard from us regarding the results in 2 weeks, please contact this office.      

## 2016-01-06 NOTE — Progress Notes (Signed)
Subjective:    Patient ID: Kelly Hunter, female    DOB: [birth year], 25 y.o.   MRN: [id number]  01/06/2016  Follow-up (TIIDM )   HPI This 25 y.o. female presents for follow-up.  Presented to Urgent Care at [facility]; had two positive pregnancy tests at home; then mom took to Urgent Care for confirmation.  Planning to abort at a [facility]; has first trimester pills.  Might change mind if sees ultrasound.  It's a lot.  Joint decision with boyfriend.  Today has been doubt.  Limitation is ten weeks in [place].  Can go to [facility] can terminate later but patient cannot tolerate termination later. Marland Kitchen  LMP 12-18-2015.  Approximately five weeks and 6 days.  Having some morning and night sickness.    Immunization History  Administered Date(s) Administered  . HPV 9-valent 08/05/2013, 12/16/2013  . HPV Quadrivalent 06/04/2013  . Hepatitis A, Adult 05/21/2014, 11/17/2014  . Influenza,inj,Quad PF,36+ Mos 11/18/2013, 11/17/2014, 01/06/2016  . Pneumococcal Polysaccharide-23 12/31/2013  . Tdap 11/18/2013   Wt Readings from Last 3 Encounters:  01/06/16 241 lb (109.3 kg)  08/10/15 223 lb (101.2 kg)  04/29/15 237 lb (107.5 kg)   BP Readings from Last 3 Encounters:  01/06/16 126/72  08/10/15 121/80  04/29/15 126/82   DMII: not checking sugars; cooking at home more; not eating out as much.  No longer eating at job any longer.  HIV: Patient reports good compliance with medication, good tolerance to medication, and good symptom control.  Reports compliance with regular follow up appointments with ID.  Last follow-up in 08/2015; followed every six months.  Last appointment with gynecology 07/31/14; might be overdue for repeat pap smear? S/p colposcopy on 07/31/14; ECC also obtained.  Punch bx also obtained from [facility].  Upon review of Dr. Sandrea Hughs note on 08/07/14, pt was to follow-up in one week to discuss treatment options; Dr. Toney Rakes was to discuss case with Dr. Roanna Raider Onc who  recommended repeat colposcopy every 6 months but no acute or immediate treatment.    Review of Systems  Constitutional: Negative for chills, diaphoresis, fatigue and fever.  Eyes: Negative for visual disturbance.  Respiratory: Negative for cough and shortness of breath.   Cardiovascular: Negative for chest pain, palpitations and leg swelling.  Gastrointestinal: Negative for abdominal pain, constipation, diarrhea, nausea and vomiting.  Endocrine: Negative for cold intolerance, heat intolerance, polydipsia, polyphagia and polyuria.  Neurological: Negative for dizziness, tremors, seizures, syncope, facial asymmetry, speech difficulty, weakness, light-headedness, numbness and headaches.    Past Medical History:  Diagnosis Date  . Acne   . Allergy    Allegra  . Asthma    mild; onset in childhood.  No hospitalizations.  . Chlamydia 03/03/2012  . CIN I (cervical intraepithelial neoplasia I) 08/31/2013   colposcopy by Toney Rakes; s/p CO2 laser treatment.  . Eczema   . Genital warts 08/31/2013   Aldara treatment; Toney Rakes.  . Hidradenitis   . Hidradenitis axillaris 05/21/2014  . HIV (human immunodeficiency virus infection) ([place])   . Substance abuse   . Type 2 diabetes mellitus without complication, without long-term current use of insulin ([place]) 11/17/2014  . VIN I (vulvar intraepithelial neoplasia I) 08/31/2013   s/p CO2 treatment; [facility]/gyn.   Past Surgical History:  Procedure Laterality Date  . BREAST SURGERY  02/01/2008   Reduction  . CO2 LASER APPLICATION N/A 7/79/3903   Procedure: C02 laser of vagina and cervix;  Surgeon: Terrance Mass, MD;  Location: [facility];  Service: Gynecology;  Laterality: N/A;   Allergies  Allergen Reactions  . Other Nausea And Vomiting and Swelling    Tree Nuts (Almonds)  . Peanut-Containing Drug Products    Current Outpatient Prescriptions  Medication Sig Dispense Refill  . albuterol (PROVENTIL HFA;VENTOLIN HFA) 108 (90 Base) MCG/ACT inhaler Inhale 2  puffs into the lungs every 6 (six) hours as needed. 1 Inhaler 1  . blood glucose meter kit and supplies KIT Dispense based on patient and insurance preference. Use up to three times daily as directed. (FOR ICD-10 E11.9) 1 each 0  . doxycycline (DORYX) 100 MG EC tablet Take 100 mg by mouth 2 (two) times daily.    . imiquimod (ALDARA) 5 % cream Apply topically 3 (three) times a week. 12 each 1  . Triamcinolone Acetonide (TRIAMCINOLONE 0.1 % CREAM : EUCERIN) CREA Use as directed 480 each 11  . TRIUMEQ 600-50-300 MG tablet TAKE ONE TABLET BY MOUTH ONCE DAILY. STORE IN ORIGINAL BOTTLE AT [facility]. 30 tablet 5   No current facility-administered medications for this visit.    Social History   Social History  . Marital status: Single    Spouse name: n/a  . Number of children: 0  . Years of education: N/A   Occupational History  . Manager     [employer]   Social History Main Topics  . Smoking status: Never Smoker  . Smokeless tobacco: Never Used  . Alcohol use No     Comment: previously used socially  . Drug use: No  . Sexual activity: Yes    Partners: Male    Birth control/ protection: Pill     Comment: 4 total sexual partners; history of Chlamydia in 03/2012. Genital warts 08/31/2013.   Other Topics Concern  . Not on file   Social History Narrative   Marital status: single;  Not dating in 2016.      Children: none; never pregnant.      Lives: with mom; dad in [place].     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      Education: previously in school; took medical leave in 02/2012; [employer] in [place].      Tobacco: vapor cigarette two months. Pt does not smoke.       Alcohol:  Socially; weekends; no DUIs.      Drugs: marijuana daily.  Started at age 59.        Sexual activity: sexually active; 25 total partners; males; Chlamydia in 03/2012.  Genital warts 08/2013.  HIV diagnosis 12/2013.      Seatbelt: 100%      Guns:  none   Family History   Problem Relation Age of Onset  . Diabetes Mother   . Hypertension Mother   . Hyperlipidemia Mother   . Mental illness Mother   . Cancer Maternal Grandfather     ?       Objective:    BP 126/72 (BP Location: Left Arm, Patient Position: Sitting, Cuff Size: Large)   Pulse 95   Temp 98.6 F (37 C) (Oral)   Resp 18   Ht _0  (1.6 m)   Wt 241 lb (109.3 kg)   LMP 11/17/2015   SpO2 98%   BMI 42.69 kg/m  Physical Exam  Constitutional: She is oriented to person, place, and time. She appears well-developed and well-nourished. No distress.  HENT:  Head: Normocephalic and atraumatic.  Right Ear: External ear normal.  Left Ear: External ear normal.  Nose: Nose normal.  Mouth/Throat: Oropharynx is clear and moist.  Eyes: Conjunctivae and EOM are normal. Pupils are equal, round, and reactive to light.  Neck: Normal range of motion. Neck supple. Carotid bruit is not present. No thyromegaly present.  Cardiovascular: Normal rate, regular rhythm, normal heart sounds and intact distal pulses.  Exam reveals no gallop and no friction rub.   No murmur heard. Pulmonary/Chest: Effort normal and breath sounds normal. She has no wheezes. She has no rales.  Abdominal: Soft. Bowel sounds are normal. She exhibits no distension and no mass. There is no tenderness. There is no rebound and no guarding.  Lymphadenopathy:    She has no cervical adenopathy.  Neurological: She is alert and oriented to person, place, and time. No cranial nerve deficit.  Skin: Skin is warm and dry. No rash noted. She is not diaphoretic. No erythema. No pallor.  Psychiatric: She has a normal mood and affect. Her behavior is normal.   Results for orders placed or performed in visit on 01/06/16  CBC with Differential/Platelet  Result Value Ref Range   WBC 12.0 (H) 3.4 - 10.8 x10E3/uL   RBC 4.27 3.77 - 5.28 x10E6/uL   Hemoglobin 11.9 11.1 - 15.9 g/dL   Hematocrit 35.3 34.0 - 46.6 %   MCV 83 79 - 97 fL   MCH 27.9 26.6 -  33.0 pg   MCHC 33.7 31.5 - 35.7 g/dL   RDW 16.3 (H) 12.3 - 15.4 %   Platelets 413 (H) 150 - 379 x10E3/uL   Neutrophils 74 Not Estab. %   Lymphs 18 Not Estab. %   Monocytes 7 Not Estab. %   Eos 1 Not Estab. %   Basos 0 Not Estab. %   Neutrophils Absolute 8.8 (H) 1.4 - 7.0 x10E3/uL   Lymphocytes Absolute 2.1 0.7 - 3.1 x10E3/uL   Monocytes Absolute 0.9 0.1 - 0.9 x10E3/uL   EOS (ABSOLUTE) 0.1 0.0 - 0.4 x10E3/uL   Basophils Absolute 0.0 0.0 - 0.2 x10E3/uL   Immature Granulocytes 0 Not Estab. %   Immature Grans (Abs) 0.0 0.0 - 0.1 x10E3/uL  Comprehensive metabolic panel  Result Value Ref Range   Glucose 99 65 - 99 mg/dL   BUN 7 6 - 20 mg/dL   Creatinine, Ser 0.47 (L) 0.57 - 1.00 mg/dL   GFR calc non Af Amer 140 >59 mL/min/1.73   GFR calc Af Amer 161 >59 mL/min/1.73   BUN/Creatinine Ratio 15 9 - 23   Sodium 139 134 - 144 mmol/L   Potassium 4.1 3.5 - 5.2 mmol/L   Chloride 101 96 - 106 mmol/L   CO2 25 18 - 29 mmol/L   Calcium 9.0 8.7 - 10.2 mg/dL   Total Protein 6.8 6.0 - 8.5 g/dL   Albumin 4.2 3.5 - 5.5 g/dL   Globulin, Total 2.6 1.5 - 4.5 g/dL   Albumin/Globulin Ratio 1.6 1.2 - 2.2   Bilirubin Total <0.2 0.0 - 1.2 mg/dL   Alkaline Phosphatase 40 39 - 117 IU/L   AST 12 0 - 40 IU/L   ALT 8 0 - 32 IU/L  Hemoglobin A1c  Result Value Ref Range   Hgb A1c MFr Bld 5.7 (H) 4.8 - 5.6 %   Est. average glucose Bld gHb Est-mCnc 117 mg/dL       Assessment & Plan:   1. Type 2 diabetes mellitus without complication, without long-term current use of insulin ([place])   2. HIV disease ([place])   3. Need for prophylactic vaccination and  inoculation against influenza   4. Condyloma acuminatum of vulva   5. Dysplasia of cervix, low grade (CIN 1)   6. Class 2 obesity due to excess calories with serious comorbidity and body mass index (BMI) of 39.0 to 39.9 in adult   7. Less than [redacted] weeks gestation of pregnancy    -sugars improved -counseled extensively during visit regarding current pregnancy; has  appointment this week for TAB; discussed at length with patient during visit. -overdue for six month colposcopy with gynecology; will place referral for follow-up appointment with Toney Rakes.   Orders Placed This Encounter  Procedures  . Flu Vaccine QUAD 36+ mos PF IM (Fluarix & Fluzone Quad PF)  . CBC with Differential/Platelet  . Comprehensive metabolic panel  . Hemoglobin A1c  . Ambulatory referral to Gynecology    Referral Priority:   Routine    Referral Type:   Consultation    Referral Reason:   Specialty Services Required    Referred to Provider:   Terrance Mass, MD    Requested Specialty:   Gynecology    Number of Visits Requested:   1   No orders of the defined types were placed in this encounter.   Return in about 6 months (around 07/06/2016) for complete physical examiniation.   Kristi Elayne Guerin, M.D. [facility] [address] [place], [place]  [address] [phone] phone [phone] fax

## 2016-01-07 LAB — HEMOGLOBIN A1C
Est. average glucose Bld gHb Est-mCnc: 117 mg/dL
Hgb A1c MFr Bld: 5.7 % — ABNORMAL HIGH (ref 4.8–5.6)

## 2016-01-07 LAB — COMPREHENSIVE METABOLIC PANEL
ALT: 8 IU/L (ref 0–32)
AST: 12 IU/L (ref 0–40)
Albumin/Globulin Ratio: 1.6 (ref 1.2–2.2)
Albumin: 4.2 g/dL (ref 3.5–5.5)
Alkaline Phosphatase: 40 IU/L (ref 39–117)
BUN/Creatinine Ratio: 15 (ref 9–23)
BUN: 7 mg/dL (ref 6–20)
CALCIUM: 9 mg/dL (ref 8.7–10.2)
CHLORIDE: 101 mmol/L (ref 96–106)
CO2: 25 mmol/L (ref 18–29)
Creatinine, Ser: 0.47 mg/dL — ABNORMAL LOW (ref 0.57–1.00)
GFR, EST AFRICAN AMERICAN: 161 mL/min/{1.73_m2} (ref >59)
GFR, EST NON AFRICAN AMERICAN: 140 mL/min/{1.73_m2} (ref >59)
GLUCOSE: 99 mg/dL (ref 65–99)
Globulin, Total: 2.6 g/dL (ref 1.5–4.5)
Potassium: 4.1 mmol/L (ref 3.5–5.2)
Sodium: 139 mmol/L (ref 134–144)
TOTAL PROTEIN: 6.8 g/dL (ref 6.0–8.5)

## 2016-01-07 LAB — CBC WITH DIFFERENTIAL/PLATELET
BASOS ABS: 0 10*3/uL (ref 0.0–0.2)
Basos: 0 %
EOS (ABSOLUTE): 0.1 10*3/uL (ref 0.0–0.4)
Eos: 1 %
Hematocrit: 35.3 % (ref 34.0–46.6)
Hemoglobin: 11.9 g/dL (ref 11.1–15.9)
IMMATURE GRANS (ABS): 0 10*3/uL (ref 0.0–0.1)
IMMATURE GRANULOCYTES: 0 %
LYMPHS: 18 %
Lymphocytes Absolute: 2.1 10*3/uL (ref 0.7–3.1)
MCH: 27.9 pg (ref 26.6–33.0)
MCHC: 33.7 g/dL (ref 31.5–35.7)
MCV: 83 fL (ref 79–97)
Monocytes Absolute: 0.9 10*3/uL (ref 0.1–0.9)
Monocytes: 7 %
NEUTROS PCT: 74 %
Neutrophils Absolute: 8.8 10*3/uL — ABNORMAL HIGH (ref 1.4–7.0)
PLATELETS: 413 10*3/uL — AB (ref 150–379)
RBC: 4.27 x10E6/uL (ref 3.77–5.28)
RDW: 16.3 % — ABNORMAL HIGH (ref 12.3–15.4)
WBC: 12 10*3/uL — ABNORMAL HIGH (ref 3.4–10.8)

## 2016-01-13 ENCOUNTER — Encounter: Payer: Self-pay | Admitting: Family Medicine

## 2016-01-15 ENCOUNTER — Other Ambulatory Visit: Payer: Self-pay | Admitting: Infectious Disease

## 2016-01-19 ENCOUNTER — Encounter: Payer: Self-pay | Admitting: Family Medicine

## 2016-01-27 ENCOUNTER — Encounter: Payer: 59 | Admitting: Family Medicine

## 2016-02-20 ENCOUNTER — Encounter: Payer: Self-pay | Admitting: Family Medicine

## 2016-02-25 ENCOUNTER — Encounter: Payer: Self-pay | Admitting: Family Medicine

## 2016-02-25 MED ORDER — AMBULATORY NON FORMULARY MEDICATION: 5 refills | Status: DC

## 2016-02-25 NOTE — Telephone Encounter (Signed)
Please call in boric acid suppositories as prescribed; will not escribe for some reason.

## 2016-03-01 ENCOUNTER — Encounter: Payer: Self-pay | Admitting: Family Medicine

## 2016-03-07 ENCOUNTER — Encounter: Payer: Self-pay | Admitting: Family Medicine

## 2016-03-22 ENCOUNTER — Other Ambulatory Visit: Payer: Self-pay | Admitting: Family Medicine

## 2016-04-27 ENCOUNTER — Other Ambulatory Visit: Payer: Self-pay | Admitting: *Deleted

## 2016-04-27 DIAGNOSIS — A6000 Herpesviral infection of urogenital system, unspecified: Secondary | ICD-10-CM | POA: Insufficient documentation

## 2016-04-27 MED ORDER — VALACYCLOVIR HCL 1 G PO TABS: 1000.0000 mg | ORAL_TABLET | Freq: Every day | ORAL | 5 refills | Status: DC

## 2016-06-15 ENCOUNTER — Encounter: Payer: Self-pay | Admitting: Gynecology

## 2016-07-04 ENCOUNTER — Other Ambulatory Visit: Payer: Self-pay | Admitting: Infectious Disease

## 2016-07-19 ENCOUNTER — Other Ambulatory Visit (HOSPITAL_COMMUNITY)
Admission: RE | Admit: 2016-07-19 | Discharge: 2016-07-19 | Disposition: A | Discharge status: Home / Self Care | Payer: 59 | Source: Ambulatory Visit | Attending: Infectious Disease | Admitting: Infectious Disease

## 2016-07-19 ENCOUNTER — Other Ambulatory Visit: Payer: 59

## 2016-07-19 DIAGNOSIS — B20 Human immunodeficiency virus [HIV] disease: Secondary | ICD-10-CM | POA: Insufficient documentation

## 2016-07-19 LAB — CBC WITH DIFFERENTIAL/PLATELET
BASOS ABS: 0 {cells}/uL (ref 0–200)
Basophils Relative: 0 %
EOS ABS: 103 {cells}/uL (ref 15–500)
Eosinophils Relative: 1 %
HEMATOCRIT: 37.6 % (ref 35.0–45.0)
Hemoglobin: 12.6 g/dL (ref 11.7–15.5)
Lymphocytes Relative: 46 %
Lymphs Abs: 4738 cells/uL — ABNORMAL HIGH (ref 850–3900)
MCH: 28.9 pg (ref 27.0–33.0)
MCHC: 33.5 g/dL (ref 32.0–36.0)
MCV: 86.2 fL (ref 80.0–100.0)
MONO ABS: 618 {cells}/uL (ref 200–950)
MPV: 9.2 fL (ref 7.5–12.5)
Monocytes Relative: 6 %
NEUTROS ABS: 4841 {cells}/uL (ref 1500–7800)
NEUTROS PCT: 47 %
Platelets: 380 10*3/uL (ref 140–400)
RBC: 4.36 MIL/uL (ref 3.80–5.10)
RDW: 16.6 % — ABNORMAL HIGH (ref 11.0–15.0)
WBC: 10.3 10*3/uL (ref 3.8–10.8)

## 2016-07-20 LAB — COMPLETE METABOLIC PANEL WITH GFR
ALBUMIN: 4.4 g/dL (ref 3.6–5.1)
ALT: 10 U/L (ref 6–29)
AST: 12 U/L (ref 10–30)
Alkaline Phosphatase: 36 U/L (ref 33–115)
BILIRUBIN TOTAL: 0.3 mg/dL (ref 0.2–1.2)
BUN: 9 mg/dL (ref 7–25)
CALCIUM: 9.2 mg/dL (ref 8.6–10.2)
CO2: 24 mmol/L (ref 20–31)
Chloride: 104 mmol/L (ref 98–110)
Creat: 0.72 mg/dL (ref 0.50–1.10)
GFR, Est African American: >89 mL/min (ref >=60)
Glucose, Bld: 78 mg/dL (ref 65–99)
POTASSIUM: 4.3 mmol/L (ref 3.5–5.3)
Sodium: 137 mmol/L (ref 135–146)
TOTAL PROTEIN: 7.5 g/dL (ref 6.1–8.1)

## 2016-07-20 LAB — LIPID PANEL
Cholesterol: 162 mg/dL (ref <200)
HDL: 37 mg/dL — AB (ref >50)
LDL Cholesterol: 104 mg/dL — ABNORMAL HIGH (ref <100)
TRIGLYCERIDES: 107 mg/dL (ref <150)
Total CHOL/HDL Ratio: 4.4 Ratio (ref <5.0)
VLDL: 21 mg/dL (ref <30)

## 2016-07-20 LAB — RPR

## 2016-07-21 LAB — T-HELPER CELL (CD4) - (RCID CLINIC ONLY)
CD4 % Helper T Cell: 47 % (ref 33–55)
CD4 T CELL ABS: 2180 /uL (ref 400–2700)

## 2016-07-21 LAB — HIV-1 RNA QUANT-NO REFLEX-BLD
HIV 1 RNA QUANT: DETECTED {copies}/mL — AB
HIV-1 RNA QUANT, LOG: DETECTED {Log_copies}/mL — AB

## 2016-07-21 LAB — URINE CYTOLOGY ANCILLARY ONLY
Chlamydia: NEGATIVE
Neisseria Gonorrhea: NEGATIVE

## 2016-07-29 ENCOUNTER — Other Ambulatory Visit: Payer: Self-pay | Admitting: Infectious Disease

## 2016-08-01 ENCOUNTER — Encounter: Payer: Self-pay | Admitting: Infectious Disease

## 2016-08-01 ENCOUNTER — Ambulatory Visit (INDEPENDENT_AMBULATORY_CARE_PROVIDER_SITE_OTHER): Payer: 59 | Admitting: Infectious Disease

## 2016-08-01 VITALS — BP 120/80 | HR 86 | Temp 98.6°F | Ht 62.0 in | Wt 226.0 lb

## 2016-08-01 DIAGNOSIS — Z23 Encounter for immunization: Secondary | ICD-10-CM | POA: Diagnosis not present

## 2016-08-01 DIAGNOSIS — N900 Mild vulvar dysplasia: Secondary | ICD-10-CM

## 2016-08-01 DIAGNOSIS — B20 Human immunodeficiency virus [HIV] disease: Secondary | ICD-10-CM

## 2016-08-01 DIAGNOSIS — A630 Anogenital (venereal) warts: Secondary | ICD-10-CM | POA: Diagnosis not present

## 2016-08-01 NOTE — Progress Notes (Signed)
Chief complaint: followup for HIV  Subjective:    Patient ID: Kelly Hunter, female    DOB: 08/14/1990, 26 y.o.   MRN: [id number]  HPI  26 year-old African-American lady with HIV disease with perfect virological suppression on TRIUMEQ  She is here for yearly followup. She is in very good spirits.    Lab Results  Component Value Date   HIV1RNAQUANT <20 DETECTED (A) 07/19/2016   HIV1RNAQUANT <20 07/27/2015   HIV1RNAQUANT 26 (H) 11/12/2014     Her CD4 is  Lab Results  Component Value Date   CD4TABS 2,180 07/19/2016   CD4TABS 870 07/27/2015   CD4TABS 690 11/12/2014   Past Medical History:  Diagnosis Date  . Acne   . Allergy    Allegra  . Asthma    mild; onset in childhood.  No hospitalizations.  . Chlamydia 03/03/2012  . CIN I (cervical intraepithelial neoplasia I) 08/31/2013   colposcopy by Toney Rakes; s/p CO2 laser treatment.  . Eczema   . Genital warts 08/31/2013   Aldara treatment; Toney Rakes.  . Hidradenitis   . Hidradenitis axillaris 05/21/2014  . HIV (human immunodeficiency virus infection) ([place])   . Substance abuse   . Type 2 diabetes mellitus without complication, without long-term current use of insulin ([place]) 11/17/2014  . VIN I (vulvar intraepithelial neoplasia I) 08/31/2013   s/p CO2 treatment; Fernandez/gyn.    Past Surgical History:  Procedure Laterality Date  . BREAST SURGERY  02/01/2008   Reduction  . CO2 LASER APPLICATION N/A 9/82/6415   Procedure: C02 laser of vagina and cervix;  Surgeon: Terrance Mass, MD;  Location: [facility];  Service: Gynecology;  Laterality: N/A;    Family History  Problem Relation Age of Onset  . Diabetes Mother   . Hypertension Mother   . Hyperlipidemia Mother   . Mental illness Mother   . Cancer Maternal Grandfather        ?      Social History   Social History  . Marital status: Single    Spouse name: n/a  . Number of children: 0  . Years of education: N/A   Occupational History  . Manager     [employer]    Social History Main Topics  . Smoking status: Never Smoker  . Smokeless tobacco: Never Used  . Alcohol use 0.0 oz/week     Comment: socially  . Drug use: No  . Sexual activity: Yes    Partners: Male    Birth control/ protection: Pill     Comment: 4 total sexual partners; history of Chlamydia in 03/2012. Genital warts 08/31/2013.   Other Topics Concern  . None   Social History Narrative   Marital status: single;  Not dating in 2016.      Children: none; never pregnant.      Lives: with mom; dad in [place].     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      Education: previously in school; took medical leave in 02/2012; [employer] in [place].      Tobacco: vapor cigarette two months. Pt does not smoke.       Alcohol:  Socially; weekends; no DUIs.      Drugs: marijuana daily.  Started at age 26.        Sexual activity: sexually active; 25 total partners; males; Chlamydia in 03/2012.  Genital warts 08/2013.  HIV diagnosis 12/2013.      Seatbelt: 100%  Guns:  none    Allergies  Allergen Reactions  . Other Nausea And Vomiting and Swelling    Tree Nuts (Almonds)  . Peanut-Containing Drug Products      Current Outpatient Prescriptions:  .  albuterol (PROVENTIL HFA;VENTOLIN HFA) 108 (90 Base) MCG/ACT inhaler, Inhale 2 puffs into the lungs every 6 (six) hours as needed., Disp: 1 Inhaler, Rfl: 1 .  AMBULATORY NON FORMULARY MEDICATION, Boric Acid Suppository 600 mg Insert 1 PV twice weekly to maintain vaginal flora, Disp: 10 suppository, Rfl: 5 .  imiquimod (ALDARA) 5 % cream, Apply topically 3 (three) times a week., Disp: 12 each, Rfl: 1 .  Triamcinolone Acetonide (TRIAMCINOLONE 0.1 % CREAM : EUCERIN) CREA, Use as directed, Disp: 480 each, Rfl: 11 .  triamcinolone cream (KENALOG) 0.1 %, APPLY AS DIRECTED, Disp: 454 g, Rfl: 0 .  TRIUMEQ 600-50-300 MG tablet, TAKE ONE TABLET BY MOUTH ONCE DAILY. STORE IN ORIGINAL BOTTLE AT [facility] Disp:  30 tablet, Rfl: 1 .  valACYclovir (VALTREX) 1000 MG tablet, Take 1 tablet (1,000 mg total) by mouth daily., Disp: 30 tablet, Rfl: 5 .  blood glucose meter kit and supplies KIT, Dispense based on patient and insurance preference. Use up to three times daily as directed. (FOR ICD-10 E11.9) (Patient not taking: Reported on 08/01/2016), Disp: 1 each, Rfl: 0   Review of Systems  Constitutional: Negative for activity change, appetite change, chills, diaphoresis, fatigue, fever and unexpected weight change.  HENT: Negative for congestion, rhinorrhea, sinus pressure, sneezing, sore throat and trouble swallowing.   Eyes: Negative for photophobia and visual disturbance.  Respiratory: Negative for cough, shortness of breath, wheezing and stridor.   Cardiovascular: Negative for chest pain, palpitations and leg swelling.  Gastrointestinal: Negative for abdominal distention, abdominal pain, anal bleeding, blood in stool, constipation, diarrhea, nausea and vomiting.  Genitourinary: Negative for difficulty urinating, dysuria, flank pain and hematuria.  Musculoskeletal: Negative for arthralgias, back pain, gait problem, joint swelling and myalgias.  Skin: Negative for color change, pallor, rash and wound.  Neurological: Negative for dizziness, tremors, weakness and light-headedness.  Hematological: Negative for adenopathy. Does not bruise/bleed easily.  Psychiatric/Behavioral: Negative for agitation, behavioral problems, confusion, decreased concentration, dysphoric mood and sleep disturbance.       Objective:   Physical Exam  Constitutional: She is oriented to person, place, and time. She appears well-developed and well-nourished. No distress.  HENT:  Head: Normocephalic and atraumatic.  Eyes: EOM are normal. No scleral icterus.  Neck: Normal range of motion. Neck supple.  Cardiovascular: Normal rate and regular rhythm.   Pulmonary/Chest: Effort normal. No respiratory distress. She has no wheezes.   Abdominal: She exhibits no distension.  Musculoskeletal: She exhibits no edema or tenderness.  Neurological: She is alert and oriented to person, place, and time. She exhibits normal muscle tone. Coordination normal.  Skin: Skin is warm and dry. No rash noted. She is not diaphoretic. No erythema. No pallor.  Psychiatric: She has a normal mood and affect. Her behavior is normal. Judgment and thought content normal.          Assessment & Plan:  HIV: continue [place] and recheck in one year.   Hx of ASCUS: will need regular pap smears and close followup. Also had HPV + of vulva  DM: has been cured by weight loss

## 2016-08-12 NOTE — Addendum Note (Signed)
Addended by: Roma Kayser on: 08/12/2016 12:14 PM   Modules accepted: Orders

## 2016-10-10 ENCOUNTER — Other Ambulatory Visit: Payer: Self-pay | Admitting: *Deleted

## 2016-10-10 DIAGNOSIS — B20 Human immunodeficiency virus [HIV] disease: Secondary | ICD-10-CM

## 2016-10-10 MED ORDER — ABACAVIR-DOLUTEGRAVIR-LAMIVUD 600-50-300 MG PO TABS: ORAL_TABLET | ORAL | 3 refills | Status: DC

## 2016-10-17 ENCOUNTER — Ambulatory Visit (INDEPENDENT_AMBULATORY_CARE_PROVIDER_SITE_OTHER): Payer: 59 | Admitting: *Deleted

## 2016-10-17 DIAGNOSIS — Z23 Encounter for immunization: Secondary | ICD-10-CM | POA: Diagnosis not present

## 2016-10-17 DIAGNOSIS — B20 Human immunodeficiency virus [HIV] disease: Secondary | ICD-10-CM | POA: Diagnosis not present

## 2016-12-04 ENCOUNTER — Encounter: Payer: Self-pay | Admitting: Family Medicine

## 2016-12-04 ENCOUNTER — Other Ambulatory Visit: Payer: Self-pay | Admitting: Infectious Disease

## 2016-12-04 DIAGNOSIS — A6000 Herpesviral infection of urogenital system, unspecified: Secondary | ICD-10-CM

## 2017-03-07 ENCOUNTER — Other Ambulatory Visit: Payer: Self-pay

## 2017-03-07 ENCOUNTER — Encounter (HOSPITAL_COMMUNITY): Payer: Self-pay

## 2017-03-07 DIAGNOSIS — N611 Abscess of the breast and nipple: Secondary | ICD-10-CM | POA: Diagnosis present

## 2017-03-07 DIAGNOSIS — Z79899 Other long term (current) drug therapy: Secondary | ICD-10-CM

## 2017-03-07 DIAGNOSIS — E876 Hypokalemia: Secondary | ICD-10-CM | POA: Diagnosis present

## 2017-03-07 DIAGNOSIS — B20 Human immunodeficiency virus [HIV] disease: Secondary | ICD-10-CM | POA: Diagnosis present

## 2017-03-07 DIAGNOSIS — E119 Type 2 diabetes mellitus without complications: Secondary | ICD-10-CM | POA: Diagnosis present

## 2017-03-07 DIAGNOSIS — Z6841 Body Mass Index (BMI) 40.0 and over, adult: Secondary | ICD-10-CM

## 2017-03-07 DIAGNOSIS — A419 Sepsis, unspecified organism: Principal | ICD-10-CM | POA: Diagnosis present

## 2017-03-07 DIAGNOSIS — D649 Anemia, unspecified: Secondary | ICD-10-CM | POA: Diagnosis present

## 2017-03-07 LAB — URINALYSIS, ROUTINE W REFLEX MICROSCOPIC
Bilirubin Urine: NEGATIVE
Glucose, UA: NEGATIVE mg/dL
Ketones, ur: 5 mg/dL — AB
Nitrite: NEGATIVE
PH: 5 (ref 5.0–8.0)
Protein, ur: 30 mg/dL — AB
SPECIFIC GRAVITY, URINE: 1.025 (ref 1.005–1.030)

## 2017-03-07 LAB — CBC WITH DIFFERENTIAL/PLATELET
Basophils Absolute: 0 10*3/uL (ref 0.0–0.1)
Basophils Relative: 0 %
EOS PCT: 1 %
Eosinophils Absolute: 0.1 10*3/uL (ref 0.0–0.7)
HCT: 33.6 % — ABNORMAL LOW (ref 36.0–46.0)
Hemoglobin: 11.9 g/dL — ABNORMAL LOW (ref 12.0–15.0)
LYMPHS ABS: 3.1 10*3/uL (ref 0.7–4.0)
LYMPHS PCT: 16 %
MCH: 29.4 pg (ref 26.0–34.0)
MCHC: 35.4 g/dL (ref 30.0–36.0)
MCV: 83 fL (ref 78.0–100.0)
MONO ABS: 1.2 10*3/uL — AB (ref 0.1–1.0)
Monocytes Relative: 7 %
Neutro Abs: 14.5 10*3/uL — ABNORMAL HIGH (ref 1.7–7.7)
Neutrophils Relative %: 76 %
PLATELETS: 407 10*3/uL — AB (ref 150–400)
RBC: 4.05 MIL/uL (ref 3.87–5.11)
RDW: 13.7 % (ref 11.5–15.5)
WBC: 19 10*3/uL — ABNORMAL HIGH (ref 4.0–10.5)

## 2017-03-07 LAB — I-STAT BETA HCG BLOOD, ED (MC, WL, AP ONLY)

## 2017-03-07 NOTE — ED Triage Notes (Signed)
Pt states that for the past week she has had a swollen L breast, red and warm, with abscess under breast,

## 2017-03-08 ENCOUNTER — Encounter (HOSPITAL_COMMUNITY): Payer: Self-pay | Admitting: Emergency Medicine

## 2017-03-08 ENCOUNTER — Emergency Department (HOSPITAL_COMMUNITY): Payer: 59

## 2017-03-08 ENCOUNTER — Inpatient Hospital Stay (HOSPITAL_COMMUNITY)
Admission: EM | Admit: 2017-03-08 | Discharge: 2017-03-10 | DRG: 975 | Disposition: A | Discharge status: Home Health | Payer: 59 | Attending: Internal Medicine | Admitting: Internal Medicine

## 2017-03-08 DIAGNOSIS — N611 Abscess of the breast and nipple: Secondary | ICD-10-CM | POA: Diagnosis present

## 2017-03-08 DIAGNOSIS — A419 Sepsis, unspecified organism: Principal | ICD-10-CM

## 2017-03-08 DIAGNOSIS — Z79899 Other long term (current) drug therapy: Secondary | ICD-10-CM | POA: Diagnosis not present

## 2017-03-08 DIAGNOSIS — B20 Human immunodeficiency virus [HIV] disease: Secondary | ICD-10-CM | POA: Diagnosis present

## 2017-03-08 DIAGNOSIS — E119 Type 2 diabetes mellitus without complications: Secondary | ICD-10-CM | POA: Diagnosis not present

## 2017-03-08 DIAGNOSIS — L03818 Cellulitis of other sites: Secondary | ICD-10-CM

## 2017-03-08 DIAGNOSIS — E876 Hypokalemia: Secondary | ICD-10-CM | POA: Diagnosis present

## 2017-03-08 DIAGNOSIS — L0291 Cutaneous abscess, unspecified: Secondary | ICD-10-CM | POA: Diagnosis present

## 2017-03-08 DIAGNOSIS — Z6841 Body Mass Index (BMI) 40.0 and over, adult: Secondary | ICD-10-CM | POA: Diagnosis not present

## 2017-03-08 DIAGNOSIS — D649 Anemia, unspecified: Secondary | ICD-10-CM | POA: Diagnosis present

## 2017-03-08 HISTORY — DX: Herpesviral infection, unspecified: B00.9

## 2017-03-08 LAB — COMPREHENSIVE METABOLIC PANEL
ALK PHOS: 55 U/L (ref 38–126)
ALK PHOS: 44 U/L (ref 38–126)
ALT: 12 U/L — ABNORMAL LOW (ref 14–54)
ALT: 11 U/L — AB (ref 14–54)
ANION GAP: 13 (ref 5–15)
AST: 14 U/L — ABNORMAL LOW (ref 15–41)
AST: 12 U/L — ABNORMAL LOW (ref 15–41)
Albumin: 3.6 g/dL (ref 3.5–5.0)
Albumin: 2.9 g/dL — ABNORMAL LOW (ref 3.5–5.0)
Anion gap: 11 (ref 5–15)
BILIRUBIN TOTAL: 0.6 mg/dL (ref 0.3–1.2)
BUN: 6 mg/dL (ref 6–20)
BUN: 6 mg/dL (ref 6–20)
CALCIUM: 7.9 mg/dL — AB (ref 8.9–10.3)
CHLORIDE: 101 mmol/L (ref 101–111)
CO2: 23 mmol/L (ref 22–32)
CO2: 22 mmol/L (ref 22–32)
CREATININE: 0.79 mg/dL (ref 0.44–1.00)
CREATININE: 0.81 mg/dL (ref 0.44–1.00)
Calcium: 9.3 mg/dL (ref 8.9–10.3)
Chloride: 104 mmol/L (ref 101–111)
GFR calc Af Amer: >60 mL/min (ref >60)
Glucose, Bld: 154 mg/dL — ABNORMAL HIGH (ref 65–99)
Glucose, Bld: 109 mg/dL — ABNORMAL HIGH (ref 65–99)
Potassium: 3.7 mmol/L (ref 3.5–5.1)
Potassium: 3.4 mmol/L — ABNORMAL LOW (ref 3.5–5.1)
SODIUM: 137 mmol/L (ref 135–145)
Sodium: 137 mmol/L (ref 135–145)
TOTAL PROTEIN: 8 g/dL (ref 6.5–8.1)
TOTAL PROTEIN: 6.2 g/dL — AB (ref 6.5–8.1)
Total Bilirubin: 0.7 mg/dL (ref 0.3–1.2)

## 2017-03-08 LAB — CBC WITH DIFFERENTIAL/PLATELET
BASOS PCT: 0 %
Basophils Absolute: 0 10*3/uL (ref 0.0–0.1)
EOS ABS: 0.2 10*3/uL (ref 0.0–0.7)
EOS PCT: 1 %
HCT: 28.5 % — ABNORMAL LOW (ref 36.0–46.0)
HEMOGLOBIN: 9.9 g/dL — AB (ref 12.0–15.0)
Lymphocytes Relative: 23 %
Lymphs Abs: 3.8 10*3/uL (ref 0.7–4.0)
MCH: 28.7 pg (ref 26.0–34.0)
MCHC: 34.7 g/dL (ref 30.0–36.0)
MCV: 82.6 fL (ref 78.0–100.0)
Monocytes Absolute: 1.3 10*3/uL — ABNORMAL HIGH (ref 0.1–1.0)
Monocytes Relative: 8 %
NEUTROS ABS: 11.4 10*3/uL — AB (ref 1.7–7.7)
NEUTROS PCT: 68 %
Platelets: 355 10*3/uL (ref 150–400)
RBC: 3.45 MIL/uL — ABNORMAL LOW (ref 3.87–5.11)
RDW: 13.8 % (ref 11.5–15.5)
WBC: 16.7 10*3/uL — ABNORMAL HIGH (ref 4.0–10.5)

## 2017-03-08 LAB — CBG MONITORING, ED
Glucose-Capillary: 105 mg/dL — ABNORMAL HIGH (ref 65–99)
Glucose-Capillary: 119 mg/dL — ABNORMAL HIGH (ref 65–99)
Glucose-Capillary: 163 mg/dL — ABNORMAL HIGH (ref 65–99)

## 2017-03-08 LAB — GLUCOSE, CAPILLARY
Glucose-Capillary: 118 mg/dL — ABNORMAL HIGH (ref 65–99)
Glucose-Capillary: 122 mg/dL — ABNORMAL HIGH (ref 65–99)

## 2017-03-08 LAB — APTT: APTT: 36 s (ref 24–36)

## 2017-03-08 LAB — PROTIME-INR
INR: 1.17
Prothrombin Time: 14.8 seconds (ref 11.4–15.2)

## 2017-03-08 LAB — I-STAT CG4 LACTIC ACID, ED: Lactic Acid, Venous: 2.49 mmol/L (ref 0.5–1.9)

## 2017-03-08 LAB — LACTIC ACID, PLASMA
LACTIC ACID, VENOUS: 0.8 mmol/L (ref 0.5–1.9)
Lactic Acid, Venous: 0.6 mmol/L (ref 0.5–1.9)

## 2017-03-08 LAB — PROCALCITONIN

## 2017-03-08 MED ORDER — CHLORHEXIDINE GLUCONATE CLOTH 2 % EX PADS: 6.0000 | MEDICATED_PAD | Freq: Once | CUTANEOUS | Status: DC

## 2017-03-08 MED ORDER — ACETAMINOPHEN 650 MG RE SUPP: 650.0000 mg | Freq: Four times a day (QID) | RECTAL | Status: DC | PRN

## 2017-03-08 MED ORDER — VANCOMYCIN HCL IN DEXTROSE 1-5 GM/200ML-% IV SOLN: 1000.0000 mg | Freq: Once | INTRAVENOUS | Status: DC

## 2017-03-08 MED ORDER — SODIUM CHLORIDE 0.9 % IV BOLUS (SEPSIS)
1000.0000 mL | Freq: Once | INTRAVENOUS | Status: AC
  Administered 2017-03-08: 1000 mL via INTRAVENOUS

## 2017-03-08 MED ORDER — VANCOMYCIN HCL IN DEXTROSE 1-5 GM/200ML-% IV SOLN
1000.0000 mg | Freq: Three times a day (TID) | INTRAVENOUS | Status: DC
  Administered 2017-03-08 – 2017-03-09 (×3): 1000 mg via INTRAVENOUS
  Filled 2017-03-08 (×4): qty 200

## 2017-03-08 MED ORDER — IOPAMIDOL (ISOVUE-300) INJECTION 61%
INTRAVENOUS | Status: AC
  Administered 2017-03-08: 75 mL
  Filled 2017-03-08: qty 75

## 2017-03-08 MED ORDER — OXYCODONE-ACETAMINOPHEN 5-325 MG PO TABS
1.0000 | ORAL_TABLET | ORAL | Status: AC | PRN
  Administered 2017-03-08 (×2): 1 via ORAL
  Filled 2017-03-08 (×2): qty 1

## 2017-03-08 MED ORDER — CHLORHEXIDINE GLUCONATE CLOTH 2 % EX PADS
6.0000 | MEDICATED_PAD | Freq: Once | CUTANEOUS | Status: AC
  Administered 2017-03-08: 6 via TOPICAL

## 2017-03-08 MED ORDER — VANCOMYCIN HCL 10 G IV SOLR
2000.0000 mg | Freq: Once | INTRAVENOUS | Status: AC
  Administered 2017-03-08: 2000 mg via INTRAVENOUS
  Filled 2017-03-08: qty 2000

## 2017-03-08 MED ORDER — ACETAMINOPHEN 500 MG PO TABS
1000.0000 mg | ORAL_TABLET | ORAL | Status: AC
  Administered 2017-03-09: 1000 mg via ORAL
  Filled 2017-03-08: qty 2

## 2017-03-08 MED ORDER — ONDANSETRON HCL 4 MG PO TABS: 4.0000 mg | ORAL_TABLET | Freq: Four times a day (QID) | ORAL | Status: DC | PRN

## 2017-03-08 MED ORDER — ONDANSETRON HCL 4 MG/2ML IJ SOLN: 4.0000 mg | Freq: Four times a day (QID) | INTRAMUSCULAR | Status: DC | PRN

## 2017-03-08 MED ORDER — INFLUENZA VAC SPLIT QUAD 0.5 ML IM SUSY: 0.5000 mL | PREFILLED_SYRINGE | INTRAMUSCULAR | Status: DC

## 2017-03-08 MED ORDER — DEXTROSE 5 % IV SOLN
3.0000 g | INTRAVENOUS | Status: AC
  Administered 2017-03-09: 3 g via INTRAVENOUS
  Filled 2017-03-08: qty 3

## 2017-03-08 MED ORDER — ACETAMINOPHEN 325 MG PO TABS
650.0000 mg | ORAL_TABLET | Freq: Four times a day (QID) | ORAL | Status: DC | PRN
  Administered 2017-03-08: 650 mg via ORAL
  Filled 2017-03-08: qty 2

## 2017-03-08 MED ORDER — INSULIN ASPART 100 UNIT/ML ~~LOC~~ SOLN
0.0000 [IU] | SUBCUTANEOUS | Status: DC
  Administered 2017-03-08 – 2017-03-09 (×2): 2 [IU] via SUBCUTANEOUS
  Administered 2017-03-09: 1 [IU] via SUBCUTANEOUS
  Administered 2017-03-09: 2 [IU] via SUBCUTANEOUS
  Administered 2017-03-10: 1 [IU] via SUBCUTANEOUS
  Filled 2017-03-08: qty 1

## 2017-03-08 MED ORDER — ABACAVIR-DOLUTEGRAVIR-LAMIVUD 600-50-300 MG PO TABS
1.0000 | ORAL_TABLET | Freq: Every day | ORAL | Status: DC
  Administered 2017-03-10: 1 via ORAL
  Filled 2017-03-08 (×4): qty 1

## 2017-03-08 MED ORDER — PIPERACILLIN-TAZOBACTAM 3.375 G IVPB
3.3750 g | Freq: Three times a day (TID) | INTRAVENOUS | Status: DC
  Administered 2017-03-08 – 2017-03-09 (×4): 3.375 g via INTRAVENOUS
  Filled 2017-03-08 (×5): qty 50

## 2017-03-08 MED ORDER — GABAPENTIN 300 MG PO CAPS
300.0000 mg | ORAL_CAPSULE | ORAL | Status: AC
  Administered 2017-03-09: 300 mg via ORAL
  Filled 2017-03-08: qty 1

## 2017-03-08 MED ORDER — CELECOXIB 200 MG PO CAPS
200.0000 mg | ORAL_CAPSULE | ORAL | Status: AC
  Administered 2017-03-09: 200 mg via ORAL
  Filled 2017-03-08: qty 1

## 2017-03-08 MED ORDER — SODIUM CHLORIDE 0.9 % IV BOLUS (SEPSIS)
500.0000 mL | Freq: Once | INTRAVENOUS | Status: AC
  Administered 2017-03-08: 500 mL via INTRAVENOUS

## 2017-03-08 MED ORDER — POTASSIUM CHLORIDE 10 MEQ/100ML IV SOLN
10.0000 meq | INTRAVENOUS | Status: AC
  Administered 2017-03-08: 10 meq via INTRAVENOUS
  Filled 2017-03-08: qty 100

## 2017-03-08 MED ORDER — SODIUM CHLORIDE 0.9 % IV SOLN
INTRAVENOUS | Status: AC
  Administered 2017-03-08: 09:00:00 via INTRAVENOUS

## 2017-03-08 MED ORDER — PNEUMOCOCCAL VAC POLYVALENT 25 MCG/0.5ML IJ INJ
0.5000 mL | INJECTION | INTRAMUSCULAR | Status: DC
  Filled 2017-03-08: qty 0.5

## 2017-03-08 MED ORDER — PIPERACILLIN-TAZOBACTAM 3.375 G IVPB 30 MIN
3.3750 g | Freq: Once | INTRAVENOUS | Status: AC
  Administered 2017-03-08: 3.375 g via INTRAVENOUS
  Filled 2017-03-08: qty 50

## 2017-03-08 NOTE — ED Notes (Signed)
Pt called out stating that her abscess was leaking. Placed ABD pad over dressing.

## 2017-03-08 NOTE — ED Notes (Signed)
ED Provider at bedside. 

## 2017-03-08 NOTE — ED Notes (Signed)
Patient transported to CT 

## 2017-03-08 NOTE — ED Provider Notes (Signed)
[facility] EMERGENCY DEPARTMENT Provider Note   CSN: [id number] Arrival date & time: 03/07/17  2244     History   Chief Complaint Chief Complaint  Patient presents with  . Abscess  . Cellulitis    HPI Kelly Hunter is a 27 y.o. female.  The history is provided by the patient.  Abscess  Abscess location: left breast  Abscess quality: fluctuance, induration, itching, painful, redness, warmth and weeping   Red streaking: no   Duration:  1 week Progression:  Worsening Pain details:    Quality:  Dull   Severity:  Severe   Duration:  1 week   Timing:  Constant   Progression:  Worsening Chronicity:  New Context: immunosuppression   Relieved by:  Nothing Worsened by:  Nothing Ineffective treatments:  None tried Associated symptoms: no fever and no vomiting   Risk factors: no family hx of MRSA     Past Medical History:  Diagnosis Date  . Acne   . Allergy    Allegra  . Asthma    mild; onset in childhood.  No hospitalizations.  . Chlamydia 03/03/2012  . CIN I (cervical intraepithelial neoplasia I) 08/31/2013   colposcopy by Toney Rakes; s/p CO2 laser treatment.  . Eczema   . Genital warts 08/31/2013   Aldara treatment; Toney Rakes.  Marland Kitchen Herpes   . Hidradenitis   . Hidradenitis axillaris 05/21/2014  . HIV (human immunodeficiency virus infection) ([place])   . Substance abuse ([place])   . Type 2 diabetes mellitus without complication, without long-term current use of insulin ([place]) 11/17/2014  . VIN I (vulvar intraepithelial neoplasia I) 08/31/2013   s/p CO2 treatment; [facility]andez/gyn.    Patient Active Problem List   Diagnosis Date Noted  . Genital herpes 04/27/2016  . Type 2 diabetes mellitus without complication, without long-term current use of insulin ([place]) 11/17/2014  . Hidradenitis axillaris 05/21/2014  . HIV disease ([place]) 01/15/2014  . Asymptomatic HIV infection ([place]) 12/30/2013  . Eczema 10/22/2013  . Rhinitis, allergic 10/22/2013  . Condyloma  acuminatum of vulva 06/18/2013  . VIN I (vulvar intraepithelial neoplasia I) 06/18/2013  . Dysplasia of cervix, low grade (CIN 1) 06/18/2013  . ASCUS with positive high risk HPV 06/04/2013  . Obesity 03/30/2006  . ASTHMA, EXERCISE INDUCED 03/30/2006  . ACNE 03/30/2006    Past Surgical History:  Procedure Laterality Date  . BREAST SURGERY  02/01/2008   Reduction  . CO2 LASER APPLICATION N/A 6/64/4034   Procedure: C02 laser of vagina and cervix;  Surgeon: Terrance Mass, MD;  Location: [facility];  Service: Gynecology;  Laterality: N/A;    OB History    Gravida Para Term Preterm AB Living   0             SAB TAB Ectopic Multiple Live Births                   Home Medications    Prior to Admission medications   Medication Sig Start Date End Date Taking? Authorizing Provider  abacavir-dolutegravir-lamiVUDine (TRIUMEQ) 600-50-300 MG tablet TAKE ONE TABLET BY MOUTH ONCE DAILY. STORE IN ORIGINAL BOTTLE AT [facility]. 10/10/16   Tommy Medal, Lavell Islam, MD  albuterol (PROVENTIL HFA;VENTOLIN HFA) 108 [phone] Base) MCG/ACT inhaler Inhale 2 puffs into the lungs every 6 (six) hours as needed. 04/29/15   [name], MD  AMBULATORY NON FORMULARY MEDICATION Boric Acid Suppository 600 mg Insert 1 PV twice weekly to maintain vaginal flora 02/25/16   Reginia Forts  M, MD  blood glucose meter kit and supplies KIT Dispense based on patient and insurance preference. Use up to three times daily as directed. (FOR ICD-10 E11.9) Patient not taking: Reported on 08/01/2016 11/26/14   [name], MD  imiquimod [place]) 5 % cream Apply topically 3 (three) times a week. 04/29/15   [name], MD  Triamcinolone Acetonide (TRIAMCINOLONE 0.1 % CREAM : EUCERIN) CREA Use as directed 06/18/14   [name], MD  triamcinolone cream (KENALOG) 0.1 % APPLY AS DIRECTED 03/22/16   Weber, Damaris Hippo, PA-C  valACYclovir (VALTREX) 1000 MG tablet TAKE 1 TABLET (1,000 MG TOTAL) BY MOUTH DAILY. 12/05/16   Truman Hayward, MD    Family History Family History  Problem Relation Age of Onset  . Diabetes Mother   . Hypertension Mother   . Hyperlipidemia Mother   . Mental illness Mother   . Cancer Maternal Grandfather        ?    Social History Social History   Tobacco Use  . Smoking status: Never Smoker  . Smokeless tobacco: Never Used  Substance Use Topics  . Alcohol use: Yes    Alcohol/week: 0.0 oz    Comment: socially  . Drug use: No     Allergies   Other and Peanut-containing drug products   Review of Systems Review of Systems  Constitutional: Negative for fever.  HENT: Negative for facial swelling.   Respiratory: Negative for shortness of breath.   Cardiovascular: Negative for chest pain.  Gastrointestinal: Negative for abdominal pain and vomiting.  Skin: Positive for color change.  All other systems reviewed and are negative.    Physical Exam Updated Vital Signs BP 111/71   Pulse (!) 109   Temp 99.9 F (37.7 C) (Oral)   Resp 18   LMP 03/01/2017   SpO2 100%   Physical Exam  Constitutional: She is oriented to person, place, and time. She appears well-developed and well-nourished. No distress.  HENT:  Head: Normocephalic and atraumatic.  Mouth/Throat: No oropharyngeal exudate.  Eyes: Conjunctivae are normal. Pupils are equal, round, and reactive to light.  Neck: Normal range of motion. Neck supple.  Cardiovascular: Normal rate, regular rhythm, normal heart sounds and intact distal pulses.  Pulmonary/Chest: Effort normal and breath sounds normal. She exhibits tenderness.  Chaperone present     Abdominal: Soft. She exhibits no mass. There is no tenderness. There is no rebound and no guarding.  Musculoskeletal: Normal range of motion.  Neurological: She is alert and oriented to person, place, and time.  Skin: Skin is dry. Capillary refill takes less than 2 seconds. There is erythema.  Psychiatric: She has a normal mood and affect.     ED Treatments / Results    Labs (all labs ordered are listed, but only abnormal results are displayed) Results for orders placed or performed during the hospital encounter of 03/08/17  Comprehensive metabolic panel  Result Value Ref Range   Sodium 137 135 - 145 mmol/L   Potassium 3.7 3.5 - 5.1 mmol/L   Chloride 101 101 - 111 mmol/L   CO2 23 22 - 32 mmol/L   Glucose, Bld 154 (H) 65 - 99 mg/dL   BUN 6 6 - 20 mg/dL   Creatinine, Ser 0.79 0.44 - 1.00 mg/dL   Calcium 9.3 8.9 - 10.3 mg/dL   Total Protein 8.0 6.5 - 8.1 g/dL   Albumin 3.6 3.5 - 5.0 g/dL   AST 14 (L) 15 - 41 U/L  ALT 12 (L) 14 - 54 U/L   Alkaline Phosphatase 55 38 - 126 U/L   Total Bilirubin 0.7 0.3 - 1.2 mg/dL   GFR calc non Af Amer >60 >60 mL/min   GFR calc Af Amer >60 >60 mL/min   Anion gap 13 5 - 15  CBC with Differential  Result Value Ref Range   WBC 19.0 (H) 4.0 - 10.5 K/uL   RBC 4.05 3.87 - 5.11 MIL/uL   Hemoglobin 11.9 (L) 12.0 - 15.0 g/dL   HCT 33.6 (L) 36.0 - 46.0 %   MCV 83.0 78.0 - 100.0 fL   MCH 29.4 26.0 - 34.0 pg   MCHC 35.4 30.0 - 36.0 g/dL   RDW 13.7 11.5 - 15.5 %   Platelets 407 (H) 150 - 400 K/uL   Neutrophils Relative % 76 %   Neutro Abs 14.5 (H) 1.7 - 7.7 K/uL   Lymphocytes Relative 16 %   Lymphs Abs 3.1 0.7 - 4.0 K/uL   Monocytes Relative 7 %   Monocytes Absolute 1.2 (H) 0.1 - 1.0 K/uL   Eosinophils Relative 1 %   Eosinophils Absolute 0.1 0.0 - 0.7 K/uL   Basophils Relative 0 %   Basophils Absolute 0.0 0.0 - 0.1 K/uL  Urinalysis, Routine w reflex microscopic  Result Value Ref Range   Color, Urine YELLOW YELLOW   APPearance HAZY (A) CLEAR   Specific Gravity, Urine 1.025 1.005 - 1.030   pH 5.0 5.0 - 8.0   Glucose, UA NEGATIVE NEGATIVE mg/dL   Hgb urine dipstick MODERATE (A) NEGATIVE   Bilirubin Urine NEGATIVE NEGATIVE   Ketones, ur 5 (A) NEGATIVE mg/dL   Protein, ur 30 (A) NEGATIVE mg/dL   Nitrite NEGATIVE NEGATIVE   Leukocytes, UA LARGE (A) NEGATIVE   RBC / HPF 0-5 0 - 5 RBC/hpf   WBC, UA 6-30 0 - 5  WBC/hpf   Bacteria, UA RARE (A) NONE SEEN   Squamous Epithelial / LPF 6-30 (A) NONE SEEN   Mucus PRESENT   I-Stat CG4 Lactic Acid, ED  Result Value Ref Range   Lactic Acid, Venous 2.49 (HH) 0.5 - 1.9 mmol/L   Comment NOTIFIED PHYSICIAN   I-Stat beta hCG blood, ED  Result Value Ref Range   I-stat hCG, quantitative <5.0 <5 mIU/mL   Comment 3           Dg Chest Port 1 View  Result Date: 03/08/2017 CLINICAL DATA:  Left breast swelling EXAM: PORTABLE CHEST 1 VIEW COMPARISON:  04/29/2015 FINDINGS: The heart size and mediastinal contours are within normal limits. Both lungs are clear. The visualized skeletal structures are unremarkable. Mildly low lung volume IMPRESSION: No active disease. Electronically Signed   By: Donavan Foil M.D.   On: 03/08/2017 02:32    EKG  EKG Interpretation  Date/Time:  Wednesday March 08 2017 02:28:08 EST Ventricular Rate:  100 PR Interval:    QRS Duration: 87 QT Interval:  323 QTC Calculation: 417 R Axis:   13 Text Interpretation:  Sinus tachycardia Confirmed by Dory Horn) on 03/08/2017 2:41:10 AM       Radiology Dg Chest Port 1 View  Result Date: 03/08/2017 CLINICAL DATA:  Left breast swelling EXAM: PORTABLE CHEST 1 VIEW COMPARISON:  04/29/2015 FINDINGS: The heart size and mediastinal contours are within normal limits. Both lungs are clear. The visualized skeletal structures are unremarkable. Mildly low lung volume IMPRESSION: No active disease. Electronically Signed   By: Donavan Foil M.D.   On: 03/08/2017 02:32  Procedures Procedures (including critical care time)  Medications Ordered in ED Medications  oxyCODONE-acetaminophen (PERCOCET/ROXICET) 5-325 MG per tablet 1 tablet (1 tablet Oral Given 03/08/17 0053)  sodium chloride 0.9 % bolus 1,000 mL (1,000 mLs Intravenous New Bag/Given 03/08/17 0230)    And  sodium chloride 0.9 % bolus 1,000 mL (not administered)    And  sodium chloride 0.9 % bolus 1,000 mL (not administered)    And    sodium chloride 0.9 % bolus 500 mL (not administered)  piperacillin-tazobactam (ZOSYN) IVPB 3.375 g (not administered)  vancomycin (VANCOCIN) 2,000 mg in sodium chloride 0.9 % 500 mL IVPB (not administered)     Case d/w Dr. Kieth Brightly, please obtain CT of the chest to evaluation the breast abscess   MDM Reviewed: previous chart, nursing note and vitals Reviewed previous: labs Interpretation: labs and ECG (wbc 19, elevated lactate NACPD  ) Total time providing critical care: 75-105 minutes. This excludes time spent performing separately reportable procedures and services. Consults: admitting MD and general surgery    CRITICAL CARE Performed by: Carlisle Beers Total critical care time: 75  minutes Critical care time was exclusive of separately billable procedures and treating other patients. Critical care was necessary to treat or prevent imminent or life-threatening deterioration. Critical care was time spent personally by me on the following activities: development of treatment plan with patient and/or surrogate as well as nursing, discussions with consultants, evaluation of patient's response to treatment, examination of patient, obtaining history from patient or surrogate, ordering and performing treatments and interventions, ordering and review of laboratory studies, ordering and review of radiographic studies, pulse oximetry and re-evaluation of patient's condition.  Final Clinical Impressions(s) / ED Diagnoses   Will admit to medicine for IV antibiotics as patient met sepsis criteria.     Latavius Capizzi, MD 03/08/17 [phone]

## 2017-03-08 NOTE — ED Notes (Signed)
Regular diet breakfast tray ordered @ [phone]

## 2017-03-08 NOTE — ED Notes (Signed)
X-ray at bedside

## 2017-03-08 NOTE — Consult Note (Signed)
Reason for Consult: skin abscess Referring Physician: April Palumbo  Kelly Hunter is an 27 y.o. female.  HPI: 27 yo female with HIV had a 10 day history of pain and swelling in her left inframammary area. Recently it started draining. She denies fevers or chills. She has never had an abscess drained but has had other skin areas swell and then shrink or spontaneously rupture. She does not smoke.  Past Medical History:  Diagnosis Date  . Acne   . Allergy    Allegra  . Asthma    mild; onset in childhood.  No hospitalizations.  . Chlamydia 03/03/2012  . CIN I (cervical intraepithelial neoplasia I) 08/31/2013   colposcopy by Toney Rakes; s/p CO2 laser treatment.  . Eczema   . Genital warts 08/31/2013   Aldara treatment; Toney Rakes.  Marland Kitchen Herpes   . Hidradenitis   . Hidradenitis axillaris 05/21/2014  . HIV (human immunodeficiency virus infection) ([place])   . Substance abuse ([place])   . Type 2 diabetes mellitus without complication, without long-term current use of insulin ([place]) 11/17/2014  . VIN I (vulvar intraepithelial neoplasia I) 08/31/2013   s/p CO2 treatment; Fernandez/gyn.    Past Surgical History:  Procedure Laterality Date  . BREAST SURGERY  02/01/2008   Reduction  . CO2 LASER APPLICATION N/A 7/41/4239   Procedure: C02 laser of vagina and cervix;  Surgeon: Terrance Mass, MD;  Location: [facility];  Service: Gynecology;  Laterality: N/A;    Family History  Problem Relation Age of Onset  . Diabetes Mother   . Hypertension Mother   . Hyperlipidemia Mother   . Mental illness Mother   . Cancer Maternal Grandfather        ?    Social History:  reports that  has never smoked. she has never used smokeless tobacco. She reports that she drinks alcohol. She reports that she does not use drugs.  Allergies:  Allergies  Allergen Reactions  . Other Nausea And Vomiting and Swelling    Tree Nuts (Almonds)  . Peanut-Containing Drug Products Nausea And Vomiting and Swelling    Medications: I  have reviewed the patient's current medications.  Results for orders placed or performed during the hospital encounter of 03/08/17 (from the past 48 hour(s))  Urinalysis, Routine w reflex microscopic     Status: Abnormal   Collection Time: 03/07/17 10:54 PM  Result Value Ref Range   Color, Urine YELLOW YELLOW   APPearance HAZY (A) CLEAR   Specific Gravity, Urine 1.025 1.005 - 1.030   pH 5.0 5.0 - 8.0   Glucose, UA NEGATIVE NEGATIVE mg/dL   Hgb urine dipstick MODERATE (A) NEGATIVE   Bilirubin Urine NEGATIVE NEGATIVE   Ketones, ur 5 (A) NEGATIVE mg/dL   Protein, ur 30 (A) NEGATIVE mg/dL   Nitrite NEGATIVE NEGATIVE   Leukocytes, UA LARGE (A) NEGATIVE   RBC / HPF 0-5 0 - 5 RBC/hpf   WBC, UA 6-30 0 - 5 WBC/hpf   Bacteria, UA RARE (A) NONE SEEN   Squamous Epithelial / LPF 6-30 (A) NONE SEEN   Mucus PRESENT     Comment: Performed at [facility], [address] [place], [place] [address]  Comprehensive metabolic panel     Status: Abnormal   Collection Time: 03/07/17 11:00 PM  Result Value Ref Range   Sodium 137 135 - 145 mmol/L   Potassium 3.7 3.5 - 5.1 mmol/L   Chloride 101 101 - 111 mmol/L   CO2 23 22 - 32  mmol/L   Glucose, Bld 154 (H) 65 - 99 mg/dL   BUN 6 6 - 20 mg/dL   Creatinine, Ser 0.79 0.44 - 1.00 mg/dL   Calcium 9.3 8.9 - 10.3 mg/dL   Total Protein 8.0 6.5 - 8.1 g/dL   Albumin 3.6 3.5 - 5.0 g/dL   AST 14 (L) 15 - 41 U/L   ALT 12 (L) 14 - 54 U/L   Alkaline Phosphatase 55 38 - 126 U/L   Total Bilirubin 0.7 0.3 - 1.2 mg/dL   GFR calc non Af Amer >60 >60 mL/min   GFR calc Af Amer >60 >60 mL/min    Comment: (NOTE) The eGFR has been calculated using the CKD EPI equation. This calculation has not been validated in all clinical situations. eGFR's persistently <60 mL/min signify possible Chronic Kidney Disease.    Anion gap 13 5 - 15    Comment: Performed at [facility] [address], [place], [place] [address]  CBC with Differential     Status: Abnormal    Collection Time: 03/07/17 11:00 PM  Result Value Ref Range   WBC 19.0 (H) 4.0 - 10.5 K/uL   RBC 4.05 3.87 - 5.11 MIL/uL   Hemoglobin 11.9 (L) 12.0 - 15.0 g/dL   HCT 33.6 (L) 36.0 - 46.0 %   MCV 83.0 78.0 - 100.0 fL   MCH 29.4 26.0 - 34.0 pg   MCHC 35.4 30.0 - 36.0 g/dL   RDW 13.7 11.5 - 15.5 %   Platelets 407 (H) 150 - 400 K/uL   Neutrophils Relative % 76 %   Neutro Abs 14.5 (H) 1.7 - 7.7 K/uL   Lymphocytes Relative 16 %   Lymphs Abs 3.1 0.7 - 4.0 K/uL   Monocytes Relative 7 %   Monocytes Absolute 1.2 (H) 0.1 - 1.0 K/uL   Eosinophils Relative 1 %   Eosinophils Absolute 0.1 0.0 - 0.7 K/uL   Basophils Relative 0 %   Basophils Absolute 0.0 0.0 - 0.1 K/uL    Comment: Performed at [facility], [place] [address], [place], [place] [address]  I-Stat beta hCG blood, ED     Status: None   Collection Time: 03/07/17 11:30 PM  Result Value Ref Range   I-stat hCG, quantitative <5.0 <5 mIU/mL   Comment 3            Comment:   GEST. AGE      CONC.  (mIU/mL)   <=1 WEEK        5 - 50     2 WEEKS       50 - 500     3 WEEKS       100 - 10,000     4 WEEKS     1,000 - 30,000        FEMALE AND NON-PREGNANT FEMALE:     LESS THAN 5 mIU/mL   I-Stat CG4 Lactic Acid, ED     Status: Abnormal   Collection Time: 03/08/17 12:58 AM  Result Value Ref Range   Lactic Acid, Venous 2.49 (HH) 0.5 - 1.9 mmol/L   Comment NOTIFIED PHYSICIAN   CBC with Differential     Status: Abnormal   Collection Time: 03/08/17  4:59 AM  Result Value Ref Range   WBC 16.7 (H) 4.0 - 10.5 K/uL   RBC 3.45 (L) 3.87 - 5.11 MIL/uL   Hemoglobin 9.9 (L) 12.0 - 15.0 g/dL   HCT 28.5 (L) 36.0 - 46.0 %  MCV 82.6 78.0 - 100.0 fL   MCH 28.7 26.0 - 34.0 pg   MCHC 34.7 30.0 - 36.0 g/dL   RDW 13.8 11.5 - 15.5 %   Platelets 355 150 - 400 K/uL   Neutrophils Relative % 68 %   Lymphocytes Relative 23 %   Monocytes Relative 8 %   Eosinophils Relative 1 %   Basophils Relative 0 %   Neutro Abs 11.4 (H) 1.7 - 7.7 K/uL   Lymphs Abs 3.8  0.7 - 4.0 K/uL   Monocytes Absolute 1.3 (H) 0.1 - 1.0 K/uL   Eosinophils Absolute 0.2 0.0 - 0.7 K/uL   Basophils Absolute 0.0 0.0 - 0.1 K/uL   WBC Morphology ATYPICAL LYMPHOCYTES     Comment: Performed at [facility] [address], [place], [place] [address]  Comprehensive metabolic panel     Status: Abnormal   Collection Time: 03/08/17  4:59 AM  Result Value Ref Range   Sodium 137 135 - 145 mmol/L   Potassium 3.4 (L) 3.5 - 5.1 mmol/L   Chloride 104 101 - 111 mmol/L   CO2 22 22 - 32 mmol/L   Glucose, Bld 109 (H) 65 - 99 mg/dL   BUN 6 6 - 20 mg/dL   Creatinine, Ser 0.81 0.44 - 1.00 mg/dL   Calcium 7.9 (L) 8.9 - 10.3 mg/dL   Total Protein 6.2 (L) 6.5 - 8.1 g/dL   Albumin 2.9 (L) 3.5 - 5.0 g/dL   AST 12 (L) 15 - 41 U/L   ALT 11 (L) 14 - 54 U/L   Alkaline Phosphatase 44 38 - 126 U/L   Total Bilirubin 0.6 0.3 - 1.2 mg/dL   GFR calc non Af Amer >60 >60 mL/min   GFR calc Af Amer >60 >60 mL/min    Comment: (NOTE) The eGFR has been calculated using the CKD EPI equation. This calculation has not been validated in all clinical situations. eGFR's persistently <60 mL/min signify possible Chronic Kidney Disease.    Anion gap 11 5 - 15    Comment: Performed at [facility] [address] [place], [place] [address]  Lactic acid, plasma     Status: None   Collection Time: 03/08/17  4:59 AM  Result Value Ref Range   Lactic Acid, Venous 0.8 0.5 - 1.9 mmol/L    Comment: Performed at [facility] [address] [place], [place] [address]  Protime-INR     Status: None   Collection Time: 03/08/17  4:59 AM  Result Value Ref Range   Prothrombin Time 14.8 11.4 - 15.2 seconds   INR 1.17     Comment: Performed at [facility] [address] [place], [place] [address]  APTT     Status: None   Collection Time: 03/08/17  4:59 AM  Result Value Ref Range   aPTT 36 24 - 36 seconds    Comment: Performed at [facility] [address], [place], [place] [address]   Type and screen [facility]     Status: None (Preliminary result)   Collection Time: 03/08/17  5:00 AM  Result Value Ref Range   ABO/RH(D) B POS    Antibody Screen PENDING    Sample Expiration      03/11/2017 Performed at [facility], [place] [address] [place], [place] [address]   CBG monitoring, ED     Status: Abnormal   Collection Time: 03/08/17  5:42 AM  Result Value Ref Range   Glucose-Capillary 105 (H) 65 - 99 mg/dL    Ct Chest W Contrast  Result Date: 03/08/2017 CLINICAL DATA:  Acute onset of left breast swelling and abscess. EXAM: CT CHEST WITH CONTRAST TECHNIQUE: Multidetector CT imaging of the chest was performed during intravenous contrast administration. CONTRAST:  4m ISOVUE-300 IOPAMIDOL (ISOVUE-300) INJECTION 61% COMPARISON:  Chest radiograph performed earlier today at 2:07 a.m. FINDINGS: Cardiovascular: The heart is unremarkable in appearance. The thoracic aorta is within normal limits. The great vessels are within normal limits. Mediastinum/Nodes: The mediastinum is unremarkable in appearance. No mediastinal lymphadenopathy is seen. No pericardial effusion is identified. The visualized portions of the thyroid gland are unremarkable. There is asymmetric prominence of left axillary nodes, measuring up to 1.8 cm in short axis. Lungs/Pleura: The lungs are clear bilaterally. No focal consolidation, pleural effusion or pneumothorax is seen. No masses are identified. Upper Abdomen: The visualized portions of the liver and spleen are unremarkable. The visualized portions of the pancreas, adrenal glands and kidneys are within normal limits. Musculoskeletal: No acute osseous abnormalities are identified. The visualized musculature is unremarkable in appearance. There is diffuse skin thickening along much of the left breast, as well as a contiguous underlying heterogeneous mass at the medial aspect of the left breast, measuring approximately 5.3 x 4.5 x 2.9 cm.  IMPRESSION: 1. Diffuse skin thickening along much of the left breast, as well as a contiguous underlying heterogeneous mass at the medial aspect of the left breast, measuring 5.3 x 4.5 x 2.9 cm. Though this could reflect diffuse cellulitis and underlying poorly characterized abscess as clinically suspected, would correlate clinically to exclude inflammatory breast cancer. 2. Asymmetric prominence of left axillary nodes, measuring up to 1.8 cm in short axis. Electronically Signed   By: JGarald BaldingM.D.   On: 03/08/2017 04:10   Dg Chest Port 1 View  Result Date: 03/08/2017 CLINICAL DATA:  Left breast swelling EXAM: PORTABLE CHEST 1 VIEW COMPARISON:  04/29/2015 FINDINGS: The heart size and mediastinal contours are within normal limits. Both lungs are clear. The visualized skeletal structures are unremarkable. Mildly low lung volume IMPRESSION: No active disease. Electronically Signed   By: KDonavan FoilM.D.   On: 03/08/2017 02:32    Review of Systems  Constitutional: Negative for chills and fever.  HENT: Negative for hearing loss.   Eyes: Negative for blurred vision and double vision.  Respiratory: Negative for cough and hemoptysis.   Cardiovascular: Negative for chest pain and palpitations.  Gastrointestinal: Negative for abdominal pain, nausea and vomiting.  Genitourinary: Negative for dysuria and urgency.  Musculoskeletal: Negative for myalgias and neck pain.  Skin: Positive for rash. Negative for itching.  Neurological: Negative for dizziness, tingling and headaches.  Endo/Heme/Allergies: Does not bruise/bleed easily.  Psychiatric/Behavioral: Negative for depression and suicidal ideas.   Blood pressure 117/71, pulse (!) 114, temperature 99.9 F (37.7 C), temperature source Oral, resp. rate (!) 33, last menstrual period 03/01/2017, SpO2 100 %. Physical Exam  Vitals reviewed. Constitutional: She is oriented to person, place, and time. She appears well-developed and well-nourished.  HENT:   Head: Normocephalic and atraumatic.  Eyes: Conjunctivae and EOM are normal. Pupils are equal, round, and reactive to light.  Neck: Normal range of motion. Neck supple.  Cardiovascular: Normal rate and regular rhythm.  Respiratory: Effort normal and breath sounds normal.  GI: Soft. Bowel sounds are normal. She exhibits no distension. There is no tenderness.  Musculoskeletal: Normal range of motion.  Neurological: She is alert  and oriented to person, place, and time.  Skin: Skin is warm and dry.  Left inframammary area with medial aspect with purulent drainage and skin changes over a 8cm area  Psychiatric: She has a normal mood and affect. Her behavior is normal.    Assessment/Plan: 27 yo female with HIV and history of bilateral breast reductions presents with left inframammary abscess confirmed on CT scan -admit to medicine -IV antibiotics -planned abscess drainage  Kelly Hunter 03/08/2017, 6:48 AM

## 2017-03-08 NOTE — Progress Notes (Signed)
Pharmacy Antibiotic Note  Kelly Hunter is a 27 y.o. HIV + female admitted on 03/08/2017 with L beast abcess/sepsis.  Pharmacy has been consulted for Vancomycin and Zosyn  Dosing.  Vancomycin 2 g IV given in ED at 0430    Plan: Vancomycin 1 g IV q8h Zosyn 3.375 g IV q8h      Temp (24hrs), Avg:99.9 F (37.7 C), Min:99.9 F (37.7 C), Max:99.9 F (37.7 C)  Recent Labs  Lab 03/07/17 2300 03/08/17 0058  WBC 19.0*  --   CREATININE 0.79  --   LATICACIDVEN  --  2.49*    CrCl cannot be calculated (Unknown ideal weight.).    Allergies  Allergen Reactions  . Other Nausea And Vomiting and Swelling    Tree Nuts (Almonds)  . Peanut-Containing Drug Products     Caryl Pina 03/08/2017 4:52 AM

## 2017-03-08 NOTE — Progress Notes (Signed)
OK TO FEED  Will take to OR tomorrow for washout of left breast wound/abscess NPO after midnight

## 2017-03-08 NOTE — H&P (Signed)
History and Physical    Kelly Hunter MLY:[id number] DOB: January 31, 1991 DOA: 03/08/2017  PCP: [name], MD  Patient coming from: Home.  Chief Complaint: Left breast pain.  HPI: Kelly Hunter is a 27 y.o. female with history of HIV and previous history of diabetes off medications after patient's diabetes improved with weight loss presents to the ER with complaints of increasing pain in the left breast area.  Pain is mostly in the inferior aspect of her left breast.  Denies any discharge.  Denies any trauma or insect bite.  Denies any chest pain shortness of breath.  Has been compliant with her antiretroviral.  ED Course: In the ER patient was found to be febrile tachycardic lactate levels were elevated.  On exam patient had features concerning for left breast abscess and CT scan was done which was cellulitis with possible abscess.  On-call general surgeon Dr. Kieth Brightly was consulted.  Patient started on empiric antibiotics after cultures obtained.  Patient admitted for further management.  Review of Systems: As per HPI, rest all negative.   Past Medical History:  Diagnosis Date  . Acne   . Allergy    Allegra  . Asthma    mild; onset in childhood.  No hospitalizations.  . Chlamydia 03/03/2012  . CIN I (cervical intraepithelial neoplasia I) 08/31/2013   colposcopy by Toney Rakes; s/p CO2 laser treatment.  . Eczema   . Genital warts 08/31/2013   Aldara treatment; Toney Rakes.  Marland Kitchen Herpes   . Hidradenitis   . Hidradenitis axillaris 05/21/2014  . HIV (human immunodeficiency virus infection) ([place])   . Substance abuse ([place])   . Type 2 diabetes mellitus without complication, without long-term current use of insulin ([place]) 11/17/2014  . VIN I (vulvar intraepithelial neoplasia I) 08/31/2013   s/p CO2 treatment; Fernandez/gyn.    Past Surgical History:  Procedure Laterality Date  . BREAST SURGERY  02/01/2008   Reduction  . CO2 LASER APPLICATION N/A 09/12/7515   Procedure: C02 laser of  vagina and cervix;  Surgeon: Terrance Mass, MD;  Location: [facility];  Service: Gynecology;  Laterality: N/A;     reports that  has never smoked. she has never used smokeless tobacco. She reports that she drinks alcohol. She reports that she does not use drugs.  Allergies  Allergen Reactions  . Other Nausea And Vomiting and Swelling    Tree Nuts (Almonds)  . Peanut-Containing Drug Products     Family History  Problem Relation Age of Onset  . Diabetes Mother   . Hypertension Mother   . Hyperlipidemia Mother   . Mental illness Mother   . Cancer Maternal Grandfather        ?    Prior to Admission medications   Medication Sig Start Date End Date Taking? Authorizing Provider  abacavir-dolutegravir-lamiVUDine (TRIUMEQ) 600-50-300 MG tablet TAKE ONE TABLET BY MOUTH ONCE DAILY. STORE IN ORIGINAL BOTTLE AT [facility]. 10/10/16   Tommy Medal, Lavell Islam, MD  albuterol (PROVENTIL HFA;VENTOLIN HFA) [phone] Base) MCG/ACT inhaler Inhale 2 puffs into the lungs every 6 (six) hours as needed. 04/29/15   [name], MD  AMBULATORY NON FORMULARY MEDICATION Boric Acid Suppository 600 mg Insert 1 PV twice weekly to maintain vaginal flora 02/25/16   [name], MD  blood glucose meter kit and supplies KIT Dispense based on patient and insurance preference. Use up to three times daily as directed. (FOR ICD-10 E11.9) Patient not taking: Reported on 08/01/2016 11/26/14   Reginia Forts  M, MD  imiquimod (ALDARA) 5 % cream Apply topically 3 (three) times a week. 04/29/15   Smith, Kristi M, MD  Triamcinolone Acetonide (TRIAMCINOLONE 0.1 % CREAM : EUCERIN) CREA Use as directed 06/18/14   Smith, Kristi M, MD  triamcinolone cream (KENALOG) 0.1 % APPLY AS DIRECTED 03/22/16   Weber, Sarah L, PA-C  valACYclovir (VALTREX) 1000 MG tablet TAKE 1 TABLET (1,000 MG TOTAL) BY MOUTH DAILY. 12/05/16   Van Dam, Cornelius N, MD    Physical Exam: Vitals:   03/08/17 0245 03/08/17 0300 03/08/17 0315 03/08/17 0440  BP:  115/73 119/75 123/74 116/78  Pulse: (!) 106 94 98 (!) 106  Resp: (!) 26 (!) 29 19 19  Temp:      TempSrc:      SpO2: 100% 99% 100% 100%      Constitutional: Moderately built and nourished. Vitals:   03/08/17 0245 03/08/17 0300 03/08/17 0315 03/08/17 0440  BP: 115/73 119/75 123/74 116/78  Pulse: (!) 106 94 98 (!) 106  Resp: (!) 26 (!) 29 19 19  Temp:      TempSrc:      SpO2: 100% 99% 100% 100%   Eyes: Anicteric no pallor. ENMT: No discharge from the ears eyes nose or mouth. Neck: No mass felt.  No neck rigidity. Respiratory: No rhonchi or crepitations. Cardiovascular: S1-S2 heard tachycardic. Abdomen: Soft nontender bowel sounds present. Musculoskeletal: No edema.  No joint effusion. Skin: Left breast no obvious skin erythema seen. Neurologic: Alert awake oriented to time place and person.  Moves all extremities. Psychiatric: Appears normal.  Normal affect.   Labs on Admission: I have personally reviewed following labs and imaging studies  CBC: Recent Labs  Lab 03/07/17 2300  WBC 19.0*  NEUTROABS 14.5*  HGB 11.9*  HCT 33.6*  MCV 83.0  PLT 407*   Basic Metabolic Panel: Recent Labs  Lab 03/07/17 2300  NA 137  K 3.7  CL 101  CO2 23  GLUCOSE 154*  BUN 6  CREATININE 0.79  CALCIUM 9.3   GFR: CrCl cannot be calculated (Unknown ideal weight.). Liver Function Tests: Recent Labs  Lab 03/07/17 2300  AST 14*  ALT 12*  ALKPHOS 55  BILITOT 0.7  PROT 8.0  ALBUMIN 3.6   No results for input(s): LIPASE, AMYLASE in the last 168 hours. No results for input(s): AMMONIA in the last 168 hours. Coagulation Profile: No results for input(s): INR, PROTIME in the last 168 hours. Cardiac Enzymes: No results for input(s): CKTOTAL, CKMB, CKMBINDEX, TROPONINI in the last 168 hours. BNP (last 3 results) No results for input(s): PROBNP in the last 8760 hours. HbA1C: No results for input(s): HGBA1C in the last 72 hours. CBG: No results for input(s): GLUCAP in the last  168 hours. Lipid Profile: No results for input(s): CHOL, HDL, LDLCALC, TRIG, CHOLHDL, LDLDIRECT in the last 72 hours. Thyroid Function Tests: No results for input(s): TSH, T4TOTAL, FREET4, T3FREE, THYROIDAB in the last 72 hours. Anemia Panel: No results for input(s): VITAMINB12, FOLATE, FERRITIN, TIBC, IRON, RETICCTPCT in the last 72 hours. Urine analysis:    Component Value Date/Time   COLORURINE YELLOW 03/07/2017 2254   APPEARANCEUR HAZY (A) 03/07/2017 2254   LABSPEC 1.025 03/07/2017 2254   PHURINE 5.0 03/07/2017 2254   GLUCOSEU NEGATIVE 03/07/2017 2254   HGBUR MODERATE (A) 03/07/2017 2254   BILIRUBINUR NEGATIVE 03/07/2017 2254   BILIRUBINUR Negative 06/18/2014 1607   KETONESUR 5 (A) 03/07/2017 2254   PROTEINUR 30 (A) 03/07/2017 2254   UROBILINOGEN 0.2 06/18/2014   [place] 1 12/31/2013 1726   NITRITE NEGATIVE 03/07/2017 2254   LEUKOCYTESUR LARGE (A) 03/07/2017 2254   Sepsis Labs: _0 (procalcitonin:4,lacticidven:4) )No results found for this or any previous visit (from the past 240 hour(s)).   Radiological Exams on Admission: Ct Chest W Contrast  Result Date: 03/08/2017 CLINICAL DATA:  Acute onset of left breast swelling and abscess. EXAM: CT CHEST WITH CONTRAST TECHNIQUE: Multidetector CT imaging of the chest was performed during intravenous contrast administration. CONTRAST:  37m ISOVUE-300 IOPAMIDOL (ISOVUE-300) INJECTION 61% COMPARISON:  Chest radiograph performed earlier today at 2:07 a.m. FINDINGS: Cardiovascular: The heart is unremarkable in appearance. The thoracic aorta is within normal limits. The great vessels are within normal limits. Mediastinum/Nodes: The mediastinum is unremarkable in appearance. No mediastinal lymphadenopathy is seen. No pericardial effusion is identified. The visualized portions of the thyroid gland are unremarkable. There is asymmetric prominence of left axillary nodes, measuring up to 1.8 cm in short axis. Lungs/Pleura: The lungs  are clear bilaterally. No focal consolidation, pleural effusion or pneumothorax is seen. No masses are identified. Upper Abdomen: The visualized portions of the liver and spleen are unremarkable. The visualized portions of the pancreas, adrenal glands and kidneys are within normal limits. Musculoskeletal: No acute osseous abnormalities are identified. The visualized musculature is unremarkable in appearance. There is diffuse skin thickening along much of the left breast, as well as a contiguous underlying heterogeneous mass at the medial aspect of the left breast, measuring approximately 5.3 x 4.5 x 2.9 cm. IMPRESSION: 1. Diffuse skin thickening along much of the left breast, as well as a contiguous underlying heterogeneous mass at the medial aspect of the left breast, measuring 5.3 x 4.5 x 2.9 cm. Though this could reflect diffuse cellulitis and underlying poorly characterized abscess as clinically suspected, would correlate clinically to exclude inflammatory breast cancer. 2. Asymmetric prominence of left axillary nodes, measuring up to 1.8 cm in short axis. Electronically Signed   By: JGarald BaldingM.D.   On: 03/08/2017 04:10   Dg Chest Port 1 View  Result Date: 03/08/2017 CLINICAL DATA:  Left breast swelling EXAM: PORTABLE CHEST 1 VIEW COMPARISON:  04/29/2015 FINDINGS: The heart size and mediastinal contours are within normal limits. Both lungs are clear. The visualized skeletal structures are unremarkable. Mildly low lung volume IMPRESSION: No active disease. Electronically Signed   By: KDonavan FoilM.D.   On: 03/08/2017 02:32     Assessment/Plan Principal Problem:   Sepsis ([place] Active Problems:   HIV disease ([place]   Type 2 diabetes mellitus without complication, without long-term current use of insulin ([facility])   Breast abscess    1. Sepsis from left breast abscess -patient has been placed on empiric antibiotics follow cultures general surgeon has been consulted possibly will need a incision  and drainage.  Patient will be kept n.p.o. in anticipation of procedure.  Continue with hydration follow lactate levels and pro calcitonin levels. 2. History of HIV last CD4 count in last June 2018 was around 2100.  Continue antiretroviral once patient starts taking orally. 3. Hyperglycemia with previous history of diabetes off medication at this time.  Check hemoglobin A1c for now we will keep patient on sliding scale coverage. 4. Anemia normocytic normochromic -follow CBC.   DVT prophylaxis: SCDs. Code Status: Full code. Family Communication: Discussed with patient. Disposition Plan: Home. Consults called: General surgery. Admission status: Inpatient.   ARise PatienceMD [facility] Pager [phone]  If 7PM-7AM, please contact night-coverage [email or web address] Password [email or web address]  03/08/2017, 4:51 AM

## 2017-03-08 NOTE — Progress Notes (Signed)
PROGRESS NOTE    Kelly Hunter  UUV:[id number] DOB: [birth year] DOA: 03/08/2017 PCP: [name], MD   Chief Complaint  Patient presents with  . Abscess  . Cellulitis    Brief Narrative:  HPI on 03/08/2017 by Dr. Gean Birchwood Kelly Hunter is a 27 y.o. female with history of HIV and previous history of diabetes off medications after patient's diabetes improved with weight loss presents to the ER with complaints of increasing pain in the left breast area.  Pain is mostly in the inferior aspect of her left breast.  Denies any discharge.  Denies any trauma or insect bite.  Denies any chest pain shortness of breath.  Has been compliant with her antiretroviral.  Interim history  Admitted for sepsis secondary to left breast abscess.  General surgery consulted and appreciated. Assessment & Plan   Sepsis from left breast abscess -Patient presented with low-grade fever, tachycardia, tachypnea, leukocytosis -Noted to have actively draining left breast abscess -General surgery consulted and appreciated, possible surgery on 03/09/2017 -Continue Zosyn and vancomycin  History of HIV -Last CD4 count 2100 in June 2018 -Restarted Triumeq  Diabetes mellitus, type II/hyperglycemia -Currently on no home medications -Hemoglobin A1c pending  -Continue insulin sliding scale and CBG monitoring  Anemia, normocytic -Hemoglobin currently 9.9, continue to monitor CBC  Hypokalemia -K 3.3, replace and continue to monitor BMP  DVT Prophylaxis  SCDs  Code Status: Full  Family Communication: None at bedside  Disposition Plan: Admitted, pending possible surgery  Consultants General surgery  Procedures  none  Antibiotics   Anti-infectives (From admission, onward)   Start     Dose/Rate Route Frequency Ordered Stop   03/08/17 1400  vancomycin (VANCOCIN) IVPB 1000 mg/200 mL premix     1,000 mg 200 mL/hr over 60 Minutes Intravenous Every 8 hours 03/08/17 0457     03/08/17 0800   piperacillin-tazobactam (ZOSYN) IVPB 3.375 g     3.375 g 12.5 mL/hr over 240 Minutes Intravenous Every 8 hours 03/08/17 0457     03/08/17 0215  vancomycin (VANCOCIN) 2,000 mg in sodium chloride 0.9 % 500 mL IVPB     2,000 mg 250 mL/hr over 120 Minutes Intravenous  Once 03/08/17 0204 03/08/17 0816   03/08/17 0200  piperacillin-tazobactam (ZOSYN) IVPB 3.375 g     3.375 g 100 mL/hr over 30 Minutes Intravenous  Once 03/08/17 0155 03/08/17 0307   03/08/17 0200  vancomycin (VANCOCIN) IVPB 1000 mg/200 mL premix  Status:  Discontinued     1,000 mg 200 mL/hr over 60 Minutes Intravenous  Once 03/08/17 0155 03/08/17 4742      Subjective:   Kelly Hunter seen and examined today.  Denies current chest pain, shortness of breath, abdominal pain, nausea vomiting, diarrhea or constipation.  States she has had this abscess for approximately 1 week.  She has had in the past several times.  Objective:   Vitals:   03/08/17 0530 03/08/17 0545 03/08/17 0600 03/08/17 0615  BP: 118/79 115/72 114/71 117/71  Pulse: (!) 108 (!) 102 (!) 110 (!) 114  Resp: (!) 21 16 (!) 27 (!) 33  Temp:      TempSrc:      SpO2: 100% 100% 100% 100%    Intake/Output Summary (Last 24 hours) at 03/08/2017 1156 Last data filed at 03/08/2017 1023 Gross per 24 hour  Intake 4100 ml  Output -  Net 4100 ml   There were no vitals filed for this visit.  Exam  General: Well developed, well nourished,  NAD, appears stated age  58: NCAT, mucous membranes moist.   Cardiovascular: S1 S2 auscultated, no rubs, murmurs or gallops. Regular rate and rhythm.  Breast: left breast with actively draining discharge, purulent   Respiratory: Clear to auscultation bilaterally with equal chest rise  Abdomen: Soft, nontender, nondistended, + bowel sounds  Extremities: warm dry without cyanosis clubbing or edema  Neuro: AAOx3, nonfocal  Psych: Normal affect and demeanor with intact judgement and insight   Data Reviewed: I have  personally reviewed following labs and imaging studies  CBC: Recent Labs  Lab 03/07/17 2300 03/08/17 0459  WBC 19.0* 16.7*  NEUTROABS 14.5* 11.4*  HGB 11.9* 9.9*  HCT 33.6* 28.5*  MCV 83.0 82.6  PLT 407* 604   Basic Metabolic Panel: Recent Labs  Lab 03/07/17 2300 03/08/17 0459  NA 137 137  K 3.7 3.4*  CL 101 104  CO2 23 22  GLUCOSE 154* 109*  BUN 6 6  CREATININE 0.79 0.81  CALCIUM 9.3 7.9*   GFR: CrCl cannot be calculated (Unknown ideal weight.). Liver Function Tests: Recent Labs  Lab 03/07/17 2300 03/08/17 0459  AST 14* 12*  ALT 12* 11*  ALKPHOS 55 44  BILITOT 0.7 0.6  PROT 8.0 6.2*  ALBUMIN 3.6 2.9*   No results for input(s): LIPASE, AMYLASE in the last 168 hours. No results for input(s): AMMONIA in the last 168 hours. Coagulation Profile: Recent Labs  Lab 03/08/17 0459  INR 1.17   Cardiac Enzymes: No results for input(s): CKTOTAL, CKMB, CKMBINDEX, TROPONINI in the last 168 hours. BNP (last 3 results) No results for input(s): PROBNP in the last 8760 hours. HbA1C: No results for input(s): HGBA1C in the last 72 hours. CBG: Recent Labs  Lab 03/08/17 0542 03/08/17 0852  GLUCAP 105* 119*   Lipid Profile: No results for input(s): CHOL, HDL, LDLCALC, TRIG, CHOLHDL, LDLDIRECT in the last 72 hours. Thyroid Function Tests: No results for input(s): TSH, T4TOTAL, FREET4, T3FREE, THYROIDAB in the last 72 hours. Anemia Panel: No results for input(s): VITAMINB12, FOLATE, FERRITIN, TIBC, IRON, RETICCTPCT in the last 72 hours. Urine analysis:    Component Value Date/Time   COLORURINE YELLOW 03/07/2017 2254   APPEARANCEUR HAZY (A) 03/07/2017 2254   LABSPEC 1.025 03/07/2017 2254   PHURINE 5.0 03/07/2017 2254   GLUCOSEU NEGATIVE 03/07/2017 2254   HGBUR MODERATE (A) 03/07/2017 2254   BILIRUBINUR NEGATIVE 03/07/2017 2254   BILIRUBINUR Negative 06/18/2014 1607   KETONESUR 5 (A) 03/07/2017 2254   PROTEINUR 30 (A) 03/07/2017 2254   UROBILINOGEN 0.2  06/18/2014 1607   UROBILINOGEN 1 12/31/2013 1726   NITRITE NEGATIVE 03/07/2017 2254   LEUKOCYTESUR LARGE (A) 03/07/2017 2254   Sepsis Labs: @LABRCNTIP (procalcitonin:4,lacticidven:4)  )No results found for this or any previous visit (from the past 240 hour(s)).    Radiology Studies: Ct Chest W Contrast  Result Date: 03/08/2017 CLINICAL DATA:  Acute onset of left breast swelling and abscess. EXAM: CT CHEST WITH CONTRAST TECHNIQUE: Multidetector CT imaging of the chest was performed during intravenous contrast administration. CONTRAST:  58mL ISOVUE-300 IOPAMIDOL (ISOVUE-300) INJECTION 61% COMPARISON:  Chest radiograph performed earlier today at 2:07 a.m. FINDINGS: Cardiovascular: The heart is unremarkable in appearance. The thoracic aorta is within normal limits. The great vessels are within normal limits. Mediastinum/Nodes: The mediastinum is unremarkable in appearance. No mediastinal lymphadenopathy is seen. No pericardial effusion is identified. The visualized portions of the thyroid gland are unremarkable. There is asymmetric prominence of left axillary nodes, measuring up to 1.8 cm in short axis. Lungs/Pleura:  The lungs are clear bilaterally. No focal consolidation, pleural effusion or pneumothorax is seen. No masses are identified. Upper Abdomen: The visualized portions of the liver and spleen are unremarkable. The visualized portions of the pancreas, adrenal glands and kidneys are within normal limits. Musculoskeletal: No acute osseous abnormalities are identified. The visualized musculature is unremarkable in appearance. There is diffuse skin thickening along much of the left breast, as well as a contiguous underlying heterogeneous mass at the medial aspect of the left breast, measuring approximately 5.3 x 4.5 x 2.9 cm. IMPRESSION: 1. Diffuse skin thickening along much of the left breast, as well as a contiguous underlying heterogeneous mass at the medial aspect of the left breast, measuring 5.3 x  4.5 x 2.9 cm. Though this could reflect diffuse cellulitis and underlying poorly characterized abscess as clinically suspected, would correlate clinically to exclude inflammatory breast cancer. 2. Asymmetric prominence of left axillary nodes, measuring up to 1.8 cm in short axis. Electronically Signed   By: Garald Balding M.D.   On: 03/08/2017 04:10   Dg Chest Port 1 View  Result Date: 03/08/2017 CLINICAL DATA:  Left breast swelling EXAM: PORTABLE CHEST 1 VIEW COMPARISON:  04/29/2015 FINDINGS: The heart size and mediastinal contours are within normal limits. Both lungs are clear. The visualized skeletal structures are unremarkable. Mildly low lung volume IMPRESSION: No active disease. Electronically Signed   By: Donavan Foil M.D.   On: 03/08/2017 02:32     Scheduled Meds: . Chlorhexidine Gluconate Cloth  6 each Topical Once   And  . Chlorhexidine Gluconate Cloth  6 each Topical Once  . insulin aspart  0-9 Units Subcutaneous Q4H   Continuous Infusions: . sodium chloride 125 mL/hr at 03/08/17 0843  . piperacillin-tazobactam (ZOSYN)  IV Stopped (03/08/17 1023)  . vancomycin       LOS: 0 days   Time Spent in minutes   30 minutes  Bartolo Montanye D.O. on 03/08/2017 at 11:56 AM  Between 7am to 7pm - Pager - [phone]  After 7pm go to [email or web address] - password [email or web address]  And look for the night coverage person covering for me after hours  [facility] Office  [phone]

## 2017-03-08 NOTE — H&P (View-Only) (Signed)
OK TO FEED  Will take to OR tomorrow for washout of left breast wound/abscess NPO after midnight

## 2017-03-09 ENCOUNTER — Other Ambulatory Visit: Payer: Self-pay

## 2017-03-09 ENCOUNTER — Inpatient Hospital Stay (HOSPITAL_COMMUNITY): Payer: 59 | Admitting: Anesthesiology

## 2017-03-09 ENCOUNTER — Encounter (HOSPITAL_COMMUNITY)
Admission: EM | Disposition: A | Discharge status: Home Health | Payer: Self-pay | Source: Home / Self Care | Attending: Internal Medicine

## 2017-03-09 ENCOUNTER — Encounter (HOSPITAL_COMMUNITY): Payer: Self-pay | Admitting: *Deleted

## 2017-03-09 DIAGNOSIS — B20 Human immunodeficiency virus [HIV] disease: Secondary | ICD-10-CM

## 2017-03-09 DIAGNOSIS — E119 Type 2 diabetes mellitus without complications: Secondary | ICD-10-CM

## 2017-03-09 DIAGNOSIS — N611 Abscess of the breast and nipple: Secondary | ICD-10-CM

## 2017-03-09 DIAGNOSIS — L03818 Cellulitis of other sites: Secondary | ICD-10-CM

## 2017-03-09 DIAGNOSIS — L0291 Cutaneous abscess, unspecified: Secondary | ICD-10-CM

## 2017-03-09 HISTORY — PX: INCISION AND DRAINAGE ABSCESS: SHX5864

## 2017-03-09 LAB — GLUCOSE, CAPILLARY
GLUCOSE-CAPILLARY: 113 mg/dL — AB (ref 65–99)
GLUCOSE-CAPILLARY: 188 mg/dL — AB (ref 65–99)
Glucose-Capillary: 89 mg/dL (ref 65–99)
Glucose-Capillary: 128 mg/dL — ABNORMAL HIGH (ref 65–99)
Glucose-Capillary: 130 mg/dL — ABNORMAL HIGH (ref 65–99)
Glucose-Capillary: 160 mg/dL — ABNORMAL HIGH (ref 65–99)

## 2017-03-09 LAB — CBC
HCT: 28 % — ABNORMAL LOW (ref 36.0–46.0)
Hemoglobin: 9.7 g/dL — ABNORMAL LOW (ref 12.0–15.0)
MCH: 28.4 pg (ref 26.0–34.0)
MCHC: 34.6 g/dL (ref 30.0–36.0)
MCV: 82.1 fL (ref 78.0–100.0)
Platelets: 379 K/uL (ref 150–400)
RBC: 3.41 MIL/uL — ABNORMAL LOW (ref 3.87–5.11)
RDW: 13.6 % (ref 11.5–15.5)
WBC: 12.2 K/uL — ABNORMAL HIGH (ref 4.0–10.5)

## 2017-03-09 LAB — BASIC METABOLIC PANEL WITH GFR
Anion gap: 9 (ref 5–15)
BUN: <5 mg/dL — ABNORMAL LOW (ref 6–20)
CO2: 25 mmol/L (ref 22–32)
Calcium: 8.5 mg/dL — ABNORMAL LOW (ref 8.9–10.3)
Chloride: 106 mmol/L (ref 101–111)
Creatinine, Ser: 0.7 mg/dL (ref 0.44–1.00)
GFR calc Af Amer: >60 mL/min
GFR calc non Af Amer: >60 mL/min
Glucose, Bld: 101 mg/dL — ABNORMAL HIGH (ref 65–99)
Potassium: 3.8 mmol/L (ref 3.5–5.1)
Sodium: 140 mmol/L (ref 135–145)

## 2017-03-09 LAB — HEMOGLOBIN A1C
HEMOGLOBIN A1C: 5.5 % (ref 4.8–5.6)
Mean Plasma Glucose: 111.15 mg/dL

## 2017-03-09 LAB — SURGICAL PCR SCREEN
MRSA, PCR: NEGATIVE
STAPHYLOCOCCUS AUREUS: POSITIVE — AB

## 2017-03-09 SURGERY — INCISION AND DRAINAGE, ABSCESS
Anesthesia: General | Site: Breast | Laterality: Left

## 2017-03-09 MED ORDER — LACTATED RINGERS IV SOLN
INTRAVENOUS | Status: DC
  Administered 2017-03-09: 09:00:00 via INTRAVENOUS

## 2017-03-09 MED ORDER — MUPIROCIN 2 % EX OINT
1.0000 "application " | TOPICAL_OINTMENT | Freq: Two times a day (BID) | CUTANEOUS | Status: DC
  Administered 2017-03-09 – 2017-03-10 (×3): 1 via NASAL
  Filled 2017-03-09: qty 22

## 2017-03-09 MED ORDER — BUPIVACAINE-EPINEPHRINE 0.25% -1:200000 IJ SOLN
INTRAMUSCULAR | Status: DC | PRN
  Administered 2017-03-09: 5 mL

## 2017-03-09 MED ORDER — DEXAMETHASONE SODIUM PHOSPHATE 10 MG/ML IJ SOLN
INTRAMUSCULAR | Status: DC | PRN
  Administered 2017-03-09: 10 mg via INTRAVENOUS

## 2017-03-09 MED ORDER — SODIUM CHLORIDE 0.9 % IV SOLN
3.0000 g | Freq: Four times a day (QID) | INTRAVENOUS | Status: DC
  Administered 2017-03-09 – 2017-03-10 (×4): 3 g via INTRAVENOUS
  Filled 2017-03-09 (×5): qty 3

## 2017-03-09 MED ORDER — KETOROLAC TROMETHAMINE 30 MG/ML IJ SOLN
INTRAMUSCULAR | Status: AC
  Filled 2017-03-09: qty 1

## 2017-03-09 MED ORDER — OXYCODONE-ACETAMINOPHEN 5-325 MG PO TABS
1.0000 | ORAL_TABLET | ORAL | Status: DC | PRN
  Administered 2017-03-09 – 2017-03-10 (×2): 1 via ORAL
  Filled 2017-03-09 (×2): qty 1

## 2017-03-09 MED ORDER — MEPERIDINE HCL 50 MG/ML IJ SOLN: 6.2500 mg | INTRAMUSCULAR | Status: DC | PRN

## 2017-03-09 MED ORDER — PROPOFOL 10 MG/ML IV BOLUS
INTRAVENOUS | Status: DC | PRN
  Administered 2017-03-09: 150 mg via INTRAVENOUS

## 2017-03-09 MED ORDER — FENTANYL CITRATE (PF) 100 MCG/2ML IJ SOLN
INTRAMUSCULAR | Status: DC | PRN
  Administered 2017-03-09: 150 ug via INTRAVENOUS
  Administered 2017-03-09 (×2): 50 ug via INTRAVENOUS

## 2017-03-09 MED ORDER — FENTANYL CITRATE (PF) 250 MCG/5ML IJ SOLN
INTRAMUSCULAR | Status: AC
  Filled 2017-03-09: qty 5

## 2017-03-09 MED ORDER — FENTANYL CITRATE (PF) 100 MCG/2ML IJ SOLN
INTRAMUSCULAR | Status: AC
  Administered 2017-03-09: 50 ug via INTRAVENOUS
  Filled 2017-03-09: qty 2

## 2017-03-09 MED ORDER — ONDANSETRON HCL 4 MG/2ML IJ SOLN
INTRAMUSCULAR | Status: DC | PRN
  Administered 2017-03-09: 4 mg via INTRAVENOUS

## 2017-03-09 MED ORDER — LACTATED RINGERS IV SOLN
INTRAVENOUS | Status: DC | PRN
  Administered 2017-03-09: 09:00:00 via INTRAVENOUS

## 2017-03-09 MED ORDER — FENTANYL CITRATE (PF) 100 MCG/2ML IJ SOLN
25.0000 ug | INTRAMUSCULAR | Status: DC | PRN
  Administered 2017-03-09: 50 ug via INTRAVENOUS

## 2017-03-09 MED ORDER — LIDOCAINE HCL (CARDIAC) 20 MG/ML IV SOLN
INTRAVENOUS | Status: DC | PRN
  Administered 2017-03-09: 60 mg via INTRAVENOUS

## 2017-03-09 MED ORDER — MIDAZOLAM HCL 2 MG/2ML IJ SOLN
INTRAMUSCULAR | Status: AC
  Filled 2017-03-09: qty 2

## 2017-03-09 MED ORDER — KETOROLAC TROMETHAMINE 30 MG/ML IJ SOLN
30.0000 mg | Freq: Once | INTRAMUSCULAR | Status: DC | PRN
  Administered 2017-03-09: 30 mg via INTRAVENOUS

## 2017-03-09 MED ORDER — MIDAZOLAM HCL 5 MG/5ML IJ SOLN
INTRAMUSCULAR | Status: DC | PRN
  Administered 2017-03-09: 2 mg via INTRAVENOUS

## 2017-03-09 MED ORDER — 0.9 % SODIUM CHLORIDE (POUR BTL) OPTIME
TOPICAL | Status: DC | PRN
  Administered 2017-03-09: 1000 mL

## 2017-03-09 MED ORDER — PROMETHAZINE HCL 25 MG/ML IJ SOLN: 6.2500 mg | INTRAMUSCULAR | Status: DC | PRN

## 2017-03-09 MED ORDER — BUPIVACAINE-EPINEPHRINE 0.25% -1:200000 IJ SOLN
INTRAMUSCULAR | Status: AC
Start: 2017-03-09 — End: ?
  Filled 2017-03-09: qty 1

## 2017-03-09 SURGICAL SUPPLY — 27 items
BNDG GAUZE ELAST 4 BULKY (GAUZE/BANDAGES/DRESSINGS) ×2 IMPLANT
CANISTER SUCT 3000ML PPV (MISCELLANEOUS) ×3 IMPLANT
COVER SURGICAL LIGHT HANDLE (MISCELLANEOUS) ×3 IMPLANT
DRAPE LAPAROSCOPIC ABDOMINAL (DRAPES) ×3 IMPLANT
DRAPE UTILITY XL STRL (DRAPES) ×6 IMPLANT
DRSG PAD ABDOMINAL 8X10 ST (GAUZE/BANDAGES/DRESSINGS) ×9 IMPLANT
ELECT CAUTERY BLADE 6.4 (BLADE) ×3 IMPLANT
ELECT REM PT RETURN 9FT ADLT (ELECTROSURGICAL) ×3
ELECTRODE REM PT RTRN 9FT ADLT (ELECTROSURGICAL) ×1 IMPLANT
GAUZE SPONGE 4X4 12PLY STRL (GAUZE/BANDAGES/DRESSINGS) IMPLANT
GLOVE BIO SURGEON STRL SZ8 (GLOVE) ×3 IMPLANT
GLOVE BIOGEL PI IND STRL 8 (GLOVE) ×1 IMPLANT
GLOVE BIOGEL PI INDICATOR 8 (GLOVE) ×2
GOWN STRL REUS W/ TWL LRG LVL3 (GOWN DISPOSABLE) ×1 IMPLANT
GOWN STRL REUS W/ TWL XL LVL3 (GOWN DISPOSABLE) ×1 IMPLANT
GOWN STRL REUS W/TWL LRG LVL3 (GOWN DISPOSABLE) ×3
GOWN STRL REUS W/TWL XL LVL3 (GOWN DISPOSABLE) ×3
KIT BASIN OR (CUSTOM PROCEDURE TRAY) ×3 IMPLANT
KIT ROOM TURNOVER OR (KITS) ×3 IMPLANT
NS IRRIG 1000ML POUR BTL (IV SOLUTION) ×3 IMPLANT
PACK GENERAL/GYN (CUSTOM PROCEDURE TRAY) ×3 IMPLANT
PAD ARMBOARD 7.5X6 YLW CONV (MISCELLANEOUS) ×6 IMPLANT
SPECIMEN JAR SMALL (MISCELLANEOUS) IMPLANT
SWAB COLLECTION DEVICE MRSA (MISCELLANEOUS) IMPLANT
SWAB CULTURE ESWAB REG 1ML (MISCELLANEOUS) IMPLANT
TOWEL OR 17X24 6PK STRL BLUE (TOWEL DISPOSABLE) ×3 IMPLANT
TOWEL OR 17X26 10 PK STRL BLUE (TOWEL DISPOSABLE) ×3 IMPLANT

## 2017-03-09 NOTE — Interval H&P Note (Signed)
History and Physical Interval Note:  03/09/2017 9:25 AM  Kelly Hunter  has presented today for surgery, with the diagnosis of left breast abscess  The various methods of treatment have been discussed with the patient and family. After consideration of risks, benefits and other options for treatment, the patient has consented to  Procedure(s): INCISION AND DRAINAGE ABSCESS LEFT BREAST ABSCESS (Left) as a surgical intervention .  The patient's history has been reviewed, patient examined, no change in status, stable for surgery.  I have reviewed the patient's chart and labs.  Questions were answered to the patient's satisfaction.     [place]

## 2017-03-09 NOTE — Op Note (Signed)
Preoperative diagnosis: Left breast abscess  Postoperative diagnosis: Same  Procedure: Incision and drainage of left breast abscess  Surgeon: Erroll Luna MD  Anesthesia: General with local  EBL: 10 cc  Specimen: None  Drains: None  IV fluids: Per anesthesia  Indications for procedure: The patient developed a left breast abscess.  She is a history of bilateral breast reduction.  The abscesses in the medial left breast just superior to the scar.  This had drained spontaneously but she presents for wider irrigation debridement of this abscess.  Risk, benefits and other options were discussed with the patient.The procedure has been discussed with the patient.  Alternative therapies have been discussed with the patient.  Operative risks include bleeding,  Infection,  Organ injury,  Nerve injury,  Blood vessel injury,  DVT,  Pulmonary embolism,  Death,  And possible reoperation.  Medical management risks include worsening of present situation.  The success of the procedure is 50 -90 % at treating patients symptoms.  The patient understands and agrees to proceed.  Description of procedure: The patient was met in the holding area.  The left breast was marked as the correct side.  We discussed the procedure again as well as pros and cons of surgery and postoperative expectations.  She was taken back to the operating room placed supine.  After induction of anesthesia the left breast was prepped and draped in sterile fashion.  Timeout was done.  She was already on antibiotics for treatment of her cellulitis and abscess.  The abscess was located in the medial left breast.  I opened the cavity along the previous breast reduction scar.  A 4 cm cavity was encountered and this was empty of all material.  This was irrigated out.  The cavity was cauterized since there was some chronic granulation tissue within it.  Hemostasis was achieved.  Cultures were not taken since there is no material to culture and she  has been on antibiotics for 2 days.  He was packed with saline soaked gauze.  Dry dressing applied.  All final counts found to be correct.  The patient was awoke extubated taken to recovery in satisfactory condition.

## 2017-03-09 NOTE — Anesthesia Procedure Notes (Signed)
Procedure Name: LMA Insertion Date/Time: 03/09/2017 9:52 AM Performed by: Neldon Newport, CRNA Pre-anesthesia Checklist: Timeout performed, Patient being monitored, Suction available, Emergency Drugs available and Patient identified Patient Re-evaluated:Patient Re-evaluated prior to induction Oxygen Delivery Method: Circle system utilized Preoxygenation: Pre-oxygenation with 100% oxygen Induction Type: IV induction Ventilation: Mask ventilation without difficulty LMA: LMA inserted LMA Size: 4.0 Number of attempts: 1 Placement Confirmation: breath sounds checked- equal and bilateral and positive ETCO2 Tube secured with: Tape Dental Injury: Teeth and Oropharynx as per pre-operative assessment

## 2017-03-09 NOTE — Anesthesia Postprocedure Evaluation (Signed)
Anesthesia Post Note  Patient: Kelly Hunter  Procedure(s) Performed: INCISION AND DRAINAGE ABSCESS LEFT BREAST ABSCESS (Left Breast)     Patient location during evaluation: PACU Anesthesia Type: General Level of consciousness: awake Pain management: pain level controlled Vital Signs Assessment: post-procedure vital signs reviewed and stable Respiratory status: spontaneous breathing Cardiovascular status: stable Postop Assessment: no apparent nausea or vomiting Anesthetic complications: no    Last Vitals:  Vitals:   03/09/17 1115 03/09/17 1131  BP: (!) 141/91 (!) 143/93  Pulse: 88 90  Resp: 13 18  Temp: 36.5 C 36.6 C  SpO2: 99% 100%    Last Pain:  Vitals:   03/09/17 1131  TempSrc: Oral  PainSc:    Pain Goal: Patients Stated Pain Goal: 2 (03/08/17 2200)               [place]

## 2017-03-09 NOTE — Discharge Instructions (Signed)
WOUND CARE: - dressing to be changed twice daily - supplies: sterile saline, gauze, scissors, ABD pads, tape  - remove dressing and all packing carefully, moistening with sterile saline as needed to avoid packing/internal dressing sticking to the wound. - clean edges of skin around the wound with water/gauze, making sure there is no tape debris or leakage left on skin that could cause skin irritation or breakdown. - dampen and clean gauze with sterile saline and pack wound from wound base to skin level, making sure to take note of any possible areas of wound tracking, tunneling and packing appropriately. Wound can be packed loosely. Trim gauze to size if a whole gauze is not required. - cover wound with a dry ABD pad and secure with tape.  - write the date/time on the dry dressing/tape to better track when the last dressing change occurred. - change dressing as needed if leakage occurs, wound gets contaminated, or patient requests to shower. - patient may shower daily with wound open and following the shower the wound should be dried and a clean dressing placed.

## 2017-03-09 NOTE — Transfer of Care (Signed)
Immediate Anesthesia Transfer of Care Note  Patient: Kelly Hunter  Procedure(s) Performed: INCISION AND DRAINAGE ABSCESS LEFT BREAST ABSCESS (Left Breast)  Patient Location: PACU  Anesthesia Type:General  Level of Consciousness: awake, alert  and oriented  Airway & Oxygen Therapy: Patient Spontanous Breathing and Patient connected to nasal cannula oxygen  Post-op Assessment: Report given to RN, Post -op Vital signs reviewed and stable and Patient moving all extremities X 4  Post vital signs: Reviewed and stable  Last Vitals:  Vitals:   03/09/17 0412 03/09/17 1032  BP: 109/72 122/84  Pulse: 83 94  Resp: 17 16  Temp: 36.9 C (!) 36.4 C  SpO2: 100% 100%    Last Pain:  Vitals:   03/09/17 1032  TempSrc:   PainSc: 0-No pain      Patients Stated Pain Goal: 2 (65/46/50 3546)  Complications: No apparent anesthesia complications

## 2017-03-09 NOTE — Anesthesia Preprocedure Evaluation (Signed)
Anesthesia Evaluation    Airway Mallampati: II       Dental no notable dental hx. (+) Teeth Intact   Pulmonary    Pulmonary exam normal breath sounds clear to auscultation       Cardiovascular negative cardio ROS Normal cardiovascular exam Rhythm:Regular Rate:Normal     Neuro/Psych negative neurological ROS  negative psych ROS   GI/Hepatic negative GI ROS, Neg liver ROS,   Endo/Other  diabetes, Type 2Morbid obesity  Renal/GU negative Renal ROS     Musculoskeletal   Abdominal (+) + obese,   Peds  Hematology  (+) Blood dyscrasia, anemia , HIV,   Anesthesia Other Findings   Reproductive/Obstetrics                             Anesthesia Physical Anesthesia Plan  ASA: III  Anesthesia Plan: General   Post-op Pain Management:    Induction:   PONV Risk Score and Plan: 4 or greater and Dexamethasone, Ondansetron and Scopolamine patch - Pre-op  Airway Management Planned: LMA  Additional Equipment:   Intra-op Plan:   Post-operative Plan:   Informed Consent: I have reviewed the patients History and Physical, chart, labs and discussed the procedure including the risks, benefits and alternatives for the proposed anesthesia with the patient or authorized representative who has indicated his/her understanding and acceptance.   Dental advisory given  Plan Discussed with: CRNA and Surgeon  Anesthesia Plan Comments:         Anesthesia Quick Evaluation

## 2017-03-09 NOTE — Progress Notes (Signed)
PROGRESS NOTE    Kelly Hunter  PJA:[id number] DOB: 1990-12-12 DOA: 03/08/2017 PCP: [name], MD   Chief Complaint  Patient presents with  . Abscess  . Cellulitis    Brief Narrative:  HPI on 03/08/2017 by Dr. Gean Birchwood Kelly Hunter is a 27 y.o. female with history of HIV and previous history of diabetes off medications after patient's diabetes improved with weight loss presents to the ER with complaints of increasing pain in the left breast area.  Pain is mostly in the inferior aspect of her left breast.  Denies any discharge.  Denies any trauma or insect bite.  Denies any chest pain shortness of breath.  Has been compliant with her antiretroviral.  Interim history  Admitted for sepsis secondary to left breast abscess.  General surgery consulted and appreciated s/p I&D. Assessment & Plan   Sepsis from left breast abscess -Patient presented with low-grade fever, tachycardia, tachypnea, leukocytosis -Noted to have actively draining left breast abscess -General surgery consulted and appreciated, s/p I&D  -was initially placed on Zosyn and vancomycin, will transition to unasyn  History of HIV -Last CD4 count 2100 in June 2018 -Continue Triumeq  Diabetes mellitus, type II/hyperglycemia -Currently on no home medications -Hemoglobin A1c 5.5 -Continue insulin sliding scale and CBG monitoring  Anemia, normocytic -Hemoglobin currently 9.9, continue to monitor CBC  Hypokalemia -resolved with replacement, continue to monitor BMP  DVT Prophylaxis  SCDs  Code Status: Full  Family Communication: Mother at bedside  Disposition Plan: Admitted, possible discharge to home in 1-2 days  Consultants General surgery  Procedures  I&D  Antibiotics   Anti-infectives (From admission, onward)   Start     Dose/Rate Route Frequency Ordered Stop   03/09/17 1600  Ampicillin-Sulbactam (UNASYN) 3 g in sodium chloride 0.9 % 100 mL IVPB     3 g 200 mL/hr over 30 Minutes  Intravenous Every 6 hours 03/09/17 1313     03/09/17 0800  ceFAZolin (ANCEF) 3 g in dextrose 5 % 50 mL IVPB     3 g 130 mL/hr over 30 Minutes Intravenous To ShortStay Surgical 03/08/17 1418 03/09/17 0953   03/08/17 1400  vancomycin (VANCOCIN) IVPB 1000 mg/200 mL premix  Status:  Discontinued     1,000 mg 200 mL/hr over 60 Minutes Intravenous Every 8 hours 03/08/17 0457 03/09/17 1312   03/08/17 1300  abacavir-dolutegravir-lamiVUDine (TRIUMEQ) 600-50-300 MG per tablet 1 tablet    Comments:  TAKE ONE TABLET BY MOUTH ONCE DAILY. STORE IN ORIGINAL BOTTLE AT [facility].     1 tablet Oral Daily 03/08/17 1205     03/08/17 0800  piperacillin-tazobactam (ZOSYN) IVPB 3.375 g  Status:  Discontinued     3.375 g 12.5 mL/hr over 240 Minutes Intravenous Every 8 hours 03/08/17 0457 03/09/17 1312   03/08/17 0215  vancomycin (VANCOCIN) 2,000 mg in sodium chloride 0.9 % 500 mL IVPB     2,000 mg 250 mL/hr over 120 Minutes Intravenous  Once 03/08/17 0204 03/08/17 0816   03/08/17 0200  piperacillin-tazobactam (ZOSYN) IVPB 3.375 g     3.375 g 100 mL/hr over 30 Minutes Intravenous  Once 03/08/17 0155 03/08/17 0307   03/08/17 0200  vancomycin (VANCOCIN) IVPB 1000 mg/200 mL premix  Status:  Discontinued     1,000 mg 200 mL/hr over 60 Minutes Intravenous  Once 03/08/17 0155 03/08/17 3419      Subjective:   Kelly Hunter seen and examined today.  Denies current pain. Denies chest pain, shortness of breath, abdominal  pain, N/V/D/C, dizziness, headache.  Objective:   Vitals:   03/09/17 1047 03/09/17 1102 03/09/17 1115 03/09/17 1131  BP: 132/86 (!) 137/92 (!) 141/91 (!) 143/93  Pulse: 93 69 88 90  Resp: 13 17 13 18   Temp:   97.7 F (36.5 C) 97.8 F (36.6 C)  TempSrc:    Oral  SpO2: 99% 98% 99% 100%  Weight:      Height:        Intake/Output Summary (Last 24 hours) at 03/09/2017 1343 Last data filed at 03/09/2017 1116 Gross per 24 hour  Intake 4268.34 ml  Output 10 ml  Net 4258.34 ml   Filed  Weights   03/09/17 0830  Weight: 102.5 kg (226 lb)   Exam  General: Well developed, well nourished, NAD, appears stated age  41: NCAT,mucous membranes moist.   Cardiovascular: S1 S2 auscultated, no rubs, murmurs or gallops. Regular rate and rhythm.  Respiratory: Clear to auscultation bilaterally with equal chest rise  Breast: dressing in place on left breast  Abdomen: Soft, nontender, nondistended, + bowel sounds  Extremities: warm dry without cyanosis clubbing or edema  Neuro: AAOx3, nonfocal  Psych: Appropriate mood and affect, pleasant  Data Reviewed: I have personally reviewed following labs and imaging studies  CBC: Recent Labs  Lab 03/07/17 2300 03/08/17 0459 03/09/17 0352  WBC 19.0* 16.7* 12.2*  NEUTROABS 14.5* 11.4*  --   HGB 11.9* 9.9* 9.7*  HCT 33.6* 28.5* 28.0*  MCV 83.0 82.6 82.1  PLT 407* 355 924   Basic Metabolic Panel: Recent Labs  Lab 03/07/17 2300 03/08/17 0459 03/09/17 0352  NA 137 137 140  K 3.7 3.4* 3.8  CL 101 104 106  CO2 23 22 25   GLUCOSE 154* 109* 101*  BUN 6 6 <5*  CREATININE 0.79 0.81 0.70  CALCIUM 9.3 7.9* 8.5*   GFR: Estimated Creatinine Clearance: 120.7 mL/min (by C-G formula based on SCr of 0.7 mg/dL). Liver Function Tests: Recent Labs  Lab 03/07/17 2300 03/08/17 0459  AST 14* 12*  ALT 12* 11*  ALKPHOS 55 44  BILITOT 0.7 0.6  PROT 8.0 6.2*  ALBUMIN 3.6 2.9*   No results for input(s): LIPASE, AMYLASE in the last 168 hours. No results for input(s): AMMONIA in the last 168 hours. Coagulation Profile: Recent Labs  Lab 03/08/17 0459  INR 1.17   Cardiac Enzymes: No results for input(s): CKTOTAL, CKMB, CKMBINDEX, TROPONINI in the last 168 hours. BNP (last 3 results) No results for input(s): PROBNP in the last 8760 hours. HbA1C: Recent Labs    03/08/17 0352  HGBA1C 5.5   CBG: Recent Labs  Lab 03/08/17 2339 03/09/17 0411 03/09/17 0752 03/09/17 1032 03/09/17 1215  GLUCAP 122* 89 113* 128* 130*    Lipid Profile: No results for input(s): CHOL, HDL, LDLCALC, TRIG, CHOLHDL, LDLDIRECT in the last 72 hours. Thyroid Function Tests: No results for input(s): TSH, T4TOTAL, FREET4, T3FREE, THYROIDAB in the last 72 hours. Anemia Panel: No results for input(s): VITAMINB12, FOLATE, FERRITIN, TIBC, IRON, RETICCTPCT in the last 72 hours. Urine analysis:    Component Value Date/Time   COLORURINE YELLOW 03/07/2017 2254   APPEARANCEUR HAZY (A) 03/07/2017 2254   LABSPEC 1.025 03/07/2017 2254   PHURINE 5.0 03/07/2017 2254   GLUCOSEU NEGATIVE 03/07/2017 2254   HGBUR MODERATE (A) 03/07/2017 2254   BILIRUBINUR NEGATIVE 03/07/2017 2254   BILIRUBINUR Negative 06/18/2014 1607   KETONESUR 5 (A) 03/07/2017 2254   PROTEINUR 30 (A) 03/07/2017 2254   UROBILINOGEN 0.2 06/18/2014 1607  UROBILINOGEN 1 12/31/2013 1726   NITRITE NEGATIVE 03/07/2017 2254   LEUKOCYTESUR LARGE (A) 03/07/2017 2254   Sepsis Labs: @LABRCNTIP (procalcitonin:4,lacticidven:4)  ) Recent Results (from the past 240 hour(s))  Surgical PCR screen     Status: Abnormal   Collection Time: 03/09/17  7:54 AM  Result Value Ref Range Status   MRSA, PCR NEGATIVE NEGATIVE Final   Staphylococcus aureus POSITIVE (A) NEGATIVE Final    Comment: (NOTE) The Xpert SA Assay (FDA approved for NASAL specimens in patients 28 years of age and older), is one component of a comprehensive surveillance program. It is not intended to diagnose infection nor to guide or monitor treatment. Performed at [facility], [place] [address], [place], [place] [address]       Radiology Studies: Ct Chest W Contrast  Result Date: 03/08/2017 CLINICAL DATA:  Acute onset of left breast swelling and abscess. EXAM: CT CHEST WITH CONTRAST TECHNIQUE: Multidetector CT imaging of the chest was performed during intravenous contrast administration. CONTRAST:  57mL ISOVUE-300 IOPAMIDOL (ISOVUE-300) INJECTION 61% COMPARISON:  Chest radiograph performed earlier today at 2:07  a.m. FINDINGS: Cardiovascular: The heart is unremarkable in appearance. The thoracic aorta is within normal limits. The great vessels are within normal limits. Mediastinum/Nodes: The mediastinum is unremarkable in appearance. No mediastinal lymphadenopathy is seen. No pericardial effusion is identified. The visualized portions of the thyroid gland are unremarkable. There is asymmetric prominence of left axillary nodes, measuring up to 1.8 cm in short axis. Lungs/Pleura: The lungs are clear bilaterally. No focal consolidation, pleural effusion or pneumothorax is seen. No masses are identified. Upper Abdomen: The visualized portions of the liver and spleen are unremarkable. The visualized portions of the pancreas, adrenal glands and kidneys are within normal limits. Musculoskeletal: No acute osseous abnormalities are identified. The visualized musculature is unremarkable in appearance. There is diffuse skin thickening along much of the left breast, as well as a contiguous underlying heterogeneous mass at the medial aspect of the left breast, measuring approximately 5.3 x 4.5 x 2.9 cm. IMPRESSION: 1. Diffuse skin thickening along much of the left breast, as well as a contiguous underlying heterogeneous mass at the medial aspect of the left breast, measuring 5.3 x 4.5 x 2.9 cm. Though this could reflect diffuse cellulitis and underlying poorly characterized abscess as clinically suspected, would correlate clinically to exclude inflammatory breast cancer. 2. Asymmetric prominence of left axillary nodes, measuring up to 1.8 cm in short axis. Electronically Signed   By: Garald Balding M.D.   On: 03/08/2017 04:10   Dg Chest Port 1 View  Result Date: 03/08/2017 CLINICAL DATA:  Left breast swelling EXAM: PORTABLE CHEST 1 VIEW COMPARISON:  04/29/2015 FINDINGS: The heart size and mediastinal contours are within normal limits. Both lungs are clear. The visualized skeletal structures are unremarkable. Mildly low lung volume  IMPRESSION: No active disease. Electronically Signed   By: Donavan Foil M.D.   On: 03/08/2017 02:32     Scheduled Meds: . abacavir-dolutegravir-lamiVUDine  1 tablet Oral Daily  . Chlorhexidine Gluconate Cloth  6 each Topical Once  . Influenza vac split quadrivalent PF  0.5 mL Intramuscular Tomorrow-1000  . insulin aspart  0-9 Units Subcutaneous Q4H  . ketorolac      . mupirocin ointment  1 application Nasal BID  . pneumococcal 23 valent vaccine  0.5 mL Intramuscular Tomorrow-1000   Continuous Infusions: . ampicillin-sulbactam (UNASYN) IV    . lactated ringers 10 mL/hr at 03/09/17 0920     LOS: 1 day  Time Spent in minutes   30 minutes  Wynston Romey D.O. on 03/09/2017 at 1:43 PM  Between 7am to 7pm - Pager - [phone]  After 7pm go to [email or web address] - password [email or web address]  And look for the night coverage person covering for me after hours  [facility] Office  [phone]

## 2017-03-10 ENCOUNTER — Encounter: Payer: Self-pay | Admitting: Family Medicine

## 2017-03-10 ENCOUNTER — Encounter (HOSPITAL_COMMUNITY): Payer: Self-pay | Admitting: Surgery

## 2017-03-10 ENCOUNTER — Telehealth: Payer: Self-pay

## 2017-03-10 LAB — CBC
HEMATOCRIT: 30.1 % — AB (ref 36.0–46.0)
Hemoglobin: 10.7 g/dL — ABNORMAL LOW (ref 12.0–15.0)
MCH: 29.1 pg (ref 26.0–34.0)
MCHC: 35.5 g/dL (ref 30.0–36.0)
MCV: 81.8 fL (ref 78.0–100.0)
Platelets: 437 10*3/uL — ABNORMAL HIGH (ref 150–400)
RBC: 3.68 MIL/uL — AB (ref 3.87–5.11)
RDW: 13.5 % (ref 11.5–15.5)
WBC: 14.6 10*3/uL — AB (ref 4.0–10.5)

## 2017-03-10 LAB — GLUCOSE, CAPILLARY
GLUCOSE-CAPILLARY: 93 mg/dL (ref 65–99)
Glucose-Capillary: 140 mg/dL — ABNORMAL HIGH (ref 65–99)
Glucose-Capillary: 96 mg/dL (ref 65–99)

## 2017-03-10 MED ORDER — AMOXICILLIN-POT CLAVULANATE 875-125 MG PO TABS: 1.0000 | ORAL_TABLET | Freq: Two times a day (BID) | ORAL | 0 refills | Status: DC

## 2017-03-10 MED ORDER — AMOXICILLIN-POT CLAVULANATE 875-125 MG PO TABS: 1.0000 | ORAL_TABLET | Freq: Two times a day (BID) | ORAL | Status: DC

## 2017-03-10 MED ORDER — OXYCODONE-ACETAMINOPHEN 5-325 MG PO TABS: 1.0000 | ORAL_TABLET | ORAL | 0 refills | Status: DC | PRN

## 2017-03-10 NOTE — Care Management Note (Signed)
Case Management Note  Patient Details  Name: Kelly Hunter MRN: [id number] Date of Birth: 07-18-1990  Subjective/Objective:            Sepsis/L breast abscess      s/p I & D left breast abscess, 03/09/2017        PCP: Reginia Forts  Action/Plan: Transition to home today with home health services to follow.  Expected Discharge Date:  03/10/17               Expected Discharge Plan:  [place]  In-House Referral:     Discharge planning Services  CM Consult  Post Acute Care Choice:  Home Health Choice offered to:  Patient  DME Arranged:    DME Agency:     HH Arranged:  RN [place] Agency:  [place]  Status of Service:  Completed, signed off  If discussed at [place] of Stay Meetings, dates discussed:    Additional Comments:  Sharin Mons, RN 03/10/2017, 11:19 AM

## 2017-03-10 NOTE — Progress Notes (Signed)
1 Day Post-Op   Subjective/Chief Complaint: PT DOING OK   Objective: Vital signs in last 24 hours: Temp:  [97.5 F (36.4 C)-98.2 F (36.8 C)] 98.2 F (36.8 C) (02/08 0607) Pulse Rate:  [69-94] 78 (02/08 0607) Resp:  [13-18] 18 (02/08 0607) BP: (122-143)/(74-93) 126/78 (02/08 0607) SpO2:  [97 %-100 %] 97 % (02/08 0607) Last BM Date: 03/09/17  Intake/Output from previous day: 02/07 0701 - 02/08 0700 In: 2091.6 [P.O.:582; I.V.:1284.6; IV Piggyback:150] Out: 10 [Blood:10] Intake/Output this shift: No intake/output data recorded.  Incision/Wound:DRESSING LEFT BREAST IN PLACE WOUND CLEAN OPENED AND PACKED   Lab Results:  Recent Labs    03/09/17 0352 03/10/17 0732  WBC 12.2* 14.6*  HGB 9.7* 10.7*  HCT 28.0* 30.1*  PLT 379 437*   BMET Recent Labs    03/08/17 0459 03/09/17 0352  NA 137 140  K 3.4* 3.8  CL 104 106  CO2 22 25  GLUCOSE 109* 101*  BUN 6 <5*  CREATININE 0.81 0.70  CALCIUM 7.9* 8.5*   PT/INR Recent Labs    03/08/17 0459  LABPROT 14.8  INR 1.17   ABG No results for input(s): PHART, HCO3 in the last 72 hours.  Invalid input(s): PCO2, PO2  Studies/Results: No results found.  Anti-infectives: Anti-infectives (From admission, onward)   Start     Dose/Rate Route Frequency Ordered Stop   03/09/17 1600  Ampicillin-Sulbactam (UNASYN) 3 g in sodium chloride 0.9 % 100 mL IVPB     3 g 200 mL/hr over 30 Minutes Intravenous Every 6 hours 03/09/17 1313     03/09/17 0800  ceFAZolin (ANCEF) 3 g in dextrose 5 % 50 mL IVPB     3 g 130 mL/hr over 30 Minutes Intravenous To ShortStay Surgical 03/08/17 1418 03/09/17 0953   03/08/17 1400  vancomycin (VANCOCIN) IVPB 1000 mg/200 mL premix  Status:  Discontinued     1,000 mg 200 mL/hr over 60 Minutes Intravenous Every 8 hours 03/08/17 0457 03/09/17 1312   03/08/17 1300  abacavir-dolutegravir-lamiVUDine (TRIUMEQ) 600-50-300 MG per tablet 1 tablet    Comments:  TAKE ONE TABLET BY MOUTH ONCE DAILY. STORE IN ORIGINAL  BOTTLE AT [facility].     1 tablet Oral Daily 03/08/17 1205     03/08/17 0800  piperacillin-tazobactam (ZOSYN) IVPB 3.375 g  Status:  Discontinued     3.375 g 12.5 mL/hr over 240 Minutes Intravenous Every 8 hours 03/08/17 0457 03/09/17 1312   03/08/17 0215  vancomycin (VANCOCIN) 2,000 mg in sodium chloride 0.9 % 500 mL IVPB     2,000 mg 250 mL/hr over 120 Minutes Intravenous  Once 03/08/17 0204 03/08/17 0816   03/08/17 0200  piperacillin-tazobactam (ZOSYN) IVPB 3.375 g     3.375 g 100 mL/hr over 30 Minutes Intravenous  Once 03/08/17 0155 03/08/17 0307   03/08/17 0200  vancomycin (VANCOCIN) IVPB 1000 mg/200 mL premix  Status:  Discontinued     1,000 mg 200 mL/hr over 60 Minutes Intravenous  Once 03/08/17 0155 03/08/17 0204      Assessment/Plan: s/p Procedure(s): INCISION AND DRAINAGE ABSCESS LEFT BREAST ABSCESS (Left) CAN GO home with HHNc -order placed Needs 7 days of ABX  Follow up with [facility] clinic 1 week   LOS: 2 days    Kelly Hunter 03/10/2017

## 2017-03-10 NOTE — Progress Notes (Signed)
Pt discharged to home. PIV removed, AVS reviewed. Pt left unit via wheelchair, belongings in hand. Pt to follow up with [facility], and PCP. Pt to be transported home b mother.

## 2017-03-10 NOTE — Telephone Encounter (Signed)
Transition Care Management Follow-up Telephone Call   Date discharged? 03/10/17   How have you been since you were released from the hospital? Patient is doing fine since being discharged home.    Do you understand why you were in the hospital? yes   Do you understand the discharge instructions? yes   Where were you discharged to? home   Items Reviewed:  Medications reviewed: yes  Allergies reviewed: yes  Dietary changes reviewed: yes  Referrals reviewed: yes   Functional Questionnaire:   Activities of Daily Living (ADLs):   She states they are independent in the following: ambulation, bathing and hygiene, feeding, continence, grooming, toileting and dressing States they require assistance with the following: none   Any transportation issues/concerns?: no   Any patient concerns? no   Confirmed importance and date/time of follow-up visits scheduled yes  Provider Appointment booked with Dr. Tamala Julian on 03/22/17 @ 12 pm.   Confirmed with patient if condition begins to worsen call PCP or go to the ER.  Patient was given the office number and encouraged to call back with question or concerns.  : yes

## 2017-03-10 NOTE — Discharge Summary (Signed)
Physician Discharge Summary  Kelly Hunter FIE:[id number] DOB: Aug 06, 1990 DOA: 03/08/2017  PCP: Kelly Honour, MD  Admit date: 03/08/2017 Discharge date: 03/10/2017  Time spent: 45 minutes  Recommendations for Outpatient Follow-up:  Patient will be discharged to home with home health nursing.  Patient will need to follow up with primary care provider within one week of discharge.  Follow up with surgery in one week. Patient should continue medications as prescribed.  Patient should follow a carb modified diet.   Discharge Diagnoses:  Sepsis from left breast abscess History of HIV Diabetes mellitus, type II/hyperglycemia Anemia, normocytic Hypokalemia  Discharge Condition: stable  Diet recommendation: carb modified  Filed Weights   03/09/17 0830  Weight: 102.5 kg (226 lb)    History of present illness:  on 03/08/2017 by Dr. Barnett Abu T Jenkinsis a 27 y.o.femalewithhistory of HIV and previous history of diabetes off medications after patient's diabetes improved with weight loss presents to the ER with complaints of increasing pain in the left breast area. Pain is mostly in the inferior aspect of her left breast. Denies any discharge. Denies any trauma or insect bite. Denies any chest pain shortness of breath.Has been compliant with her antiretroviral.  Hospital Course:  Sepsis from left breast abscess -Patient presented with low-grade fever, tachycardia, tachypnea, leukocytosis -Noted to have actively draining left breast abscess -General surgery consulted and appreciated, s/p I&D  -was initially placed on Zosyn and vancomycin, transitioned to unasyn -will discharge with augmentin -patient will need to follow up with [facility] in one week  History of HIV -Last CD4 count 2100 in June 2018 -Continue Triumeq  Diabetes mellitus, type II/hyperglycemia -Currently on no home medications- was told to watch her diet -review of chart showed A1c 6.9 in  2016 -Hemoglobin A1c 5.5  Anemia, normocytic -Hemoglobin currently 10.7  Hypokalemia -resolved with replacement  Procedures: I&D  Consultations: General surgery  Discharge Exam: Vitals:   03/09/17 2252 03/10/17 0607  BP: 135/74 126/78  Pulse: 86 78  Resp: 18 18  Temp: 98.2 F (36.8 C) 98.2 F (36.8 C)  SpO2: 100% 97%     General: Well developed, well nourished, NAD, appears stated age  HEENT: NCAT, mucous membranes moist.  Cardiovascular: S1 S2 auscultated, no rubs, murmurs or gallops. Regular rate and rhythm.  Respiratory: Clear to auscultation bilaterally with equal chest rise  Abdomen: Soft, nontender, nondistended, + bowel sounds  Extremities: warm dry without cyanosis clubbing or edema  Neuro: AAOx3, nonfocal  Psych: Normal affect and demeanor with intact judgement and insight  Discharge Instructions  Allergies as of 03/10/2017      Reactions   Other Nausea And Vomiting, Swelling   SWELLING REACTION UNSPECIFIED  Tree Nuts (Almonds)   Peanut Oil Nausea And Vomiting, Swelling   SWELLING REACTION UNSPECIFIED    Peanut-containing Drug Products Nausea And Vomiting, Swelling   SWELLING REACTION UNSPECIFIED       Medication List    TAKE these medications   abacavir-dolutegravir-lamiVUDine 600-50-300 MG tablet Commonly known as:  TRIUMEQ TAKE ONE TABLET BY MOUTH ONCE DAILY. STORE IN ORIGINAL BOTTLE AT ROOMTEMPERATURE.   albuterol 108 (90 Base) MCG/ACT inhaler Commonly known as:  PROVENTIL HFA;VENTOLIN HFA Inhale 2 puffs into the lungs every 6 (six) hours as needed. What changed:  reasons to take this   AMBULATORY NON FORMULARY MEDICATION Boric Acid Suppository 600 mg Insert 1 PV twice weekly to maintain vaginal flora What changed:    how much to take  how to take this  when to take this  reasons to take this  additional instructions   amoxicillin-clavulanate 875-125 MG tablet Commonly known as:  AUGMENTIN Take 1 tablet by mouth every  12 (twelve) hours.   imiquimod 5 % cream Commonly known as:  ALDARA Apply topically 3 (three) times a week. What changed:    how much to take  when to take this  reasons to take this   oxyCODONE-acetaminophen 5-325 MG tablet Commonly known as:  PERCOCET/ROXICET Take 1 tablet by mouth every 4 (four) hours as needed for moderate pain.   triamcinolone 0.1 % cream : eucerin Crea Use as directed What changed:    how much to take  how to take this  when to take this  additional instructions   valACYclovir 1000 MG tablet Commonly known as:  VALTREX TAKE 1 TABLET (1,000 MG TOTAL) BY MOUTH DAILY.      Allergies  Allergen Reactions  . Other Nausea And Vomiting and Swelling    SWELLING REACTION UNSPECIFIED  Tree Nuts (Almonds)  . Peanut Oil Nausea And Vomiting and Swelling    SWELLING REACTION UNSPECIFIED   . Peanut-Containing Drug Products Nausea And Vomiting and Swelling    SWELLING REACTION UNSPECIFIED    Follow-up Information    [facility], [place]. Go on 03/23/2017.   Specialty:  General Surgery Why:  Your appointment is 02/21 at 3:30PM. Please arrive 30 minutes prior to your appointment to check in and fill out paperwork. Bring photo ID and insurance information.  Contact information: [address] [place]       Kelly Honour, MD. Schedule an appointment as soon as possible for a visit in 1 week(s).   Specialty:  Family Medicine Why:  Hospital follow up Contact information: [place] [address] [phone]            The results of significant diagnostics from this hospitalization (including imaging, microbiology, ancillary and laboratory) are listed below for reference.    Significant Diagnostic Studies: Ct Chest W Contrast  Result Date: 03/08/2017 CLINICAL DATA:  Acute onset of left breast swelling and abscess. EXAM: CT CHEST WITH CONTRAST TECHNIQUE:  Multidetector CT imaging of the chest was performed during intravenous contrast administration. CONTRAST:  48mL ISOVUE-300 IOPAMIDOL (ISOVUE-300) INJECTION 61% COMPARISON:  Chest radiograph performed earlier today at 2:07 a.m. FINDINGS: Cardiovascular: The heart is unremarkable in appearance. The thoracic aorta is within normal limits. The great vessels are within normal limits. Mediastinum/Nodes: The mediastinum is unremarkable in appearance. No mediastinal lymphadenopathy is seen. No pericardial effusion is identified. The visualized portions of the thyroid gland are unremarkable. There is asymmetric prominence of left axillary nodes, measuring up to 1.8 cm in short axis. Lungs/Pleura: The lungs are clear bilaterally. No focal consolidation, pleural effusion or pneumothorax is seen. No masses are identified. Upper Abdomen: The visualized portions of the liver and spleen are unremarkable. The visualized portions of the pancreas, adrenal glands and kidneys are within normal limits. Musculoskeletal: No acute osseous abnormalities are identified. The visualized musculature is unremarkable in appearance. There is diffuse skin thickening along much of the left breast, as well as a contiguous underlying heterogeneous mass at the medial aspect of the left breast, measuring approximately 5.3 x 4.5 x 2.9 cm. IMPRESSION: 1. Diffuse skin thickening along much of the left breast, as well as a contiguous underlying heterogeneous mass at the medial aspect of the left breast, measuring 5.3 x 4.5 x 2.9 cm. Though  this could reflect diffuse cellulitis and underlying poorly characterized abscess as clinically suspected, would correlate clinically to exclude inflammatory breast cancer. 2. Asymmetric prominence of left axillary nodes, measuring up to 1.8 cm in short axis. Electronically Signed   By: Garald Balding M.D.   On: 03/08/2017 04:10   Dg Chest Port 1 View  Result Date: 03/08/2017 CLINICAL DATA:  Left breast swelling EXAM:  PORTABLE CHEST 1 VIEW COMPARISON:  04/29/2015 FINDINGS: The heart size and mediastinal contours are within normal limits. Both lungs are clear. The visualized skeletal structures are unremarkable. Mildly low lung volume IMPRESSION: No active disease. Electronically Signed   By: Donavan Foil M.D.   On: 03/08/2017 02:32    Microbiology: Recent Results (from the past 240 hour(s))  Blood culture (routine x 2)     Status: None (Preliminary result)   Collection Time: 03/07/17 10:10 PM  Result Value Ref Range Status   Specimen Description BLOOD LEFT ARM  Final   Special Requests IN PEDIATRIC BOTTLE Blood Culture adequate volume  Final   Culture   Final    NO GROWTH 1 DAY Performed at [facility], [place] [address], [place], [place] [address]    Report Status PENDING  Incomplete  Blood culture (routine x 2)     Status: None (Preliminary result)   Collection Time: 03/07/17 10:50 PM  Result Value Ref Range Status   Specimen Description BLOOD RIGHT ARM  Final   Special Requests   Final    BOTTLES DRAWN AEROBIC AND ANAEROBIC Blood Culture adequate volume   Culture   Final    NO GROWTH 1 DAY Performed at [facility], [place] [address], [place], [place] [address]    Report Status PENDING  Incomplete  Surgical PCR screen     Status: Abnormal   Collection Time: 03/09/17  7:54 AM  Result Value Ref Range Status   MRSA, PCR NEGATIVE NEGATIVE Final   Staphylococcus aureus POSITIVE (A) NEGATIVE Final    Comment: (NOTE) The Xpert SA Assay (FDA approved for NASAL specimens in patients 26 years of age and older), is one component of a comprehensive surveillance program. It is not intended to diagnose infection nor to guide or monitor treatment. Performed at [facility], [place] [address], [place], [place] [address]      Labs: Basic Metabolic Panel: Recent Labs  Lab 03/07/17 2300 03/08/17 0459 03/09/17 0352  NA 137 137 140  K 3.7 3.4* 3.8  CL 101 104 106  CO2 23 22 25   GLUCOSE  154* 109* 101*  BUN 6 6 <5*  CREATININE 0.79 0.81 0.70  CALCIUM 9.3 7.9* 8.5*   Liver Function Tests: Recent Labs  Lab 03/07/17 2300 03/08/17 0459  AST 14* 12*  ALT 12* 11*  ALKPHOS 55 44  BILITOT 0.7 0.6  PROT 8.0 6.2*  ALBUMIN 3.6 2.9*   No results for input(s): LIPASE, AMYLASE in the last 168 hours. No results for input(s): AMMONIA in the last 168 hours. CBC: Recent Labs  Lab 03/07/17 2300 03/08/17 0459 03/09/17 0352 03/10/17 0732  WBC 19.0* 16.7* 12.2* 14.6*  NEUTROABS 14.5* 11.4*  --   --   HGB 11.9* 9.9* 9.7* 10.7*  HCT 33.6* 28.5* 28.0* 30.1*  MCV 83.0 82.6 82.1 81.8  PLT 407* 355 379 437*   Cardiac Enzymes: No results for input(s): CKTOTAL, CKMB, CKMBINDEX, TROPONINI in the last 168 hours. BNP: BNP (last 3 results) No results for input(s): BNP in the last  8760 hours.  ProBNP (last 3 results) No results for input(s): PROBNP in the last 8760 hours.  CBG: Recent Labs  Lab 03/09/17 1616 03/09/17 2118 03/10/17 0004 03/10/17 0343 03/10/17 0819  GLUCAP 188* 160* 140* 93 96       Signed:  Bianca Raneri  [facility] 03/10/2017, 10:51 AM

## 2017-03-11 LAB — BPAM RBC
BLOOD PRODUCT EXPIRATION DATE: 201903032359
BLOOD PRODUCT EXPIRATION DATE: 201903032359
UNIT TYPE AND RH: 7300
Unit Type and Rh: 7300

## 2017-03-11 LAB — TYPE AND SCREEN
ABO/RH(D): B POS
Antibody Screen: POSITIVE
DONOR AG TYPE: NEGATIVE
DONOR AG TYPE: NEGATIVE
PT AG TYPE: NEGATIVE
UNIT DIVISION: 0
Unit division: 0

## 2017-03-13 LAB — CULTURE, BLOOD (ROUTINE X 2)
CULTURE: NO GROWTH
Culture: NO GROWTH
Special Requests: ADEQUATE
Special Requests: ADEQUATE

## 2017-03-15 ENCOUNTER — Telehealth: Payer: Self-pay | Admitting: Family Medicine

## 2017-03-15 NOTE — Telephone Encounter (Signed)
Copied from [place]. Topic: Quick Communication - See Telephone Encounter >> Mar 15, 2017  4:03 PM [place], Laban Emperor wrote: CRM for notification. See Telephone encounter for:  Kelly Hunter with [facility] wanted to let Dr. Tamala Julian know that she only been able to see the pt once this week for wound care. Pt refuse to be seen this week but is requesting to be seen next week.  Contact [phone]  03/15/17.

## 2017-03-16 NOTE — Telephone Encounter (Signed)
Phone message sent to Dr. Tamala Julian re: wound care

## 2017-03-19 NOTE — Telephone Encounter (Signed)
Noted.  Patient has hospital follow-up with me on 03/22/17 this week.

## 2017-03-21 ENCOUNTER — Telehealth: Payer: Self-pay | Admitting: Family Medicine

## 2017-03-21 NOTE — Telephone Encounter (Signed)
Called and spoke with pt to confirm apt tomorrow 03/22/17. Advised of time, building # and time policies. °

## 2017-03-22 ENCOUNTER — Ambulatory Visit: Payer: 59 | Admitting: Family Medicine

## 2017-03-22 ENCOUNTER — Encounter: Payer: Self-pay | Admitting: Family Medicine

## 2017-03-22 ENCOUNTER — Other Ambulatory Visit: Payer: Self-pay

## 2017-03-22 VITALS — BP 118/80 | HR 78 | Temp 98.0°F | Resp 16 | Ht 65.75 in | Wt 220.0 lb

## 2017-03-22 DIAGNOSIS — F32A Depression, unspecified: Secondary | ICD-10-CM

## 2017-03-22 DIAGNOSIS — B3731 Acute candidiasis of vulva and vagina: Secondary | ICD-10-CM

## 2017-03-22 DIAGNOSIS — N870 Mild cervical dysplasia: Secondary | ICD-10-CM | POA: Diagnosis not present

## 2017-03-22 DIAGNOSIS — E7439 Other disorders of intestinal carbohydrate absorption: Secondary | ICD-10-CM

## 2017-03-22 DIAGNOSIS — A419 Sepsis, unspecified organism: Secondary | ICD-10-CM | POA: Diagnosis not present

## 2017-03-22 DIAGNOSIS — B20 Human immunodeficiency virus [HIV] disease: Secondary | ICD-10-CM | POA: Diagnosis not present

## 2017-03-22 DIAGNOSIS — F419 Anxiety disorder, unspecified: Secondary | ICD-10-CM

## 2017-03-22 DIAGNOSIS — N900 Mild vulvar dysplasia: Secondary | ICD-10-CM | POA: Diagnosis not present

## 2017-03-22 DIAGNOSIS — B373 Candidiasis of vulva and vagina: Secondary | ICD-10-CM | POA: Diagnosis not present

## 2017-03-22 DIAGNOSIS — R87612 Low grade squamous intraepithelial lesion on cytologic smear of cervix (LGSIL): Secondary | ICD-10-CM | POA: Diagnosis not present

## 2017-03-22 DIAGNOSIS — Z3041 Encounter for surveillance of contraceptive pills: Secondary | ICD-10-CM | POA: Diagnosis not present

## 2017-03-22 DIAGNOSIS — A630 Anogenital (venereal) warts: Secondary | ICD-10-CM

## 2017-03-22 DIAGNOSIS — F329 Major depressive disorder, single episode, unspecified: Secondary | ICD-10-CM

## 2017-03-22 DIAGNOSIS — N611 Abscess of the breast and nipple: Secondary | ICD-10-CM | POA: Diagnosis not present

## 2017-03-22 LAB — GLUCOSE, POCT (MANUAL RESULT ENTRY): POC Glucose: 115 mg/dl — AB (ref 70–99)

## 2017-03-22 MED ORDER — NORETHINDRONE ACET-ETHINYL EST 1.5-30 MG-MCG PO TABS: 1.0000 | ORAL_TABLET | Freq: Every day | ORAL | 11 refills | Status: DC

## 2017-03-22 MED ORDER — FLUCONAZOLE 150 MG PO TABS: 150.0000 mg | ORAL_TABLET | Freq: Once | ORAL | 0 refills | Status: AC

## 2017-03-22 NOTE — Progress Notes (Signed)
Subjective:    Patient ID: [employer], female    DOB: [birth year], 27 y.o.   MRN: [id number]  03/22/2017  Transitions Of Care (pt was seen in the ER on 2/6 and was told to follow-up for Sepsis)    HPI This 27 y.o. female presents for evaluation for [employer]  for abscess breast.  Got really swollen and painful and red.  Presented to ED.  Skin started peeling.  Details of discharge summary:   Admit date: 03/08/2017 Discharge date: 03/10/2017 Time spent: 45 minutes Recommendations for Outpatient Follow-up:  Patient will be discharged to home with home health nursing.  Patient will need to follow up with primary care provider within one week of discharge.  Follow up with surgery in one week. Patient should continue medications as prescribed.  Patient should follow a carb modified diet.  Discharge Diagnoses:  Sepsis from left breast abscess History of HIV Diabetes mellitus, type II/hyperglycemia Anemia, normocytic Hypokalemia Discharge Condition: stable Diet recommendation: carb modified    Filed Weights   03/09/17 0830  Weight: 102.5 kg (226 lb)   History of present illness:  on 03/08/2017 by Dr. Barnett Abu T Jenkinsis a 27 y.o.femalewithhistory of HIV and previous history of diabetes off medications after patient's diabetes improved with weight loss presents to the ER with complaints of increasing pain in the left breast area. Pain is mostly in the inferior aspect of her left breast. Denies any discharge. Denies any trauma or insect bite. Denies any chest pain shortness of breath.Has been compliant with her antiretroviral. Hospital Course:  Sepsis from left breast abscess -Patient presented with low-grade fever, tachycardia, tachypnea, leukocytosis -Noted to have actively draining left breast abscess -General surgery consulted and appreciated,s/p I&D -was initially placed onZosyn and vancomycin, transitioned to unasyn -will discharge with  augmentin -patient will need to follow up with [facility] in one week History of HIV -Last CD4 count 2100 in June 2018 -ContinueTriumeq Diabetes mellitus, type II/hyperglycemia -Currently on no home medications- was told to watch her diet -review of chart showed A1c 6.9 in 2016 -Hemoglobin A1c5.5 Anemia, normocytic -Hemoglobin currently 10.7 Hypokalemia -resolved with replacement Procedures: I&D Consultations: General surgery Discharge Exam:     Vitals:   03/09/17 2252 03/10/17 0607  BP: 135/74 126/78  Pulse: 86 78  Resp: 18 18  Temp: 98.2 F (36.8 C) 98.2 F (36.8 C)  SpO2: 100% 97%    General: Well developed, well nourished, NAD, appears stated age  HEENT: NCAT, mucous membranes moist.  Cardiovascular: S1 S2 auscultated, no rubs, murmurs or gallops. Regular rate and rhythm.  Respiratory: Clear to auscultation bilaterally with equal chest rise  Abdomen: Soft, nontender, nondistended, + bowel sounds  Extremities: warm dry without cyanosis clubbing or edema  Neuro: AAOx3, nonfocal  Psych: Normal affect and demeanor with intact judgement and insight Discharge Instructions     Allergies as of 03/10/2017      Reactions   Other Nausea And Vomiting, Swelling   SWELLING REACTION UNSPECIFIED  Tree Nuts (Almonds)   Peanut Oil Nausea And Vomiting, Swelling   SWELLING REACTION UNSPECIFIED    Peanut-containing Drug Products Nausea And Vomiting, Swelling   SWELLING REACTION UNSPECIFIED               Medication List     TAKE these medications   abacavir-dolutegravir-lamiVUDine 600-50-300 MG tablet Commonly known as:  TRIUMEQ TAKE ONE TABLET BY MOUTH ONCE DAILY. STORE IN ORIGINAL BOTTLE AT [employer].   albuterol 108 (90 Base)  MCG/ACT inhaler Commonly known as:  PROVENTIL HFA;VENTOLIN HFA Inhale 2 puffs into the lungs every 6 (six) hours as needed. What changed:  reasons to take this   AMBULATORY NON FORMULARY MEDICATION Boric Acid Suppository 600  mg Insert 1 PV twice weekly to maintain vaginal flora What changed:    how much to take  how to take this  when to take this  reasons to take this  additional instructions   amoxicillin-clavulanate 875-125 MG tablet Commonly known as:  AUGMENTIN Take 1 tablet by mouth every 12 (twelve) hours.   imiquimod 5 % cream Commonly known as:  ALDARA Apply topically 3 (three) times a week. What changed:    how much to take  when to take this  reasons to take this   oxyCODONE-acetaminophen 5-325 MG tablet Commonly known as:  PERCOCET/ROXICET Take 1 tablet by mouth every 4 (four) hours as needed for moderate pain.   triamcinolone 0.1 % cream : eucerin Crea Use as directed What changed:    how much to take  how to take this  when to take this  additional instructions   valACYclovir 1000 MG tablet Commonly known as:  VALTREX TAKE 1 TABLET (1,000 MG TOTAL) BY MOUTH DAILY.      Did not complete Augmentin as prescribed because breast pain and swelling improved.  Did develop cold during admission; stayed hospital air and was fine.  Passed on to someone else.  Nurse aide visited after hospital follow-up; also came the next day.  No visits over weekend.  Advised to return.  Working at home.  Continues with daily dressing changes; wound continues to drain a yellow-green discharge. No fever/chills/sweats.  Swelling much improved.  Upcoming appointment with surgery.    Works for [employer]; working from home; quit [employer] in March; joined [employer] in November.  Went through emotional issues at the time; lots of job bouncing.  Moderately satisfied with [employer].  Able to take care of bills.  Puts a lot of pressure on self.  Seeing therapist; second visit.  Has appointment today.  Felt like getting depressed; does not handle state of mind well.  Realized emotions unstable.  No SI.  Tolerate terrible treatment. Expectations get broken. Little crying.    Last visit with  infectious disease provider in July 2018; return in one year recommended.   Gynecology none since last visit. Non-compliant with scheduling follow-up with gynecology as instructed at last visit; now agreeable to compliance with gynecology follow-up. History of cervical dysplasia and vulvar dysplasia; previously managed by Dr. Toney Hunter.   Sugars were stable during admission; has been working on weight loss and exercise.  Last visit with PCP in 01/2016.    BP Readings from Last 3 Encounters:  03/22/17 118/80  03/10/17 126/78  08/01/16 120/80   Wt Readings from Last 3 Encounters:  03/22/17 220 lb (99.8 kg)  03/09/17 226 lb (102.5 kg)  08/01/16 226 lb (102.5 kg)   Immunization History  Administered Date(s) Administered  . HPV 9-valent 08/05/2013, 12/16/2013  . HPV Quadrivalent 06/04/2013  . Hepatitis A, Adult 05/21/2014, 11/17/2014  . Influenza,inj,Quad PF,6+ Mos 11/18/2013, 11/17/2014, 01/06/2016, 10/17/2016  . Meningococcal Mcv4o 08/01/2016, 10/17/2016  . Pneumococcal Polysaccharide-23 12/31/2013  . Tdap 11/18/2013    Review of Systems  Constitutional: Negative for chills, diaphoresis, fatigue and fever.  Eyes: Negative for visual disturbance.  Respiratory: Negative for cough and shortness of breath.   Cardiovascular: Negative for chest pain, palpitations and leg swelling.  Gastrointestinal: Negative for  abdominal pain, constipation, diarrhea, nausea and vomiting.  Endocrine: Negative for cold intolerance, heat intolerance, polydipsia, polyphagia and polyuria.  Genitourinary: Positive for vaginal discharge. Negative for vaginal bleeding and vaginal pain.  Skin: Positive for color change, rash and wound.  Neurological: Negative for dizziness, tremors, seizures, syncope, facial asymmetry, speech difficulty, weakness, light-headedness, numbness and headaches.  Psychiatric/Behavioral: Positive for dysphoric mood. Negative for self-injury, sleep disturbance and suicidal ideas. The  patient is not nervous/anxious.     Past Medical History:  Diagnosis Date  . Acne   . Allergy    Allegra  . Asthma    mild; onset in childhood.  No hospitalizations.  . Chlamydia 03/03/2012  . CIN I (cervical intraepithelial neoplasia I) 08/31/2013   colposcopy by [employer]; s/p CO2 laser treatment.  . Eczema   . Genital warts 08/31/2013   Aldara treatment; [employer].  [place] Kitchen Herpes   . Hidradenitis   . Hidradenitis axillaris 05/21/2014  . HIV (human immunodeficiency virus infection) ([place])   . Substance abuse ([place])   . Type 2 diabetes mellitus without complication, without long-term current use of insulin ([place]) 11/17/2014  . VIN I (vulvar intraepithelial neoplasia I) 08/31/2013   s/p CO2 treatment; [facility]/gyn.   Past Surgical History:  Procedure Laterality Date  . BREAST SURGERY  02/01/2008   Reduction  . CO2 LASER APPLICATION N/A 1/60/7371   Procedure: C02 laser of vagina and cervix;  Surgeon: Terrance Mass, MD;  Location: [facility];  Service: Gynecology;  Laterality: N/A;  . INCISION AND DRAINAGE ABSCESS Left 03/09/2017   Procedure: INCISION AND DRAINAGE ABSCESS LEFT BREAST ABSCESS;  Surgeon: Erroll Luna, MD;  Location: [facility];  Service: General;  Laterality: Left;   Allergies  Allergen Reactions  . Other Nausea And Vomiting and Swelling    SWELLING REACTION UNSPECIFIED  Tree Nuts (Almonds)  . Peanut Oil Nausea And Vomiting and Swelling    SWELLING REACTION UNSPECIFIED   . Peanut-Containing Drug Products Nausea And Vomiting and Swelling    SWELLING REACTION UNSPECIFIED    Current Outpatient Medications on File Prior to Visit  Medication Sig Dispense Refill  . abacavir-dolutegravir-lamiVUDine (TRIUMEQ) 600-50-300 MG tablet TAKE ONE TABLET BY MOUTH ONCE DAILY. STORE IN ORIGINAL BOTTLE AT [facility]. 90 tablet 3  . albuterol (PROVENTIL HFA;VENTOLIN HFA) 108 (90 Base) MCG/ACT inhaler Inhale 2 puffs into the lungs every 6 (six) hours as needed. (Patient taking differently: Inhale  2 puffs into the lungs every 6 (six) hours as needed for wheezing or shortness of breath. ) 1 Inhaler 1  . AMBULATORY NON FORMULARY MEDICATION Boric Acid Suppository 600 mg Insert 1 PV twice weekly to maintain vaginal flora (Patient taking differently: Take 600 mg by mouth daily as needed (BV). ) 10 suppository 5  . imiquimod (ALDARA) 5 % cream Apply topically 3 (three) times a week. (Patient taking differently: Apply 1 application topically daily as needed (skin care). ) 12 each 1  . Triamcinolone Acetonide (TRIAMCINOLONE 0.1 % CREAM : EUCERIN) CREA Use as directed (Patient taking differently: Apply 1 application topically daily. Use as directed) 480 each 11  . valACYclovir (VALTREX) 1000 MG tablet TAKE 1 TABLET (1,000 MG TOTAL) BY MOUTH DAILY. 30 tablet 5   No current facility-administered medications on file prior to visit.    Social History   Socioeconomic History  . Marital status: Single    Spouse name: n/a  . Number of children: 0  . Years of education: Not on file  . Highest education level: Not on file  Social Needs  . Financial resource strain: Not on file  . Food insecurity - worry: Not on file  . Food insecurity - inability: Not on file  . Transportation needs - medical: Not on file  . Transportation needs - non-medical: Not on file  Occupational History  . Occupation: Freight forwarder    Comment: [employer]  Tobacco Use  . Smoking status: Never Smoker  . Smokeless tobacco: Never Used  Substance and Sexual Activity  . Alcohol use: Yes    Alcohol/week: 0.0 oz    Comment: socially  . Drug use: No  . Sexual activity: Yes    Partners: Male    Birth control/protection: Pill    Comment: 4 total sexual partners; history of Chlamydia in 03/2012. Genital warts 08/31/2013.  Other Topics Concern  . Not on file  Social History Narrative   Marital status: single;  Not dating in 2016.      Children: none; never pregnant.      Lives: with mom; dad in [place].     Employment: works full time at [employer]  as Freight forwarder; also working at [employer] as Freight forwarder.      Education: previously in school; took medical leave in 02/2012; [employer] in [place].      Tobacco: vapor cigarette two months. Pt does not smoke.       Alcohol:  Socially; weekends; no DUIs.      Drugs: marijuana daily.  Started at age 70.        Sexual activity: sexually active; 25 total partners; males; Chlamydia in 03/2012.  Genital warts 08/2013.  HIV diagnosis 12/2013.      Seatbelt: 100%      Guns:  none   Family History  Problem Relation Age of Onset  . Diabetes Mother   . Hypertension Mother   . Hyperlipidemia Mother   . Mental illness Mother   . Cancer Maternal Grandfather        ?       Objective:    BP 118/80   Pulse 78   Temp 98 F (36.7 C) (Oral)   Resp 16   Ht 5' 5.75" (1.67 m)   Wt 220 lb (99.8 kg)   LMP 03/01/2017   SpO2 98%   BMI 35.78 kg/m  Physical Exam  Constitutional: She is oriented to person, place, and time. She appears well-developed and well-nourished. No distress.  HENT:  Head: Normocephalic and atraumatic.  Right Ear: External ear normal.  Left Ear: External ear normal.  Nose: Nose normal.  Mouth/Throat: Oropharynx is clear and moist.  Eyes: Conjunctivae and EOM are normal. Pupils are equal, round, and reactive to light.  Neck: Normal range of motion. Neck supple. Carotid bruit is not present. No thyromegaly present.  Cardiovascular: Normal rate, regular rhythm, normal heart sounds and intact distal pulses. Exam reveals no gallop and no friction rub.  No murmur heard. Pulmonary/Chest: Effort normal and breath sounds normal. She has no wheezes. She has no rales.    L inferior medial breast at 7 o'clock with open wound 51mmx 4 mm with active yellow-green drainage present.  Minimal induration surrounding wound 4 cm diameter; minimal erythema.  No fluctuance.  Lymphadenopathy:    She has no cervical adenopathy.  Neurological: She is alert and oriented to person, place, and time. No cranial  nerve deficit.  Skin: Skin is warm and dry. No rash noted. She is not diaphoretic. No erythema. No pallor.  Psychiatric: She has a normal mood and affect. Her  behavior is normal.   No results found. Depression screen [facility] 2/9 03/22/2017 08/01/2016 01/06/2016 08/10/2015 04/29/2015  Decreased Interest 0 0 0 0 0  Down, Depressed, Hopeless 0 0 0 0 0  PHQ - 2 Score 0 0 0 0 0  Altered sleeping - - - - -  Tired, decreased energy - - - - -  Change in appetite - - - - -  Feeling bad or failure about yourself  - - - - -  Trouble concentrating - - - - -  Moving slowly or fidgety/restless - - - - -  Suicidal thoughts - - - - -  PHQ-9 Score - - - - -   Fall Risk  03/22/2017 08/01/2016 01/06/2016 08/10/2015 01/13/2015  Falls in the past year? No No No Yes No  Number falls in past yr: - - - 1 -  Injury with Fall? - - - Yes -  Follow up - - - Falls evaluation completed;Education provided -   PROCEDURE: VERBAL CONSENT; WOUND DRESSING REMOVED; WOUND CLEANSED WITH STERILE SALINE; DRESSING PLACED. PT TOLERATED PROCEDURE WELL.     Assessment & Plan:   1. Breast abscess   2. Sepsis, due to unspecified organism ([place])   3. HIV disease ([place])   4. LGSIL of cervix of undetermined significance   5. Glucose intolerance   6. VIN I (vulvar intraepithelial neoplasia I)   7. Condyloma acuminatum of vulva   8. Dysplasia of cervix, low grade (CIN 1)   9. Encounter for surveillance of contraceptive pills   10. Vulvar candidiasis   11. Anxiety and depression    LEFT breast abscess with sepsis:  Improving; s/p admission for 48 hours; s/p I&D by surgery; blood cx negative. No wound culture on chart.  Recommend patient complete Augmentin therapy due to ongoing active drainage.  Follow-up with general surgery in upcoming week; continue with daily dressing changes and daily cleansing.  HIV disease: controlled; followed by infectious disease; continue Triumeq.  Followed yearly by ID.   LGSIL cervix and vulvar dysplasia hx:  refer to gynecology for ongoing care.  Glucose intolerance: stable; glucose 115 in office; HgbA1c normal during admission on 03/08/17 of 5.5.    Anxiety and depression: patient has undergone significant personal stressors in the past year; has been undergoing psychotherapy with benefit; doing well emotionally at this time.   -prolonged face-to-face for 40 minutes with greater than 50% of time dedicated to counseling and coordination of care.  Orders Placed This Encounter  Procedures  . Microalbumin / creatinine urine ratio  . Ambulatory referral to Gynecology    Referral Priority:   Routine    Referral Type:   Consultation    Referral Reason:   Specialty Services Required    Requested Specialty:   Gynecology    Number of Visits Requested:   1  . POCT glucose (manual entry)   Meds ordered this encounter  Medications  . Norethindrone Acetate-Ethinyl Estradiol (JUNEL,LOESTRIN,MICROGESTIN) 1.5-30 MG-MCG tablet    Sig: Take 1 tablet by mouth daily.    Dispense:  1 Package    Refill:  11  . fluconazole (DIFLUCAN) 150 MG tablet    Sig: Take 1 tablet (150 mg total) by mouth once for 1 dose. Repeat if needed    Dispense:  2 tablet    Refill:  0    Return in about 3 months (around 06/19/2017).   Haji Delaine Elayne Guerin, M.D. Primary Care at [facility] previously [facility] [address] [place], [place]  [address] [phone] phone [phone] fax

## 2017-03-22 NOTE — Patient Instructions (Addendum)
  Restart    IF you received an x-ray today, you will receive an invoice from [facility]. Please contact [facility] at [phone] with questions or concerns regarding your invoice.   IF you received labwork today, you will receive an invoice from [facility]. Please contact [facility] at [phone] with questions or concerns regarding your invoice.   Our billing staff will not be able to assist you with questions regarding bills from these companies.  You will be contacted with the lab results as soon as they are available. The fastest way to get your results is to activate your My Chart account. Instructions are located on the last page of this paperwork. If you have not heard from [place] regarding the results in 2 weeks, please contact this office.

## 2017-03-23 LAB — MICROALBUMIN / CREATININE URINE RATIO
Creatinine, Urine: 69.9 mg/dL
Microalb/Creat Ratio: 22.6 mg/g creat (ref 0.0–30.0)
Microalbumin, Urine: 15.8 ug/mL

## 2017-04-03 ENCOUNTER — Telehealth: Payer: Self-pay | Admitting: Obstetrics and Gynecology

## 2017-04-03 NOTE — Telephone Encounter (Signed)
Called but could not leave a message for patient to call back to schedule a new patient doctor referral appointment with our office, patient's voice mail is full. Patient is already an established patient at [facility]. Confirm patient wants to establish care at [facility].

## 2017-04-07 ENCOUNTER — Telehealth: Payer: Self-pay | Admitting: Family Medicine

## 2017-04-07 NOTE — Telephone Encounter (Signed)
Called pt and spoke to her - rescheduled her appt to 09/18/17 due to Dr. Tamala Julian being out of the office.

## 2017-04-10 ENCOUNTER — Ambulatory Visit (INDEPENDENT_AMBULATORY_CARE_PROVIDER_SITE_OTHER): Payer: 59 | Admitting: Obstetrics and Gynecology

## 2017-04-10 ENCOUNTER — Other Ambulatory Visit (HOSPITAL_COMMUNITY)
Admission: RE | Admit: 2017-04-10 | Discharge: 2017-04-10 | Disposition: A | Discharge status: Home / Self Care | Payer: 59 | Source: Ambulatory Visit | Attending: Obstetrics and Gynecology | Admitting: Obstetrics and Gynecology

## 2017-04-10 ENCOUNTER — Encounter: Payer: Self-pay | Admitting: Obstetrics and Gynecology

## 2017-04-10 ENCOUNTER — Other Ambulatory Visit: Payer: Self-pay

## 2017-04-10 VITALS — BP 118/70 | HR 68 | Resp 16 | Ht 62.5 in | Wt 219.0 lb

## 2017-04-10 DIAGNOSIS — Z01419 Encounter for gynecological examination (general) (routine) without abnormal findings: Secondary | ICD-10-CM | POA: Insufficient documentation

## 2017-04-10 DIAGNOSIS — Z113 Encounter for screening for infections with a predominantly sexual mode of transmission: Secondary | ICD-10-CM | POA: Insufficient documentation

## 2017-04-10 NOTE — Patient Instructions (Signed)

## 2017-04-10 NOTE — Progress Notes (Signed)
27 y.o. [id number] Single African American female here as a new patient for annual exam.  Patient has a history of abnormal pap smears. Patient has seen Dr. Toney Rakes at [facility] in the past. Due for AEX  Just started on OCPs through PCP.   Recent left breast abscess.   Works for [employer] from home.   UPT - negative.  PCP:   Reginia Forts, MD  Patient's last menstrual period was 02/28/2017 (within weeks).           Sexually active: Yes.    The current method of family planning is JUNEL and condoms sometimes.    Exercising: Yes.    walking Smoker:  Yes, marijuana  Health Maintenance: Pap:  About 2 years ago per patient -- abnormal History of abnormal Pap:  yes TDaP:  11/18/13 Gardasil:   Yes, completed series HIV: patient has HIV currently Hep C: 12/31/13 Negative Screening Labs:  PCP   reports that  has never smoked. she has never used smokeless tobacco. She reports that she drinks alcohol. She reports that she does not use drugs.  Past Medical History:  Diagnosis Date  . Acne   . Allergy    [place]  . Asthma    mild; onset in childhood.  No hospitalizations.  . Chlamydia 03/03/2012  . CIN I (cervical intraepithelial neoplasia I) 08/31/2013   colposcopy by Toney Rakes; s/p CO2 laser treatment.  . Eczema   . Genital warts 08/31/2013   Aldara treatment; Toney Rakes.  Marland Kitchen Herpes   . Hidradenitis   . Hidradenitis axillaris 05/21/2014  . HIV (human immunodeficiency virus infection) ([place])   . HSV-2 seropositive   . Migraine headache without aura   . Substance abuse ([place])   . Type 2 diabetes mellitus without complication, without long-term current use of insulin ([place]) 11/17/2014  . VIN I (vulvar intraepithelial neoplasia I) 08/31/2013   s/p CO2 treatment; [facility]/gyn.    Past Surgical History:  Procedure Laterality Date  . BREAST SURGERY  02/01/2008   Reduction  . CO2 LASER APPLICATION N/A 0/09/6759   Procedure: C02 laser of vagina and cervix;  Surgeon: Terrance Mass, MD;   Location: [facility];  Service: Gynecology;  Laterality: N/A;  . INCISION AND DRAINAGE ABSCESS Left 03/09/2017   Procedure: INCISION AND DRAINAGE ABSCESS LEFT BREAST ABSCESS;  Surgeon: Erroll Luna, MD;  Location: [place];  Service: General;  Laterality: Left;    Current Outpatient Medications  Medication Sig Dispense Refill  . abacavir-dolutegravir-lamiVUDine (TRIUMEQ) 600-50-300 MG tablet TAKE ONE TABLET BY MOUTH ONCE DAILY. STORE IN ORIGINAL BOTTLE AT [facility]. 90 tablet 3  . Norethindrone Acetate-Ethinyl Estradiol (JUNEL,LOESTRIN,MICROGESTIN) 1.5-30 MG-MCG tablet Take 1 tablet by mouth daily. 1 Package 11  . Triamcinolone Acetonide (TRIAMCINOLONE 0.1 % CREAM : EUCERIN) CREA Use as directed (Patient taking differently: Apply 1 application topically daily. Use as directed) 480 each 11  . valACYclovir (VALTREX) 1000 MG tablet TAKE 1 TABLET (1,000 MG TOTAL) BY MOUTH DAILY. 30 tablet 5  . albuterol (PROVENTIL HFA;VENTOLIN HFA) 108 (90 Base) MCG/ACT inhaler Inhale 2 puffs into the lungs every 6 (six) hours as needed. (Patient not taking: Reported on 04/10/2017) 1 Inhaler 1  . imiquimod (ALDARA) 5 % cream Apply topically 3 (three) times a week. (Patient not taking: Reported on 04/10/2017) 12 each 1   No current facility-administered medications for this visit.     Family History  Problem Relation Age of Onset  . Diabetes Mother   . Hypertension Mother   . Hyperlipidemia Mother   .  Mental illness Mother   . Cancer Maternal Grandfather        ?  Marland Kitchen Cancer Paternal Aunt        Cervical or Ovarian per patient    ROS:  Pertinent items are noted in HPI.  Otherwise, a comprehensive ROS was negative.  Exam:   BP 118/70 (BP Location: Right Arm, Patient Position: Sitting, Cuff Size: Normal)   Pulse 68   Resp 16   Ht 5' 2.5" (1.588 m)   Wt 219 lb (99.3 kg)   LMP 02/28/2017 (Within Weeks)   BMI 39.42 kg/m     General appearance: alert, cooperative and appears stated age Head: Normocephalic,  without obvious abnormality, atraumatic Neck: no adenopathy, supple, symmetrical, trachea midline and thyroid normal to inspection and palpation Lungs: clear to auscultation bilaterally Breasts:  Bandage on left lower breast, consistent with bilateral reduction, no masses or tenderness, No nipple retraction or dimpling, No nipple discharge or bleeding, No axillary or supraclavicular adenopathy Heart: regular rate and rhythm Abdomen: soft, non-tender; no masses, no organomegaly Extremities: extremities normal, atraumatic, no cyanosis or edema Skin: Skin color, texture, turgor normal. No rashes or lesions Lymph nodes: Cervical, supraclavicular, and axillary nodes normal. No abnormal inguinal nodes palpated Neurologic: Grossly normal  Pelvic: External genitalia:  no lesions              Urethra:  normal appearing urethra with no masses, tenderness or lesions              Bartholins and Skenes: normal                 Vagina: normal appearing vagina with normal color and discharge, no lesions              Cervix: no lesions              Pap taken: Yes.   Bimanual Exam:  Uterus:  normal size, contour, position, consistency, mobility, non-tender              Adnexa: no mass, fullness, tenderness          Chaperone was present for exam.  Assessment:   Well woman visit with normal exam. Hx CIN I and VIN I.  Status post CO2 laser therapy.  HIV positive.  HSV 2.  On Valtrex. Hx chlamydia.  Recent left breast abscess drained. Migraines without aura.   Plan: Mammogram screening discussed. Recommended self breast awareness. Pap and HR HPV as above. Guidelines for Calcium, Vitamin D, regular exercise program including cardiovascular and weight bearing exercise. GC/CT, trich, BV, yeast, Hep B and C testing.  She does her RPR through her ID physician. I recommended condom use.  OCPs through PCP. Follow up annually and prn.   After visit summary provided.

## 2017-04-11 LAB — HEPATITIS C ANTIBODY

## 2017-04-11 LAB — HEPATITIS B SURFACE ANTIGEN: HEP B S AG: NEGATIVE

## 2017-04-12 LAB — CYTOLOGY - PAP
Bacterial vaginitis: POSITIVE — AB
Candida vaginitis: POSITIVE — AB
Chlamydia: NEGATIVE
Diagnosis: UNDETERMINED — AB
HPV (WINDOPATH): NOT DETECTED
Neisseria Gonorrhea: NEGATIVE
TRICH (WINDOWPATH): NEGATIVE

## 2017-04-14 ENCOUNTER — Other Ambulatory Visit: Payer: Self-pay | Admitting: *Deleted

## 2017-04-14 ENCOUNTER — Telehealth: Payer: Self-pay | Admitting: *Deleted

## 2017-04-14 ENCOUNTER — Telehealth: Payer: Self-pay | Admitting: Obstetrics and Gynecology

## 2017-04-14 DIAGNOSIS — R87610 Atypical squamous cells of undetermined significance on cytologic smear of cervix (ASC-US): Secondary | ICD-10-CM

## 2017-04-14 MED ORDER — FLUCONAZOLE 150 MG PO TABS: ORAL_TABLET | ORAL | 0 refills | Status: DC

## 2017-04-14 MED ORDER — METRONIDAZOLE 500 MG PO TABS: 500.0000 mg | ORAL_TABLET | Freq: Two times a day (BID) | ORAL | 0 refills | Status: DC

## 2017-04-14 NOTE — Telephone Encounter (Signed)
Placed call to convey benefits. °

## 2017-04-14 NOTE — Telephone Encounter (Signed)
Notes recorded by Burnice Logan, RN on 04/14/2017 at 11:18 AM EDT Spoke with patient, advised as seen below per Dr. Quincy Simmonds. Rx for Flagyl and diflucan to [facility] [address] LMP 04/14/17. Contraceptive OCP. Colpo scheduled for 05/01/17 at 10am with Dr. Quincy Simmonds. Advised to take Motrin 800 mg with food and water one hour before procedure. Patient verbalizes understanding and is agreeable.   Order placed for colposcopy. See telephone encounter dated 3/15 to review with provider. ------  Routing to Dr. Antony Blackbird and [place] for precert. Will close encounter.   Cc: Magdalene Patricia

## 2017-04-14 NOTE — Telephone Encounter (Signed)
-----   Message from Nunzio Cobbs, MD sent at 04/14/2017  8:53 AM EDT ----- Please inform patient of results.  Her pap shows ASCUS and negative HR HPV.  She has a history of LGSIL and this is her first pap follow up.  Due to the ASCUS, she needs a colposcopy with me.  Please send to precert.   Prior to the colpo, she needs to treat both BV and yeast. She may treat with Flagyl 500 mg po bid for 7 days or Metrogel pv at hs for 5 nights.  Please send Rx to pharmacy of choice. ETOH precautions.   For the yeast, she can treat with Diflucan 150 mg po x 1 and repeat after the BV is treated. Dispense 2, RF none.   Testing is negative for GC, chlamydia, and trichomonas.

## 2017-04-27 ENCOUNTER — Telehealth: Payer: Self-pay | Admitting: Family Medicine

## 2017-04-27 NOTE — Telephone Encounter (Signed)
Copied from [place] [id number]. Topic: Quick Communication - See Telephone Encounter >> Apr 27, 2017  6:01 PM Ivar Drape wrote: CRM for notification. See Telephone encounter for: 04/27/17. Patient's prescription for Norethindrone Acetate-Ethinyl Estradiol (JUNEL,LOESTRIN,MICROGESTIN) 1.5-30 MG-MCG tablet has to be written for a 19mth supply at a time for her insurance.  So a new prescription will have to be sent to [facility] [address].

## 2017-04-28 ENCOUNTER — Other Ambulatory Visit: Payer: Self-pay

## 2017-04-28 MED ORDER — NORETHINDRONE ACET-ETHINYL EST 1.5-30 MG-MCG PO TABS: 1.0000 | ORAL_TABLET | Freq: Every day | ORAL | 4 refills | Status: DC

## 2017-04-28 NOTE — Telephone Encounter (Signed)
rx ordered for 93mo supply

## 2017-05-01 ENCOUNTER — Ambulatory Visit (INDEPENDENT_AMBULATORY_CARE_PROVIDER_SITE_OTHER): Payer: 59 | Admitting: Obstetrics and Gynecology

## 2017-05-01 ENCOUNTER — Encounter: Payer: Self-pay | Admitting: Obstetrics and Gynecology

## 2017-05-01 ENCOUNTER — Other Ambulatory Visit: Payer: Self-pay

## 2017-05-01 VITALS — BP 120/76 | HR 80 | Resp 14 | Ht 62.5 in | Wt 223.0 lb

## 2017-05-01 DIAGNOSIS — R87610 Atypical squamous cells of undetermined significance on cytologic smear of cervix (ASC-US): Secondary | ICD-10-CM

## 2017-05-01 DIAGNOSIS — Z01812 Encounter for preprocedural laboratory examination: Secondary | ICD-10-CM | POA: Diagnosis not present

## 2017-05-01 LAB — POCT URINE PREGNANCY: PREG TEST UR: NEGATIVE

## 2017-05-01 NOTE — Progress Notes (Signed)
Subjective:     Patient ID: Kelly Hunter, female   DOB: Sep 08, 1990, 27 y.o.   MRN: [id number]  HPI  Pap History: 04/10/17 ASCUS and negative HR HPV Patient has a history of LGSIL and VIN 1.  Had laser tx in 2015 with Dr. Toney Rakes.    Treated with Flagyl and Diflucan for BV prior to visit.   HIV positive.   Review of Systems  LMP: 04/14/17  Contraception: OCP UPT: negative      Objective:   Physical Exam  Genitourinary:        Colposcopy Consent for procedure.   3% acetic acid used on cervix. Satisfactory colposcopy.  ECC taken and to pathology.  Small ulceration at 9:00.  Biopsy taken here and to pathology.  Monsel's placed.  Minimal EBL.  No complications.   3% acetic acid to vulva with gauze pads. No lesions noted.     Assessment:     ASCUS pap and negative HR HPV.  Hx prior LGSIL and VIN 1.  HIV positive patient.     Plan:     Reviewed pap and prior pathology findings.  FU biopsy results.  I anticipate pap follow up.      After visit summary to patient.

## 2017-05-01 NOTE — Patient Instructions (Signed)
Colposcopy, Care After  This sheet gives you information about how to care for yourself after your procedure. Your doctor may also give you more specific instructions. If you have problems or questions, contact your doctor.  What can I expect after the procedure?  If you did not have a tissue sample removed (did not have a biopsy), you may only have some spotting for a few days. You can go back to your normal activities.  If you had a tissue sample removed, it is common to have:  · Soreness and pain. This may last for a few days.  · Light-headedness.  · Mild bleeding from your vagina or dark-colored, grainy discharge from your vagina. This may last for a few days. You may need to wear a sanitary pad.  · Spotting for at least 48 hours after the procedure.    Follow these instructions at home:  · Take over-the-counter and prescription medicines only as told by your doctor. Ask your doctor what medicines you can start taking again. This is very important if you take blood-thinning medicine.  · Do not drive or use heavy machinery while taking prescription pain medicine.  · For 3 days, or as long as your doctor tells you, avoid:  ? Douching.  ? Using tampons.  ? Having sex.  · If you use birth control (contraception), keep using it.  · Limit activity for the first day after the procedure. Ask your doctor what activities are safe for you.  · It is up to you to get the results of your procedure. Ask your doctor when your results will be ready.  · Keep all follow-up visits as told by your doctor. This is important.  Contact a doctor if:  · You get a skin rash.  Get help right away if:  · You are bleeding a lot from your vagina. It is a lot of bleeding if you are using more than one pad an hour for 2 hours in a row.  · You have clumps of blood (blood clots) coming from your vagina.  · You have a fever.  · You have chills  · You have pain in your lower belly (pelvic area).  · You have signs of infection, such as vaginal  discharge that is:  ? Different than usual.  ? Yellow.  ? Bad-smelling.  · You have very pain or cramps in your lower belly that do not get better with medicine.  · You feel light-headed.  · You feel dizzy.  · You pass out (faint).  Summary  · If you did not have a tissue sample removed (did not have a biopsy), you may only have some spotting for a few days. You can go back to your normal activities.  · If you had a tissue sample removed, it is common to have mild pain and spotting for 48 hours.  · For 3 days, or as long as your doctor tells you, avoid douching, using tampons and having sex.  · Get help right away if you have bleeding, very bad pain, or signs of infection.  This information is not intended to replace advice given to you by your health care provider. Make sure you discuss any questions you have with your health care provider.  Document Released: 07/06/2007 Document Revised: 10/07/2015 Document Reviewed: 10/07/2015  Elsevier Interactive Patient Education © 2018 Elsevier Inc.

## 2017-05-04 ENCOUNTER — Encounter: Payer: Self-pay | Admitting: Obstetrics and Gynecology

## 2017-05-08 ENCOUNTER — Telehealth: Payer: Self-pay | Admitting: Obstetrics and Gynecology

## 2017-05-08 NOTE — Telephone Encounter (Signed)
Patient says she is returning a call toTaylor from Friday. No telephone call in system.

## 2017-05-08 NOTE — Telephone Encounter (Addendum)
Spoke with patient, advised of results as seen below per Dr. Quincy Simmonds. Patient verbalizes understanding and is agreeable. Will close encounter.   08 recall placed.  Notes recorded by Nunzio Cobbs, MD on 05/04/2017 at 7:27 PM EDT Please report results of colposcopy to patient showing LGSIL of the cervix. Her ECC was negative.  No cancer was seen. By ASCCP guidelines, she will be due for a pap and HR HPV in 1 year.  Please place 08 recall.   Routing to T. Alroy Dust, CMA

## 2017-05-25 ENCOUNTER — Encounter: Payer: Self-pay | Admitting: Family Medicine

## 2017-06-07 ENCOUNTER — Other Ambulatory Visit: Payer: Self-pay | Admitting: Infectious Disease

## 2017-06-07 DIAGNOSIS — A6000 Herpesviral infection of urogenital system, unspecified: Secondary | ICD-10-CM

## 2017-06-15 ENCOUNTER — Encounter: Payer: Self-pay | Admitting: Family Medicine

## 2017-06-16 ENCOUNTER — Encounter: Payer: Self-pay | Admitting: Family Medicine

## 2017-06-19 ENCOUNTER — Encounter: Payer: Self-pay | Admitting: Obstetrics and Gynecology

## 2017-06-20 ENCOUNTER — Encounter: Payer: Self-pay | Admitting: Urgent Care

## 2017-06-20 ENCOUNTER — Ambulatory Visit: Payer: Self-pay | Admitting: *Deleted

## 2017-06-20 ENCOUNTER — Other Ambulatory Visit: Payer: Self-pay

## 2017-06-20 ENCOUNTER — Telehealth: Payer: Self-pay | Admitting: Obstetrics and Gynecology

## 2017-06-20 ENCOUNTER — Ambulatory Visit (INDEPENDENT_AMBULATORY_CARE_PROVIDER_SITE_OTHER): Payer: 59 | Admitting: Urgent Care

## 2017-06-20 VITALS — BP 130/80 | HR 125 | Temp 98.7°F | Resp 18 | Ht 62.5 in | Wt 210.8 lb

## 2017-06-20 DIAGNOSIS — N3090 Cystitis, unspecified without hematuria: Secondary | ICD-10-CM

## 2017-06-20 DIAGNOSIS — L299 Pruritus, unspecified: Secondary | ICD-10-CM

## 2017-06-20 DIAGNOSIS — B20 Human immunodeficiency virus [HIV] disease: Secondary | ICD-10-CM

## 2017-06-20 DIAGNOSIS — R51 Headache: Secondary | ICD-10-CM | POA: Diagnosis not present

## 2017-06-20 DIAGNOSIS — R21 Rash and other nonspecific skin eruption: Secondary | ICD-10-CM | POA: Diagnosis not present

## 2017-06-20 DIAGNOSIS — R519 Headache, unspecified: Secondary | ICD-10-CM

## 2017-06-20 MED ORDER — HYDROXYZINE HCL 25 MG PO TABS: 25.0000 mg | ORAL_TABLET | Freq: Three times a day (TID) | ORAL | 0 refills | Status: DC | PRN

## 2017-06-20 MED ORDER — PREDNISONE 20 MG PO TABS: ORAL_TABLET | ORAL | 0 refills | Status: DC

## 2017-06-20 NOTE — Telephone Encounter (Signed)
Patient sent the following correspondence through [place]. Routing to triage to assist patient with request.  ----- Message from [place], Generic sent at 06/19/2017 5:10 PM EDT -----    Hello Dr. Quincy Simmonds,    The birth control I've been taking. I haven't had a period since the end of March. Is that normal?

## 2017-06-20 NOTE — Progress Notes (Signed)
    MRN: [id number] DOB: 1991/01/15  Subjective:   Kelly Hunter is a 27 y.o. female presenting for 4 day history of frontal headache with associated dizziness. Has had vertigo, nausea with vomiting. Symptoms started after taking pyridium. She is also taking Bactrim. Patient is hydrating aggressively. Patient has seasonal allergies, is not having a difficult time with sinus pain, sinus congestion, ear pain, ear drainage, sore throat, cough. Denies trauma, falls. Patient was contacted by the [facility] where she was treated for UTI, was advised to stop pyridium, last dose was 06/17/2017. Her dizziness has improved, headaches have persisted. Has tried ibuprofen for the headache with minimal relief.   Kelly Hunter has a current medication list which includes the following prescription(s): abacavir-dolutegravir-lamivudine, albuterol, ibuprofen, imiquimod, norethindrone acetate-ethinyl estradiol, sulfamethoxazole-trimethoprim, triamcinolone 0.1 % cream : eucerin, and valacyclovir. Also is allergic to other; peanut oil; peanut-containing drug products; and pyridium [phenazopyridine hcl].  Kelly Hunter  has a past medical history of Acne, Allergy, Asthma, Chlamydia (03/03/2012), CIN I (cervical intraepithelial neoplasia I) (08/31/2013, 05/01/17), Eczema, Genital warts (08/31/2013), Herpes, Hidradenitis, Hidradenitis axillaris (05/21/2014), HIV (human immunodeficiency virus infection) ([place]), HSV-2 seropositive, Migraine headache without aura, Substance abuse ([place]), Type 2 diabetes mellitus without complication, without long-term current use of insulin ([place]) (11/17/2014), and VIN I (vulvar intraepithelial neoplasia I) (08/31/2013). Also  has a past surgical history that includes Breast surgery (02/01/2008); Co2 laser application (N/A, 1/69/6789); and Incision and drainage abscess (Left, 03/09/2017).  Objective:   Vitals: BP 130/80   Pulse (!) 125   Temp 98.7 F (37.1 C) (Oral)   Resp 18   Ht 5' 2.5" (1.588 m)   Wt 210 lb 12.8  oz (95.6 kg)   SpO2 97%   BMI 37.94 kg/m   Physical Exam  Constitutional: She is oriented to person, place, and time. She appears well-developed and well-nourished.  HENT:  Right Ear: Tympanic membrane normal.  Left Ear: Tympanic membrane normal.  Nose: No mucosal edema or rhinorrhea.  Mouth/Throat: Oropharynx is clear and moist.  Eyes: Right eye exhibits no discharge. Left eye exhibits no discharge. No scleral icterus.  Neck: Normal range of motion. Neck supple.  Cardiovascular: Normal rate, regular rhythm and intact distal pulses. Exam reveals no gallop and no friction rub.  No murmur heard. Pulmonary/Chest: No respiratory distress. She has no wheezes. She has no rales.  Lymphadenopathy:    She has no cervical adenopathy.  Neurological: She is alert and oriented to person, place, and time. She displays normal reflexes. No cranial nerve deficit.  Skin: Skin is warm and dry. Rash (erythematous urticarial type rash over arms bilaterally and lower legs) noted.  Psychiatric: She has a normal mood and affect.   Assessment and Plan :   Acute nonintractable headache, unspecified headache type  Rash and nonspecific skin eruption  Itching  Cystitis  HIV disease ([place])  I suspect patient is undergoing a drug reaction to Bactrim which she was taken for urinary tract infection.  Counseled that she should stop this medication.  I will have patient start prednisone course with Vistaril for itching. Counseled patient on potential for adverse effects with medications prescribed today, patient verbalized understanding. Return-to-clinic precautions discussed, patient verbalized understanding.   Jaynee Eagles, PA-C [facility] [phone] 06/20/2017  6:11 PM

## 2017-06-20 NOTE — Telephone Encounter (Signed)
Pt seen last Thursday for Uti  [facility]  She was put on Anti biotics and med for urinary discomfort. She reports subsequently developed a headache  - she reports she stopped the urinary discomfort med and started taking ibuprofen.She reports chills and weakness App[facility] at [facility]  Reason for Disposition . [1] MODERATE headache (e.g., interferes with normal activities) AND [2] present > 24 hours AND [3] unexplained  (Exceptions: analgesics not tried, typical migraine, or headache part of viral illness)  Answer Assessment - Initial Assessment Questions 1. LOCATION: "Where does it hurt?"       Front and  Sides   2. ONSET: "When did the headache start?" (Minutes, hours or days)       4  Days   3. PATTERN: "Does the pain come and go, or has it been constant since it started?"     Constant  4. SEVERITY: "How bad is the pain?" and "What does it keep you from doing?"  (e.g., Scale 1-10; mild, moderate, or severe)   - MILD (1-3): doesn't interfere with normal activities    - MODERATE (4-7): interferes with normal activities or awakens from sleep    - SEVERE (8-10): excruciating pain, unable to do any normal activities         10   5. RECURRENT SYMPTOM: "Have you ever had headaches before?" If so, ask: "When was the last time?" and "What happened that time?"        Not this  Bad  6. CAUSE: "What do you think is causing the headache?"       Not sure   7. MIGRAINE: "Have you been diagnosed with migraine headaches?" If so, ask: "Is this headache similar?"      No  8. HEAD INJURY: "Has there been any recent injury to the head?"      No  9. OTHER SYMPTOMS: "Do you have any other symptoms?" (fever, stiff neck, eye pain, sore throat, cold symptoms)     Chills   Weak  And  Back pain  Recently seen for uti   10. PREGNANCY: "Is there any chance you are pregnant?" "When was your last menstrual period?"      End of March  Taking BCP  Protocols used: HEADACHE-A-AH

## 2017-06-20 NOTE — Patient Instructions (Addendum)
You may take 542m Tylenol every 6 hours for pain and inflammation.     Drug Allergy A drug allergy is when the body's disease-fighting system (immune system) reacts badly to a medicine. Drug allergies range from mild to severe, and they can be life-threatening in some cases. Some allergic reactions occur one week or more after you are exposed to a medicine (delayed reaction). A sudden (acute), severe allergic reaction that affects multiple areas of the body is called an anaphylactic reaction (anaphylaxis). Anaphylaxis can be life-threatening. All allergic reactions to a medicine require medical evaluation, even if an allergic reaction appears to be mild (minor). What are the causes? Drug allergies happen when the immune system wrongly identifies a medicine as being harmful. When this happens, the body releases proteins (antibodies) and other compounds, such as histamine, into the bloodstream. This causes swelling in certain tissues and loss of blood pressure to important areas, such as the heart and lungs. Almost any medicine can cause an allergic reaction. Medicines that commonly cause allergic reactions (are common allergens) include:  Penicillin.  Sulfa medicines (sulfonamides).  Medicines that numb certain areas of the body (local anesthetics).  X-ray dyes that contain iodine.  What are the signs or symptoms? Mild Allergic Reaction  Nasal congestion.  Tingling in the mouth.  An itchy, red rash. Severe Allergic Reaction  Swelling of the eyes, lips, face, or tongue.  Swelling of the back of the mouth and the throat.  Wheezing.  A hoarse voice.  Itchy, red, swollen areas of skin (hives).  Dizziness or light-headedness.  Fainting.  Anxiety or confusion.  Abdominal pain.  Difficulty breathing, speaking, or swallowing.  Chest tightness.  Fast or irregular heartbeats (palpitations).  Vomiting.  Diarrhea. How is this diagnosed? This condition is diagnosed from a  physical exam and your history of recent exposure to one or more medicines. You may be referred for follow-up testing by a health care provider who specializes in allergies. This testing can confirm the diagnosis of a drug allergy and determine which medicines you are allergic to. Testing may include:  Skin tests. These may involve: ? Injecting a small amount of the possible allergen between layers of your skin (intradermal injection). ? Applying patches to your skin.  Blood tests.  Drug challenge. For this test, a health care provider gives you a small amount of a medicine in gradual doses while watching for an allergic reaction.  If you are unsure of what caused your allergic reaction, your health care provider may ask you for:  Information about all medicines that you take on a regular basis, including: ? The name of each medicine. ? How much (dosage) and how many times you take each medicine per day. ? The form of each medicine, such as pill, liquid, or injection.  The date and time of your reaction.  How is this treated? There is no cure for allergies. However, an allergic reaction can be treated with:  Medicines that help: ? To reduce pain and swelling (NSAIDs). ? To relieve itching and hives (antihistamines). ? To reduce swelling (corticosteroids).  Respiratory inhalers. These are inhaled medicines that help to widen (dilate) the airways in your lungs.  Injections of medicine that helps to relax the muscles in your airways and tighten your blood vessels (epinephrine).  Severe allergic reactions, such as anaphylaxis, require immediate treatment in a hospital. If you have an anaphylactic reaction, you may need to be hospitalized for observation. You may be prescribed rescue medicines, such as  epinephrine. Epinephrine comes in many forms, including what is commonly called an auto-injector "pen" (pre-filled automatic epinephrine injection device). Follow these instructions at  home: If You Have a Severe Allergy  Always keep an auto-injector pen or your anaphylaxis kit near you. These can be lifesaving if you have a severe reaction. Use your auto-injector pen or anaphylaxis kit as told by your health care provider.  Make sure that you, the members of your household, and your employer know: ? How to use an anaphylaxis kit. ? How to use an auto-injector pen to give you an epinephrine injection.  Replace your epinephrine immediately after you use your auto-injector pen, in case you have another reaction.  Wear a medical alert bracelet or necklace that states your drug allergy, if told by your health care provider. General instructions  Avoidmedicines that you are allergic to.  Take over-the-counter and prescription medicines only as told by your health care provider.  If you were given medicines to treat your reaction, do not drive until your health care provider approves.  If you have hives or a rash: ? Use an over-the-counter antihistamineas told by your health care provider. ? Apply cold, wet cloths (cold compresses) to your skin or take baths or showers in cool water. Avoid hot water.  If you had tests done, it is your responsibility to get your test results. Ask your health care provider when your results will be ready.  Tell any health care providers who care for you that you have a drug allergy.  Keep all follow-up visits as told by your health care provider. This is important. Contact a health care provider if:  You think that you are having an allergic reaction. Symptoms of an allergic reaction usually start within 30 minutes after you are exposed to a medicine.  You have symptoms that last more than 2 days after your reaction.  Your symptoms get worse.  You develop new symptoms. Get help right away if:  You needed to use epinephrine. ? An epinephrine injection helps to manage life-threatening allergic reactions, but you still need to go to  the emergency room even if epinephrine seems to work. This is important because anaphylaxis may happen again within 72 hours (rebound anaphylaxis). ? If you used epinephrine to treat anaphylaxis outside of the hospital, you need additional medical care. This may include more doses of epinephrine.  You develop symptoms of a severe allergic reaction. These symptoms may represent a serious problem that is an emergency. Do not wait to see if the symptoms will go away. Use your auto-injector pen or anaphylaxis kit as you have been instructed, and get medical help right away. Call your local emergency services (911 in the [place] Do not drive yourself to the hospital. This information is not intended to replace advice given to you by your health care provider. Make sure you discuss any questions you have with your health care provider. Document Released: 01/17/2005 Document Revised: 05/29/2015 Document Reviewed: 08/19/2014 Elsevier Interactive Patient Education  2018 [place] Headache Without Cause A headache is pain or discomfort felt around the head or neck area. The specific cause of a headache may not be found. There are many causes and types of headaches. A few common ones are:  Tension headaches.  Migraine headaches.  Cluster headaches.  Chronic daily headaches.  Follow these instructions at home: Watch your condition for any changes. Take these steps to help with your condition: Managing pain  Take  over-the-counter and prescription medicines only as told by your health care provider.  Lie down in a dark, quiet room when you have a headache.  If directed, apply ice to the head and neck area: ? Put ice in a plastic bag. ? Place a towel between your skin and the bag. ? Leave the ice on for 20 minutes, 2-3 times per day.  Use a heating pad or hot shower to apply heat to the head and neck area as told by your health care provider.  Keep lights dim if bright lights  bother you or make your headaches worse. Eating and drinking  Eat meals on a regular schedule.  Limit alcohol use.  Decrease the amount of caffeine you drink, or stop drinking caffeine. General instructions  Keep all follow-up visits as told by your health care provider. This is important.  Keep a headache journal to help find out what may trigger your headaches. For example, write down: ? What you eat and drink. ? How much sleep you get. ? Any change to your diet or medicines.  Try massage or other relaxation techniques.  Limit stress.  Sit up straight, and do not tense your muscles.  Do not use tobacco products, including cigarettes, chewing tobacco, or e-cigarettes. If you need help quitting, ask your health care provider.  Exercise regularly as told by your health care provider.  Sleep on a regular schedule. Get 7-9 hours of sleep, or the amount recommended by your health care provider. Contact a health care provider if:  Your symptoms are not helped by medicine.  You have a headache that is different from the usual headache.  You have nausea or you vomit.  You have a fever. Get help right away if:  Your headache becomes severe.  You have repeated vomiting.  You have a stiff neck.  You have a loss of vision.  You have problems with speech.  You have pain in the eye or ear.  You have muscular weakness or loss of muscle control.  You lose your balance or have trouble walking.  You feel faint or pass out.  You have confusion. This information is not intended to replace advice given to you by your health care provider. Make sure you discuss any questions you have with your health care provider. Document Released: 01/17/2005 Document Revised: 06/25/2015 Document Reviewed: 05/12/2014 Elsevier Interactive Patient Education  2018 [employer].     IF you received an x-ray today, you will receive an invoice from [facility]. Please contact  [facility] at [phone] with questions or concerns regarding your invoice.   IF you received labwork today, you will receive an invoice from [facility]. Please contact [facility] at [phone] with questions or concerns regarding your invoice.   Our billing staff will not be able to assist you with questions regarding bills from these companies.  You will be contacted with the lab results as soon as they are available. The fastest way to get your results is to activate your My Chart account. Instructions are located on the last page of this paperwork. If you have not heard from [place] regarding the results in 2 weeks, please contact this office.

## 2017-06-20 NOTE — Telephone Encounter (Signed)
Spoke with patient. Patient is taking Loestrin 1.5-30. States she has not had a cycle since March with taking the pills. Denies missing any pills or taking any pills late. Advised if there is any chance for pregnancy should take UPT. If not or if negative UPT okay to continue taking pills as directed. Advised it is okay to not have a menses while taking OCP. This is not uncommon. Patient verbalizes understanding. States she was recently treated for a UTI by Urgent Care and took OTC AZO for discomfort. Reports AZO made her feel dizzy and caused a headache. Unable to get rid of headache for multiple days with taking OTC medication. Advised patient should seek care with PCP today for evaluation of ongoing headache. Denies any vision changes, nausea, vomiting, or feeling dizzy today. Will call PCP to schedule an appointment. Advised to seek evaluation with ER if symptoms worsen or develops new symptoms.  Routing to provider for final review. Patient agreeable to disposition. Will close encounter.

## 2017-06-21 ENCOUNTER — Encounter: Payer: Self-pay | Admitting: Family Medicine

## 2017-06-23 ENCOUNTER — Encounter: Payer: Self-pay | Admitting: Obstetrics and Gynecology

## 2017-06-23 ENCOUNTER — Telehealth: Payer: Self-pay | Admitting: Obstetrics and Gynecology

## 2017-06-23 MED ORDER — FLUCONAZOLE 150 MG PO TABS: ORAL_TABLET | ORAL | 0 refills | Status: DC

## 2017-06-23 NOTE — Telephone Encounter (Signed)
-----   Message from [place], Generic sent at 06/23/2017 2:46 PM EDT -----    I was wondering if I could be prescribed with some yeast infection medicine. I have one after taking an antibiotic for a uti.

## 2017-06-23 NOTE — Telephone Encounter (Signed)
Left message to call Greyson Peavy at [phone].  

## 2017-06-23 NOTE — Telephone Encounter (Signed)
Patient returning call to Jill. °

## 2017-06-23 NOTE — Telephone Encounter (Signed)
Ok for Diflucan 150 mg po x 1.  May repeat in 72 hours if needed.  Needs office visit if symptoms do not resolve.

## 2017-06-23 NOTE — Telephone Encounter (Signed)
Spoke with patient. States she was treated for UTI on 5/16 by Urgent Care, followed by PCP  d/t reaction to bactrim. Reports vaginal itching, clumpy white/grey vaginal d/c with "yeast smell", started on 5/16.  Has tried OTC Azo for yeast with no relief. Has tried OTC monistat in the past, causes burning. Patient requesting RX for yeast.   Advised will review with Dr. Quincy Simmonds and return call, patient agreeable.   Dr. Quincy Simmonds -please advise on Rx?

## 2017-06-23 NOTE — Telephone Encounter (Signed)
Left detailed message, ok per dpr. Advised as seen below per Dr. Quincy Simmonds. RX sent to [facility], f/u for filling. Return call to office if any additional questions. Encounter closed.

## 2017-06-27 ENCOUNTER — Telehealth: Payer: Self-pay | Admitting: Family Medicine

## 2017-06-27 NOTE — Telephone Encounter (Signed)
I left a voicemail for Kelly Hunter in regards to cancelling/rescheduling her appt with Dr. Tamala Julian on 09/18/2017.

## 2017-08-16 ENCOUNTER — Ambulatory Visit: Payer: 59 | Admitting: Infectious Disease

## 2017-08-20 ENCOUNTER — Encounter: Payer: Self-pay | Admitting: Family Medicine

## 2017-08-22 ENCOUNTER — Encounter: Payer: Self-pay | Admitting: Family Medicine

## 2017-08-30 MED ORDER — TRIAMCINOLONE 0.1 % CREAM:EUCERIN CREAM 1:1: 1.0000 "application " | TOPICAL_CREAM | Freq: Every day | CUTANEOUS | 0 refills | Status: DC

## 2017-09-18 ENCOUNTER — Ambulatory Visit: Payer: 59 | Admitting: Family Medicine

## 2017-09-20 ENCOUNTER — Ambulatory Visit: Payer: 59 | Admitting: Family Medicine

## 2017-10-05 ENCOUNTER — Other Ambulatory Visit: Payer: 59

## 2017-10-09 ENCOUNTER — Other Ambulatory Visit: Payer: Self-pay

## 2017-10-13 ENCOUNTER — Other Ambulatory Visit: Payer: Self-pay | Admitting: Infectious Disease

## 2017-10-13 DIAGNOSIS — B20 Human immunodeficiency virus [HIV] disease: Secondary | ICD-10-CM

## 2017-10-20 ENCOUNTER — Encounter: Payer: 59 | Admitting: Infectious Disease

## 2017-11-10 ENCOUNTER — Other Ambulatory Visit: Payer: Self-pay | Admitting: Infectious Disease

## 2017-11-10 DIAGNOSIS — B20 Human immunodeficiency virus [HIV] disease: Secondary | ICD-10-CM

## 2017-11-15 ENCOUNTER — Telehealth: Payer: Self-pay

## 2017-11-15 ENCOUNTER — Other Ambulatory Visit: Payer: Self-pay | Admitting: Infectious Disease

## 2017-11-15 DIAGNOSIS — B20 Human immunodeficiency virus [HIV] disease: Secondary | ICD-10-CM

## 2017-11-15 DIAGNOSIS — A6000 Herpesviral infection of urogenital system, unspecified: Secondary | ICD-10-CM

## 2017-11-15 NOTE — Telephone Encounter (Signed)
Kelly Hunter, Pharmacist from [facility] called today requesting refill for for Truimeq. Patient has not been in clinic since 08/01/16. Patient has multiple missed appointments as well. Informed Pharmacist that patient needs to schedule a lab and office visit for refills. Will call patient as well to schedule an appointment.  [place]

## 2017-11-16 ENCOUNTER — Other Ambulatory Visit (HOSPITAL_COMMUNITY)
Admission: RE | Admit: 2017-11-16 | Discharge: 2017-11-16 | Disposition: A | Discharge status: Home / Self Care | Payer: 59 | Source: Ambulatory Visit | Attending: Infectious Disease | Admitting: Infectious Disease

## 2017-11-16 ENCOUNTER — Other Ambulatory Visit: Payer: 59

## 2017-11-16 DIAGNOSIS — B20 Human immunodeficiency virus [HIV] disease: Secondary | ICD-10-CM

## 2017-11-16 NOTE — Addendum Note (Signed)
Addended byMeriel Pica F on: 11/16/2017 02:58 PM   Modules accepted: Orders

## 2017-11-17 LAB — URINE CYTOLOGY ANCILLARY ONLY
Chlamydia: NEGATIVE
Neisseria Gonorrhea: NEGATIVE

## 2017-11-17 LAB — T-HELPER CELL (CD4) - (RCID CLINIC ONLY)
CD4 % Helper T Cell: 44 % (ref 33–55)
CD4 T CELL ABS: 1540 /uL (ref 400–2700)

## 2017-11-20 ENCOUNTER — Other Ambulatory Visit: Payer: Self-pay | Admitting: Family Medicine

## 2017-11-20 MED ORDER — TRIAMCINOLONE 0.1 % CREAM:EUCERIN CREAM 1:1: 1.0000 "application " | TOPICAL_CREAM | Freq: Every day | CUTANEOUS | 0 refills | Status: DC

## 2017-11-21 LAB — CBC WITH DIFFERENTIAL/PLATELET
BASOS PCT: 0.5 %
Basophils Absolute: 51 cells/uL (ref 0–200)
Eosinophils Absolute: 133 cells/uL (ref 15–500)
Eosinophils Relative: 1.3 %
HEMATOCRIT: 36.6 % (ref 35.0–45.0)
HEMOGLOBIN: 12.7 g/dL (ref 11.7–15.5)
Lymphs Abs: 3478 cells/uL (ref 850–3900)
MCH: 29.5 pg (ref 27.0–33.0)
MCHC: 34.7 g/dL (ref 32.0–36.0)
MCV: 84.9 fL (ref 80.0–100.0)
MPV: 10.3 fL (ref 7.5–12.5)
Monocytes Relative: 5.3 %
NEUTROS ABS: 5998 {cells}/uL (ref 1500–7800)
Neutrophils Relative %: 58.8 %
Platelets: 397 10*3/uL (ref 140–400)
RBC: 4.31 10*6/uL (ref 3.80–5.10)
RDW: 13.9 % (ref 11.0–15.0)
Total Lymphocyte: 34.1 %
WBC: 10.2 10*3/uL (ref 3.8–10.8)
WBCMIX: 541 {cells}/uL (ref 200–950)

## 2017-11-21 LAB — COMPLETE METABOLIC PANEL WITH GFR
AG Ratio: 1.4 (calc) (ref 1.0–2.5)
ALBUMIN MSPROF: 4.2 g/dL (ref 3.6–5.1)
ALKALINE PHOSPHATASE (APISO): 36 U/L (ref 33–115)
ALT: 12 U/L (ref 6–29)
AST: 15 U/L (ref 10–30)
BUN: 8 mg/dL (ref 7–25)
CO2: 26 mmol/L (ref 20–32)
CREATININE: 0.67 mg/dL (ref 0.50–1.10)
Calcium: 9.3 mg/dL (ref 8.6–10.2)
Chloride: 105 mmol/L (ref 98–110)
GFR, Est African American: 142 mL/min/{1.73_m2} (ref >=60)
GFR, Est Non African American: 122 mL/min/{1.73_m2} (ref >=60)
GLOBULIN: 3.1 g/dL (ref 1.9–3.7)
Glucose, Bld: 138 mg/dL — ABNORMAL HIGH (ref 65–99)
Potassium: 4.4 mmol/L (ref 3.5–5.3)
SODIUM: 138 mmol/L (ref 135–146)
Total Bilirubin: 0.3 mg/dL (ref 0.2–1.2)
Total Protein: 7.3 g/dL (ref 6.1–8.1)

## 2017-11-21 LAB — HIV-1 RNA QUANT-NO REFLEX-BLD
HIV 1 RNA QUANT: 30 {copies}/mL — AB
HIV-1 RNA Quant, Log: 1.48 Log copies/mL — ABNORMAL HIGH

## 2017-11-21 LAB — LIPID PANEL
CHOLESTEROL: 170 mg/dL (ref <200)
HDL: 53 mg/dL (ref >50)
LDL Cholesterol (Calc): 102 mg/dL (calc) — ABNORMAL HIGH
Non-HDL Cholesterol (Calc): 117 mg/dL (calc) (ref <130)
TRIGLYCERIDES: 64 mg/dL (ref <150)
Total CHOL/HDL Ratio: 3.2 (calc) (ref <5.0)

## 2017-11-21 LAB — RPR: RPR Ser Ql: NONREACTIVE

## 2017-12-16 ENCOUNTER — Other Ambulatory Visit: Payer: Self-pay | Admitting: Infectious Disease

## 2017-12-16 DIAGNOSIS — A6000 Herpesviral infection of urogenital system, unspecified: Secondary | ICD-10-CM

## 2017-12-18 ENCOUNTER — Encounter: Payer: Self-pay | Admitting: Behavioral Health

## 2017-12-18 ENCOUNTER — Other Ambulatory Visit: Payer: Self-pay | Admitting: Pharmacist

## 2017-12-18 DIAGNOSIS — B20 Human immunodeficiency virus [HIV] disease: Secondary | ICD-10-CM

## 2017-12-18 DIAGNOSIS — A6000 Herpesviral infection of urogenital system, unspecified: Secondary | ICD-10-CM

## 2017-12-18 MED ORDER — ABACAVIR-DOLUTEGRAVIR-LAMIVUD 600-50-300 MG PO TABS: 1.0000 | ORAL_TABLET | Freq: Every day | ORAL | 1 refills | Status: DC

## 2017-12-18 MED ORDER — VALACYCLOVIR HCL 1 G PO TABS: 1000.0000 mg | ORAL_TABLET | Freq: Every day | ORAL | 0 refills | Status: DC

## 2017-12-18 MED FILL — TRIUMEQ 600-50-300 MG TABS: 600-50-300 | 30 days supply | Qty: 30 | Fill #0

## 2017-12-18 MED FILL — valACYclovir HCL 1 GM TABS: 1 | 30 days supply | Qty: 30 | Fill #0

## 2017-12-18 NOTE — Progress Notes (Signed)
Patient is having issues with co-pays and [facility] processing her co-pay card.  We are able to fill at [facility], so I transferred her Triumeq and Valtrex to [facility] and she will go and pick it up today.

## 2017-12-25 ENCOUNTER — Encounter: Payer: Self-pay | Admitting: Infectious Disease

## 2017-12-25 ENCOUNTER — Ambulatory Visit (INDEPENDENT_AMBULATORY_CARE_PROVIDER_SITE_OTHER): Payer: 59 | Admitting: Infectious Disease

## 2017-12-25 VITALS — BP 135/87 | HR 80 | Temp 98.0°F | Ht 63.0 in | Wt 249.0 lb

## 2017-12-25 DIAGNOSIS — B20 Human immunodeficiency virus [HIV] disease: Secondary | ICD-10-CM | POA: Diagnosis not present

## 2017-12-25 DIAGNOSIS — F4321 Adjustment disorder with depressed mood: Secondary | ICD-10-CM | POA: Diagnosis not present

## 2017-12-25 DIAGNOSIS — Z23 Encounter for immunization: Secondary | ICD-10-CM

## 2017-12-25 DIAGNOSIS — L732 Hidradenitis suppurativa: Secondary | ICD-10-CM

## 2017-12-25 DIAGNOSIS — N900 Mild vulvar dysplasia: Secondary | ICD-10-CM | POA: Diagnosis not present

## 2017-12-25 DIAGNOSIS — R739 Hyperglycemia, unspecified: Secondary | ICD-10-CM

## 2017-12-25 HISTORY — DX: Hyperglycemia, unspecified: R73.9

## 2017-12-25 HISTORY — DX: Adjustment disorder with depressed mood: F43.21

## 2017-12-25 NOTE — Progress Notes (Signed)
Patient received [id number] today. Patient tolerated well.

## 2017-12-25 NOTE — Progress Notes (Signed)
Chief complaint: followup for HIV, depression related   Subjective:    Patient ID: Kelly Hunter, female    DOB: 27-Jan-1991, 27 y.o.   MRN: [id number]  HPI  27 year-old African-American lady with HIV disease with perfect virological suppression on TRIUMEQ  She is depressed grieving loss of Mother in her 49's due to PE recently he recently.  After surgery recently.  And some weight in the interim as well.  She wants to meet with Rollene Fare her counselor and will consider possibility of being treated with an SSRI.    Lab Results  Component Value Date   HIV1RNAQUANT 30 (H) 11/16/2017   HIV1RNAQUANT <20 DETECTED (A) 07/19/2016   HIV1RNAQUANT <20 07/27/2015     Her CD4 is  Lab Results  Component Value Date   CD4TABS 1,540 11/16/2017   CD4TABS 2,180 07/19/2016   CD4TABS 870 07/27/2015   Past Medical History:  Diagnosis Date  . Acne   . Allergy    [place]  . Asthma    mild; onset in childhood.  No hospitalizations.  . Chlamydia 03/03/2012  . CIN I (cervical intraepithelial neoplasia I) 08/31/2013, 05/01/17   colposcopy by Toney Rakes; s/p CO2 laser treatment. repeat colposcopy by Dr. Quincy Simmonds.  . Eczema   . Genital warts 08/31/2013   Aldara treatment; Toney Rakes.  Marland Kitchen Herpes   . Hidradenitis   . Hidradenitis axillaris 05/21/2014  . HIV (human immunodeficiency virus infection) ([place])   . HSV-2 seropositive   . Migraine headache without aura   . Substance abuse ([place])   . Type 2 diabetes mellitus without complication, without long-term current use of insulin ([place]) 11/17/2014  . VIN I (vulvar intraepithelial neoplasia I) 08/31/2013   s/p CO2 treatment; [facility]/gyn.    Past Surgical History:  Procedure Laterality Date  . BREAST SURGERY  02/01/2008   Reduction  . CO2 LASER APPLICATION N/A 09/26/784   Procedure: C02 laser of vagina and cervix;  Surgeon: Terrance Mass, MD;  Location: [facility];  Service: Gynecology;  Laterality: N/A;  . INCISION AND DRAINAGE ABSCESS Left 03/09/2017   Procedure: INCISION AND DRAINAGE ABSCESS LEFT BREAST ABSCESS;  Surgeon: Erroll Luna, MD;  Location: [facility];  Service: General;  Laterality: Left;    Family History  Problem Relation Age of Onset  . Diabetes Mother   . Hypertension Mother   . Hyperlipidemia Mother   . Mental illness Mother   . Cancer Maternal Grandfather        ?  Marland Kitchen Cancer Paternal Aunt        Cervical or Ovarian per patient      Social History   Socioeconomic History  . Marital status: Single    Spouse name: n/a  . Number of children: 0  . Years of education: Not on file  . Highest education level: Not on file  Occupational History  . Occupation: Freight forwarder    Comment: [employer]  Social Needs  . Financial resource strain: Not on file  . Food insecurity:    Worry: Not on file    Inability: Not on file  . Transportation needs:    Medical: Not on file    Non-medical: Not on file  Tobacco Use  . Smoking status: Never Smoker  . Smokeless tobacco: Never Used  Substance and Sexual Activity  . Alcohol use: Yes    Alcohol/week: 0.0 standard drinks    Comment: socially  . Drug use: No    Types: Marijuana  . Sexual activity: Yes  Partners: Male    Birth control/protection: Pill, Condom  Lifestyle  . Physical activity:    Days per week: Not on file    Minutes per session: Not on file  . Stress: Not on file  Relationships  . Social connections:    Talks on phone: Not on file    Gets together: Not on file    Attends religious service: Not on file    Active member of club or organization: Not on file    Attends meetings of clubs or organizations: Not on file    Relationship status: Not on file  Other Topics Concern  . Not on file  Social History Narrative   Marital status: single;  Not dating in 2016.      Children: none; never pregnant.      Lives: with mom; dad in [place].     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      Education: previously in school; took  medical leave in 02/2012; [place] [employer] in [place].      Tobacco: vapor cigarette two months. Pt does not smoke.       Alcohol:  Socially; weekends; no DUIs.      Drugs: marijuana daily.  Started at age 73.        Sexual activity: sexually active; 25 total partners; males; Chlamydia in 03/2012.  Genital warts 08/2013.  HIV diagnosis 12/2013.      Seatbelt: 100%      Guns:  none    Allergies  Allergen Reactions  . Other Nausea And Vomiting and Swelling    SWELLING REACTION UNSPECIFIED  Tree Nuts (Almonds)  . Peanut Oil Nausea And Vomiting and Swelling    SWELLING REACTION UNSPECIFIED   . Peanut-Containing Drug Products Nausea And Vomiting and Swelling    SWELLING REACTION UNSPECIFIED   . Pyridium [Phenazopyridine Hcl] Nausea And Vomiting  . Bactrim [Sulfamethoxazole-Trimethoprim] Rash     Current Outpatient Medications:  .  abacavir-dolutegravir-lamiVUDine (TRIUMEQ) 600-50-300 MG tablet, Take 1 tablet by mouth daily., Disp: 30 tablet, Rfl: 1 .  albuterol (PROVENTIL HFA;VENTOLIN HFA) 108 (90 Base) MCG/ACT inhaler, Inhale 2 puffs into the lungs every 6 (six) hours as needed., Disp: 1 Inhaler, Rfl: 1 .  hydrOXYzine (ATARAX/VISTARIL) 25 MG tablet, Take 1 tablet (25 mg total) by mouth every 8 (eight) hours as needed for itching., Disp: 30 tablet, Rfl: 0 .  ibuprofen (ADVIL,MOTRIN) 200 MG tablet, Take 200 mg by mouth every 6 (six) hours as needed., Disp: , Rfl:  .  imiquimod (ALDARA) 5 % cream, Apply topically 3 (three) times a week., Disp: 12 each, Rfl: 1 .  Norethindrone Acetate-Ethinyl Estradiol (JUNEL,LOESTRIN,MICROGESTIN) 1.5-30 MG-MCG tablet, Take 1 tablet by mouth daily., Disp: 3 Package, Rfl: 4 .  Triamcinolone Acetonide (TRIAMCINOLONE 0.1 % CREAM : EUCERIN) CREA, Apply 1 application topically daily. As needed., Disp: 1 each, Rfl: 0 .  valACYclovir (VALTREX) 1000 MG tablet, Take 1 tablet (1,000 mg total) by mouth daily., Disp: 30 tablet, Rfl: 0 .  fluconazole (DIFLUCAN) 150 MG tablet, Take  one tablet po now, may repeat in 72 hrs if symptoms still present. (Patient not taking: Reported on 12/25/2017), Disp: 2 tablet, Rfl: 0 .  predniSONE (DELTASONE) 20 MG tablet, Take 2 tablets daily with breakfast. (Patient not taking: Reported on 12/25/2017), Disp: 10 tablet, Rfl: 0   Review of Systems  Constitutional: Negative for activity change, appetite change, chills, diaphoresis, fatigue, fever and unexpected weight change.  HENT: Negative for congestion, rhinorrhea, sinus pressure, sneezing, sore throat and trouble swallowing.   Eyes: Negative for photophobia and visual disturbance.  Respiratory: Negative for cough, shortness of breath, wheezing and stridor.   Cardiovascular: Negative for chest pain, palpitations and leg swelling.  Gastrointestinal: Negative for abdominal distention, abdominal pain, anal bleeding, blood in stool, constipation, diarrhea, nausea and vomiting.  Genitourinary: Negative for difficulty urinating, dysuria, flank pain and hematuria.  Musculoskeletal: Negative for arthralgias, back pain, gait problem, joint swelling and myalgias.  Skin: Negative for color change, pallor, rash and wound.  Neurological: Negative for dizziness, tremors, weakness and light-headedness.  Hematological: Negative for adenopathy. Does not bruise/bleed easily.  Psychiatric/Behavioral: Positive for decreased concentration and dysphoric mood. Negative for agitation, behavioral problems, confusion and sleep disturbance.       Objective:   Physical Exam  Constitutional: She is oriented to person, place, and time. She appears well-developed and well-nourished. No distress.  HENT:  Head: Normocephalic and atraumatic.  Eyes: EOM are normal. No scleral icterus.  Neck: Normal range of motion. Neck supple.  Cardiovascular: Normal rate and regular rhythm.  Pulmonary/Chest: Effort normal. No respiratory distress. She has no wheezes.  Abdominal: She exhibits no distension.  Musculoskeletal: She  exhibits no edema or tenderness.  Neurological: She is alert and oriented to person, place, and time. She exhibits normal muscle tone. Coordination normal.  Skin: Skin is warm and dry. No rash noted. She is not diaphoretic. No erythema. No pallor.  Psychiatric: She has a normal mood and affect. Her speech is normal and behavior is normal. Judgment and thought content normal. Cognition and memory are impaired.          Assessment & Plan:  HIV: continue [place] and recheck in 6 months   Grieving: Refer to [facility] for counseling. Offer SSRI  I spent greater than 25 minutes with the patient including greater than 50% of time in face to face counsel of the patient re her grief and in coordination of her care.

## 2018-01-10 ENCOUNTER — Other Ambulatory Visit: Payer: Self-pay | Admitting: Infectious Disease

## 2018-01-10 ENCOUNTER — Other Ambulatory Visit: Payer: Self-pay

## 2018-01-10 DIAGNOSIS — B20 Human immunodeficiency virus [HIV] disease: Secondary | ICD-10-CM

## 2018-01-10 DIAGNOSIS — A6000 Herpesviral infection of urogenital system, unspecified: Secondary | ICD-10-CM

## 2018-01-10 MED ORDER — VALACYCLOVIR HCL 1 G PO TABS: 1000.0000 mg | ORAL_TABLET | Freq: Every day | ORAL | 5 refills | Status: DC

## 2018-01-10 MED ORDER — ABACAVIR-DOLUTEGRAVIR-LAMIVUD 600-50-300 MG PO TABS: 1.0000 | ORAL_TABLET | Freq: Every day | ORAL | 5 refills | Status: DC

## 2018-01-15 MED FILL — valACYclovir HCL 1 GM TABS: 1 | 30 days supply | Qty: 30 | Fill #0

## 2018-01-15 MED FILL — TRIUMEQ 600-50-300 MG TABS: 600-50-300 | 30 days supply | Qty: 30 | Fill #1

## 2018-02-13 MED FILL — valACYclovir HCL 1 GM TABS: 1 | 30 days supply | Qty: 30 | Fill #1

## 2018-02-13 MED FILL — TRIUMEQ 600-50-300 MG TABS: 600-50-300 | 30 days supply | Qty: 30 | Fill #0

## 2018-03-21 MED FILL — TRIUMEQ 600-50-300 MG TABS: 600-50-300 | 30 days supply | Qty: 30 | Fill #1

## 2018-04-03 ENCOUNTER — Encounter: Payer: Self-pay | Admitting: Family

## 2018-04-03 ENCOUNTER — Ambulatory Visit (INDEPENDENT_AMBULATORY_CARE_PROVIDER_SITE_OTHER): Payer: 59 | Admitting: Family

## 2018-04-03 ENCOUNTER — Other Ambulatory Visit (INDEPENDENT_AMBULATORY_CARE_PROVIDER_SITE_OTHER): Payer: 59

## 2018-04-03 VITALS — BP 132/78 | HR 91 | Temp 98.2°F | Ht 60.0 in | Wt 255.0 lb

## 2018-04-03 DIAGNOSIS — Z6841 Body Mass Index (BMI) 40.0 and over, adult: Secondary | ICD-10-CM

## 2018-04-03 DIAGNOSIS — R0683 Snoring: Secondary | ICD-10-CM

## 2018-04-03 DIAGNOSIS — R7303 Prediabetes: Secondary | ICD-10-CM

## 2018-04-03 DIAGNOSIS — L309 Dermatitis, unspecified: Secondary | ICD-10-CM | POA: Diagnosis not present

## 2018-04-03 DIAGNOSIS — B20 Human immunodeficiency virus [HIV] disease: Secondary | ICD-10-CM

## 2018-04-03 DIAGNOSIS — F4321 Adjustment disorder with depressed mood: Secondary | ICD-10-CM

## 2018-04-03 LAB — CBC WITH DIFFERENTIAL/PLATELET
Basophils Absolute: 0 10*3/uL (ref 0.0–0.1)
Basophils Relative: 0.4 % (ref 0.0–3.0)
Eosinophils Absolute: 0.1 10*3/uL (ref 0.0–0.7)
Eosinophils Relative: 1.4 % (ref 0.0–5.0)
HCT: 38.6 % (ref 36.0–46.0)
Hemoglobin: 13.4 g/dL (ref 12.0–15.0)
Lymphocytes Relative: 32.9 % (ref 12.0–46.0)
Lymphs Abs: 2.7 10*3/uL (ref 0.7–4.0)
MCHC: 34.7 g/dL (ref 30.0–36.0)
MCV: 85.2 fl (ref 78.0–100.0)
Monocytes Absolute: 0.5 10*3/uL (ref 0.1–1.0)
Monocytes Relative: 5.6 % (ref 3.0–12.0)
Neutro Abs: 5 10*3/uL (ref 1.4–7.7)
Neutrophils Relative %: 59.7 % (ref 43.0–77.0)
Platelets: 373 10*3/uL (ref 150.0–400.0)
RBC: 4.52 Mil/uL (ref 3.87–5.11)
RDW: 14.7 % (ref 11.5–15.5)
WBC: 8.3 10*3/uL (ref 4.0–10.5)

## 2018-04-03 LAB — COMPREHENSIVE METABOLIC PANEL
ALT: 15 U/L (ref 0–35)
AST: 14 U/L (ref 0–37)
Albumin: 4.3 g/dL (ref 3.5–5.2)
Alkaline Phosphatase: 49 U/L (ref 39–117)
BUN: 8 mg/dL (ref 6–23)
CO2: 26 mEq/L (ref 19–32)
Calcium: 9 mg/dL (ref 8.4–10.5)
Chloride: 106 mEq/L (ref 96–112)
Creatinine, Ser: 0.63 mg/dL (ref 0.40–1.20)
GFR: 138.04 mL/min (ref >60.00)
GLUCOSE: 126 mg/dL — AB (ref 70–99)
Potassium: 3.7 mEq/L (ref 3.5–5.1)
Sodium: 139 mEq/L (ref 135–145)
Total Bilirubin: 0.3 mg/dL (ref 0.2–1.2)
Total Protein: 7.4 g/dL (ref 6.0–8.3)

## 2018-04-03 LAB — HEMOGLOBIN A1C: HEMOGLOBIN A1C: 6.3 % (ref 4.6–6.5)

## 2018-04-03 MED ORDER — ESCITALOPRAM OXALATE 10 MG PO TABS: 10.0000 mg | ORAL_TABLET | Freq: Every day | ORAL | 1 refills | Status: DC

## 2018-04-03 MED ORDER — TRIAMCINOLONE 0.1 % CREAM:EUCERIN CREAM 1:1: 1.0000 "application " | TOPICAL_CREAM | Freq: Every day | CUTANEOUS | 0 refills | Status: DC

## 2018-04-03 NOTE — Progress Notes (Signed)
Kelly Hunter is a 28 y.o. female with the following history as recorded in EpicCare:  Patient Active Problem List   Diagnosis Date Noted  . Grieving 12/25/2017  . Elevated blood sugar 12/25/2017  . Sepsis ([place]) 03/08/2017  . Breast abscess 03/08/2017  . Genital herpes 04/27/2016  . Type 2 diabetes mellitus without complication, without long-term current use of insulin ([place]) 11/17/2014  . Hidradenitis axillaris 05/21/2014  . HIV disease ([place]) 01/15/2014  . Asymptomatic HIV infection ([place]) 12/30/2013  . Eczema 10/22/2013  . Rhinitis, allergic 10/22/2013  . Condyloma acuminatum of vulva 06/18/2013  . VIN I (vulvar intraepithelial neoplasia I) 06/18/2013  . Dysplasia of cervix, low grade (CIN 1) 06/18/2013  . ASCUS with positive high risk HPV 06/04/2013  . Obesity 03/30/2006  . ASTHMA, EXERCISE INDUCED 03/30/2006  . ACNE 03/30/2006    Current Outpatient Medications  Medication Sig Dispense Refill  . abacavir-dolutegravir-lamiVUDine (TRIUMEQ) 600-50-300 MG tablet Take 1 tablet by mouth daily. 30 tablet 5  . albuterol (PROVENTIL HFA;VENTOLIN HFA) 108 (90 Base) MCG/ACT inhaler Inhale 2 puffs into the lungs every 6 (six) hours as needed. 1 Inhaler 1  . Triamcinolone Acetonide (TRIAMCINOLONE 0.1 % CREAM : EUCERIN) CREA Apply 1 application topically daily. As needed. 1 each 0  . escitalopram (LEXAPRO) 10 MG tablet Take 1 tablet (10 mg total) by mouth daily. 30 tablet 1  . ibuprofen (ADVIL,MOTRIN) 200 MG tablet Take 200 mg by mouth every 6 (six) hours as needed.     No current facility-administered medications for this visit.     Allergies: Other; Peanut oil; Peanut-containing drug products; Pyridium [phenazopyridine hcl]; and Bactrim [sulfamethoxazole-trimethoprim]  Past Medical History:  Diagnosis Date  . Acne   . Allergy    Allegra  . Asthma    mild; onset in childhood.  No hospitalizations.  . ASTHMA, EXERCISE INDUCED   . Chlamydia 03/03/2012  . CIN I (cervical  intraepithelial neoplasia I) 08/31/2013, 05/01/17   colposcopy by Toney Rakes; s/p CO2 laser treatment. repeat colposcopy by Dr. Quincy Simmonds.  . Eczema   . Elevated blood sugar 12/25/2017  . Genital warts 08/31/2013   Aldara treatment; Toney Rakes.  . Grieving 12/25/2017  . Herpes   . Hidradenitis   . Hidradenitis axillaris 05/21/2014  . HIV (human immunodeficiency virus infection) ([place])   . HSV-2 seropositive   . Migraine headache without aura   . Substance abuse ([place])   . Type 2 diabetes mellitus without complication, without long-term current use of insulin ([place]) 11/17/2014  . VIN I (vulvar intraepithelial neoplasia I) 08/31/2013   s/p CO2 treatment; Fernandez/gyn.    Past Surgical History:  Procedure Laterality Date  . BREAST SURGERY  02/01/2008   Reduction  . CO2 LASER APPLICATION N/A 6/72/0947   Procedure: C02 laser of vagina and cervix;  Surgeon: Terrance Mass, MD;  Location: [facility];  Service: Gynecology;  Laterality: N/A;  . INCISION AND DRAINAGE ABSCESS Left 03/09/2017   Procedure: INCISION AND DRAINAGE ABSCESS LEFT BREAST ABSCESS;  Surgeon: Erroll Luna, MD;  Location: [facility];  Service: General;  Laterality: Left;    Family History  Problem Relation Age of Onset  . Diabetes Mother   . Hypertension Mother   . Hyperlipidemia Mother   . Mental illness Mother   . Pulmonary embolism Mother   . Cancer Maternal Grandfather        ?  Marland Kitchen Cancer Paternal Aunt        Cervical or Ovarian per patient    Social History  Tobacco Use  . Smoking status: Never Smoker  . Smokeless tobacco: Never Used  Substance Use Topics  . Alcohol use: Yes    Alcohol/week: 0.0 standard drinks    Comment: socially    Subjective:  Presents today as a new patient; + HIV positive- under care of ID; scheduled to see her GYN later this month;  ? 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  Head: Normocephalic and atraumatic  Eyes: Sclera and conjunctiva clear;  Lungs: Respirations unlabored; clear to auscultation bilaterally without wheeze, rales, rhonchi  Extremities: No edema, cyanosis, clubbing  Vessels: Symmetric bilaterally  Neurologic: Alert and oriented; speech intact; face symmetrical; moves all extremities well; CNII-XII intact without focal deficit   Assessment:  1. Eczema, unspecified type   2. Pre-diabetes   3. Loud snoring   4. Grieving   5. Situational depression   6. HIV disease (Home)   7. Class 3 severe obesity with serious comorbidity and body mass index (BMI) of 45.0 to 49.9 in adult, unspecified obesity type ([place])     Plan:  1. Refill given as requested on compounded Triamcinolone/ Eucrisa; refer to dermatology per patient request; 2. Update CMP, Hgba1c today; ? Need to start medication; encouraged exercise, weight loss; may need to change medication treating HIV; also discussed referral to [facility] Healthy Weight program- she will consider; follow-up in 4-6 weeks; 3. Refer for sleep study; 4. & 5. Continue working with therapist; add Lexapro 10 mg daily; risks and benefits discussed; follow-up in 4-6 weeks.  6. Keep planned follow-up with ID. 7. Encouraged exercise, weight loss; may need to change medication treating HIV; also discussed referral  to [facility] Weight program- she will consider; follow-up in 4-6 weeks;   Return in about 5 weeks (around 05/08/2018).  Orders Placed This Encounter  Procedures  . CBC w/Diff    Standing Status:   Future    Number of Occurrences:   1    Standing Expiration Date:   04/03/2019  . Comprehensive metabolic panel    Standing Status:   Future    Number of Occurrences:   1    Standing Expiration Date:   04/03/2019  . HgB A1c    Standing Status:   Future    Number of Occurrences:   1    Standing Expiration Date:   04/03/2019  . Ambulatory referral to Dermatology    Referral Priority:   Routine    Referral Type:   Consultation    Referral Reason:   Specialty Services Required    Requested Specialty:   Dermatology    Number of Visits Requested:   1  . Ambulatory referral to Neurology    Referral  Priority:   Routine    Referral Type:   Consultation    Referral Reason:   Specialty Services Required    Requested Specialty:   Neurology    Number of Visits Requested:   1    Requested Prescriptions   Signed Prescriptions Disp Refills  . Triamcinolone Acetonide (TRIAMCINOLONE 0.1 % CREAM : EUCERIN) CREA 1 each 0    Sig: Apply 1 application topically daily. As needed.  Marland Kitchen escitalopram (LEXAPRO) 10 MG tablet 30 tablet 1    Sig: Take 1 tablet (10 mg total) by mouth daily.

## 2018-04-04 ENCOUNTER — Other Ambulatory Visit: Payer: Self-pay | Admitting: Family

## 2018-04-04 MED ORDER — METFORMIN HCL ER 500 MG PO TB24: 500.0000 mg | ORAL_TABLET | Freq: Every day | ORAL | 2 refills | Status: DC

## 2018-04-06 MED FILL — valACYclovir HCL 1 GM TABS: 1 | 30 days supply | Qty: 30 | Fill #2

## 2018-04-16 MED FILL — TRIUMEQ 600-50-300 MG TABS: 600-50-300 | 30 days supply | Qty: 30 | Fill #2

## 2018-04-20 ENCOUNTER — Ambulatory Visit: Payer: 59 | Admitting: Obstetrics and Gynecology

## 2018-05-01 ENCOUNTER — Ambulatory Visit: Payer: 59 | Admitting: Obstetrics and Gynecology

## 2018-05-05 MED FILL — valACYclovir HCL 1 GM TABS: 1 | 30 days supply | Qty: 30 | Fill #3

## 2018-05-07 ENCOUNTER — Telehealth: Payer: Self-pay

## 2018-05-07 NOTE — Telephone Encounter (Signed)
Due to current COVID 19 pandemic, our office is severely reducing in office visits for at least the next 2 weeks, in order to minimize the risk to our patients and healthcare providers.   I called pt to discuss converting her visit to a virtual visit. No answer, left a message asking her to call me back. If pt calls back, please discuss this with her and obtain consent, insurance filing, billing for copay later, physical limitations, privacy, etc. I also need an email address. I will call pt back to discuss her chart at a later time.

## 2018-05-08 ENCOUNTER — Ambulatory Visit: Payer: 59 | Admitting: Family

## 2018-05-08 NOTE — Telephone Encounter (Signed)
I called pt again to discuss converting her appt to a a virtual visit. No answer, left a message asking her to call me back.

## 2018-05-08 NOTE — Telephone Encounter (Signed)
Called patient today and LVM about 4/9 appointment. I offered a virtual visit, and also left my personal line at work as well as my hours so she can reach me directly. I requested she call us back.

## 2018-05-09 NOTE — Telephone Encounter (Signed)
Called patient today and was able to speak with her. I offered her a virtual visit for her 4/9 appointment, to which she declined and stated that she would just like to reschedule. I cancelled patient's 4/9 appointment and rescheduled her for June. I requested patient call us back if she changes her mind.

## 2018-05-10 ENCOUNTER — Institutional Professional Consult (permissible substitution): Payer: 59 | Admitting: Neurology

## 2018-05-10 MED FILL — TRIUMEQ 600-50-300 MG TABS: 600-50-300 | 30 days supply | Qty: 30 | Fill #3

## 2018-06-05 MED FILL — TRIUMEQ 600-50-300 MG TABS: 600-50-300 | 30 days supply | Qty: 30 | Fill #4

## 2018-06-05 MED FILL — valACYclovir HCL 1 GM TABS: 1 | 30 days supply | Qty: 30 | Fill #4

## 2018-06-12 ENCOUNTER — Encounter: Payer: Self-pay | Admitting: Family

## 2018-06-12 ENCOUNTER — Telehealth: Payer: Self-pay

## 2018-06-12 NOTE — Telephone Encounter (Signed)
Patient has already been informed by [name] on what to do regarding her referral.

## 2018-06-26 ENCOUNTER — Other Ambulatory Visit: Payer: Self-pay | Admitting: Family

## 2018-06-27 ENCOUNTER — Other Ambulatory Visit: Payer: 59

## 2018-06-27 ENCOUNTER — Telehealth: Payer: Self-pay | Admitting: Family

## 2018-06-27 NOTE — Telephone Encounter (Signed)
Please see if she will schedule a follow-up for the end of June/ early July in office.

## 2018-06-27 NOTE — Telephone Encounter (Signed)
Spoke with patient and appointment scheduled for 08/06/18 @ 1:20 pm per her request.

## 2018-07-09 MED FILL — TRIUMEQ 600-50-300 MG TABS: 600-50-300 | 30 days supply | Qty: 30 | Fill #5

## 2018-07-09 MED FILL — valACYclovir HCL 1 GM TABS: 1 | 30 days supply | Qty: 30 | Fill #5

## 2018-07-11 ENCOUNTER — Encounter: Payer: 59 | Admitting: Infectious Disease

## 2018-07-16 ENCOUNTER — Institutional Professional Consult (permissible substitution): Payer: 59 | Admitting: Neurology

## 2018-08-02 ENCOUNTER — Other Ambulatory Visit: Payer: Self-pay | Admitting: Infectious Disease

## 2018-08-02 DIAGNOSIS — B20 Human immunodeficiency virus [HIV] disease: Secondary | ICD-10-CM

## 2018-08-06 ENCOUNTER — Other Ambulatory Visit: Payer: 59

## 2018-08-06 ENCOUNTER — Ambulatory Visit: Payer: 59 | Admitting: Family

## 2018-08-08 ENCOUNTER — Other Ambulatory Visit (HOSPITAL_COMMUNITY)
Admission: RE | Admit: 2018-08-08 | Discharge: 2018-08-08 | Disposition: A | Discharge status: Home / Self Care | Payer: 59 | Source: Ambulatory Visit | Attending: Infectious Disease | Admitting: Infectious Disease

## 2018-08-08 ENCOUNTER — Other Ambulatory Visit: Payer: Self-pay

## 2018-08-08 ENCOUNTER — Ambulatory Visit (INDEPENDENT_AMBULATORY_CARE_PROVIDER_SITE_OTHER): Payer: 59 | Admitting: Family

## 2018-08-08 ENCOUNTER — Other Ambulatory Visit (INDEPENDENT_AMBULATORY_CARE_PROVIDER_SITE_OTHER): Payer: 59

## 2018-08-08 ENCOUNTER — Other Ambulatory Visit: Payer: 59

## 2018-08-08 ENCOUNTER — Encounter: Payer: Self-pay | Admitting: Family

## 2018-08-08 VITALS — BP 134/76 | HR 86 | Temp 97.7°F | Ht 60.0 in | Wt 255.4 lb

## 2018-08-08 DIAGNOSIS — R7303 Prediabetes: Secondary | ICD-10-CM

## 2018-08-08 DIAGNOSIS — F4321 Adjustment disorder with depressed mood: Secondary | ICD-10-CM | POA: Diagnosis not present

## 2018-08-08 DIAGNOSIS — R350 Frequency of micturition: Secondary | ICD-10-CM | POA: Diagnosis not present

## 2018-08-08 DIAGNOSIS — B20 Human immunodeficiency virus [HIV] disease: Secondary | ICD-10-CM | POA: Diagnosis present

## 2018-08-08 DIAGNOSIS — L309 Dermatitis, unspecified: Secondary | ICD-10-CM

## 2018-08-08 LAB — COMPREHENSIVE METABOLIC PANEL
ALT: 13 U/L (ref 0–35)
AST: 11 U/L (ref 0–37)
Albumin: 4.3 g/dL (ref 3.5–5.2)
Alkaline Phosphatase: 51 U/L (ref 39–117)
BUN: 10 mg/dL (ref 6–23)
CO2: 26 mEq/L (ref 19–32)
Calcium: 9 mg/dL (ref 8.4–10.5)
Chloride: 103 mEq/L (ref 96–112)
Creatinine, Ser: 0.66 mg/dL (ref 0.40–1.20)
GFR: 130.48 mL/min (ref >60.00)
Glucose, Bld: 149 mg/dL — ABNORMAL HIGH (ref 70–99)
Potassium: 4.2 mEq/L (ref 3.5–5.1)
Sodium: 136 mEq/L (ref 135–145)
Total Bilirubin: 0.4 mg/dL (ref 0.2–1.2)
Total Protein: 7.5 g/dL (ref 6.0–8.3)

## 2018-08-08 LAB — HEMOGLOBIN A1C: Hgb A1c MFr Bld: 6.2 % (ref 4.6–6.5)

## 2018-08-08 MED ORDER — ESCITALOPRAM OXALATE 10 MG PO TABS: 10.0000 mg | ORAL_TABLET | Freq: Every day | ORAL | 6 refills | Status: DC

## 2018-08-08 MED ORDER — TRIAMCINOLONE 0.1 % CREAM:EUCERIN CREAM 1:1: 1.0000 "application " | TOPICAL_CREAM | Freq: Every day | CUTANEOUS | 0 refills | Status: DC

## 2018-08-08 NOTE — Progress Notes (Signed)
Kelly Hunter is a 28 y.o. female with the following history as recorded in EpicCare:  Patient Active Problem List   Diagnosis Date Noted  . Grieving 12/25/2017  . Elevated blood sugar 12/25/2017  . Sepsis ([place]) 03/08/2017  . Breast abscess 03/08/2017  . Genital herpes 04/27/2016  . Type 2 diabetes mellitus without complication, without long-term current use of insulin ([place]) 11/17/2014  . Hidradenitis axillaris 05/21/2014  . HIV disease ([place]) 01/15/2014  . Asymptomatic HIV infection ([place]) 12/30/2013  . Eczema 10/22/2013  . Rhinitis, allergic 10/22/2013  . Condyloma acuminatum of vulva 06/18/2013  . VIN I (vulvar intraepithelial neoplasia I) 06/18/2013  . Dysplasia of cervix, low grade (CIN 1) 06/18/2013  . ASCUS with positive high risk HPV 06/04/2013  . Obesity 03/30/2006  . ASTHMA, EXERCISE INDUCED 03/30/2006  . ACNE 03/30/2006    Current Outpatient Medications  Medication Sig Dispense Refill  . albuterol (PROVENTIL HFA;VENTOLIN HFA) 108 (90 Base) MCG/ACT inhaler Inhale 2 puffs into the lungs every 6 (six) hours as needed. 1 Inhaler 1  . escitalopram (LEXAPRO) 10 MG tablet Take 1 tablet (10 mg total) by mouth daily. 30 tablet 6  . ibuprofen (ADVIL,MOTRIN) 200 MG tablet Take 200 mg by mouth every 6 (six) hours as needed.    . metFORMIN (GLUCOPHAGE XR) 500 MG 24 hr tablet Take 1 tablet (500 mg total) by mouth daily after supper. 30 tablet 2  . Triamcinolone Acetonide (TRIAMCINOLONE 0.1 % CREAM : EUCERIN) CREA Apply 1 application topically daily. As needed. 1 each 0  . TRIUMEQ 600-50-300 MG tablet TAKE 1 TABLET BY MOUTH DAILY. 30 tablet 0  . valACYclovir (VALTREX) 1000 MG tablet Take 1,000 mg by mouth daily.     No current facility-administered medications for this visit.     Allergies: Other, Peanut oil, Peanut-containing drug products, Pyridium [phenazopyridine hcl], and Bactrim [sulfamethoxazole-trimethoprim]  Past Medical History:  Diagnosis Date  . Acne   . Allergy     Allegra  . Asthma    mild; onset in childhood.  No hospitalizations.  . ASTHMA, EXERCISE INDUCED   . Chlamydia 03/03/2012  . CIN I (cervical intraepithelial neoplasia I) 08/31/2013, 05/01/17   colposcopy by Toney Rakes; s/p CO2 laser treatment. repeat colposcopy by Dr. Quincy Simmonds.  . Eczema   . Elevated blood sugar 12/25/2017  . Genital warts 08/31/2013   Aldara treatment; Toney Rakes.  . Grieving 12/25/2017  . Herpes   . Hidradenitis   . Hidradenitis axillaris 05/21/2014  . HIV (human immunodeficiency virus infection) ([place])   . HSV-2 seropositive   . Migraine headache without aura   . Substance abuse ([place])   . Type 2 diabetes mellitus without complication, without long-term current use of insulin ([place]) 11/17/2014  . VIN I (vulvar intraepithelial neoplasia I) 08/31/2013   s/p CO2 treatment; Fernandez/gyn.    Past Surgical History:  Procedure Laterality Date  . BREAST SURGERY  02/01/2008   Reduction  . CO2 LASER APPLICATION N/A 0/10/3816   Procedure: C02 laser of vagina and cervix;  Surgeon: Terrance Mass, MD;  Location: [facility];  Service: Gynecology;  Laterality: N/A;  . INCISION AND DRAINAGE ABSCESS Left 03/09/2017   Procedure: INCISION AND DRAINAGE ABSCESS LEFT BREAST ABSCESS;  Surgeon: Erroll Luna, MD;  Location: [facility];  Service: General;  Laterality: Left;    Family History  Problem Relation Age of Onset  . Diabetes Mother   . Hypertension Mother   . Hyperlipidemia Mother   . Mental illness Mother   . Pulmonary  embolism Mother   . Cancer Maternal Grandfather        ?  Marland Kitchen Cancer Paternal Aunt        Cervical or Ovarian per patient    Social History   Tobacco Use  . Smoking status: Never Smoker  . Smokeless tobacco: Never Used  Substance Use Topics  . Alcohol use: Yes    Alcohol/week: 0.0 standard drinks    Comment: socially    Subjective:  Follow-up on start of Lexapro for situational anxiety/ depression; feels has been very beneficial; continuing to work with her  counselor; Still planning to see dermatologist in follow-up for severity of eczema- has been given multiple referrals from our office; requesting refill on her topical Triamcinolone/ Eucerin today;  Admits to increased urinary frequency recently; known pre-diabetes; deferred Metformin in the past but would like to re-consider; will need to discuss Metformin with her ID provider;   Objective:  Vitals:   08/08/18 1108  BP: 134/76  Pulse: 86  Temp: 97.7 F (36.5 C)  TempSrc: Oral  SpO2: 97%  Weight: 255 lb 6.4 oz (115.8 kg)  Height: 5' (1.524 m)    General: Well developed, well nourished, in no acute distress  Skin : Warm and dry.  Head: Normocephalic and atraumatic  Lungs: Respirations unlabored; clear to auscultation bilaterally without wheeze, rales, rhonchi  CVS exam: normal rate and regular rhythm.  Neurologic: Alert and oriented; speech intact; face symmetrical; moves all extremities well; CNII-XII intact without focal deficit   Assessment:  1. Urinary frequency   2. Pre-diabetes   3. Situational depression   4. Eczema, unspecified type     Plan:  1. & 2. Check Hgba1c today; will also check urine culture to rule out infection; will most likely need to consider trial of Metformin; will discuss with her ID provider before prescribing however. 3. Improved; continue Lexapro 10 mg daily- continue with therapist as well; 4. Refill on Triamcinolone/ Eucerin cream; encouraged for her to schedule with dermatology as previously discussed;  Encouraged to call her GYN to schedule yearly CPE as well; keep planned follow-up with ID for later this month.    No follow-ups on file.  Orders Placed This Encounter  Procedures  . Urine Culture    Standing Status:   Future    Standing Expiration Date:   08/08/2019  . HgB A1c    Standing Status:   Future    Standing Expiration Date:   08/08/2019  . Comp Met (CMET)    Standing Status:   Future    Standing Expiration Date:   08/08/2019     Requested Prescriptions   Signed Prescriptions Disp Refills  . escitalopram (LEXAPRO) 10 MG tablet 30 tablet 6    Sig: Take 1 tablet (10 mg total) by mouth daily.  . Triamcinolone Acetonide (TRIAMCINOLONE 0.1 % CREAM : EUCERIN) CREA 1 each 0    Sig: Apply 1 application topically daily. As needed.

## 2018-08-09 ENCOUNTER — Institutional Professional Consult (permissible substitution): Payer: 59 | Admitting: Neurology

## 2018-08-09 LAB — MICROALBUMIN / CREATININE URINE RATIO
Creatinine, Urine: 172 mg/dL (ref 20–275)
Microalb Creat Ratio: 5 mcg/mg creat (ref <30)
Microalb, Ur: 0.9 mg/dL

## 2018-08-09 LAB — URINE CULTURE
MICRO NUMBER:: 645944
Result:: NO GROWTH
SPECIMEN QUALITY:: ADEQUATE

## 2018-08-09 LAB — T-HELPER CELL (CD4) - (RCID CLINIC ONLY)
CD4 % Helper T Cell: 51 % (ref 33–65)
CD4 T Cell Abs: 1164 /uL (ref 400–1790)

## 2018-08-10 LAB — URINE CYTOLOGY ANCILLARY ONLY
Chlamydia: NEGATIVE
Neisseria Gonorrhea: NEGATIVE

## 2018-08-13 ENCOUNTER — Other Ambulatory Visit: Payer: Self-pay | Admitting: Family

## 2018-08-13 ENCOUNTER — Telehealth: Payer: Self-pay | Admitting: Family

## 2018-08-13 MED ORDER — METFORMIN HCL 500 MG PO TABS: 500.0000 mg | ORAL_TABLET | Freq: Every day | ORAL | 0 refills | Status: DC

## 2018-08-13 NOTE — Telephone Encounter (Signed)
-----   Message from Truman Hayward, MD sent at 08/13/2018  8:32 AM EDT ----- The Metforming Dolutegravir is not that terrible an issue though she might have more diarrhea. There may be less of an issue if I switch her to [employer] [facility]) based regimen but I think it would be fine to start Metformin 500mg  bid and then see how she does on 1gram BID eventually and I can make switch to [facility] in interim ----- Message ----- From: Marrian Salvage, FNP Sent: 08/09/2018   2:21 PM EDT To: Truman Hayward, MD  Dr. Tommy Medal,  I wanted to touch base with you about our mutual patient. She is pre-diabetic and is having increased symptoms including urinary frequency. I would like to start her on Metformin but am concerned about possible interactions with her HIV medication. Please let me know your thoughts. She is scheduled to see you later this month.  Thank you for your time, Jodi Mourning, FNP

## 2018-08-14 ENCOUNTER — Encounter: Payer: Self-pay | Admitting: Neurology

## 2018-08-15 ENCOUNTER — Telehealth: Payer: Self-pay | Admitting: Pharmacy Technician

## 2018-08-15 LAB — COMPLETE METABOLIC PANEL WITH GFR
AG Ratio: 1.4 (calc) (ref 1.0–2.5)
ALT: 11 U/L (ref 6–29)
AST: 13 U/L (ref 10–30)
Albumin: 4 g/dL (ref 3.6–5.1)
Alkaline phosphatase (APISO): 49 U/L (ref 31–125)
BUN: 10 mg/dL (ref 7–25)
CO2: 27 mmol/L (ref 20–32)
Calcium: 9 mg/dL (ref 8.6–10.2)
Chloride: 104 mmol/L (ref 98–110)
Creat: 0.77 mg/dL (ref 0.50–1.10)
GFR, Est African American: 124 mL/min/{1.73_m2} (ref >=60)
GFR, Est Non African American: 107 mL/min/{1.73_m2} (ref >=60)
Globulin: 2.8 g/dL (calc) (ref 1.9–3.7)
Glucose, Bld: 206 mg/dL — ABNORMAL HIGH (ref 65–99)
Potassium: 4.3 mmol/L (ref 3.5–5.3)
Sodium: 136 mmol/L (ref 135–146)
Total Bilirubin: 0.4 mg/dL (ref 0.2–1.2)
Total Protein: 6.8 g/dL (ref 6.1–8.1)

## 2018-08-15 LAB — CBC WITH DIFFERENTIAL/PLATELET
Absolute Monocytes: 432 cells/uL (ref 200–950)
Basophils Absolute: 37 cells/uL (ref 0–200)
Basophils Relative: 0.4 %
Eosinophils Absolute: 138 cells/uL (ref 15–500)
Eosinophils Relative: 1.5 %
HCT: 35.2 % (ref 35.0–45.0)
Hemoglobin: 11.9 g/dL (ref 11.7–15.5)
Lymphs Abs: 2594 cells/uL (ref 850–3900)
MCH: 29.1 pg (ref 27.0–33.0)
MCHC: 33.8 g/dL (ref 32.0–36.0)
MCV: 86.1 fL (ref 80.0–100.0)
MPV: 9.9 fL (ref 7.5–12.5)
Monocytes Relative: 4.7 %
Neutro Abs: 5998 cells/uL (ref 1500–7800)
Neutrophils Relative %: 65.2 %
Platelets: 388 10*3/uL (ref 140–400)
RBC: 4.09 10*6/uL (ref 3.80–5.10)
RDW: 14.4 % (ref 11.0–15.0)
Total Lymphocyte: 28.2 %
WBC: 9.2 10*3/uL (ref 3.8–10.8)

## 2018-08-15 LAB — LIPID PANEL
Cholesterol: 155 mg/dL (ref <200)
HDL: 34 mg/dL — ABNORMAL LOW (ref >=50)
LDL Cholesterol (Calc): 94 mg/dL (calc)
Non-HDL Cholesterol (Calc): 121 mg/dL (calc) (ref <130)
Total CHOL/HDL Ratio: 4.6 (calc) (ref <5.0)
Triglycerides: 168 mg/dL — ABNORMAL HIGH (ref <150)

## 2018-08-15 LAB — HIV-1 RNA QUANT-NO REFLEX-BLD
HIV 1 RNA Quant: <20 copies/mL — AB
HIV-1 RNA Quant, Log: <1.3 Log copies/mL — AB

## 2018-08-15 LAB — RPR: RPR Ser Ql: NONREACTIVE

## 2018-08-15 MED FILL — TRIUMEQ 600-50-300 MG TABS: 600-50-300 | 30 days supply | Qty: 30 | Fill #0

## 2018-08-15 NOTE — Telephone Encounter (Signed)
[facility] Patient Advocate Encounter   Was successful in obtaining a Viiv copay card for Triumeq.  This copay card will make the patients copay 0.  I have spoken with the pharmacy  The billing information is as follows and has been shared with [place].  RxBin: [id number] PCN: Loyalty Member ID: [id number] Group ID: [id number]  [place] Patient [facility] Phone: [phone] Fax:  [phone]

## 2018-08-20 ENCOUNTER — Encounter: Payer: Self-pay | Admitting: Infectious Disease

## 2018-08-20 ENCOUNTER — Other Ambulatory Visit: Payer: Self-pay

## 2018-08-20 ENCOUNTER — Ambulatory Visit (INDEPENDENT_AMBULATORY_CARE_PROVIDER_SITE_OTHER): Payer: 59 | Admitting: Infectious Disease

## 2018-08-20 VITALS — BP 123/81 | HR 94 | Temp 98.1°F | Wt 261.0 lb

## 2018-08-20 DIAGNOSIS — B20 Human immunodeficiency virus [HIV] disease: Secondary | ICD-10-CM | POA: Diagnosis not present

## 2018-08-20 DIAGNOSIS — E119 Type 2 diabetes mellitus without complications: Secondary | ICD-10-CM | POA: Diagnosis not present

## 2018-08-20 DIAGNOSIS — Z6839 Body mass index (BMI) 39.0-39.9, adult: Secondary | ICD-10-CM

## 2018-08-20 MED ORDER — BIKTARVY 50-200-25 MG PO TABS: 1.0000 | ORAL_TABLET | Freq: Every day | ORAL | 11 refills | Status: DC

## 2018-08-20 NOTE — Progress Notes (Signed)
Chief complaint: followup for HIV, depression related   Subjective:    Patient ID: Kelly Hunter, female    DOB: 10-04-1990, 28 y.o.   MRN: [id number]  HPI  28 year-old African-American lady with HIV disease with perfect virological suppression on [place] who has now developed comorbid diabetes mellitus.  She has had weight gain of roughly 40 pounds while on antiretrovirals which have included the newer preferred integrates strand transfer inhibitors.  I would like to get her off of an abacavir-containing regimen and I think that it also might be helpful to switch her from dolutegravir to bictegravir given that the increase in metformin with the latter is a bit better than with the former.  With regards to the weight gain we consider moving off of an integrates strand transfer inhibitor altogether but I think a carbohydrate restricted diet would be the better way to go.       Past Medical History:  Diagnosis Date  . Acne   . Allergy    Allegra  . Asthma    mild; onset in childhood.  No hospitalizations.  . ASTHMA, EXERCISE INDUCED   . Chlamydia 03/03/2012  . CIN I (cervical intraepithelial neoplasia I) 08/31/2013, 05/01/17   colposcopy by Toney Rakes; s/p CO2 laser treatment. repeat colposcopy by Dr. Quincy Simmonds.  . Eczema   . Elevated blood sugar 12/25/2017  . Genital warts 08/31/2013   Aldara treatment; Toney Rakes.  . Grieving 12/25/2017  . Herpes   . Hidradenitis   . Hidradenitis axillaris 05/21/2014  . HIV (human immunodeficiency virus infection) ([place])   . HSV-2 seropositive   . Migraine headache without aura   . Substance abuse ([place])   . Type 2 diabetes mellitus without complication, without long-term current use of insulin ([place]) 11/17/2014  . VIN I (vulvar intraepithelial neoplasia I) 08/31/2013   s/p CO2 treatment; Fernandez/gyn.    Past Surgical History:  Procedure Laterality Date  . BREAST SURGERY  02/01/2008   Reduction  . CO2 LASER APPLICATION N/A 8/52/7782   Procedure: C02 laser of vagina and cervix;  Surgeon: Terrance Mass, MD;  Location: [facility];  Service: Gynecology;  Laterality: N/A;  . INCISION AND DRAINAGE ABSCESS Left 03/09/2017   Procedure: INCISION AND DRAINAGE ABSCESS LEFT BREAST ABSCESS;  Surgeon: Erroll Luna, MD;  Location: [facility];  Service: General;  Laterality: Left;    Family History  Problem Relation Age of Onset  . Diabetes Mother   . Hypertension Mother   . Hyperlipidemia Mother   . Mental illness Mother   . Pulmonary embolism Mother   . Cancer Maternal Grandfather        ?  Marland Kitchen Cancer Paternal Aunt        Cervical or Ovarian per patient      Social History   Socioeconomic History  . Marital status: Single    Spouse name: n/a  . Number of children: 0  . Years of education: Not on file  . Highest education level: Not on file  Occupational History  . Occupation: Freight forwarder    Comment: [employer]  Social Needs  . Financial resource strain: Not on file  . Food insecurity    Worry: Not on file    Inability: Not on file  . Transportation needs    Medical: Not on file    Non-medical: Not on file  Tobacco Use  . Smoking status: Never Smoker  . Smokeless tobacco: Never Used  Substance and Sexual Activity  . Alcohol use: Yes  Alcohol/week: 0.0 standard drinks    Comment: socially  . Drug use: No    Types: Marijuana  . Sexual activity: Yes    Partners: Male    Birth control/protection: Pill, Condom  Lifestyle  . Physical activity    Days per week: Not on file    Minutes per session: Not on file  . Stress: Not on file  Relationships  . Social Herbalist on phone: Not on file    Gets together: Not on file    Attends religious service: Not on file    Active member of club or organization: Not on file    Attends meetings of clubs or organizations: Not on file    Relationship status: Not on file  Other Topics Concern  . Not on file  Social History Narrative   Marital status: single;  Not dating in  2016.      Children: none; never pregnant.      Lives: with mom; dad in [place].     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      Education: previously in school; took medical leave in 02/2012; [employer] in [place].      Tobacco: vapor cigarette two months. Pt does not smoke.       Alcohol:  Socially; weekends; no DUIs.      Drugs: marijuana daily.  Started at age 61.        Sexual activity: sexually active; 25 total partners; males; Chlamydia in 03/2012.  Genital warts 08/2013.  HIV diagnosis 12/2013.      Seatbelt: 100%      Guns:  none    Allergies  Allergen Reactions  . Other Nausea And Vomiting and Swelling    SWELLING REACTION UNSPECIFIED  Tree Nuts (Almonds)  . Peanut Oil Nausea And Vomiting and Swelling    SWELLING REACTION UNSPECIFIED   . Peanut-Containing Drug Products Nausea And Vomiting and Swelling    SWELLING REACTION UNSPECIFIED   . Pyridium [Phenazopyridine Hcl] Nausea And Vomiting  . Bactrim [Sulfamethoxazole-Trimethoprim] Rash     Current Outpatient Medications:  .  albuterol (PROVENTIL HFA;VENTOLIN HFA) 108 (90 Base) MCG/ACT inhaler, Inhale 2 puffs into the lungs every 6 (six) hours as needed., Disp: 1 Inhaler, Rfl: 1 .  escitalopram (LEXAPRO) 10 MG tablet, Take 1 tablet (10 mg total) by mouth daily., Disp: 30 tablet, Rfl: 6 .  ibuprofen (ADVIL,MOTRIN) 200 MG tablet, Take 200 mg by mouth every 6 (six) hours as needed., Disp: , Rfl:  .  metFORMIN (GLUCOPHAGE) 500 MG tablet, Take 1 tablet (500 mg total) by mouth daily., Disp: 90 tablet, Rfl: 0 .  Triamcinolone Acetonide (TRIAMCINOLONE 0.1 % CREAM : EUCERIN) CREA, Apply 1 application topically daily. As needed., Disp: 1 each, Rfl: 0 .  TRIUMEQ 600-50-300 MG tablet, TAKE 1 TABLET BY MOUTH DAILY., Disp: 30 tablet, Rfl: 0 .  valACYclovir (VALTREX) 1000 MG tablet, Take 1,000 mg by mouth daily., Disp: , Rfl:    Review of Systems  Constitutional: Negative for activity change,  appetite change, chills, diaphoresis, fatigue, fever and unexpected weight change.  HENT: Negative for congestion, rhinorrhea, sinus pressure, sneezing, sore throat and trouble swallowing.   Eyes: Negative for photophobia and visual disturbance.  Respiratory: Negative for cough, shortness of breath, wheezing and stridor.   Cardiovascular: Negative for chest pain, palpitations and leg swelling.  Gastrointestinal: Negative for abdominal distention, abdominal pain, anal bleeding, blood in stool, constipation, diarrhea,  nausea and vomiting.  Genitourinary: Negative for difficulty urinating, dysuria, flank pain and hematuria.  Musculoskeletal: Negative for arthralgias, back pain, gait problem, joint swelling and myalgias.  Skin: Negative for color change, pallor, rash and wound.  Neurological: Negative for dizziness, tremors, weakness and light-headedness.  Hematological: Negative for adenopathy. Does not bruise/bleed easily.  Psychiatric/Behavioral: Negative for agitation, behavioral problems, confusion, decreased concentration, dysphoric mood and sleep disturbance.       Objective:   Physical Exam  Constitutional: She is oriented to person, place, and time. She appears well-developed and well-nourished. No distress.  HENT:  Head: Normocephalic and atraumatic.  Eyes: EOM are normal. No scleral icterus.  Neck: Normal range of motion. Neck supple.  Cardiovascular: Normal rate and regular rhythm.  Pulmonary/Chest: Effort normal. No respiratory distress. She has no wheezes.  Abdominal: She exhibits no distension.  Musculoskeletal:        General: No tenderness or edema.  Neurological: She is alert and oriented to person, place, and time. She exhibits normal muscle tone. Coordination normal.  Skin: Skin is warm and dry. No rash noted. She is not diaphoretic. No erythema. No pallor.  Psychiatric: She has a normal mood and affect. Her speech is normal and behavior is normal. Judgment and thought  content normal. Cognition and memory are impaired.          Assessment & Plan:  HIV:   Switch to Biktarvy from Triumeq to get her off of an abacavir-containing regimen and to also reduce drug drug interaction with metformin  Recheck labs in next 2 months   Weight gain: Certainly occurred in the context of her HIV being treated and in the context of her receiving antiretrovirals known to have higher amounts of weight gain in patients though the mechanism and reason is debated not known.  For now we will continue her on a newer integrates strand transfer inhibitor.  We can consider potentially switching to a non-nucleotide reverse transcriptase inhibitor to see if this would help.  However I think doing a carbohydrate restricted diet is the most effective thing to engage in at present.  Diabetes mellitus on metformin which is being titrated.  Hopefully she can lose weight which would ultimately help fix the problem of both obesity and her diabetes mellitus.  I spent greater than 25 minutes with the patient including greater than 50% of time in face to face counsel of the patient with regards to the data surrounding abacavir and cardiovascular risk, and the controversy around this, regarding the data around integrates strand transfer inhibitor at [facility] weight gain and people living with HIV and in coordination of her care.

## 2018-08-21 ENCOUNTER — Ambulatory Visit: Payer: 59 | Admitting: Obstetrics and Gynecology

## 2018-08-21 MED FILL — BIKTARVY 50-200-25 MG TABS: 50-200-25 | 30 days supply | Qty: 30 | Fill #0

## 2018-08-21 NOTE — Progress Notes (Deleted)
28 y.o. G1P0010 Single African American female here for annual exam.    PCP:     No LMP recorded. (Menstrual status: Oral contraceptives).           Sexually active: {yes no:314532}  The current method of family planning is {contraception:315051}.    Exercising: {yes no:314532}  {types:19826} Smoker:  {YES NO:22349}  Health Maintenance: Pap:  05-01-17 colposcopy showing LGSIL, ECC negative           04-10-17 ASCUS, HR HPV negative   History of abnormal Pap:  yes MMG:  *** Colonoscopy:  *** BMD:   ***  Result  *** TDaP:  10-19- Gardasil:   {YES NO:22349} HIV: patient has HIV currently  Hep C: 12-31-13 negative  Screening Labs:  Hb today: ***, Urine today: ***   reports that she has never smoked. She has never used smokeless tobacco. She reports current alcohol use. She reports that she does not use drugs.  Past Medical History:  Diagnosis Date  . Acne   . Allergy    Allegra  . Asthma    mild; onset in childhood.  No hospitalizations.  . ASTHMA, EXERCISE INDUCED   . Chlamydia 03/03/2012  . CIN I (cervical intraepithelial neoplasia I) 08/31/2013, 05/01/17   colposcopy by Toney Rakes; s/p CO2 laser treatment. repeat colposcopy by Dr. Quincy Simmonds.  . Eczema   . Elevated blood sugar 12/25/2017  . Genital warts 08/31/2013   Aldara treatment; Toney Rakes.  . Grieving 12/25/2017  . Herpes   . Hidradenitis   . Hidradenitis axillaris 05/21/2014  . HIV (human immunodeficiency virus infection) ([place])   . HSV-2 seropositive   . Migraine headache without aura   . Substance abuse ([place])   . Type 2 diabetes mellitus without complication, without long-term current use of insulin ([place]) 11/17/2014  . VIN I (vulvar intraepithelial neoplasia I) 08/31/2013   s/p CO2 treatment; Fernandez/gyn.    Past Surgical History:  Procedure Laterality Date  . BREAST SURGERY  02/01/2008   Reduction  . CO2 LASER APPLICATION N/A 6/75/9163   Procedure: C02 laser of vagina and cervix;  Surgeon: Terrance Mass, MD;   Location: [facility];  Service: Gynecology;  Laterality: N/A;  . INCISION AND DRAINAGE ABSCESS Left 03/09/2017   Procedure: INCISION AND DRAINAGE ABSCESS LEFT BREAST ABSCESS;  Surgeon: Erroll Luna, MD;  Location: [facility];  Service: General;  Laterality: Left;    Current Outpatient Medications  Medication Sig Dispense Refill  . albuterol (PROVENTIL HFA;VENTOLIN HFA) 108 (90 Base) MCG/ACT inhaler Inhale 2 puffs into the lungs every 6 (six) hours as needed. 1 Inhaler 1  . bictegravir-emtricitabine-tenofovir AF (BIKTARVY) 50-200-25 MG TABS tablet Take 1 tablet by mouth daily. 30 tablet 11  . escitalopram (LEXAPRO) 10 MG tablet Take 1 tablet (10 mg total) by mouth daily. 30 tablet 6  . metFORMIN (GLUCOPHAGE) 500 MG tablet Take 1 tablet (500 mg total) by mouth daily. 90 tablet 0  . Triamcinolone Acetonide (TRIAMCINOLONE 0.1 % CREAM : EUCERIN) CREA Apply 1 application topically daily. As needed. 1 each 0  . valACYclovir (VALTREX) 1000 MG tablet Take 1,000 mg by mouth daily.     No current facility-administered medications for this visit.     Family History  Problem Relation Age of Onset  . Diabetes Mother   . Hypertension Mother   . Hyperlipidemia Mother   . Mental illness Mother   . Pulmonary embolism Mother   . Cancer Maternal Grandfather        ?  Marland Kitchen  Cancer Paternal Aunt        Cervical or Ovarian per patient    Review of Systems  Exam:   There were no vitals taken for this visit.    General appearance: alert, cooperative and appears stated age Head: normocephalic, without obvious abnormality, atraumatic Neck: no adenopathy, supple, symmetrical, trachea midline and thyroid normal to inspection and palpation Lungs: clear to auscultation bilaterally Breasts: normal appearance, no masses or tenderness, No nipple retraction or dimpling, No nipple discharge or bleeding, No axillary adenopathy Heart: regular rate and rhythm Abdomen: soft, non-tender; no masses, no organomegaly Extremities:  extremities normal, atraumatic, no cyanosis or edema Skin: skin color, texture, turgor normal. No rashes or lesions Lymph nodes: cervical, supraclavicular, and axillary nodes normal. Neurologic: grossly normal  Pelvic: External genitalia:  no lesions              No abnormal inguinal nodes palpated.              Urethra:  normal appearing urethra with no masses, tenderness or lesions              Bartholins and Skenes: normal                 Vagina: normal appearing vagina with normal color and discharge, no lesions              Cervix: no lesions              Pap taken: {yes no:314532} Bimanual Exam:  Uterus:  normal size, contour, position, consistency, mobility, non-tender              Adnexa: no mass, fullness, tenderness              Rectal exam: {yes no:314532}.  Confirms.              Anus:  normal sphincter tone, no lesions  Chaperone was present for exam.  Assessment:   Well woman visit with normal exam.   Plan: Mammogram screening discussed. Self breast awareness reviewed. Pap and HR HPV as above. Guidelines for Calcium, Vitamin D, regular exercise program including cardiovascular and weight bearing exercise.   Follow up annually and prn.   Additional counseling given.  {yes [id number]. _______ minutes face to face time of which over 50% was spent in counseling.    After visit summary provided.

## 2018-08-28 ENCOUNTER — Other Ambulatory Visit: Payer: Self-pay | Admitting: Infectious Disease

## 2018-08-28 MED FILL — valACYclovir HCL 1 GM TABS: 1 | 30 days supply | Qty: 30 | Fill #0

## 2018-09-17 MED FILL — BIKTARVY 50-200-25 MG TABS: 50-200-25 | 30 days supply | Qty: 30 | Fill #1

## 2018-09-28 ENCOUNTER — Other Ambulatory Visit: Payer: Self-pay | Admitting: *Deleted

## 2018-09-28 DIAGNOSIS — B20 Human immunodeficiency virus [HIV] disease: Secondary | ICD-10-CM

## 2018-10-03 MED FILL — valACYclovir HCL 1 GM TABS: 1 | 30 days supply | Qty: 30 | Fill #1

## 2018-10-09 ENCOUNTER — Other Ambulatory Visit: Payer: 59

## 2018-10-09 ENCOUNTER — Other Ambulatory Visit: Payer: Self-pay

## 2018-10-09 DIAGNOSIS — B20 Human immunodeficiency virus [HIV] disease: Secondary | ICD-10-CM

## 2018-10-09 DIAGNOSIS — E119 Type 2 diabetes mellitus without complications: Secondary | ICD-10-CM

## 2018-10-10 LAB — T-HELPER CELL (CD4) - (RCID CLINIC ONLY)
CD4 % Helper T Cell: 47 % (ref 33–65)
CD4 T Cell Abs: 1238 /uL (ref 400–1790)

## 2018-10-11 LAB — HIV-1 RNA QUANT-NO REFLEX-BLD
HIV 1 RNA Quant: <20 copies/mL — AB
HIV-1 RNA Quant, Log: <1.3 Log copies/mL — AB

## 2018-10-15 MED FILL — BIKTARVY 50-200-25 MG TABS: 50-200-25 | 30 days supply | Qty: 30 | Fill #2

## 2018-10-17 ENCOUNTER — Encounter: Payer: Self-pay | Admitting: Obstetrics and Gynecology

## 2018-10-17 ENCOUNTER — Ambulatory Visit: Payer: Self-pay | Admitting: Obstetrics and Gynecology

## 2018-10-17 NOTE — Progress Notes (Deleted)
28 y.o. G1P0010 Single African American female here for annual exam.    PCP:     No LMP recorded. (Menstrual status: Oral contraceptives).           Sexually active: {yes no:314532}  The current method of family planning is {contraception:315051}.    Exercising: {yes no:314532}  {types:19826} Smoker:  ***yes, ?Marijuana  Health Maintenance: Pap: 04-10-17 ASCUS:Neg HR HPV History of abnormal Pap:  {YES NO:22349} MMG:  n/a Colonoscopy:  n/a BMD:   n/a  Result  n/a TDaP:  11-18-13 Gardasil:   Yes, completed HIV: Pos. Hep C:04-10-17 Neg Screening Labs:  Hb today: ***, Urine today: ***   reports that she has never smoked. She has never used smokeless tobacco. She reports current alcohol use. She reports that she does not use drugs.  Past Medical History:  Diagnosis Date  . Acne   . Allergy    Allegra  . Asthma    mild; onset in childhood.  No hospitalizations.  . ASTHMA, EXERCISE INDUCED   . Chlamydia 03/03/2012  . CIN I (cervical intraepithelial neoplasia I) 08/31/2013, 05/01/17   colposcopy by Toney Rakes; s/p CO2 laser treatment. repeat colposcopy by Dr. Quincy Simmonds.  . Eczema   . Elevated blood sugar 12/25/2017  . Genital warts 08/31/2013   Aldara treatment; Toney Rakes.  . Grieving 12/25/2017  . Herpes   . Hidradenitis   . Hidradenitis axillaris 05/21/2014  . HIV (human immunodeficiency virus infection) ([place])   . HSV-2 seropositive   . Migraine headache without aura   . Substance abuse ([place])   . Type 2 diabetes mellitus without complication, without long-term current use of insulin ([place]) 11/17/2014  . VIN I (vulvar intraepithelial neoplasia I) 08/31/2013   s/p CO2 treatment; Fernandez/gyn.    Past Surgical History:  Procedure Laterality Date  . BREAST SURGERY  02/01/2008   Reduction  . CO2 LASER APPLICATION N/A [id number]   Procedure: C02 laser of vagina and cervix;  Surgeon: Terrance Mass, MD;  Location: [facility];  Service: Gynecology;  Laterality: N/A;  . INCISION AND DRAINAGE  ABSCESS Left 03/09/2017   Procedure: INCISION AND DRAINAGE ABSCESS LEFT BREAST ABSCESS;  Surgeon: Erroll Luna, MD;  Location: [facility];  Service: General;  Laterality: Left;    Current Outpatient Medications  Medication Sig Dispense Refill  . albuterol (PROVENTIL HFA;VENTOLIN HFA) 108 (90 Base) MCG/ACT inhaler Inhale 2 puffs into the lungs every 6 (six) hours as needed. 1 Inhaler 1  . bictegravir-emtricitabine-tenofovir AF (BIKTARVY) 50-200-25 MG TABS tablet Take 1 tablet by mouth daily. 30 tablet 11  . escitalopram (LEXAPRO) 10 MG tablet Take 1 tablet (10 mg total) by mouth daily. 30 tablet 6  . metFORMIN (GLUCOPHAGE) 500 MG tablet Take 1 tablet (500 mg total) by mouth daily. 90 tablet 0  . Triamcinolone Acetonide (TRIAMCINOLONE 0.1 % CREAM : EUCERIN) CREA Apply 1 application topically daily. As needed. 1 each 0  . valACYclovir (VALTREX) 1000 MG tablet TAKE 1 TABLET (1,000 MG TOTAL) BY MOUTH DAILY. 30 tablet 2   No current facility-administered medications for this visit.     Family History  Problem Relation Age of Onset  . Diabetes Mother   . Hypertension Mother   . Hyperlipidemia Mother   . Mental illness Mother   . Pulmonary embolism Mother   . Cancer Maternal Grandfather        ?  Marland Kitchen Cancer Paternal Aunt        Cervical or Ovarian per patient    Review of  Systems  Exam:   There were no vitals taken for this visit.    General appearance: alert, cooperative and appears stated age Head: normocephalic, without obvious abnormality, atraumatic Neck: no adenopathy, supple, symmetrical, trachea midline and thyroid normal to inspection and palpation Lungs: clear to auscultation bilaterally Breasts: normal appearance, no masses or tenderness, No nipple retraction or dimpling, No nipple discharge or bleeding, No axillary adenopathy Heart: regular rate and rhythm Abdomen: soft, non-tender; no masses, no organomegaly Extremities: extremities normal, atraumatic, no cyanosis or  edema Skin: skin color, texture, turgor normal. No rashes or lesions Lymph nodes: cervical, supraclavicular, and axillary nodes normal. Neurologic: grossly normal  Pelvic: External genitalia:  no lesions              No abnormal inguinal nodes palpated.              Urethra:  normal appearing urethra with no masses, tenderness or lesions              Bartholins and Skenes: normal                 Vagina: normal appearing vagina with normal color and discharge, no lesions              Cervix: no lesions              Pap taken: {yes no:314532} Bimanual Exam:  Uterus:  normal size, contour, position, consistency, mobility, non-tender              Adnexa: no mass, fullness, tenderness              Rectal exam: {yes no:314532}.  Confirms.              Anus:  normal sphincter tone, no lesions  Chaperone was present for exam.  Assessment:   Well woman visit with normal exam.   Plan: Mammogram screening discussed. Self breast awareness reviewed. Pap and HR HPV as above. Guidelines for Calcium, Vitamin D, regular exercise program including cardiovascular and weight bearing exercise.   Follow up annually and prn.   Additional counseling given.  {yes [id number]. _______ minutes face to face time of which over 50% was spent in counseling.    After visit summary provided.

## 2018-10-24 ENCOUNTER — Ambulatory Visit (INDEPENDENT_AMBULATORY_CARE_PROVIDER_SITE_OTHER): Payer: 59 | Admitting: Infectious Disease

## 2018-10-24 ENCOUNTER — Other Ambulatory Visit: Payer: Self-pay

## 2018-10-24 ENCOUNTER — Encounter: Payer: Self-pay | Admitting: Infectious Disease

## 2018-10-24 VITALS — BP 120/81 | HR 111 | Temp 98.2°F

## 2018-10-24 DIAGNOSIS — Z23 Encounter for immunization: Secondary | ICD-10-CM

## 2018-10-24 DIAGNOSIS — F331 Major depressive disorder, recurrent, moderate: Secondary | ICD-10-CM | POA: Diagnosis not present

## 2018-10-24 DIAGNOSIS — Z113 Encounter for screening for infections with a predominantly sexual mode of transmission: Secondary | ICD-10-CM

## 2018-10-24 DIAGNOSIS — E119 Type 2 diabetes mellitus without complications: Secondary | ICD-10-CM | POA: Diagnosis not present

## 2018-10-24 DIAGNOSIS — N900 Mild vulvar dysplasia: Secondary | ICD-10-CM | POA: Diagnosis not present

## 2018-10-24 DIAGNOSIS — B20 Human immunodeficiency virus [HIV] disease: Secondary | ICD-10-CM

## 2018-10-24 DIAGNOSIS — Z79899 Other long term (current) drug therapy: Secondary | ICD-10-CM

## 2018-10-24 HISTORY — DX: Major depressive disorder, recurrent, moderate: F33.1

## 2018-10-24 MED ORDER — BIKTARVY 50-200-25 MG PO TABS: 1.0000 | ORAL_TABLET | Freq: Every day | ORAL | 11 refills | Status: DC

## 2018-10-24 MED ORDER — METFORMIN HCL 500 MG PO TABS: 500.0000 mg | ORAL_TABLET | Freq: Two times a day (BID) | ORAL | 0 refills | Status: DC

## 2018-10-24 NOTE — Addendum Note (Signed)
Addended by: Aundria Rud on: 10/24/2018 11:30 AM   Modules accepted: Orders

## 2018-10-24 NOTE — Progress Notes (Signed)
Chief complaint: depressive symptoms  Subjective:    Patient ID: Kelly Hunter, female    DOB: 06/20/1990, 28 y.o.   MRN: [id number]  HPI  28 year-old African-American lady with HIV disease with perfect virological suppression on Kelly Hunter who has now developed comorbid diabetes mellitus.  She has had weight gain of roughly 40 pounds while on antiretrovirals which have included the newer preferred integrase strand transfer inhibitors.  I wamted to get her off of an abacavir-containing regimen and I think that it also might be helpful to switch her from dolutegravir to bictegravir given that the increase in metformin with the latter is a bit better than with the former.  We changed to Kelly Hunter with perfect virological suppression.  Since changed to Kelly Hunter she has noticed a little bit of queasiness in the morning but she is not sure if this might be related to acid reflux.  She is taking metformin but only once daily.  Kelly Hunter has had worsening depressive symptoms in the last 2 weeks.  They correlate with the death of her grandmother who has been suffering from dementia and also had cancer.  They both progressed and she is mentioned as to way roughly 2 weeks ago.  She has had depressive symptoms labile mood at times despite Lexapro.  She does endorse some passive suicidal ideation but no active suicidal ideation and is contracted for safety.  We considered other antidepressants that we could use but she preferred to stay on the one she has on and continue to seek out a counselor and she is in between counselors at the present point time and does not have a Social worker.  She agrees to see Kelly Hunter today as well.   Of note she also lost her mother last September 2019 when she is suffered a DVT and pulmonary embolism postoperatively after hysterectomy.        Past Medical History:  Diagnosis Date  . Acne   . Allergy    Allegra  . Asthma    mild; onset in childhood.  No  hospitalizations.  . ASTHMA, EXERCISE INDUCED   . Chlamydia 03/03/2012  . CIN I (cervical intraepithelial neoplasia I) 08/31/2013, 05/01/17   colposcopy by Kelly Hunter; s/p CO2 laser treatment. repeat colposcopy by Dr. Quincy Hunter.  . Eczema   . Elevated blood sugar 12/25/2017  . Genital warts 08/31/2013   Aldara treatment; Kelly Hunter.  . Grieving 12/25/2017  . Herpes   . Hidradenitis   . Hidradenitis axillaris 05/21/2014  . HIV (human immunodeficiency virus infection) ([place])   . HSV-2 seropositive   . Migraine headache without aura   . Moderate episode of recurrent major depressive disorder ([place]) 10/24/2018  . Substance abuse ([place])   . Type 2 diabetes mellitus without complication, without long-term current use of insulin ([place]) 11/17/2014  . VIN I (vulvar intraepithelial neoplasia I) 08/31/2013   s/p CO2 treatment; Kelly Hunter/gyn.    Past Surgical History:  Procedure Laterality Date  . BREAST SURGERY  02/01/2008   Reduction  . CO2 LASER APPLICATION N/A 123456   Procedure: C02 laser of vagina and cervix;  Surgeon: Kelly Mass, MD;  Location: [facility];  Service: Gynecology;  Laterality: N/A;  . INCISION AND DRAINAGE ABSCESS Left 03/09/2017   Procedure: INCISION AND DRAINAGE ABSCESS LEFT BREAST ABSCESS;  Surgeon: Kelly Luna, MD;  Location: [facility];  Service: General;  Laterality: Left;    Family History  Problem Relation Age of Onset  . Diabetes Mother   . Hypertension Mother   .  Hyperlipidemia Mother   . Mental illness Mother   . Pulmonary embolism Mother   . Cancer Maternal Grandfather        ?  Kelly Hunter Kitchen Cancer Paternal Aunt        Cervical or Ovarian per patient      Social History   Socioeconomic History  . Marital status: Single    Spouse name: n/a  . Number of children: 0  . Years of education: Not on file  . Highest education level: Not on file  Occupational History  . Occupation: Freight forwarder    Comment: Kelly Hunter  Social Needs  . Financial resource strain: Not on file  . Food  insecurity    Worry: Not on file    Inability: Not on file  . Transportation needs    Medical: Not on file    Non-medical: Not on file  Tobacco Use  . Smoking status: Never Smoker  . Smokeless tobacco: Never Used  Substance and Sexual Activity  . Alcohol use: Yes    Alcohol/week: 0.0 standard drinks    Comment: socially  . Drug use: No    Types: Marijuana  . Sexual activity: Yes    Partners: Male    Birth control/protection: Pill, Condom  Lifestyle  . Physical activity    Days per week: Not on file    Minutes per session: Not on file  . Stress: Not on file  Relationships  . Social Herbalist on phone: Not on file    Gets together: Not on file    Attends religious service: Not on file    Active member of club or organization: Not on file    Attends meetings of clubs or organizations: Not on file    Relationship status: Not on file  Other Topics Concern  . Not on file  Social History Narrative   Marital status: single;  Not dating in 2016.      Children: none; never pregnant.      Lives: with mom; dad in [place].     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      Education: previously in school; took medical leave in 02/2012; [employer] in [place].      Tobacco: vapor cigarette two months. Pt does not smoke.       Alcohol:  Socially; weekends; no DUIs.      Drugs: marijuana daily.  Started at age 70.        Sexual activity: sexually active; 25 total partners; males; Chlamydia in 03/2012.  Genital warts 08/2013.  HIV diagnosis 12/2013.      Seatbelt: 100%      Guns:  none    Allergies  Allergen Reactions  . Other Nausea And Vomiting and Swelling    SWELLING REACTION UNSPECIFIED  Tree Nuts (Almonds)  . Peanut Oil Nausea And Vomiting and Swelling    SWELLING REACTION UNSPECIFIED   . Peanut-Containing Drug Products Nausea And Vomiting and Swelling    SWELLING REACTION UNSPECIFIED   . Pyridium [Phenazopyridine Hcl] Nausea  And Vomiting  . Bactrim [Sulfamethoxazole-Trimethoprim] Rash     Current Outpatient Medications:  .  albuterol (PROVENTIL HFA;VENTOLIN HFA) 108 (90 Base) MCG/ACT inhaler, Inhale 2 puffs into the lungs every 6 (six) hours as needed., Disp: 1 Inhaler, Rfl: 1 .  bictegravir-emtricitabine-tenofovir AF (BIKTARVY) 50-200-25 MG TABS tablet, Take 1 tablet by mouth daily., Disp: 30 tablet, Rfl: 11 .  escitalopram (LEXAPRO) 10 MG  tablet, Take 1 tablet (10 mg total) by mouth daily., Disp: 30 tablet, Rfl: 6 .  metFORMIN (GLUCOPHAGE) 500 MG tablet, Take 1 tablet (500 mg total) by mouth daily., Disp: 90 tablet, Rfl: 0 .  Triamcinolone Acetonide (TRIAMCINOLONE 0.1 % CREAM : EUCERIN) CREA, Apply 1 application topically daily. As needed., Disp: 1 each, Rfl: 0 .  valACYclovir (VALTREX) 1000 MG tablet, TAKE 1 TABLET (1,000 MG TOTAL) BY MOUTH DAILY., Disp: 30 tablet, Rfl: 2   Review of Systems  Constitutional: Negative for activity change, appetite change, chills, diaphoresis, fatigue, fever and unexpected weight change.  HENT: Negative for congestion, rhinorrhea, sinus pressure, sneezing, sore throat and trouble swallowing.   Eyes: Negative for photophobia and visual disturbance.  Respiratory: Negative for cough, shortness of breath, wheezing and stridor.   Cardiovascular: Negative for chest pain, palpitations and leg swelling.  Gastrointestinal: Positive for nausea. Negative for abdominal distention, abdominal pain, anal bleeding, blood in stool, constipation, diarrhea and vomiting.  Genitourinary: Negative for difficulty urinating, dysuria, flank pain and hematuria.  Musculoskeletal: Negative for arthralgias, back pain, gait problem, joint swelling and myalgias.  Skin: Negative for color change, pallor, rash and wound.  Neurological: Negative for dizziness, tremors, weakness and light-headedness.  Hematological: Negative for adenopathy. Does not bruise/bleed easily.  Psychiatric/Behavioral: Positive for  dysphoric mood and suicidal ideas. Negative for agitation, behavioral problems, confusion, decreased concentration and sleep disturbance. The patient is nervous/anxious.        Objective:   Physical Exam  Constitutional: She is oriented to person, place, and time. She appears well-developed and well-nourished. No distress.  HENT:  Head: Normocephalic and atraumatic.  Eyes: EOM are normal. No scleral icterus.  Neck: Normal range of motion. Neck supple.  Cardiovascular: Normal rate and regular rhythm.  Pulmonary/Chest: Effort normal. No respiratory distress. She has no wheezes.  Abdominal: She exhibits no distension.  Musculoskeletal:        General: No tenderness or edema.  Neurological: She is alert and oriented to person, place, and time. She exhibits normal muscle tone. Coordination normal.  Skin: Skin is warm and dry. No rash noted. She is not diaphoretic. No erythema. No pallor.  Psychiatric: Her speech is normal and behavior is normal. Judgment and thought content normal. She exhibits a depressed mood.          Assessment & Plan:   Major depression with grieving and some passive suicidal ideation: She would like to stay on the Lexapro for now she is going to meet with Kelly Hunter and also try to get another counselor.    HIV:   Continue Biktarvy and she can come back in 1 year for labs and a visit with me.  She has been highly adherent to her antiretroviral regimen.  We can consider switching off the integrase in the future but I would prefer to make other interventions first   Weight gain: As mentioned we tried to perform other interventions for now but I am certainly open to other therapies such as for example Pifeltro and DESCOV  Diabetes mellitus on metformin: I have increased her metformin to 500 mg twice daily.  She should build to titrate up to the full 1000 g twice daily and to see if she can tolerate this.  There is less of a drug drug interaction action with  bictegravir and metformin then with dolutegravir and metformin   I spent greater than 25 minutes with the patient including greater than 50% of time in face to face counsel of the  patient regarding her depressive symptoms grief and loss of her grandmother and mother with supportive therapy given also with discussion of her current regimen and possible other regimens we could consider, or diabetes mellitus and in coordination of her care.

## 2018-10-26 ENCOUNTER — Ambulatory Visit: Payer: 59 | Admitting: Obstetrics and Gynecology

## 2018-10-26 NOTE — Progress Notes (Deleted)
28 y.o. G1P0010 Single {Race/ethnicity:17218} female here for annual exam.    PCP:     No LMP recorded. (Menstrual status: Oral contraceptives).           Sexually active: {yes no:314532}  The current method of family planning is {contraception:315051}.    Exercising: {yes no:314532}  {types:19826} Smoker:  {YES [id number]  Health Maintenance: Pap: 05/01/17 LGSIL of the cervix, ECC negative; 04/10/17 ASCUS and negative HR HPV Patient has a history of LGSIL and VIN 1.  Had laser tx in 2015 with Dr. Toney Rakes. History of abnormal Pap:  yes MMG:  n/a Colonoscopy:  n/a BMD:   n/a  Result  n/a TDaP: 2015 Gardasil:   yes HIV: patient is HIV positive Hep C: 04/10/17 Negative Screening Labs:  Hb today: ***, Urine today: ***   reports that she has never smoked. She has never used smokeless tobacco. She reports current alcohol use. She reports that she does not use drugs.  Past Medical History:  Diagnosis Date  . Acne   . Allergy    Allegra  . Asthma    mild; onset in childhood.  No hospitalizations.  . ASTHMA, EXERCISE INDUCED   . Chlamydia 03/03/2012  . CIN I (cervical intraepithelial neoplasia I) 08/31/2013, 05/01/17   colposcopy by Toney Rakes; s/p CO2 laser treatment. repeat colposcopy by Dr. Quincy Simmonds.  . Eczema   . Elevated blood sugar 12/25/2017  . Genital warts 08/31/2013   Aldara treatment; Toney Rakes.  . Grieving 12/25/2017  . Herpes   . Hidradenitis   . Hidradenitis axillaris 05/21/2014  . HIV (human immunodeficiency virus infection) ([place])   . HSV-2 seropositive   . Migraine headache without aura   . Moderate episode of recurrent major depressive disorder ([place]) 10/24/2018  . Substance abuse ([place])   . Type 2 diabetes mellitus without complication, without long-term current use of insulin ([facility] [facility]) 11/17/2014  . VIN I (vulvar intraepithelial neoplasia I) 08/31/2013   s/p CO2 treatment; Fernandez/gyn.    Past Surgical History:  Procedure Laterality Date  . BREAST SURGERY  02/01/2008   Reduction  . CO2 LASER APPLICATION N/A 123456   Procedure: C02 laser of vagina and cervix;  Surgeon: Terrance Mass, MD;  Location: [facility];  Service: Gynecology;  Laterality: N/A;  . INCISION AND DRAINAGE ABSCESS Left 03/09/2017   Procedure: INCISION AND DRAINAGE ABSCESS LEFT BREAST ABSCESS;  Surgeon: Erroll Luna, MD;  Location: [facility];  Service: General;  Laterality: Left;    Current Outpatient Medications  Medication Sig Dispense Refill  . albuterol (PROVENTIL HFA;VENTOLIN HFA) 108 (90 Base) MCG/ACT inhaler Inhale 2 puffs into the lungs every 6 (six) hours as needed. 1 Inhaler 1  . bictegravir-emtricitabine-tenofovir AF (BIKTARVY) 50-200-25 MG TABS tablet Take 1 tablet by mouth daily. 30 tablet 11  . escitalopram (LEXAPRO) 10 MG tablet Take 1 tablet (10 mg total) by mouth daily. 30 tablet 6  . metFORMIN (GLUCOPHAGE) 500 MG tablet Take 1 tablet (500 mg total) by mouth 2 (two) times daily with a meal. 90 tablet 0  . Triamcinolone Acetonide (TRIAMCINOLONE 0.1 % CREAM : EUCERIN) CREA Apply 1 application topically daily. As needed. 1 each 0  . valACYclovir (VALTREX) 1000 MG tablet TAKE 1 TABLET (1,000 MG TOTAL) BY MOUTH DAILY. 30 tablet 2   No current facility-administered medications for this visit.     Family History  Problem Relation Age of Onset  . Diabetes Mother   . Hypertension Mother   . Hyperlipidemia Mother   . Mental illness  Mother   . Pulmonary embolism Mother   . Cancer Maternal Grandfather        ?  Marland Kitchen Cancer Paternal Aunt        Cervical or Ovarian per patient    Review of Systems  Exam:   There were no vitals taken for this visit.    General appearance: alert, cooperative and appears stated age Head: normocephalic, without obvious abnormality, atraumatic Neck: no adenopathy, supple, symmetrical, trachea midline and thyroid normal to inspection and palpation Lungs: clear to auscultation bilaterally Breasts: normal appearance, no masses or tenderness, No nipple  retraction or dimpling, No nipple discharge or bleeding, No axillary adenopathy Heart: regular rate and rhythm Abdomen: soft, non-tender; no masses, no organomegaly Extremities: extremities normal, atraumatic, no cyanosis or edema Skin: skin color, texture, turgor normal. No rashes or lesions Lymph nodes: cervical, supraclavicular, and axillary nodes normal. Neurologic: grossly normal  Pelvic: External genitalia:  no lesions              No abnormal inguinal nodes palpated.              Urethra:  normal appearing urethra with no masses, tenderness or lesions              Bartholins and Skenes: normal                 Vagina: normal appearing vagina with normal color and discharge, no lesions              Cervix: no lesions              Pap taken: {yes no:314532} Bimanual Exam:  Uterus:  normal size, contour, position, consistency, mobility, non-tender              Adnexa: no mass, fullness, tenderness              Rectal exam: {yes no:314532}.  Confirms.              Anus:  normal sphincter tone, no lesions  Chaperone was present for exam.  Assessment:   Well woman visit with normal exam.   Plan: Mammogram screening discussed. Self breast awareness reviewed. Pap and HR HPV as above. Guidelines for Calcium, Vitamin D, regular exercise program including cardiovascular and weight bearing exercise.   Follow up annually and prn.   Additional counseling given.  {yes [id number]. _______ minutes face to face time of which over 50% was spent in counseling.    After visit summary provided.

## 2018-10-31 ENCOUNTER — Encounter: Payer: Self-pay | Admitting: Obstetrics and Gynecology

## 2018-10-31 ENCOUNTER — Ambulatory Visit: Payer: 59 | Admitting: Obstetrics and Gynecology

## 2018-10-31 NOTE — Progress Notes (Deleted)
28 y.o. G1P0010 Single African American female here for annual exam.    PCP:     No LMP recorded. (Menstrual status: Oral contraceptives).           Sexually active: {yes no:314532}  The current method of family planning is {contraception:315051}.    Exercising: {yes no:314532}  {types:19826} Smoker:  {YES NO:22349}  Health Maintenance: Pap: 04-10-17 ASCUS:Neg HR HPV History of abnormal Pap: *** Yes,04-10-17 ASCUS:Neg HR HPV;colpo revealing LGSIL and Neg ECC.CIN I, colpo with Dr.Fernandez;s/p CO2 Laser.HX CIN I and VIN I. MMG:  n/a Colonoscopy:  *** BMD:   ***  Result  *** TDaP: 11-18-13 Gardasil:   yes HIV: Patient has HIV currently Hep C: 04-10-17 Neg Screening Labs:  Hb today: ***, Urine today: ***   reports that she has never smoked. She has never used smokeless tobacco. She reports current alcohol use. She reports that she does not use drugs.  Past Medical History:  Diagnosis Date  . Acne   . Allergy    Allegra  . Asthma    mild; onset in childhood.  No hospitalizations.  . ASTHMA, EXERCISE INDUCED   . Chlamydia 03/03/2012  . CIN I (cervical intraepithelial neoplasia I) 08/31/2013, 05/01/17   colposcopy by Toney Rakes; s/p CO2 laser treatment. repeat colposcopy by Dr. Quincy Simmonds.  . Eczema   . Elevated blood sugar 12/25/2017  . Genital warts 08/31/2013   Aldara treatment; Toney Rakes.  . Grieving 12/25/2017  . Herpes   . Hidradenitis   . Hidradenitis axillaris 05/21/2014  . HIV (human immunodeficiency virus infection) ([facility])   . HSV-2 seropositive   . Migraine headache without aura   . Moderate episode of recurrent major depressive disorder ([facility]) 10/24/2018  . Substance abuse ([facility])   . Type 2 diabetes mellitus without complication, without long-term current use of insulin ([place]) 11/17/2014  . VIN I (vulvar intraepithelial neoplasia I) 08/31/2013   s/p CO2 treatment; Fernandez/gyn.    Past Surgical History:  Procedure Laterality Date  . BREAST SURGERY  02/01/2008   Reduction  .  CO2 LASER APPLICATION N/A 123456   Procedure: C02 laser of vagina and cervix;  Surgeon: Terrance Mass, MD;  Location: [facility];  Service: Gynecology;  Laterality: N/A;  . INCISION AND DRAINAGE ABSCESS Left 03/09/2017   Procedure: INCISION AND DRAINAGE ABSCESS LEFT BREAST ABSCESS;  Surgeon: Erroll Luna, MD;  Location: [facility];  Service: General;  Laterality: Left;    Current Outpatient Medications  Medication Sig Dispense Refill  . albuterol (PROVENTIL HFA;VENTOLIN HFA) 108 (90 Base) MCG/ACT inhaler Inhale 2 puffs into the lungs every 6 (six) hours as needed. 1 Inhaler 1  . bictegravir-emtricitabine-tenofovir AF (BIKTARVY) 50-200-25 MG TABS tablet Take 1 tablet by mouth daily. 30 tablet 11  . escitalopram (LEXAPRO) 10 MG tablet Take 1 tablet (10 mg total) by mouth daily. 30 tablet 6  . metFORMIN (GLUCOPHAGE) 500 MG tablet Take 1 tablet (500 mg total) by mouth 2 (two) times daily with a meal. 90 tablet 0  . Triamcinolone Acetonide (TRIAMCINOLONE 0.1 % CREAM : EUCERIN) CREA Apply 1 application topically daily. As needed. 1 each 0  . valACYclovir (VALTREX) 1000 MG tablet TAKE 1 TABLET (1,000 MG TOTAL) BY MOUTH DAILY. 30 tablet 2   No current facility-administered medications for this visit.     Family History  Problem Relation Age of Onset  . Diabetes Mother   . Hypertension Mother   . Hyperlipidemia Mother   . Mental illness Mother   . Pulmonary embolism  Mother   . Cancer Maternal Grandfather        ?  Marland Kitchen Cancer Paternal Aunt        Cervical or Ovarian per patient    Review of Systems  Exam:   There were no vitals taken for this visit.    General appearance: alert, cooperative and appears stated age Head: normocephalic, without obvious abnormality, atraumatic Neck: no adenopathy, supple, symmetrical, trachea midline and thyroid normal to inspection and palpation Lungs: clear to auscultation bilaterally Breasts: normal appearance, no masses or tenderness, No nipple retraction or  dimpling, No nipple discharge or bleeding, No axillary adenopathy Heart: regular rate and rhythm Abdomen: soft, non-tender; no masses, no organomegaly Extremities: extremities normal, atraumatic, no cyanosis or edema Skin: skin color, texture, turgor normal. No rashes or lesions Lymph nodes: cervical, supraclavicular, and axillary nodes normal. Neurologic: grossly normal  Pelvic: External genitalia:  no lesions              No abnormal inguinal nodes palpated.              Urethra:  normal appearing urethra with no masses, tenderness or lesions              Bartholins and Skenes: normal                 Vagina: normal appearing vagina with normal color and discharge, no lesions              Cervix: no lesions              Pap taken: {yes no:314532} Bimanual Exam:  Uterus:  normal size, contour, position, consistency, mobility, non-tender              Adnexa: no mass, fullness, tenderness              Rectal exam: {yes no:314532}.  Confirms.              Anus:  normal sphincter tone, no lesions  Chaperone was present for exam.  Assessment:   Well woman visit with normal exam.   Plan: Mammogram screening discussed. Self breast awareness reviewed. Pap and HR HPV as above. Guidelines for Calcium, Vitamin D, regular exercise program including cardiovascular and weight bearing exercise.   Follow up annually and prn.   Additional counseling given.  {yes [id number]. _______ minutes face to face time of which over 50% was spent in counseling.    After visit summary provided.

## 2018-11-01 ENCOUNTER — Ambulatory Visit: Payer: 59

## 2018-11-06 ENCOUNTER — Other Ambulatory Visit: Payer: Self-pay | Admitting: Infectious Disease

## 2018-11-07 MED FILL — BIKTARVY 50-200-25 MG TABS: 50-200-25 | 30 days supply | Qty: 30 | Fill #3

## 2018-11-09 ENCOUNTER — Other Ambulatory Visit: Payer: Self-pay | Admitting: Family

## 2018-11-09 MED ORDER — ESCITALOPRAM OXALATE 10 MG PO TABS: 10.0000 mg | ORAL_TABLET | Freq: Every day | ORAL | 1 refills | Status: DC

## 2018-11-09 MED FILL — valACYclovir HCL 1 GM TABS: 1 | 30 days supply | Qty: 30 | Fill #2

## 2018-12-04 ENCOUNTER — Other Ambulatory Visit: Payer: Self-pay | Admitting: Infectious Disease

## 2018-12-04 DIAGNOSIS — A6000 Herpesviral infection of urogenital system, unspecified: Secondary | ICD-10-CM

## 2018-12-06 MED FILL — BIKTARVY 50-200-25 MG TABS: 50-200-25 | 30 days supply | Qty: 30 | Fill #4

## 2018-12-06 MED FILL — valACYclovir HCL 1 GM TABS: 1 | 30 days supply | Qty: 30 | Fill #0

## 2018-12-07 ENCOUNTER — Encounter: Payer: Self-pay | Admitting: Family

## 2018-12-07 ENCOUNTER — Ambulatory Visit (INDEPENDENT_AMBULATORY_CARE_PROVIDER_SITE_OTHER): Payer: 59 | Admitting: Family

## 2018-12-07 ENCOUNTER — Other Ambulatory Visit: Payer: Self-pay

## 2018-12-07 VITALS — BP 118/80 | HR 89 | Temp 98.8°F | Ht 60.0 in | Wt 255.0 lb

## 2018-12-07 DIAGNOSIS — L309 Dermatitis, unspecified: Secondary | ICD-10-CM | POA: Diagnosis not present

## 2018-12-07 DIAGNOSIS — R7303 Prediabetes: Secondary | ICD-10-CM | POA: Diagnosis not present

## 2018-12-07 DIAGNOSIS — F4321 Adjustment disorder with depressed mood: Secondary | ICD-10-CM | POA: Diagnosis not present

## 2018-12-07 DIAGNOSIS — F331 Major depressive disorder, recurrent, moderate: Secondary | ICD-10-CM

## 2018-12-07 NOTE — Patient Instructions (Signed)
[facility] [address] [place], [place] [address]  [phone]  Walk-in from 8 am -3 pm

## 2018-12-07 NOTE — Progress Notes (Signed)
Kelly Hunter is a 28 y.o. female with the following history as recorded in EpicCare:  Patient Active Problem List   Diagnosis Date Noted  . Moderate episode of recurrent major depressive disorder ([place]) 10/24/2018  . Grief 12/25/2017  . Elevated blood sugar 12/25/2017  . Sepsis ([place]) 03/08/2017  . Breast abscess 03/08/2017  . Genital herpes 04/27/2016  . Type 2 diabetes mellitus without complication, without long-term current use of insulin ([place]) 11/17/2014  . Hidradenitis axillaris 05/21/2014  . HIV disease ([place]) 01/15/2014  . Asymptomatic HIV infection ([place]) 12/30/2013  . Eczema 10/22/2013  . Rhinitis, allergic 10/22/2013  . Condyloma acuminatum of vulva 06/18/2013  . VIN I (vulvar intraepithelial neoplasia I) 06/18/2013  . Dysplasia of cervix, low grade (CIN 1) 06/18/2013  . ASCUS with positive high risk HPV 06/04/2013  . Obesity 03/30/2006  . ASTHMA, EXERCISE INDUCED 03/30/2006  . ACNE 03/30/2006    Current Outpatient Medications  Medication Sig Dispense Refill  . albuterol (PROVENTIL HFA;VENTOLIN HFA) 108 (90 Base) MCG/ACT inhaler Inhale 2 puffs into the lungs every 6 (six) hours as needed. 1 Inhaler 1  . bictegravir-emtricitabine-tenofovir AF (BIKTARVY) 50-200-25 MG TABS tablet Take 1 tablet by mouth daily. 30 tablet 11  . escitalopram (LEXAPRO) 10 MG tablet Take 1 tablet (10 mg total) by mouth daily. 90 tablet 1  . metFORMIN (GLUCOPHAGE) 500 MG tablet TAKE 1 TABLET (500 MG TOTAL) BY MOUTH 2 (TWO) TIMES DAILY WITH A MEAL. 180 tablet 1  . Triamcinolone Acetonide (TRIAMCINOLONE 0.1 % CREAM : EUCERIN) CREA Apply 1 application topically daily. As needed. 1 each 0  . valACYclovir (VALTREX) 1000 MG tablet TAKE 1 TABLET (1,000 MG TOTAL) BY MOUTH DAILY. 30 tablet 3   No current facility-administered medications for this visit.     Allergies: Other, Peanut oil, Peanut-containing drug products, Pyridium [phenazopyridine hcl], and Bactrim [sulfamethoxazole-trimethoprim]  Past  Medical History:  Diagnosis Date  . Acne   . Allergy    Allegra  . Asthma    mild; onset in childhood.  No hospitalizations.  . ASTHMA, EXERCISE INDUCED   . Chlamydia 03/03/2012  . CIN I (cervical intraepithelial neoplasia I) 08/31/2013, 05/01/17   colposcopy by Toney Rakes; s/p CO2 laser treatment. repeat colposcopy by Dr. Quincy Simmonds.  . Eczema   . Elevated blood sugar 12/25/2017  . Genital warts 08/31/2013   Aldara treatment; Toney Rakes.  . Grieving 12/25/2017  . Herpes   . Hidradenitis   . Hidradenitis axillaris 05/21/2014  . HIV (human immunodeficiency virus infection) ([place])   . HSV-2 seropositive   . Migraine headache without aura   . Moderate episode of recurrent major depressive disorder ([place]) 10/24/2018  . Substance abuse ([place])   . Type 2 diabetes mellitus without complication, without long-term current use of insulin ([place]) 11/17/2014  . VIN I (vulvar intraepithelial neoplasia I) 08/31/2013   s/p CO2 treatment; Fernandez/gyn.    Past Surgical History:  Procedure Laterality Date  . BREAST SURGERY  02/01/2008   Reduction  . CO2 LASER APPLICATION N/A 123456   Procedure: C02 laser of vagina and cervix;  Surgeon: Terrance Mass, MD;  Location: [facility];  Service: Gynecology;  Laterality: N/A;  . INCISION AND DRAINAGE ABSCESS Left 03/09/2017   Procedure: INCISION AND DRAINAGE ABSCESS LEFT BREAST ABSCESS;  Surgeon: Erroll Luna, MD;  Location: [facility];  Service: General;  Laterality: Left;    Family History  Problem Relation Age of Onset  . Diabetes Mother   . Hypertension Mother   . Hyperlipidemia Mother   .  Mental illness Mother   . Pulmonary embolism Mother   . Cancer Maternal Grandfather        ?  Marland Kitchen Cancer Paternal Aunt        Cervical or Ovarian per patient    Social History   Tobacco Use  . Smoking status: Never Smoker  . Smokeless tobacco: Never Used  Substance Use Topics  . Alcohol use: Yes    Alcohol/week: 0.0 standard drinks    Comment: socially    Subjective:  3  month follow-up on pre-diabetes; has been put on Metformin 1000 mg for pre-diabetes by her ID provider; patient is tolerating well;   Not doing as well on Lexapro 10 mg as she had hoped; patient is concerned that she is bipolar and would like to see psychiatrist;  Still needs to schedule with her GYN;    Objective:  Vitals:   12/07/18 1317  BP: 118/80  Pulse: 89  Temp: 98.8 F (37.1 C)  TempSrc: Oral  SpO2: 98%  Weight: 255 lb 0.6 oz (115.7 kg)  Height: 5' (1.524 m)    General: Well developed, well nourished, in no acute distress  Skin : Warm and dry.  Head: Normocephalic and atraumatic  Lungs: Respirations unlabored; clear to auscultation bilaterally without wheeze, rales, rhonchi  CVS exam: normal rate and regular rhythm.  Neurologic: Alert and oriented; speech intact; face symmetrical; moves all extremities well; CNII-XII intact without focal deficit   Assessment:  1. Eczema, unspecified type   2. Moderate episode of recurrent major depressive disorder ([place])   3. Situational depression   4. Pre-diabetes     Plan:  1. Referral to dermatology per patient request; 2. Patient is given contact information for [facility]- she will self-refer; she will plan to have psychiatrist manage her medications going forward; is not suicidal; 4. Tolerating Metformin 1000 mg bid well; prescription has been covered by ID provider.   No follow-ups on file.  Orders Placed This Encounter  Procedures  . Ambulatory referral to Dermatology    Referral Priority:   Routine    Referral Type:   Consultation    Referral Reason:   Specialty Services Required    Requested Specialty:   Dermatology    Number of Visits Requested:   1    Requested Prescriptions    No prescriptions requested or ordered in this encounter

## 2018-12-21 ENCOUNTER — Ambulatory Visit: Payer: 59 | Admitting: Family

## 2018-12-21 ENCOUNTER — Other Ambulatory Visit: Payer: Self-pay

## 2018-12-21 ENCOUNTER — Encounter: Payer: Self-pay | Admitting: Family

## 2018-12-21 ENCOUNTER — Ambulatory Visit (INDEPENDENT_AMBULATORY_CARE_PROVIDER_SITE_OTHER): Payer: 59 | Admitting: Family

## 2018-12-21 DIAGNOSIS — F331 Major depressive disorder, recurrent, moderate: Secondary | ICD-10-CM | POA: Diagnosis not present

## 2018-12-21 NOTE — Progress Notes (Signed)
Kelly Hunter is a 28 y.o. female with the following history as recorded in EpicCare:  Patient Active Problem List   Diagnosis Date Noted  . Moderate episode of recurrent major depressive disorder ([place]) 10/24/2018  . Grief 12/25/2017  . Elevated blood sugar 12/25/2017  . Sepsis ([place]) 03/08/2017  . Breast abscess 03/08/2017  . Genital herpes 04/27/2016  . Type 2 diabetes mellitus without complication, without long-term current use of insulin ([place]) 11/17/2014  . Hidradenitis axillaris 05/21/2014  . HIV disease ([place]) 01/15/2014  . Asymptomatic HIV infection ([place]) 12/30/2013  . Eczema 10/22/2013  . Rhinitis, allergic 10/22/2013  . Condyloma acuminatum of vulva 06/18/2013  . VIN I (vulvar intraepithelial neoplasia I) 06/18/2013  . Dysplasia of cervix, low grade (CIN 1) 06/18/2013  . ASCUS with positive high risk HPV 06/04/2013  . Obesity 03/30/2006  . ASTHMA, EXERCISE INDUCED 03/30/2006  . ACNE 03/30/2006    Current Outpatient Medications  Medication Sig Dispense Refill  . albuterol (PROVENTIL HFA;VENTOLIN HFA) 108 (90 Base) MCG/ACT inhaler Inhale 2 puffs into the lungs every 6 (six) hours as needed. 1 Inhaler 1  . bictegravir-emtricitabine-tenofovir AF (BIKTARVY) 50-200-25 MG TABS tablet Take 1 tablet by mouth daily. 30 tablet 11  . escitalopram (LEXAPRO) 10 MG tablet Take 1 tablet (10 mg total) by mouth daily. 90 tablet 1  . metFORMIN (GLUCOPHAGE) 500 MG tablet TAKE 1 TABLET (500 MG TOTAL) BY MOUTH 2 (TWO) TIMES DAILY WITH A MEAL. 180 tablet 1  . Triamcinolone Acetonide (TRIAMCINOLONE 0.1 % CREAM : EUCERIN) CREA Apply 1 application topically daily. As needed. 1 each 0  . valACYclovir (VALTREX) 1000 MG tablet TAKE 1 TABLET (1,000 MG TOTAL) BY MOUTH DAILY. 30 tablet 3   No current facility-administered medications for this visit.     Allergies: Other, Peanut oil, Peanut-containing drug products, Pyridium [phenazopyridine hcl], and Bactrim [sulfamethoxazole-trimethoprim]  Past  Medical History:  Diagnosis Date  . Acne   . Allergy    Allegra  . Asthma    mild; onset in childhood.  No hospitalizations.  . ASTHMA, EXERCISE INDUCED   . Chlamydia 03/03/2012  . CIN I (cervical intraepithelial neoplasia I) 08/31/2013, 05/01/17   colposcopy by Toney Rakes; s/p CO2 laser treatment. repeat colposcopy by Dr. Quincy Simmonds.  . Eczema   . Elevated blood sugar 12/25/2017  . Genital warts 08/31/2013   Aldara treatment; Toney Rakes.  . Grieving 12/25/2017  . Herpes   . Hidradenitis   . Hidradenitis axillaris 05/21/2014  . HIV (human immunodeficiency virus infection) ([place])   . HSV-2 seropositive   . Migraine headache without aura   . Moderate episode of recurrent major depressive disorder ([place]) 10/24/2018  . Substance abuse ([place])   . Type 2 diabetes mellitus without complication, without long-term current use of insulin ([place]) 11/17/2014  . VIN I (vulvar intraepithelial neoplasia I) 08/31/2013   s/p CO2 treatment; Fernandez/gyn.    Past Surgical History:  Procedure Laterality Date  . BREAST SURGERY  02/01/2008   Reduction  . CO2 LASER APPLICATION N/A 123456   Procedure: C02 laser of vagina and cervix;  Surgeon: Terrance Mass, MD;  Location: [facility];  Service: Gynecology;  Laterality: N/A;  . INCISION AND DRAINAGE ABSCESS Left 03/09/2017   Procedure: INCISION AND DRAINAGE ABSCESS LEFT BREAST ABSCESS;  Surgeon: Erroll Luna, MD;  Location: [facility];  Service: General;  Laterality: Left;    Family History  Problem Relation Age of Onset  . Diabetes Mother   . Hypertension Mother   . Hyperlipidemia Mother   .  Mental illness Mother   . Pulmonary embolism Mother   . Cancer Maternal Grandfather        ?  Marland Kitchen Cancer Paternal Aunt        Cervical or Ovarian per patient    Social History   Tobacco Use  . Smoking status: Never Smoker  . Smokeless tobacco: Never Used  Substance Use Topics  . Alcohol use: Yes    Alcohol/week: 0.0 standard drinks    Comment: socially    Subjective:     I connected with Kelly Hunter on 12/21/18 at 11:00 AM EST by a video enabled telemedicine application and verified that I am speaking with the correct person using two identifiers. Provider is in office; patient is at her home; patient and provider are the only 2 people on the video call.    I discussed the limitations of evaluation and management by telemedicine and the availability of in person appointments. The patient expressed understanding and agreed to proceed.  Patient notes that her therapist has taken her out on short-term disability; patient notes that her therapist agrees that Lexapro is not the right medication; states that diagnosis of borderline personality disorder is being discussed; patient is wondering about treatment options.  Patient notes she is doing better- states today is a 7/10 ( 10 being the best day); not suicidal and optimistic about getting her mental health in a better place.     Objective:  There were no vitals filed for this visit.  General: Well developed, well nourished, in no acute distress  Head: Normocephalic and atraumatic  Lungs: Respirations unlabored;  Neurologic: Alert and oriented; speech intact; face symmetrical;   Assessment:  1. Moderate episode of recurrent major depressive disorder [facility])     Plan:  Patient notes her therapist is questioning borderline personality disorder; again stressed that patient needs to be under the care of a psychiatrist; she will reach out to [facility] and ask her therapist for recommendations; she will have her short-term disability manager contact our office if records are needed; follow-up if she needs a referral to psychiatry from our office.   No follow-ups on file.  No orders of the defined types were placed in this encounter.   Requested Prescriptions    No prescriptions requested or ordered in this encounter

## 2019-01-02 MED FILL — BIKTARVY 50-200-25 MG TABS: 50-200-25 | 30 days supply | Qty: 30 | Fill #5

## 2019-01-02 MED FILL — valACYclovir HCL 1 GM TABS: 1 | 30 days supply | Qty: 30 | Fill #1

## 2019-01-19 IMAGING — DX DG CHEST 1V PORT
1 series · 1 of 1 positions shown · non-contrast
Comparison: 04/29/2015

CLINICAL DATA: Left breast swelling

EXAM:
PORTABLE CHEST 1 VIEW

[chest ap]
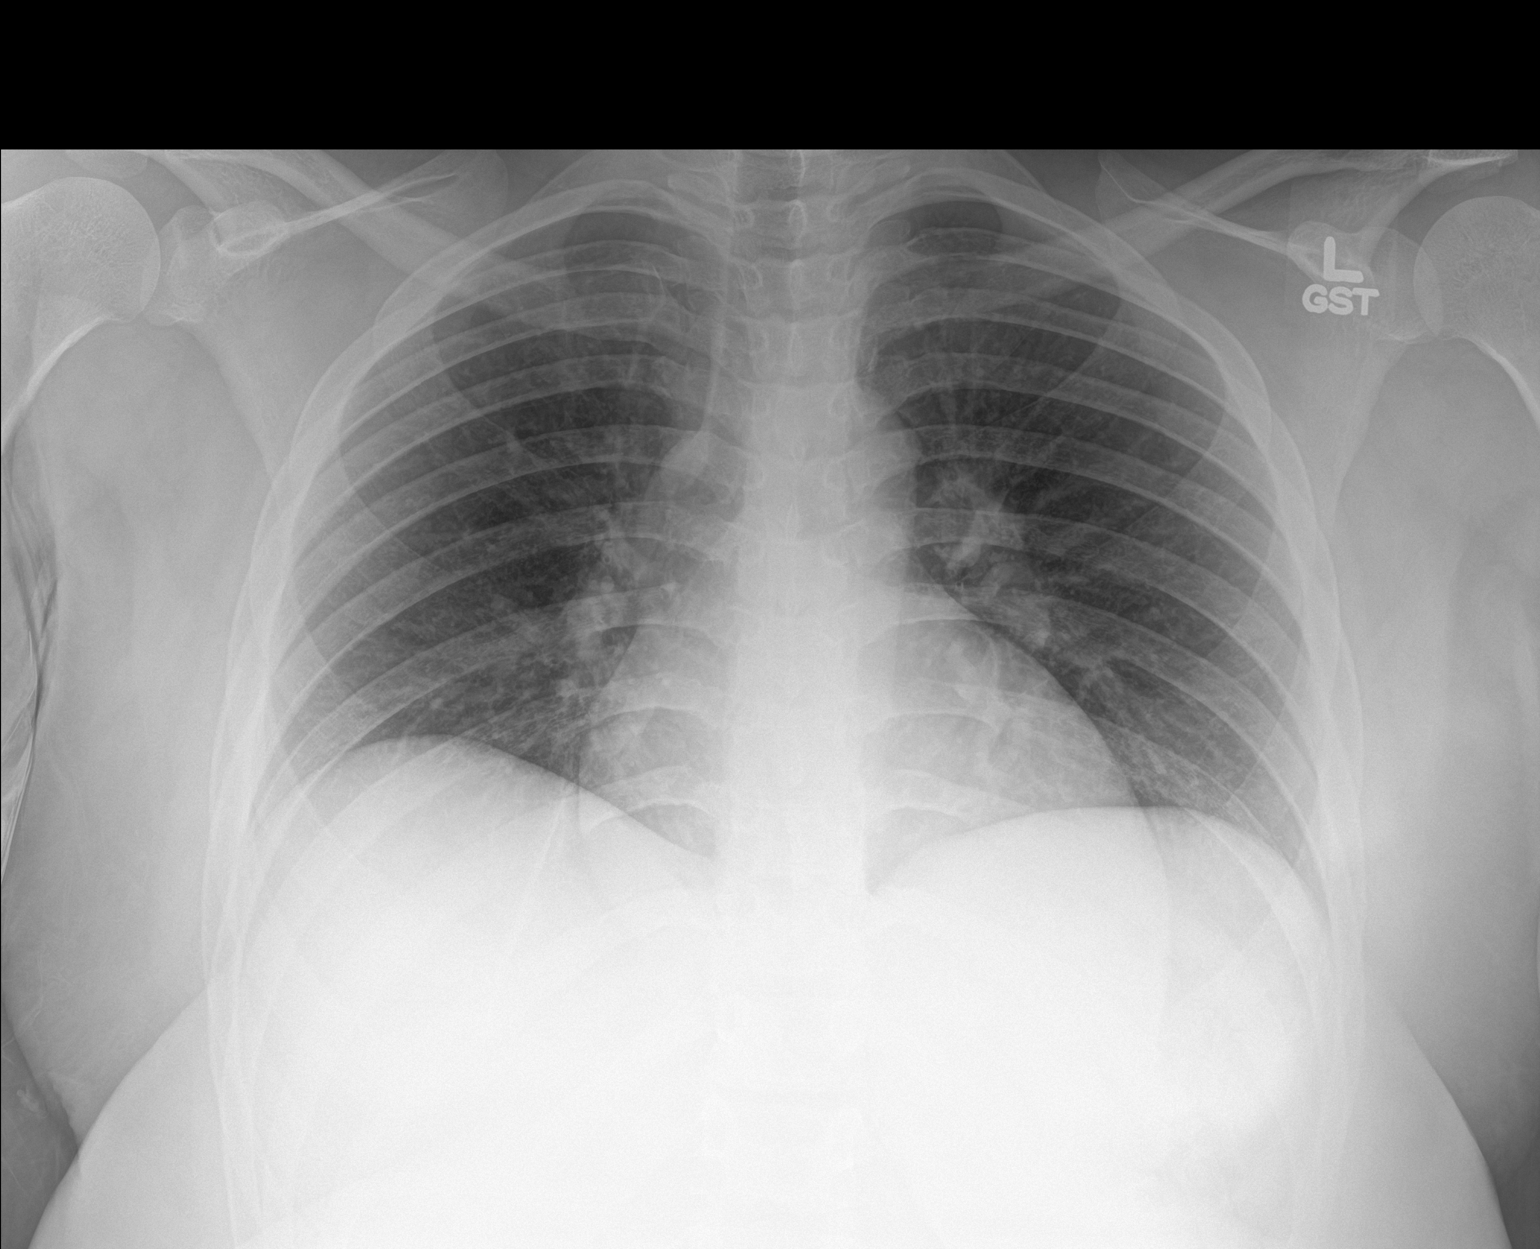

[1 of 1 positions shown; findings below may reference images not displayed]

FINDINGS: The heart size and mediastinal contours are within normal limits.
Both lungs are clear. The visualized skeletal structures are
unremarkable. Mildly low lung volume
IMPRESSION: No active disease.

## 2019-01-28 MED FILL — valACYclovir HCL 1 GM TABS: 1 | 30 days supply | Qty: 30 | Fill #2

## 2019-01-28 MED FILL — BIKTARVY 50-200-25 MG TABS: 50-200-25 | 30 days supply | Qty: 30 | Fill #6

## 2019-03-15 ENCOUNTER — Telehealth: Payer: Self-pay | Admitting: Pharmacy Technician

## 2019-03-15 MED FILL — BIKTARVY 50-200-25 MG TABS: 50-200-25 | 30 days supply | Qty: 30 | Fill #7

## 2019-03-15 MED FILL — valACYclovir HCL 1 GM TABS: 1 | 30 days supply | Qty: 30 | Fill #3

## 2019-03-15 NOTE — Telephone Encounter (Signed)
[facility] Patient Advocate Encounter   Was successful in obtaining a [place] copay card for [employer]. This copay card will make the patients copay $0.  The billing information is as follows and has been shared with [place].  RxBin: [id number] PCN: [id number] Member ID: [id number] Group ID: [id number]    Bartholomew Crews, [place] Patient [facility] Phone: [phone] Fax: [phone] 03/15/2019 9:52 AM

## 2019-03-25 ENCOUNTER — Encounter: Payer: Self-pay | Admitting: Family

## 2019-03-25 ENCOUNTER — Other Ambulatory Visit: Payer: Self-pay | Admitting: Family

## 2019-03-25 MED ORDER — METFORMIN HCL 500 MG PO TABS: 500.0000 mg | ORAL_TABLET | Freq: Two times a day (BID) | ORAL | 1 refills | Status: DC

## 2019-04-17 ENCOUNTER — Other Ambulatory Visit: Payer: Self-pay | Admitting: Infectious Disease

## 2019-04-17 DIAGNOSIS — A6000 Herpesviral infection of urogenital system, unspecified: Secondary | ICD-10-CM

## 2019-04-17 MED FILL — valACYclovir HCL 1 GM TABS: 1 | 30 days supply | Qty: 30 | Fill #0

## 2019-04-17 MED FILL — BIKTARVY 50-200-25 MG TABS: 50-200-25 | 30 days supply | Qty: 30 | Fill #8

## 2019-05-04 ENCOUNTER — Ambulatory Visit: Payer: 59

## 2019-05-16 MED FILL — BIKTARVY 50-200-25 MG TABS: 50-200-25 | 30 days supply | Qty: 30 | Fill #9

## 2019-05-16 MED FILL — valACYclovir HCL 1 GM TABS: 1 | 30 days supply | Qty: 30 | Fill #1

## 2019-06-11 ENCOUNTER — Ambulatory Visit: Payer: 59 | Admitting: Obstetrics and Gynecology

## 2019-06-17 MED FILL — valACYclovir HCL 1 GM TABS: 1 | 30 days supply | Qty: 30 | Fill #2

## 2019-06-17 MED FILL — BIKTARVY 50-200-25 MG TABS: 50-200-25 | 30 days supply | Qty: 30 | Fill #10

## 2019-07-02 DIAGNOSIS — R87810 Cervical high risk human papillomavirus (HPV) DNA test positive: Secondary | ICD-10-CM

## 2019-07-02 HISTORY — DX: Cervical high risk human papillomavirus (HPV) DNA test positive: R87.810

## 2019-07-04 NOTE — Progress Notes (Signed)
29 y.o. G1P0010 Single African American female here for annual exam.    States she is now diabetic and is on Metformin. Wants help with her diet.  Menses are consistent.  Pain for first 3 days.  Taking Ibuprofen.  Declines birth control prescription.   Patient wants STD testing.  States her HIV is undetectable with blood work.  Working for [employer]. Mom passed July 2019 - had a blood clot following hysterectomy.  Maternal GM passed from cancer and dementia August, 2020.  PCP: Jodi Mourning, MD    Patient's last menstrual period was 06/16/2019 (exact date).           Sexually active: Yes.    The current method of family planning is condoms everytime.    Exercising: No.  The patient does not participate in regular exercise at present. Smoker:  no  Health Maintenance: Pap: 04-10-17 ASCUS:Neg HR HPV, 2017 abnormal per patient, 06-18-14 LSIL;Pos HR HPV History of abnormal Pap:Yes, 06-18-14 LSIL;Pos HR HPV--Hx of Co2 laser for Cervical dysplasia and vulvar condyloma MMG:  n/a Colonoscopy:  n/a BMD:   n/a  Result  n/a TDaP: 11/18/13 Gardasil:   yes HIV:12-16-13 Reactive??--see further testing Hep C: 04-10-17 Neg Screening Labs:  PCP and ID.    reports that she has never smoked. She has never used smokeless tobacco. She reports current alcohol use. She reports that she does not use drugs.  Past Medical History:  Diagnosis Date  . Acne   . Allergy    Allegra  . Asthma    mild; onset in childhood.  No hospitalizations.  . ASTHMA, EXERCISE INDUCED   . Chlamydia 03/03/2012  . CIN I (cervical intraepithelial neoplasia I) 08/31/2013, 05/01/17   colposcopy by Toney Rakes; s/p CO2 laser treatment. repeat colposcopy by Dr. Quincy Simmonds.  . Eczema   . Elevated blood sugar 12/25/2017  . Genital warts 08/31/2013   Aldara treatment; Toney Rakes.  . Grieving 12/25/2017  . Herpes   . Hidradenitis   . Hidradenitis axillaris 05/21/2014  . HIV (human immunodeficiency virus infection) ([place])   .  HSV-2 seropositive   . Migraine headache without aura   . Moderate episode of recurrent major depressive disorder ([place]) 10/24/2018  . Substance abuse ([place])   . Type 2 diabetes mellitus without complication, without long-term current use of insulin ([place]) 11/17/2014  . VIN I (vulvar intraepithelial neoplasia I) 08/31/2013   s/p CO2 treatment; Fernandez/gyn.    Past Surgical History:  Procedure Laterality Date  . BREAST SURGERY  02/01/2008   Reduction  . CO2 LASER APPLICATION N/A 123456   Procedure: C02 laser of vagina and cervix;  Surgeon: Terrance Mass, MD;  Location: [facility];  Service: Gynecology;  Laterality: N/A;  . INCISION AND DRAINAGE ABSCESS Left 03/09/2017   Procedure: INCISION AND DRAINAGE ABSCESS LEFT BREAST ABSCESS;  Surgeon: Erroll Luna, MD;  Location: [facility];  Service: General;  Laterality: Left;    Current Outpatient Medications  Medication Sig Dispense Refill  . bictegravir-emtricitabine-tenofovir AF (BIKTARVY) 50-200-25 MG TABS tablet Take 1 tablet by mouth daily. 30 tablet 11  . metFORMIN (GLUCOPHAGE) 500 MG tablet Take 1 tablet (500 mg total) by mouth 2 (two) times daily with a meal. 180 tablet 1  . Triamcinolone Acetonide (TRIAMCINOLONE 0.1 % CREAM : EUCERIN) CREA Apply 1 application topically daily. As needed. 1 each 0  . valACYclovir (VALTREX) 1000 MG tablet TAKE 1 TABLET (1,000 MG TOTAL) BY MOUTH DAILY. 30 tablet 3   No current facility-administered medications for  this visit.    Family History  Problem Relation Age of Onset  . Diabetes Mother   . Hypertension Mother   . Hyperlipidemia Mother   . Mental illness Mother   . Pulmonary embolism Mother   . Cancer Maternal Grandfather        ?  Marland Kitchen Cancer Paternal Aunt        Cervical or Ovarian per patient    Review of Systems  All other systems reviewed and are negative.   Exam:   BP 124/82 (Cuff Size: Large)   Pulse 88   Temp 97.6 F (36.4 C) (Temporal)   Resp 18   Ht 5' 3.5" (1.613 m)   Wt 250 lb  (113.4 kg)   LMP 06/16/2019 (Exact Date)   BMI 43.59 kg/m     General appearance: alert, cooperative and appears stated age Head: normocephalic, without obvious abnormality, atraumatic Neck: no adenopathy, supple, symmetrical, trachea midline and thyroid normal to inspection and palpation Lungs: clear to auscultation bilaterally Breasts: normal appearance, no masses or tenderness, No nipple retraction or dimpling, No nipple discharge or bleeding, No axillary adenopathy Heart: regular rate and rhythm Abdomen: soft, non-tender; no masses, no organomegaly Extremities: extremities normal, atraumatic, no cyanosis or edema Skin: skin color, texture, turgor normal. No rashes or lesions Lymph nodes: cervical, supraclavicular, and axillary nodes normal. Neurologic: grossly normal  Pelvic: External genitalia:  no lesions              No abnormal inguinal nodes palpated.              Urethra:  normal appearing urethra with no masses, tenderness or lesions              Bartholins and Skenes: normal                 Vagina: normal appearing vagina with normal color and discharge, no lesions              Cervix: no lesions              Pap taken: Yes.   Bimanual Exam:  Uterus:  normal size, contour, position, consistency, mobility, non-tender              Adnexa: no mass, fullness, tenderness              Chaperone was present for exam.  Assessment:   Well woman visit with normal exam. Hx CIN I and VIN I.  Status post CO2 laser therapy.  HIV positive.  HSV 2.  On Valtrex. Hx chlamydia.  Migraines without aura.   Plan: Mammogram screening discussed. Self breast awareness reviewed. Pap and HR HPV as above. Guidelines for Calcium, Vitamin D, regular exercise program including cardiovascular and weight bearing exercise. Referral to diabetes and nutrition counseling.  STD screening.  Follow up annually and prn.   After visit summary provided.

## 2019-07-05 ENCOUNTER — Other Ambulatory Visit: Payer: Self-pay

## 2019-07-05 ENCOUNTER — Other Ambulatory Visit (HOSPITAL_COMMUNITY)
Admission: RE | Admit: 2019-07-05 | Discharge: 2019-07-05 | Disposition: A | Discharge status: Home / Self Care | Payer: 59 | Source: Ambulatory Visit | Attending: Obstetrics and Gynecology | Admitting: Obstetrics and Gynecology

## 2019-07-05 ENCOUNTER — Ambulatory Visit (INDEPENDENT_AMBULATORY_CARE_PROVIDER_SITE_OTHER): Payer: 59 | Admitting: Obstetrics and Gynecology

## 2019-07-05 ENCOUNTER — Encounter: Payer: Self-pay | Admitting: Obstetrics and Gynecology

## 2019-07-05 VITALS — BP 124/82 | HR 88 | Temp 97.6°F | Resp 18 | Ht 63.5 in | Wt 250.0 lb

## 2019-07-05 DIAGNOSIS — Z01419 Encounter for gynecological examination (general) (routine) without abnormal findings: Secondary | ICD-10-CM | POA: Diagnosis present

## 2019-07-05 DIAGNOSIS — E119 Type 2 diabetes mellitus without complications: Secondary | ICD-10-CM | POA: Diagnosis not present

## 2019-07-05 DIAGNOSIS — Z113 Encounter for screening for infections with a predominantly sexual mode of transmission: Secondary | ICD-10-CM

## 2019-07-05 NOTE — Patient Instructions (Signed)

## 2019-07-06 LAB — HEPATITIS C ANTIBODY: Hep C Virus Ab: <0.1 s/co ratio (ref 0.0–0.9)

## 2019-07-06 LAB — RPR: RPR Ser Ql: NONREACTIVE

## 2019-07-06 LAB — HEPATITIS B SURFACE ANTIGEN: Hepatitis B Surface Ag: NEGATIVE

## 2019-07-08 LAB — CYTOLOGY - PAP
Chlamydia: NEGATIVE
Comment: NEGATIVE
Comment: NEGATIVE
Comment: NEGATIVE
Comment: NORMAL
Diagnosis: NEGATIVE
High risk HPV: POSITIVE — AB
Neisseria Gonorrhea: NEGATIVE
Trichomonas: NEGATIVE

## 2019-07-10 ENCOUNTER — Encounter: Payer: Self-pay | Admitting: Obstetrics and Gynecology

## 2019-07-10 ENCOUNTER — Telehealth: Payer: Self-pay | Admitting: *Deleted

## 2019-07-10 NOTE — Telephone Encounter (Signed)
-----   Message from Nunzio Cobbs, MD sent at 07/10/2019  6:42 AM EDT ----- Please contact patient with results of testing.   Her pap showed positive HR HPV and normal cells.  Please schedule a repeat pap with me in 6 months.  Please place 6 month recall.   Testing for infectious disease is negative for syphilis, hepatitis B and C, trichomonas, gonorrhea, and chlamydia.

## 2019-07-10 NOTE — Telephone Encounter (Signed)
Message left to return call to Triage Nurse at [phone].   06 recall placed for 12/21.

## 2019-07-11 NOTE — Telephone Encounter (Signed)
Spoke with pt. Pt given results and recommendations per Dr Quincy Simmonds. Pt agreeable and verbalized understanding.  Pt scheduled for repeat 6 month follow up pap on 01/06/20 at 2:30 pm. Pt verbalized understanding.  06 recall placed.  Routing to Dr Quincy Simmonds for review. Encounter closed.

## 2019-08-09 ENCOUNTER — Other Ambulatory Visit: Payer: Self-pay | Admitting: Infectious Disease

## 2019-08-09 DIAGNOSIS — A6000 Herpesviral infection of urogenital system, unspecified: Secondary | ICD-10-CM

## 2019-08-09 MED FILL — valACYclovir HCL 1 GM TABS: 1 | 30 days supply | Qty: 30 | Fill #0

## 2019-08-13 MED FILL — BIKTARVY 50-200-25 MG TABS: 50-200-25 | 30 days supply | Qty: 30 | Fill #0

## 2019-09-16 MED FILL — valACYclovir HCL 1 GM TABS: 1 | 30 days supply | Qty: 30 | Fill #1

## 2019-09-16 MED FILL — BIKTARVY 50-200-25 MG TABS: 50-200-25 | 30 days supply | Qty: 30 | Fill #1

## 2019-10-08 ENCOUNTER — Other Ambulatory Visit: Payer: 59

## 2019-10-09 ENCOUNTER — Other Ambulatory Visit: Payer: Self-pay | Admitting: Family

## 2019-10-10 ENCOUNTER — Other Ambulatory Visit: Payer: Self-pay | Admitting: Infectious Disease

## 2019-10-14 ENCOUNTER — Other Ambulatory Visit: Payer: 59

## 2019-10-14 ENCOUNTER — Other Ambulatory Visit: Payer: Self-pay

## 2019-10-14 DIAGNOSIS — B20 Human immunodeficiency virus [HIV] disease: Secondary | ICD-10-CM

## 2019-10-14 DIAGNOSIS — E119 Type 2 diabetes mellitus without complications: Secondary | ICD-10-CM

## 2019-10-14 DIAGNOSIS — Z79899 Other long term (current) drug therapy: Secondary | ICD-10-CM

## 2019-10-14 DIAGNOSIS — N900 Mild vulvar dysplasia: Secondary | ICD-10-CM

## 2019-10-14 DIAGNOSIS — Z113 Encounter for screening for infections with a predominantly sexual mode of transmission: Secondary | ICD-10-CM

## 2019-10-14 DIAGNOSIS — F331 Major depressive disorder, recurrent, moderate: Secondary | ICD-10-CM

## 2019-10-15 LAB — T-HELPER CELL (CD4) - (RCID CLINIC ONLY)
CD4 % Helper T Cell: 49 % (ref 33–65)
CD4 T Cell Abs: 1673 /uL (ref 400–1790)

## 2019-10-15 MED FILL — BIKTARVY 50-200-25 MG TABS: 50-200-25 | 30 days supply | Qty: 30 | Fill #0

## 2019-10-17 LAB — COMPLETE METABOLIC PANEL WITH GFR
AG Ratio: 1.5 (calc) (ref 1.0–2.5)
ALT: 11 U/L (ref 6–29)
AST: 11 U/L (ref 10–30)
Albumin: 4.4 g/dL (ref 3.6–5.1)
Alkaline phosphatase (APISO): 45 U/L (ref 31–125)
BUN: 9 mg/dL (ref 7–25)
CO2: 28 mmol/L (ref 20–32)
Calcium: 9.4 mg/dL (ref 8.6–10.2)
Chloride: 103 mmol/L (ref 98–110)
Creat: 0.68 mg/dL (ref 0.50–1.10)
GFR, Est African American: 139 mL/min/{1.73_m2} (ref >=60)
GFR, Est Non African American: 120 mL/min/{1.73_m2} (ref >=60)
Globulin: 2.9 g/dL (calc) (ref 1.9–3.7)
Glucose, Bld: 96 mg/dL (ref 65–99)
Potassium: 4.2 mmol/L (ref 3.5–5.3)
Sodium: 138 mmol/L (ref 135–146)
Total Bilirubin: 0.3 mg/dL (ref 0.2–1.2)
Total Protein: 7.3 g/dL (ref 6.1–8.1)

## 2019-10-17 LAB — CBC WITH DIFFERENTIAL/PLATELET
Absolute Monocytes: 725 cells/uL (ref 200–950)
Basophils Absolute: 42 cells/uL (ref 0–200)
Basophils Relative: 0.4 %
Eosinophils Absolute: 74 cells/uL (ref 15–500)
Eosinophils Relative: 0.7 %
HCT: 38.9 % (ref 35.0–45.0)
Hemoglobin: 13.1 g/dL (ref 11.7–15.5)
Lymphs Abs: 3728 cells/uL (ref 850–3900)
MCH: 29 pg (ref 27.0–33.0)
MCHC: 33.7 g/dL (ref 32.0–36.0)
MCV: 86.1 fL (ref 80.0–100.0)
MPV: 9.8 fL (ref 7.5–12.5)
Monocytes Relative: 6.9 %
Neutro Abs: 5933 cells/uL (ref 1500–7800)
Neutrophils Relative %: 56.5 %
Platelets: 438 10*3/uL — ABNORMAL HIGH (ref 140–400)
RBC: 4.52 10*6/uL (ref 3.80–5.10)
RDW: 15.2 % — ABNORMAL HIGH (ref 11.0–15.0)
Total Lymphocyte: 35.5 %
WBC: 10.5 10*3/uL (ref 3.8–10.8)

## 2019-10-17 LAB — LIPID PANEL
Cholesterol: 166 mg/dL (ref <200)
HDL: 38 mg/dL — ABNORMAL LOW (ref >=50)
LDL Cholesterol (Calc): 103 mg/dL (calc) — ABNORMAL HIGH
Non-HDL Cholesterol (Calc): 128 mg/dL (calc) (ref <130)
Total CHOL/HDL Ratio: 4.4 (calc) (ref <5.0)
Triglycerides: 146 mg/dL (ref <150)

## 2019-10-17 LAB — HIV-1 RNA QUANT-NO REFLEX-BLD
HIV 1 RNA Quant: <20 Copies/mL — ABNORMAL HIGH
HIV-1 RNA Quant, Log: <1.3 Log cps/mL — ABNORMAL HIGH

## 2019-10-17 LAB — RPR: RPR Ser Ql: NONREACTIVE

## 2019-10-23 ENCOUNTER — Encounter: Payer: 59 | Admitting: Infectious Disease

## 2019-10-28 ENCOUNTER — Other Ambulatory Visit: Payer: Self-pay

## 2019-10-28 ENCOUNTER — Encounter: Payer: Self-pay | Admitting: Infectious Diseases

## 2019-10-28 ENCOUNTER — Other Ambulatory Visit (HOSPITAL_COMMUNITY)
Admission: RE | Admit: 2019-10-28 | Discharge: 2019-10-28 | Disposition: A | Discharge status: Home / Self Care | Payer: 59 | Source: Ambulatory Visit | Attending: Infectious Diseases | Admitting: Infectious Diseases

## 2019-10-28 ENCOUNTER — Other Ambulatory Visit: Payer: Self-pay | Admitting: Infectious Diseases

## 2019-10-28 ENCOUNTER — Telehealth: Payer: Self-pay

## 2019-10-28 ENCOUNTER — Encounter: Payer: Self-pay | Admitting: Obstetrics and Gynecology

## 2019-10-28 ENCOUNTER — Ambulatory Visit (INDEPENDENT_AMBULATORY_CARE_PROVIDER_SITE_OTHER): Payer: 59 | Admitting: Infectious Diseases

## 2019-10-28 ENCOUNTER — Other Ambulatory Visit: Payer: Self-pay | Admitting: Infectious Disease

## 2019-10-28 VITALS — BP 118/82 | HR 78 | Temp 98.6°F | Wt 242.0 lb

## 2019-10-28 DIAGNOSIS — A6000 Herpesviral infection of urogenital system, unspecified: Secondary | ICD-10-CM

## 2019-10-28 DIAGNOSIS — B20 Human immunodeficiency virus [HIV] disease: Secondary | ICD-10-CM | POA: Insufficient documentation

## 2019-10-28 MED ORDER — EMTRICITABINE-TENOFOVIR DF 200-300 MG PO TABS
1.0000 | ORAL_TABLET | Freq: Every day | ORAL | 5 refills | Status: DC
Start: 2019-10-28 — End: 2020-04-20

## 2019-10-28 MED ORDER — RALTEGRAVIR POTASSIUM 400 MG PO TABS
400.0000 mg | ORAL_TABLET | Freq: Two times a day (BID) | ORAL | 5 refills | Status: DC
Start: 2019-10-28 — End: 2019-11-25

## 2019-10-28 MED FILL — ISENTRESS 400 MG TABS: 400 | 30 days supply | Qty: 60 | Fill #0

## 2019-10-28 MED FILL — EMTRICITABINE-TENOFOVIR DF: 200-300 | 30 days supply | Qty: 30 | Fill #0

## 2019-10-28 NOTE — Progress Notes (Addendum)
[place], [place], [place], [address]                                                                  Phn. [phone]; Fax: [phone]                                                                             Date: 10/28/19  Reason for Visit: Newly pregnant with h/o HIV Primary Care provider:   HPI: Kelly ALTIDOR is a 29 y.o.old female with a PMH of HIV, diagnosed in 12/2013, initially started on Tivicay and Truvada followed by switching to a one pill regimen in 01/2014 followed by switch to [facility] in 08/2018 to get her off of an abacavir-containing regimen and to also reduce drug drug interaction with metformin given her obesity and DM. She has been virally undetectable since early 2016 till now and has been compliant with her ART. Her CD4 has grdaully increased from 420 and is upto 1600s.  Start date of her last period 8/18. She has been recently found to be pregnant. Urine pregnancy tests positive at home (4) and confirmed with bloodwork at an urgent care on Saturday. She has been told by ob/gyn that she is approx [redacted] weeks pregnant. LMP 8/18.   Discussed with her regarding switching Biktarvy to safer ART regimen which has more data in pregnancy. She agrees to switch to Truvada one  daily and Isentress twice a day. She has been previously compliant with her ART. Discussed with her HIV in pregnancy and importance of being virally undetectable to reduce the chances of maternal-fetal transmission. She has a single female partner with whom she is sexually active. She denies smoking, drinks alcohol occasionally and denies using any illicit drugs. She understands to avoid alcohol during the pregnancy. She works at [employer]. Overall she feels well and has no complaints today.   ROS:  Denies dysphagia, odynophagia, cough, fever, nausea, vomiting, diarrhea, constipation, weight loss, chills, night sweats, recent hospitalizations, rashes, joint complaints, shortness of breath, headaches, chest pain, abdominal pain, dysuria .  Current Outpatient Medications on File Prior to Visit  Medication Sig Dispense Refill  . metFORMIN (GLUCOPHAGE) 500 MG tablet TAKE 1 TABLET(500 MG) BY MOUTH TWICE DAILY WITH A MEAL 60 tablet 0  . Triamcinolone Acetonide (TRIAMCINOLONE 0.1 % CREAM : EUCERIN) CREA Apply 1 application topically daily. As needed. 1 each 0  . valACYclovir (VALTREX) 1000 MG tablet TAKE 1 TABLET BY MOUTH ONCE A DAY 30 tablet 1   No current facility-administered medications on file prior to visit.     Allergies  Allergen Reactions  .  Other Nausea And Vomiting and Swelling    SWELLING REACTION UNSPECIFIED  Tree Nuts (Almonds)  . Peanut Oil Nausea And Vomiting and Swelling    SWELLING REACTION UNSPECIFIED   . Peanut-Containing Drug Products Nausea And Vomiting and Swelling    SWELLING REACTION UNSPECIFIED   . Pyridium [Phenazopyridine Hcl] Nausea And Vomiting  . Bactrim [Sulfamethoxazole-Trimethoprim] Rash   Past Medical History:  Diagnosis Date  . Acne   . Allergy    Allegra  . Asthma    mild; onset in childhood.  No hospitalizations.  . ASTHMA, EXERCISE INDUCED   . Cervical high risk HPV (human papillomavirus) test positive 07/2019   cervical cancer screening due in December, 2021  . Chlamydia 03/03/2012  . CIN I (cervical intraepithelial neoplasia I) 08/31/2013, 05/01/17   colposcopy by Toney Rakes; s/p CO2 laser treatment. repeat colposcopy by Dr. Quincy Simmonds.  . Eczema   . Elevated blood sugar 12/25/2017  . Genital warts 08/31/2013   Aldara treatment; Toney Rakes.  . Grieving 12/25/2017  . Herpes   . Hidradenitis   . Hidradenitis axillaris 05/21/2014  . HIV (human immunodeficiency virus infection) ([place])   . HSV-2 seropositive   . Migraine headache without aura   .  Moderate episode of recurrent major depressive disorder ([place]) 10/24/2018  . Substance abuse ([place])   . Type 2 diabetes mellitus without complication, without long-term current use of insulin ([place]) 11/17/2014  . VIN I (vulvar intraepithelial neoplasia I) 08/31/2013   s/p CO2 treatment; [facility]andez/gyn.   Past Surgical History:  Procedure Laterality Date  . BREAST SURGERY  02/01/2008   Reduction  . CO2 LASER APPLICATION N/A 1/61/0960   Procedure: C02 laser of vagina and cervix;  Surgeon: Terrance Mass, MD;  Location: [facility];  Service: Gynecology;  Laterality: N/A;  . INCISION AND DRAINAGE ABSCESS Left 03/09/2017   Procedure: INCISION AND DRAINAGE ABSCESS LEFT BREAST ABSCESS;  Surgeon: Erroll Luna, MD;  Location: [facility];  Service: General;  Laterality: Left;   Social History   Socioeconomic History  . Marital status: Single    Spouse name: n/a  . Number of children: 0  . Years of education: Not on file  . Highest education level: Not on file  Occupational History  . Occupation: Freight forwarder    Comment: [employer]  Tobacco Use  . Smoking status: Never Smoker  . Smokeless tobacco: Never Used  Vaping Use  . Vaping Use: Never used  Substance and Sexual Activity  . Alcohol use: Yes    Alcohol/week: 0.0 standard drinks    Comment: socially  . Drug use: No    Types: Marijuana  . Sexual activity: Yes    Partners: Male    Birth control/protection: Condom    Comment: condoms everytime  Other Topics Concern  . Not on file  Social History Narrative   Marital status: single;  Not dating in 2016.      Children: none; never pregnant.      Lives: with mom; dad in [place].     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      Education: previously in school; took medical leave in 02/2012;  [employer] in [place].      Tobacco: vapor cigarette two months. Pt does not smoke.       Alcohol:  Socially; weekends; no DUIs.      Drugs: marijuana daily.  Started at age 42.         Sexual activity: sexually active; 36  total partners; males; Chlamydia in 03/2012.  Genital warts 08/2013.  HIV diagnosis 12/2013.      Seatbelt: 100%      Guns:  none   Social Determinants of Radio broadcast assistant Strain:   . Difficulty of Paying Living Expenses: Not on file  Food Insecurity:   . Worried About Charity fundraiser in the Last Year: Not on file  . Ran Out of Food in the Last Year: Not on file  Transportation Needs:   . Lack of Transportation (Medical): Not on file  . Lack of Transportation (Non-Medical): Not on file  Physical Activity:   . Days of Exercise per Week: Not on file  . Minutes of Exercise per Session: Not on file  Stress:   . Feeling of Stress : Not on file  Social Connections:   . Frequency of Communication with Friends and Family: Not on file  . Frequency of Social Gatherings with Friends and Family: Not on file  . Attends Religious Services: Not on file  . Active Member of Clubs or Organizations: Not on file  . Attends Archivist Meetings: Not on file  . Marital Status: Not on file  Intimate Partner Violence:   . Fear of Current or Ex-Partner: Not on file  . Emotionally Abused: Not on file  . Physically Abused: Not on file  . Sexually Abused: Not on file    Vitals  BP 118/82   Pulse 78   Temp 98.6 F (37 C) (Oral)   LMP 09/18/2019    Examination  Gen: Alert and oriented x 3, no acute distress, OBESE HEENT: [place]/AT, PERL, No scleral icterus, no pale conjunctivae, hearing normal, oral mucosa moist Neck: Supple, no lymphadenopathy Cardio: Regular rate and rhythm; +S1 and S2; no murmurs, gallops, or rubs Resp: CTAB; no wheezes, rhonchi, or rales GI: GRAVID ABDOMEN Extremities: No cyanosis, clubbing, or edema; +2 PT and DP pulses Skin: No rashes, lesions, or ecchymoses Neuro: No focal deficits; CNs II-XII intact; sensation intact; +5/5 MMS b/l UEs and LEs Psych: Calm, cooperative  Lab Results HIV 1 RNA Quant  Date Value   10/14/2019 <20 Copies/mL (H)  10/09/2018 <20 DETECTED copies/mL (A)  08/08/2018 <20 DETECTED copies/mL (A)   CD4 T Cell Abs (/uL)  Date Value  10/14/2019 1,673  10/09/2018 1,238  08/08/2018 1,164   Lab Results  Component Value Date   HIV1GENOSEQ REPORT 12/31/2013   Lab Results  Component Value Date   WBC 10.5 10/14/2019   HGB 13.1 10/14/2019   HCT 38.9 10/14/2019   MCV 86.1 10/14/2019   PLT 438 (H) 10/14/2019    Lab Results  Component Value Date   CREATININE 0.68 10/14/2019   BUN 9 10/14/2019   NA 138 10/14/2019   K 4.2 10/14/2019   CL 103 10/14/2019   CO2 28 10/14/2019   Lab Results  Component Value Date   ALT 11 10/14/2019   AST 11 10/14/2019   ALKPHOS 51 08/08/2018   BILITOT 0.3 10/14/2019    Lab Results  Component Value Date   CHOL 166 10/14/2019   TRIG 146 10/14/2019   HDL 38 (L) 10/14/2019   LDLCALC 103 (H) 10/14/2019   Lab Results  Component Value Date   HAV NON REACTIVE 12/31/2013   Lab Results  Component Value Date   HEPBSAG Negative 07/05/2019   HEPBSAB POS (A) 12/31/2013   Lab Results  Component Value Date   HCVAB NEGATIVE 12/31/2013   Lab Results  Component  Value Date   CHLAMYDIAWP Negative 07/05/2019   N Negative 07/05/2019   Lab Results  Component Value Date   GCPROBEAPT NOT DETECTED 04/29/2015   Lab Results  Component Value Date   QUANTGOLD NEGATIVE 12/31/2013     Syphilis Qunatiferon Vitamin D OI ppx if indicated HLA [id number] HPV Vacc Papsmear Anal Pap if indicated UA ( 6 monthly if on TDF) Dental    Health Maintenance: Immunization History  Administered Date(s) Administered  . HPV 9-valent 08/05/2013, 12/16/2013  . HPV Quadrivalent 06/04/2013  . Hepatitis A, Adult 05/21/2014, 11/17/2014  . Influenza Inj Mdck Quad Pf 09/24/2017  . Influenza,inj,Quad PF,6+ Mos 11/18/2013, 11/17/2014, 01/06/2016, 10/17/2016, 10/24/2018  . Meningococcal Mcv4o 08/01/2016, 10/17/2016  . Pneumococcal Conjugate-13 12/25/2017  .  Pneumococcal Polysaccharide-23 12/31/2013, 10/24/2018  . Tdap 11/18/2013       Assessment/Plan: 1. HIV in a heterosexual female currently pregnant Switch to Truvada once daily and Raltegravir PO BID Discussed regarding HIV care in pregnancy at length  Follow up with OBG for antenatal care  Follow up in 4 weeks   2. Hx of ASCUS: PAP in 6/21 with HPV+. will need regular pap smears and close followup.   3. Alcohol Use  counseled  4. STD Screening  Urine GC today Recent RPR negative  5. Immunization Denies covid and flu shot   I spent greater than 30 minutes with the patient including greater than 50% of time in face to face counsel of the patient and in coordination of their care.    Patient's labs were reviewed as well as his previous records. Patients questions were addressed and answered. Safe sex counseling done.    Rosiland Oz, MD Infectious Diseases  Office phone [phone] Fax no. [phone]

## 2019-10-28 NOTE — Telephone Encounter (Signed)
Patient states there was a issue with her insurance and she was not able to get new ARV's. I spoke with the pharmacy at [facility] long and was told they would send the issue to [facility] or [place].   Patient would like to known if she should continue her current regimen until this has been figured out?   Please advise.   Laverle Patter, RN

## 2019-10-28 NOTE — Telephone Encounter (Signed)
Start date of her last period 8/18. She has been told by ob/gyn that she is 5-[redacted] weeks pregnant.  Urine pregnancy tests positive at home (4) and confirmed with bloodwork at an urgent care on Saturday. Taking biktarvy daily, no missed doses. Has not yet taken her Biktarvy today, but did take it 9/26. Use [facility], they usually mail it to her. Will be able to pick up medication today. Usually works until 4:30, is checking to see if she can come at 1:45 to see provider.  Ok per Dr West Bali to schedule her today at 1:45 if patient is able to come.  Landis Gandy, RN

## 2019-10-28 NOTE — Telephone Encounter (Signed)
Patient sent the following message via MyChart.  Hello Dr. Quincy Simmonds  I hope all is well. Over the weekend I found out I am pregnant. I just wanted to inform you of this and find out is there anything I need to do. We have an appointment in December. I know I would need to have one sooner.

## 2019-10-28 NOTE — Telephone Encounter (Signed)
AEX 07/2019 H/O HSV 2, on valtrex  H/O HIV + H/o Chlamydia 2014 H/o HR HPV on pap 07/2019, has follow up pap scheduled for 01/06/20. G2P0, hx of AB LMP 09/18/19  Spoke with pt. Pt states taking 4 home UPTs that were positive  and being seen at [facility] on Saturday with positive beta HCG that dates her currently at 5-[redacted] weeks pregnant. Pt excited for pregnancy.   Pt states has hx of HIV+ and has appt with Dr West Bali today at 145 pm to change medication from [facility].  Pt states having yeast sx x 5 days of vaginal itching, discharge, denies odor. Denies fever, chills, abd cramps/pain, NVD, or vaginal bleeding or spotting at this time. Pt only having breast tenderness. Pt advised to have OV with Dr Quincy Simmonds for further evaluation and update from Dr West Bali. Pt agreeable. Pt scheduled on 10/30/19 at 230 pm. Pt verbalized understanding to date and time of appt.  Pt given names of OBGYN practices in [place] and to call and make new OB appt. Pt agreeable and thankful for advice.  Routing to Dr Quincy Simmonds for review.  Encounter closed.

## 2019-10-28 NOTE — Telephone Encounter (Signed)
Patient is returning call. Patient states she would like a return call around 11:45-12 or a mychart message would work as well.

## 2019-10-28 NOTE — Telephone Encounter (Signed)
Left message for pt to return call to triage RN. 

## 2019-10-29 LAB — URINE CYTOLOGY ANCILLARY ONLY
Chlamydia: NEGATIVE
Comment: NEGATIVE
Comment: NORMAL
Neisseria Gonorrhea: NEGATIVE

## 2019-10-29 NOTE — Progress Notes (Signed)
GYNECOLOGY  VISIT   HPI: 29 y.o.   Single  African American  female   G2P0010 with Patient's last menstrual period was 09/18/2019 (exact date).   here for vaginal itching and discharge--no odor.  Patient has had a positive pregnancy test at Urgent care.  Some breast tenderness and tired.   Has an appointment on November 22, 2019 with [facility].   She saw infectious disease on 10/28/19.  She has started on Truvada and Raltegravir.   She is on Metformin.   No prior outbreak of HSV.  On valtrex for prevention.   GYNECOLOGIC HISTORY: Patient's last menstrual period was 09/18/2019 (exact date). Contraception: None Menopausal hormone therapy:  n/a Last mammogram:  n/a Last pap smear: 07-05-19 Neg:Pos HR HPV, 04-10-17 ASCUS:Neg HR HPV, 2017 abnormal per patient, 06-18-14 LSIL;Pos HR HPV        OB History    Gravida  2   Para  0   Term  0   Preterm  0   AB  1   Living  0     SAB  0   TAB  0   Ectopic  0   Multiple  0   Live Births  0              Patient Active Problem List   Diagnosis Date Noted  . Moderate episode of recurrent major depressive disorder ([place]) 10/24/2018  . Grief 12/25/2017  . Elevated blood sugar 12/25/2017  . Sepsis ([place]) 03/08/2017  . Breast abscess 03/08/2017  . Genital herpes 04/27/2016  . Type 2 diabetes mellitus without complication, without long-term current use of insulin ([place]) 11/17/2014  . Hidradenitis axillaris 05/21/2014  . HIV disease ([place]) 01/15/2014  . Asymptomatic HIV infection ([place]) 12/30/2013  . Eczema 10/22/2013  . Rhinitis, allergic 10/22/2013  . Condyloma acuminatum of vulva 06/18/2013  . VIN I (vulvar intraepithelial neoplasia I) 06/18/2013  . Dysplasia of cervix, low grade (CIN 1) 06/18/2013  . ASCUS with positive high risk HPV 06/04/2013  . Obesity 03/30/2006  . ASTHMA, EXERCISE INDUCED 03/30/2006  . ACNE 03/30/2006    Past Medical History:  Diagnosis Date  . Acne   . Allergy    Allegra  .  Asthma    mild; onset in childhood.  No hospitalizations.  . ASTHMA, EXERCISE INDUCED   . Cervical high risk HPV (human papillomavirus) test positive 07/2019   cervical cancer screening due in December, 2021  . Chlamydia 03/03/2012  . CIN I (cervical intraepithelial neoplasia I) 08/31/2013, 05/01/17   colposcopy by Toney Rakes; s/p CO2 laser treatment. repeat colposcopy by Dr. Quincy Simmonds.  . Eczema   . Elevated blood sugar 12/25/2017  . Genital warts 08/31/2013   Aldara treatment; Toney Rakes.  . Grieving 12/25/2017  . Herpes   . Hidradenitis   . Hidradenitis axillaris 05/21/2014  . HIV (human immunodeficiency virus infection) ([place])   . HSV-2 seropositive   . Migraine headache without aura   . Moderate episode of recurrent major depressive disorder ([place]) 10/24/2018  . Substance abuse ([place])   . Type 2 diabetes mellitus without complication, without long-term current use of insulin ([place]) 11/17/2014  . VIN I (vulvar intraepithelial neoplasia I) 08/31/2013   s/p CO2 treatment; Fernandez/gyn.    Past Surgical History:  Procedure Laterality Date  . BREAST SURGERY  02/01/2008   Reduction  . CO2 LASER APPLICATION N/A 6/60/6301   Procedure: C02 laser of vagina and cervix;  Surgeon: Terrance Mass, MD;  Location: [facility];  Service: Gynecology;  Laterality: N/A;  . INCISION AND DRAINAGE ABSCESS Left 03/09/2017   Procedure: INCISION AND DRAINAGE ABSCESS LEFT BREAST ABSCESS;  Surgeon: Erroll Luna, MD;  Location: [facility];  Service: General;  Laterality: Left;    Current Outpatient Medications  Medication Sig Dispense Refill  . Prenatal Vit-Fe Fumarate-FA (PRENATAL MULTIVITAMIN) TABS tablet Take 1 tablet by mouth daily at 12 noon.    Marland Kitchen emtricitabine-tenofovir (TRUVADA) 200-300 MG tablet Take 1 tablet by mouth daily. 30 tablet 5  . metFORMIN (GLUCOPHAGE) 500 MG tablet TAKE 1 TABLET(500 MG) BY MOUTH TWICE DAILY WITH A MEAL 60 tablet 0  . raltegravir (ISENTRESS) 400 MG tablet Take 1 tablet (400 mg total) by mouth  2 (two) times daily. 60 tablet 5  . Triamcinolone Acetonide (TRIAMCINOLONE 0.1 % CREAM : EUCERIN) CREA Apply 1 application topically daily. As needed. 1 each 0  . valACYclovir (VALTREX) 1000 MG tablet TAKE 1 TABLET BY MOUTH ONCE A DAY 30 tablet 1   No current facility-administered medications for this visit.     ALLERGIES: Other, Peanut oil, Peanut-containing drug products, Pyridium [phenazopyridine hcl], and Bactrim [sulfamethoxazole-trimethoprim]  Family History  Problem Relation Age of Onset  . Diabetes Mother   . Hypertension Mother   . Hyperlipidemia Mother   . Mental illness Mother   . Pulmonary embolism Mother   . Cancer Maternal Grandfather        ?  Marland Kitchen Cancer Paternal Aunt        Cervical or Ovarian per patient    Social History   Socioeconomic History  . Marital status: Single    Spouse name: n/a  . Number of children: 0  . Years of education: Not on file  . Highest education level: Not on file  Occupational History  . Occupation: Freight forwarder    Comment: [employer]  Tobacco Use  . Smoking status: Never Smoker  . Smokeless tobacco: Never Used  Vaping Use  . Vaping Use: Never used  Substance and Sexual Activity  . Alcohol use: Yes    Alcohol/week: 0.0 standard drinks    Comment: socially  . Drug use: No    Types: Marijuana  . Sexual activity: Yes    Partners: Male    Birth control/protection: Condom    Comment: condoms everytime  Other Topics Concern  . Not on file  Social History Narrative   Marital status: single;  Not dating in 2016.      Children: none; never pregnant.      Lives: with mom; dad in [place].     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      Education: previously in school; took medical leave in 02/2012; [employer] in [place].      Tobacco: vapor cigarette two months. Pt does not smoke.       Alcohol:  Socially; weekends; no DUIs.      Drugs: marijuana daily.  Started at age 71.        Sexual activity:  sexually active; 25 total partners; males; Chlamydia in 03/2012.  Genital warts 08/2013.  HIV diagnosis 12/2013.      Seatbelt: 100%      Guns:  none   Social Determinants of Radio broadcast assistant Strain:   . Difficulty of Paying Living Expenses: Not on file  Food Insecurity:   . Worried About Charity fundraiser in the Last Year: Not on file  . Ran Out of Food in  the Last Year: Not on file  Transportation Needs:   . Lack of Transportation (Medical): Not on file  . Lack of Transportation (Non-Medical): Not on file  Physical Activity:   . Days of Exercise per Week: Not on file  . Minutes of Exercise per Session: Not on file  Stress:   . Feeling of Stress : Not on file  Social Connections:   . Frequency of Communication with Friends and Family: Not on file  . Frequency of Social Gatherings with Friends and Family: Not on file  . Attends Religious Services: Not on file  . Active Member of Clubs or Organizations: Not on file  . Attends Archivist Meetings: Not on file  . Marital Status: Not on file  Intimate Partner Violence:   . Fear of Current or Ex-Partner: Not on file  . Emotionally Abused: Not on file  . Physically Abused: Not on file  . Sexually Abused: Not on file    Review of Systems  All other systems reviewed and are negative.   PHYSICAL EXAMINATION:    BP 118/70 (Cuff Size: Large)   Pulse 98   Ht 5' 3.5" (1.613 m)   Wt 241 lb (109.3 kg)   LMP 09/18/2019 (Exact Date)   BMI 42.02 kg/m     General appearance: alert, cooperative and appears stated age   Pelvic: External genitalia:  no lesions              Urethra:  normal appearing urethra with no masses, tenderness or lesions              Bartholins and Skenes: normal                 Vagina: normal appearing vagina with normal color and small amount of white discharge, no lesions              Cervix: no lesions                Bimanual Exam:  Uterus:  normal size, contour, position, consistency,  mobility, non-tender              Adnexa: no mass, fullness, tenderness           Chaperone was present for exam.  ASSESSMENT  Vaginitis.  Early pregnancy.  HIV positive.  On suppression medication.  On Valtrex for HSV suppression.   PLAN  Affirm.  If has yeast, will treat with Monistat.  OK to continue Valtrex and Metformin.  Establish care with [facility].  We reviewed avoidance of exposures. We discussed reading What to Expect When Expecting.

## 2019-10-30 ENCOUNTER — Other Ambulatory Visit: Payer: Self-pay | Admitting: Infectious Diseases

## 2019-10-30 ENCOUNTER — Other Ambulatory Visit: Payer: Self-pay

## 2019-10-30 ENCOUNTER — Ambulatory Visit (INDEPENDENT_AMBULATORY_CARE_PROVIDER_SITE_OTHER): Payer: 59 | Admitting: Obstetrics and Gynecology

## 2019-10-30 ENCOUNTER — Encounter: Payer: Self-pay | Admitting: Obstetrics and Gynecology

## 2019-10-30 VITALS — BP 118/70 | HR 98 | Ht 63.5 in | Wt 241.0 lb

## 2019-10-30 DIAGNOSIS — N760 Acute vaginitis: Secondary | ICD-10-CM

## 2019-10-30 MED FILL — valACYclovir HCL 1 GM TABS: 1 | 30 days supply | Qty: 30 | Fill #0

## 2019-10-30 NOTE — Telephone Encounter (Signed)
error 

## 2019-10-31 LAB — VAGINITIS/VAGINOSIS, DNA PROBE
Candida Species: POSITIVE — AB
Gardnerella vaginalis: NEGATIVE
Trichomonas vaginosis: NEGATIVE

## 2019-11-11 NOTE — Telephone Encounter (Signed)
Attempted to call patient to update her on status of forms. Dr. West Bali was able to complete forms for patient.  [place]

## 2019-11-19 ENCOUNTER — Telehealth: Payer: Self-pay | Admitting: *Deleted

## 2019-11-19 NOTE — Telephone Encounter (Signed)
So this is re her HIV and pregnancy? WE would alogn with OB follow her more frequently with pregnancy not because of flares but because we want to be more compulsive re viral suppression than ever

## 2019-11-19 NOTE — Telephone Encounter (Signed)
Received call from [place], [employer], multimedia for patient's pregnancy. They are asking specific questions regarding episodic flares related to her pregnancy.  [facility] stated that they are only able to accept documentation from one provider. RN asked [facility] to fax [place] the paperwork as it is and a list of the questions they  Have.  RN contacted patient, relayed the issues. She sees OBGYN on 10/21 for first visit. She was planning on having them fill out FMLA as well, thought she was allowed to have both providers send in paperwork. She will also follow up with [facility], will send Mychart message with update. RN to follow. Landis Gandy, RN

## 2019-11-19 NOTE — Telephone Encounter (Signed)
Correct. However, [facility] was asking about flares specific to her pregnancy (seemed to be asking [place] to predict pattern of symptoms of early pregnancy).  She will be following with OBGYN for that. [name] will call [facility] for clarification, will follow up with me for how we can help her next.

## 2019-11-20 NOTE — Telephone Encounter (Signed)
Ok thx [place]

## 2019-11-21 LAB — OB RESULTS CONSOLE ANTIBODY SCREEN: Antibody Screen: NEGATIVE

## 2019-11-21 LAB — OB RESULTS CONSOLE RUBELLA ANTIBODY, IGM: Rubella: IMMUNE

## 2019-11-21 LAB — OB RESULTS CONSOLE ABO/RH: RH Type: POSITIVE

## 2019-11-21 LAB — OB RESULTS CONSOLE HEPATITIS B SURFACE ANTIGEN: Hepatitis B Surface Ag: NEGATIVE

## 2019-11-21 LAB — OB RESULTS CONSOLE HIV ANTIBODY (ROUTINE TESTING): HIV: REACTIVE

## 2019-11-21 LAB — OB RESULTS CONSOLE RPR: RPR: NONREACTIVE

## 2019-11-21 MED FILL — EMTRICITABINE-TENOFOVIR DF: 200-300 | 30 days supply | Qty: 30 | Fill #1

## 2019-11-21 MED FILL — ISENTRESS 400 MG TABS: 400 | 30 days supply | Qty: 60 | Fill #1

## 2019-11-22 ENCOUNTER — Other Ambulatory Visit: Payer: Self-pay | Admitting: Family

## 2019-11-23 MED FILL — valACYclovir HCL 1 GM TABS: 1 | 30 days supply | Qty: 30 | Fill #1

## 2019-11-25 ENCOUNTER — Encounter: Payer: Self-pay | Admitting: Infectious Disease

## 2019-11-25 ENCOUNTER — Telehealth: Payer: Self-pay | Admitting: Family

## 2019-11-25 ENCOUNTER — Other Ambulatory Visit: Payer: Self-pay | Admitting: Infectious Disease

## 2019-11-25 ENCOUNTER — Ambulatory Visit (INDEPENDENT_AMBULATORY_CARE_PROVIDER_SITE_OTHER): Payer: 59 | Admitting: Infectious Disease

## 2019-11-25 ENCOUNTER — Other Ambulatory Visit: Payer: Self-pay | Admitting: Family

## 2019-11-25 ENCOUNTER — Other Ambulatory Visit (HOSPITAL_COMMUNITY)
Admission: RE | Admit: 2019-11-25 | Discharge: 2019-11-25 | Disposition: A | Discharge status: Home / Self Care | Payer: 59 | Source: Ambulatory Visit | Attending: Infectious Disease | Admitting: Infectious Disease

## 2019-11-25 ENCOUNTER — Other Ambulatory Visit: Payer: Self-pay

## 2019-11-25 VITALS — BP 121/77 | HR 98 | Temp 98.9°F | Wt 243.0 lb

## 2019-11-25 DIAGNOSIS — Z6839 Body mass index (BMI) 39.0-39.9, adult: Secondary | ICD-10-CM

## 2019-11-25 DIAGNOSIS — E119 Type 2 diabetes mellitus without complications: Secondary | ICD-10-CM | POA: Diagnosis not present

## 2019-11-25 DIAGNOSIS — O09891 Supervision of other high risk pregnancies, first trimester: Secondary | ICD-10-CM | POA: Diagnosis not present

## 2019-11-25 DIAGNOSIS — Z23 Encounter for immunization: Secondary | ICD-10-CM

## 2019-11-25 DIAGNOSIS — B20 Human immunodeficiency virus [HIV] disease: Secondary | ICD-10-CM | POA: Insufficient documentation

## 2019-11-25 DIAGNOSIS — A6004 Herpesviral vulvovaginitis: Secondary | ICD-10-CM

## 2019-11-25 DIAGNOSIS — O09899 Supervision of other high risk pregnancies, unspecified trimester: Secondary | ICD-10-CM

## 2019-11-25 DIAGNOSIS — E66812 Obesity, class 2: Secondary | ICD-10-CM

## 2019-11-25 HISTORY — DX: Supervision of other high risk pregnancies, unspecified trimester: O09.899

## 2019-11-25 MED ORDER — TIVICAY 50 MG PO TABS: 50.0000 mg | ORAL_TABLET | Freq: Every day | ORAL | 11 refills | Status: DC

## 2019-11-25 MED FILL — TIVICAY 50 MG TABLET: 50 | 30 days supply | Qty: 30 | Fill #0

## 2019-11-25 NOTE — Progress Notes (Signed)
Chief complaint: Follow-up for HIV while pregnant.  Subjective:    Patient ID: Kelly Hunter, female    DOB: 1990/06/20, 29 y.o.   MRN: [id number]  HPI  29year-old African-American lady with HIV disease with perfect virological suppression on [place] who has now developed comorbid diabetes mellitus.  She has had weight gain of roughly 40 pounds while on antiretrovirals which have included the newer preferred integrase strand transfer inhibitors.   We changed to [facility] with perfect virological suppression.  Since she has become pregnant and was in the first few weeks of her pregnancy was seen by my partner Dr. [place] Bali, and changed to twice a day Isentress plus once daily Truvada.  She is tolerating his medications well but does not like taking multiple pills multiple times per day.  I reviewed with her the data from [place] and current updated DHHS guidelines which include TIVICAY as a preferred regimen throughout pregnancy and including for women he might become pregnant.  I offered options of TIVICAY plus Truvada versus TIVICAY plus DESCOVY.  We ended up going for TIVICAY and Truvada.             Past Medical History:  Diagnosis Date  . Acne   . Allergy    Allegra  . Asthma    mild; onset in childhood.  No hospitalizations.  . ASTHMA, EXERCISE INDUCED   . Cervical high risk HPV (human papillomavirus) test positive 07/2019   cervical cancer screening due in December, 2021  . Chlamydia 03/03/2012  . CIN I (cervical intraepithelial neoplasia I) 08/31/2013, 05/01/17   colposcopy by Toney Rakes; s/p CO2 laser treatment. repeat colposcopy by Dr. Quincy Simmonds.  . Eczema   . Elevated blood sugar 12/25/2017  . Genital warts 08/31/2013   Aldara treatment; Toney Rakes.  . Grieving 12/25/2017  . Herpes   . Hidradenitis   . Hidradenitis axillaris 05/21/2014  . HIV (human immunodeficiency virus infection) ([place])   . HSV-2 seropositive   . Migraine headache without aura   .  Moderate episode of recurrent major depressive disorder ([place]) 10/24/2018  . Substance abuse ([place])   . Type 2 diabetes mellitus without complication, without long-term current use of insulin ([place]) 11/17/2014  . VIN I (vulvar intraepithelial neoplasia I) 08/31/2013   s/p CO2 treatment; Fernandez/gyn.    Past Surgical History:  Procedure Laterality Date  . BREAST SURGERY  02/01/2008   Reduction  . CO2 LASER APPLICATION N/A 5/95/6387   Procedure: C02 laser of vagina and cervix;  Surgeon: Terrance Mass, MD;  Location: [facility];  Service: Gynecology;  Laterality: N/A;  . INCISION AND DRAINAGE ABSCESS Left 03/09/2017   Procedure: INCISION AND DRAINAGE ABSCESS LEFT BREAST ABSCESS;  Surgeon: Erroll Luna, MD;  Location: [facility];  Service: General;  Laterality: Left;    Family History  Problem Relation Age of Onset  . Diabetes Mother   . Hypertension Mother   . Hyperlipidemia Mother   . Mental illness Mother   . Pulmonary embolism Mother   . Cancer Maternal Grandfather        ?  Marland Kitchen Cancer Paternal Aunt        Cervical or Ovarian per patient      Social History   Socioeconomic History  . Marital status: Single    Spouse name: n/a  . Number of children: 0  . Years of education: Not on file  . Highest education level: Not on file  Occupational History  . Occupation: Freight forwarder    Comment:  [employer]  Tobacco Use  . Smoking status: Never Smoker  . Smokeless tobacco: Never Used  Vaping Use  . Vaping Use: Never used  Substance and Sexual Activity  . Alcohol use: Yes    Alcohol/week: 0.0 standard drinks    Comment: socially  . Drug use: No    Types: Marijuana  . Sexual activity: Yes    Partners: Male    Birth control/protection: Condom    Comment: condoms everytime  Other Topics Concern  . Not on file  Social History Narrative   Marital status: single;  Not dating in 2016.      Children: none; never pregnant.      Lives: with mom; dad in [place].     Employment: works full time at [employer] as  Freight forwarder; also working at [employer] as Freight forwarder.      Education: previously in school; took medical leave in 02/2012; [employer] in [place].      Tobacco: vapor cigarette two months. Pt does not smoke.       Alcohol:  Socially; weekends; no DUIs.      Drugs: marijuana daily.  Started at age 69.        Sexual activity: sexually active; 25 total partners; males; Chlamydia in 03/2012.  Genital warts 08/2013.  HIV diagnosis 12/2013.      Seatbelt: 100%      Guns:  none   Social Determinants of Radio broadcast assistant Strain:   . Difficulty of Paying Living Expenses: Not on file  Food Insecurity:   . Worried About Charity fundraiser in the Last Year: Not on file  . Ran Out of Food in the Last Year: Not on file  Transportation Needs:   . Lack of Transportation (Medical): Not on file  . Lack of Transportation (Non-Medical): Not on file  Physical Activity:   . Days of Exercise per Week: Not on file  . Minutes of Exercise per Session: Not on file  Stress:   . Feeling of Stress : Not on file  Social Connections:   . Frequency of Communication with Friends and Family: Not on file  . Frequency of Social Gatherings with Friends and Family: Not on file  . Attends Religious Services: Not on file  . Active Member of Clubs or Organizations: Not on file  . Attends Archivist Meetings: Not on file  . Marital Status: Not on file    Allergies  Allergen Reactions  . Other Nausea And Vomiting and Swelling    SWELLING REACTION UNSPECIFIED  Tree Nuts (Almonds)  . Peanut Oil Nausea And Vomiting and Swelling    SWELLING REACTION UNSPECIFIED   . Peanut-Containing Drug Products Nausea And Vomiting and Swelling    SWELLING REACTION UNSPECIFIED   . Pyridium [Phenazopyridine Hcl] Nausea And Vomiting  . Bactrim [Sulfamethoxazole-Trimethoprim] Rash     Current Outpatient Medications:  .  emtricitabine-tenofovir (TRUVADA) 200-300 MG tablet, Take 1 tablet by mouth daily., Disp: 30 tablet, Rfl:  5 .  metFORMIN (GLUCOPHAGE) 500 MG tablet, TAKE 1 TABLET(500 MG) BY MOUTH TWICE DAILY WITH A MEAL, Disp: 60 tablet, Rfl: 0 .  Prenatal Vit-Fe Fumarate-FA (PRENATAL MULTIVITAMIN) TABS tablet, Take 1 tablet by mouth daily at 12 noon., Disp: , Rfl:  .  raltegravir (ISENTRESS) 400 MG tablet, Take 1 tablet (400 mg total) by mouth 2 (two) times daily., Disp: 60 tablet, Rfl: 5 .  Triamcinolone Acetonide (TRIAMCINOLONE 0.1 % CREAM : EUCERIN) CREA, Apply 1 application topically daily. As  needed., Disp: 1 each, Rfl: 0 .  valACYclovir (VALTREX) 1000 MG tablet, TAKE 1 TABLET BY MOUTH ONCE A DAY, Disp: 30 tablet, Rfl: 1   Review of Systems  Constitutional: Negative for activity change, appetite change, chills, diaphoresis, fatigue, fever and unexpected weight change.  HENT: Negative for congestion, rhinorrhea, sinus pressure, sneezing, sore throat and trouble swallowing.   Eyes: Negative for photophobia and visual disturbance.  Respiratory: Negative for cough, shortness of breath, wheezing and stridor.   Cardiovascular: Negative for chest pain, palpitations and leg swelling.  Gastrointestinal: Positive for nausea. Negative for abdominal distention, abdominal pain, anal bleeding, blood in stool, constipation, diarrhea and vomiting.  Genitourinary: Negative for difficulty urinating, dysuria, flank pain and hematuria.  Musculoskeletal: Negative for arthralgias, back pain, gait problem, joint swelling and myalgias.  Skin: Negative for color change, pallor, rash and wound.  Neurological: Negative for dizziness, tremors, weakness and light-headedness.  Hematological: Negative for adenopathy. Does not bruise/bleed easily.  Psychiatric/Behavioral: Positive for . Negative for agitation, behavioral problems, confusion, decreased concentration and sleep disturbance.      Objective:   Physical Exam  Constitutional: She is oriented to person, place, and time. She appears well-developed and well-nourished. No  distress.  HENT:  Head: Normocephalic and atraumatic.  Eyes: EOM are normal. No scleral icterus.  Neck: Normal range of motion. Neck supple.  Cardiovascular: Normal rate and regular rhythm.  Pulmonary/Chest: Effort normal. No respiratory distress. She has no wheezes.  Abdominal: She exhibits no distension.  Musculoskeletal:        General: No tenderness or edema.  Neurological: She is alert and oriented to person, place, and time. She exhibits normal muscle tone. Coordination normal.  Skin: Skin is warm and dry. No rash noted. She is not diaphoretic. No erythema. No pallor.  Psychiatric: Her speech is normal and behavior is normal. Judgment and thought content normal.       Assessment & Plan:    HIV:   Check viral load today switch over to [facility] and Truvada and see back in 2 months time.   Weight gain: On this after the pregnancy  Diabetes mellitus on metformin: followed by PCP  HSV: renew valtrex  COVID prevention: I spent greater than 40 minutes with the patient including greater than 50% of time in face to face counsel of the patient the need for COVID-19 vaccination to protect both her own health which is in greater jeopardy due to her pregnant status and that of her unborn child going over data regarding vaccines and risk of COVID-19 infection, also and going over to the data surrounding the [place] study and neural tube defects in selection of her antiretroviral regimen and in coordination of her care. coordination of their care.

## 2019-11-25 NOTE — Telephone Encounter (Signed)
metFORMIN (GLUCOPHAGE) 500 MG tablet [facility] [place], [place] [address] Phone:  [phone]  Fax:  [phone]     Patient requesting a refill

## 2019-11-25 NOTE — Telephone Encounter (Signed)
Attempted to call patient to make an appointment, LVM to schedule

## 2019-11-25 NOTE — Telephone Encounter (Signed)
Refill has been denied. Pt is overdue for appt w/Laura. She has given her a 30 day supply and informed her she need ov for any refills. Pls scheduled appt for medication review w/Laura.Marland KitchenJohny Chess

## 2019-11-26 LAB — T-HELPER CELL (CD4) - (RCID CLINIC ONLY)
CD4 % Helper T Cell: 50 % (ref 33–65)
CD4 T Cell Abs: 1199 /uL (ref 400–1790)

## 2019-11-26 LAB — URINE CYTOLOGY ANCILLARY ONLY
Chlamydia: NEGATIVE
Comment: NEGATIVE
Comment: NORMAL
Neisseria Gonorrhea: NEGATIVE

## 2019-11-27 LAB — COMPLETE METABOLIC PANEL WITH GFR
AG Ratio: 1.6 (calc) (ref 1.0–2.5)
ALT: 16 U/L (ref 6–29)
AST: 12 U/L (ref 10–30)
Albumin: 4.2 g/dL (ref 3.6–5.1)
Alkaline phosphatase (APISO): 41 U/L (ref 31–125)
BUN: 7 mg/dL (ref 7–25)
CO2: 27 mmol/L (ref 20–32)
Calcium: 10 mg/dL (ref 8.6–10.2)
Chloride: 102 mmol/L (ref 98–110)
Creat: 0.79 mg/dL (ref 0.50–1.10)
GFR, Est African American: 119 mL/min/{1.73_m2} (ref >=60)
GFR, Est Non African American: 103 mL/min/{1.73_m2} (ref >=60)
Globulin: 2.7 g/dL (calc) (ref 1.9–3.7)
Glucose, Bld: 129 mg/dL — ABNORMAL HIGH (ref 65–99)
Potassium: 4.3 mmol/L (ref 3.5–5.3)
Sodium: 135 mmol/L (ref 135–146)
Total Bilirubin: 0.3 mg/dL (ref 0.2–1.2)
Total Protein: 6.9 g/dL (ref 6.1–8.1)

## 2019-11-27 LAB — CBC WITH DIFFERENTIAL/PLATELET
Absolute Monocytes: 683 cells/uL (ref 200–950)
Basophils Absolute: 40 cells/uL (ref 0–200)
Basophils Relative: 0.3 %
Eosinophils Absolute: 67 cells/uL (ref 15–500)
Eosinophils Relative: 0.5 %
HCT: 36.4 % (ref 35.0–45.0)
Hemoglobin: 12.4 g/dL (ref 11.7–15.5)
Lymphs Abs: 2734 cells/uL (ref 850–3900)
MCH: 29.2 pg (ref 27.0–33.0)
MCHC: 34.1 g/dL (ref 32.0–36.0)
MCV: 85.6 fL (ref 80.0–100.0)
MPV: 9.6 fL (ref 7.5–12.5)
Monocytes Relative: 5.1 %
Neutro Abs: 9876 cells/uL — ABNORMAL HIGH (ref 1500–7800)
Neutrophils Relative %: 73.7 %
Platelets: 422 10*3/uL — ABNORMAL HIGH (ref 140–400)
RBC: 4.25 10*6/uL (ref 3.80–5.10)
RDW: 15.2 % — ABNORMAL HIGH (ref 11.0–15.0)
Total Lymphocyte: 20.4 %
WBC: 13.4 10*3/uL — ABNORMAL HIGH (ref 3.8–10.8)

## 2019-11-27 LAB — HIV-1 RNA QUANT-NO REFLEX-BLD
HIV 1 RNA Quant: <20 Copies/mL — ABNORMAL HIGH
HIV-1 RNA Quant, Log: <1.3 Log cps/mL — ABNORMAL HIGH

## 2019-11-27 LAB — RPR: RPR Ser Ql: NONREACTIVE

## 2019-11-27 NOTE — Telephone Encounter (Signed)
Since she is pregnant, her OB needs to be watching her blood sugar and medications during pregnancy. She can cancel with me for Friday; we will not be refilling Metformin for her.

## 2019-11-28 NOTE — Telephone Encounter (Signed)
Since she is pregnant, her OB needs to be watching her blood sugar and medications during pregnancy. She can cancel with me for Friday; we will not be refilling Metformin for her.

## 2019-11-29 ENCOUNTER — Telehealth: Payer: Self-pay | Admitting: Family

## 2019-11-29 ENCOUNTER — Ambulatory Visit: Payer: 59 | Admitting: Family

## 2019-11-29 NOTE — Telephone Encounter (Signed)
error 

## 2019-11-29 NOTE — Telephone Encounter (Signed)
Patient called and said that she was confused about the voicemail that was left in regards to her appointment for 11/29/2019 and was requesting a call back.   Phone [phone].

## 2019-12-06 ENCOUNTER — Other Ambulatory Visit: Payer: Self-pay

## 2019-12-06 ENCOUNTER — Encounter: Payer: Self-pay | Admitting: Family

## 2019-12-06 ENCOUNTER — Ambulatory Visit (INDEPENDENT_AMBULATORY_CARE_PROVIDER_SITE_OTHER): Payer: 59 | Admitting: Family

## 2019-12-06 VITALS — BP 126/78 | HR 95 | Temp 98.4°F | Ht 63.5 in | Wt 242.0 lb

## 2019-12-06 DIAGNOSIS — R7303 Prediabetes: Secondary | ICD-10-CM | POA: Diagnosis not present

## 2019-12-06 DIAGNOSIS — B20 Human immunodeficiency virus [HIV] disease: Secondary | ICD-10-CM | POA: Diagnosis not present

## 2019-12-06 LAB — POCT GLYCOSYLATED HEMOGLOBIN (HGB A1C): Hemoglobin A1C: 6.3 % — AB (ref 4.0–5.6)

## 2019-12-06 MED ORDER — METFORMIN HCL 500 MG PO TABS: ORAL_TABLET | ORAL | 1 refills | Status: DC

## 2019-12-06 NOTE — Progress Notes (Signed)
Kelly Hunter is a 29 y.o. female with the following history as recorded in EpicCare:  Patient Active Problem List   Diagnosis Date Noted  . HIV (human immunodeficiency virus) risk factors complicating pregnancy 47/42/5956  . Moderate episode of recurrent major depressive disorder ([place]) 10/24/2018  . Grief 12/25/2017  . Elevated blood sugar 12/25/2017  . Sepsis ([place]) 03/08/2017  . Breast abscess 03/08/2017  . Genital herpes 04/27/2016  . Type 2 diabetes mellitus without complication, without long-term current use of insulin ([place]) 11/17/2014  . Hidradenitis axillaris 05/21/2014  . HIV disease ([place]) 01/15/2014  . Asymptomatic HIV infection ([place]) 12/30/2013  . Eczema 10/22/2013  . Rhinitis, allergic 10/22/2013  . Condyloma acuminatum of vulva 06/18/2013  . VIN I (vulvar intraepithelial neoplasia I) 06/18/2013  . Dysplasia of cervix, low grade (CIN 1) 06/18/2013  . ASCUS with positive high risk HPV 06/04/2013  . Obesity 03/30/2006  . ASTHMA, EXERCISE INDUCED 03/30/2006  . ACNE 03/30/2006    Current Outpatient Medications  Medication Sig Dispense Refill  . dolutegravir (TIVICAY) 50 MG tablet Take 1 tablet (50 mg total) by mouth daily. 30 tablet 11  . emtricitabine-tenofovir (TRUVADA) 200-300 MG tablet Take 1 tablet by mouth daily. 30 tablet 5  . metFORMIN (GLUCOPHAGE) 500 MG tablet TAKE 1 TABLET(500 MG) BY MOUTH TWICE DAILY WITH A MEAL 60 tablet 1  . Prenatal Vit-Fe Fumarate-FA (PRENATAL MULTIVITAMIN) TABS tablet Take 1 tablet by mouth daily at 12 noon.    . valACYclovir (VALTREX) 1000 MG tablet TAKE 1 TABLET BY MOUTH ONCE A DAY 30 tablet 1  . Triamcinolone Acetonide (TRIAMCINOLONE 0.1 % CREAM : EUCERIN) CREA Apply 1 application topically daily. As needed. (Patient not taking: Reported on 12/06/2019) 1 each 0   No current facility-administered medications for this visit.    Allergies: Other, Peanut oil, Peanut-containing drug products, Pyridium [phenazopyridine hcl], and Bactrim  [sulfamethoxazole-trimethoprim]  Past Medical History:  Diagnosis Date  . Acne   . Allergy    Allegra  . Asthma    mild; onset in childhood.  No hospitalizations.  . ASTHMA, EXERCISE INDUCED   . Cervical high risk HPV (human papillomavirus) test positive 07/2019   cervical cancer screening due in December, 2021  . Chlamydia 03/03/2012  . CIN I (cervical intraepithelial neoplasia I) 08/31/2013, 05/01/17   colposcopy by Toney Rakes; s/p CO2 laser treatment. repeat colposcopy by Dr. Quincy Simmonds.  . Eczema   . Elevated blood sugar 12/25/2017  . Genital warts 08/31/2013   Aldara treatment; Toney Rakes.  . Grieving 12/25/2017  . Herpes   . Hidradenitis   . Hidradenitis axillaris 05/21/2014  . HIV (human immunodeficiency virus infection) ([place])   . HIV (human immunodeficiency virus) risk factors complicating pregnancy 38/75/6433  . HSV-2 seropositive   . Migraine headache without aura   . Moderate episode of recurrent major depressive disorder ([place]) 10/24/2018  . Substance abuse ([facility])   . Type 2 diabetes mellitus without complication, without long-term current use of insulin ([place]) 11/17/2014  . VIN I (vulvar intraepithelial neoplasia I) 08/31/2013   s/p CO2 treatment; Fernandez/gyn.    Past Surgical History:  Procedure Laterality Date  . BREAST SURGERY  02/01/2008   Reduction  . CO2 LASER APPLICATION N/A 2/95/1884   Procedure: C02 laser of vagina and cervix;  Surgeon: Terrance Mass, MD;  Location: [facility];  Service: Gynecology;  Laterality: N/A;  . INCISION AND DRAINAGE ABSCESS Left 03/09/2017   Procedure: INCISION AND DRAINAGE ABSCESS LEFT BREAST ABSCESS;  Surgeon: Erroll Luna, MD;  Location: [facility];  Service: General;  Laterality: Left;    Family History  Problem Relation Age of Onset  . Diabetes Mother   . Hypertension Mother   . Hyperlipidemia Mother   . Mental illness Mother   . Pulmonary embolism Mother   . Cancer Maternal Grandfather        ?  Marland Kitchen Cancer Paternal Aunt        Cervical or  Ovarian per patient    Social History   Tobacco Use  . Smoking status: Never Smoker  . Smokeless tobacco: Never Used  Substance Use Topics  . Alcohol use: Yes    Alcohol/week: 0.0 standard drinks    Comment: socially    Subjective:  Patient is [redacted] weeks pregnant; has seen OB to establish care as of 11/21/19 and is excited about the baby; has met with ID and medication have been changed- tolerating well; has not been seen here since November 2020- needs refills on her Metformin; does not check her blood sugar regularly; has actually been off the Metformin for at least one month;     Objective:  Vitals:   12/06/19 0943  BP: 126/78  Pulse: 95  Temp: 98.4 F (36.9 C)  TempSrc: Oral  SpO2: 98%  Weight: 242 lb (109.8 kg)  Height: 5' 3.5" (1.613 m)    General: Well developed, well nourished, in no acute distress  Head: Normocephalic and atraumatic  Lungs: Respirations unlabored; clear to auscultation bilaterally without wheeze, rales, rhonchi  CVS exam: normal rate and regular rhythm.  Neurologic: Alert and oriented; speech intact; face symmetrical; moves all extremities well; CNII-XII intact without focal deficit   Assessment:  1. Pre-diabetes     Plan:  Hgba1c in office today is 6.3; refill is updated; she will discuss management of her blood sugar with her OB at next OV there in 2 weeks;  Continue with ID as scheduled for pregnancy management as well;  Patient defers COVID vaccine at this time; I did ask her to ask her fiance to get his vaccine and she agrees.  This visit occurred during the SARS-CoV-2 public health emergency.  Safety protocols were in place, including screening questions prior to the visit, additional usage of staff PPE, and extensive cleaning of exam room while observing appropriate contact time as indicated for disinfecting solutions.     No follow-ups on file.  Orders Placed This Encounter  Procedures  . POCT glycosylated hemoglobin (Hb A1C)     Requested Prescriptions   Signed Prescriptions Disp Refills  . metFORMIN (GLUCOPHAGE) 500 MG tablet 60 tablet 1    Sig: TAKE 1 TABLET(500 MG) BY MOUTH TWICE DAILY WITH A MEAL

## 2019-12-24 ENCOUNTER — Other Ambulatory Visit: Payer: Self-pay

## 2019-12-24 DIAGNOSIS — A6000 Herpesviral infection of urogenital system, unspecified: Secondary | ICD-10-CM

## 2019-12-24 MED ORDER — VALACYCLOVIR HCL 1 G PO TABS: 1000.0000 mg | ORAL_TABLET | Freq: Every day | ORAL | 4 refills | Status: DC

## 2019-12-24 MED FILL — valACYclovir HCL 1 GM TABS: 1 | 30 days supply | Qty: 30 | Fill #0

## 2019-12-24 MED FILL — EMTRICITABINE-TENOFOVIR DF: 200-300 | 30 days supply | Qty: 30 | Fill #2

## 2019-12-24 MED FILL — TIVICAY 50 MG TABLET: 50 | 30 days supply | Qty: 30 | Fill #1

## 2020-01-02 ENCOUNTER — Telehealth: Payer: Self-pay

## 2020-01-02 NOTE — Telephone Encounter (Signed)
Patient called to cancel her appointment for repeat pap on 01/06/20. Patient is pregnant and due 06/24/20. She is scheduled 07/07/20 with Dr. Quincy Simmonds for annual. Patient states she is still having an issue with morning sickness. She is going to follow up with her OB for that issue.

## 2020-01-02 NOTE — Telephone Encounter (Signed)
Call placed to pt. Left detailed message per DPR. Pt made aware of cancelled AEX with Dr Quincy Simmonds on 07/07/20 due to not being 4-6 weeks postpartum and will have to call back to reschedule AEX. Will place pt in recall for repeat pap after delivery at AEX.  Pt to return call with any questions or concerns and to make next AEX appt in July 2022. Encounter closed

## 2020-01-06 ENCOUNTER — Ambulatory Visit: Payer: Self-pay | Admitting: Obstetrics and Gynecology

## 2020-01-08 ENCOUNTER — Other Ambulatory Visit: Payer: 59

## 2020-01-08 ENCOUNTER — Other Ambulatory Visit: Payer: Self-pay

## 2020-01-08 DIAGNOSIS — B20 Human immunodeficiency virus [HIV] disease: Secondary | ICD-10-CM

## 2020-01-08 DIAGNOSIS — O09891 Supervision of other high risk pregnancies, first trimester: Secondary | ICD-10-CM

## 2020-01-09 LAB — T-HELPER CELL (CD4) - (RCID CLINIC ONLY)
CD4 % Helper T Cell: 49 % (ref 33–65)
CD4 T Cell Abs: 977 /uL (ref 400–1790)

## 2020-01-10 ENCOUNTER — Other Ambulatory Visit: Payer: Self-pay | Admitting: Obstetrics and Gynecology

## 2020-01-12 LAB — CBC WITH DIFFERENTIAL/PLATELET
Absolute Monocytes: 624 cells/uL (ref 200–950)
Basophils Absolute: 42 cells/uL (ref 0–200)
Basophils Relative: 0.4 %
Eosinophils Absolute: 187 cells/uL (ref 15–500)
Eosinophils Relative: 1.8 %
HCT: 32 % — ABNORMAL LOW (ref 35.0–45.0)
Hemoglobin: 11 g/dL — ABNORMAL LOW (ref 11.7–15.5)
Lymphs Abs: 2246 cells/uL (ref 850–3900)
MCH: 29.4 pg (ref 27.0–33.0)
MCHC: 34.4 g/dL (ref 32.0–36.0)
MCV: 85.6 fL (ref 80.0–100.0)
MPV: 9.6 fL (ref 7.5–12.5)
Monocytes Relative: 6 %
Neutro Abs: 7301 cells/uL (ref 1500–7800)
Neutrophils Relative %: 70.2 %
Platelets: 392 10*3/uL (ref 140–400)
RBC: 3.74 10*6/uL — ABNORMAL LOW (ref 3.80–5.10)
RDW: 15 % (ref 11.0–15.0)
Total Lymphocyte: 21.6 %
WBC: 10.4 10*3/uL (ref 3.8–10.8)

## 2020-01-12 LAB — COMPLETE METABOLIC PANEL WITH GFR
AG Ratio: 1.4 (calc) (ref 1.0–2.5)
ALT: 13 U/L (ref 6–29)
AST: 10 U/L (ref 10–30)
Albumin: 3.7 g/dL (ref 3.6–5.1)
Alkaline phosphatase (APISO): 37 U/L (ref 31–125)
BUN/Creatinine Ratio: 8 (calc) (ref 6–22)
BUN: 6 mg/dL — ABNORMAL LOW (ref 7–25)
CO2: 25 mmol/L (ref 20–32)
Calcium: 9 mg/dL (ref 8.6–10.2)
Chloride: 105 mmol/L (ref 98–110)
Creat: 0.74 mg/dL (ref 0.50–1.10)
GFR, Est African American: 129 mL/min/{1.73_m2} (ref >=60)
GFR, Est Non African American: 111 mL/min/{1.73_m2} (ref >=60)
Globulin: 2.6 g/dL (calc) (ref 1.9–3.7)
Glucose, Bld: 107 mg/dL — ABNORMAL HIGH (ref 65–99)
Potassium: 4.5 mmol/L (ref 3.5–5.3)
Sodium: 135 mmol/L (ref 135–146)
Total Bilirubin: 0.2 mg/dL (ref 0.2–1.2)
Total Protein: 6.3 g/dL (ref 6.1–8.1)

## 2020-01-12 LAB — HIV-1 RNA QUANT-NO REFLEX-BLD
HIV 1 RNA Quant: <20 Copies/mL — ABNORMAL HIGH
HIV-1 RNA Quant, Log: <1.3 Log cps/mL — ABNORMAL HIGH

## 2020-01-12 LAB — RPR: RPR Ser Ql: NONREACTIVE

## 2020-01-13 ENCOUNTER — Other Ambulatory Visit: Payer: Self-pay | Admitting: Obstetrics and Gynecology

## 2020-01-13 DIAGNOSIS — Z363 Encounter for antenatal screening for malformations: Secondary | ICD-10-CM

## 2020-01-13 DIAGNOSIS — O24419 Gestational diabetes mellitus in pregnancy, unspecified control: Secondary | ICD-10-CM

## 2020-01-13 DIAGNOSIS — O99212 Obesity complicating pregnancy, second trimester: Secondary | ICD-10-CM

## 2020-01-13 DIAGNOSIS — Z3A20 20 weeks gestation of pregnancy: Secondary | ICD-10-CM

## 2020-01-15 ENCOUNTER — Encounter: Payer: 59 | Attending: Obstetrics and Gynecology | Admitting: Registered"

## 2020-01-15 ENCOUNTER — Other Ambulatory Visit: Payer: Self-pay

## 2020-01-15 ENCOUNTER — Encounter: Payer: Self-pay | Admitting: Registered"

## 2020-01-15 DIAGNOSIS — O24119 Pre-existing diabetes mellitus, type 2, in pregnancy, unspecified trimester: Secondary | ICD-10-CM | POA: Insufficient documentation

## 2020-01-15 NOTE — Progress Notes (Signed)
Patient was seen on 01/15/20 for Gestational Diabetes self-management class at [facility]. The following learning objectives were met by the patient during this course:   States the definition of Gestational Diabetes  States why dietary management is important in controlling blood glucose  Describes the effects each nutrient has on blood glucose levels  Demonstrates ability to create a balanced meal plan  Demonstrates carbohydrate counting   States when to check blood glucose levels  Demonstrates proper blood glucose monitoring techniques  States the effect of stress and exercise on blood glucose levels  States the importance of limiting caffeine and abstaining from alcohol and smoking  Blood glucose monitor given: [employer] Reflect Lot# [id number] X Exp: 11/30/2020 Blood Glucose: 130 mg/dL (pt states she is fasting)  Patient instructed to monitor glucose levels: FBS: 60 - <95; 1 hour: <140; 2 hour: <120  Patient received handouts:  Nutrition Diabetes and Pregnancy, including carb counting list  Patient will be seen for follow-up Feb 07, 2020.

## 2020-01-21 MED FILL — EMTRICITABINE-TENOFOVIR DF: 200-300 | 30 days supply | Qty: 30 | Fill #3

## 2020-01-21 MED FILL — TIVICAY 50 MG TABLET: 50 | 30 days supply | Qty: 30 | Fill #2

## 2020-01-21 MED FILL — valACYclovir HCL 1 GM TABS: 1 | 30 days supply | Qty: 30 | Fill #1

## 2020-01-22 ENCOUNTER — Other Ambulatory Visit: Payer: Self-pay

## 2020-01-22 ENCOUNTER — Ambulatory Visit (INDEPENDENT_AMBULATORY_CARE_PROVIDER_SITE_OTHER): Payer: 59 | Admitting: Infectious Disease

## 2020-01-22 ENCOUNTER — Encounter: Payer: Self-pay | Admitting: Infectious Disease

## 2020-01-22 VITALS — BP 149/81 | HR 92 | Temp 98.3°F | Ht 63.0 in | Wt 241.0 lb

## 2020-01-22 DIAGNOSIS — B20 Human immunodeficiency virus [HIV] disease: Secondary | ICD-10-CM

## 2020-01-22 DIAGNOSIS — O09891 Supervision of other high risk pregnancies, first trimester: Secondary | ICD-10-CM

## 2020-01-22 DIAGNOSIS — Z7185 Encounter for immunization safety counseling: Secondary | ICD-10-CM | POA: Insufficient documentation

## 2020-01-22 DIAGNOSIS — A6000 Herpesviral infection of urogenital system, unspecified: Secondary | ICD-10-CM | POA: Diagnosis not present

## 2020-01-22 DIAGNOSIS — N870 Mild cervical dysplasia: Secondary | ICD-10-CM | POA: Diagnosis not present

## 2020-01-22 HISTORY — DX: Encounter for immunization safety counseling: Z71.85

## 2020-01-22 NOTE — Progress Notes (Signed)
Subjective:  Chief complaint: follup for HIV in pregnant woman   Patient ID: Kelly Hunter, female    DOB: November 25, 1990, 29 y.o.   MRN: [id number]  HPI  29year-old African-American lady with HIV disease with perfect virological suppression on TRIUMEQ --> Biktarvy  Since she has become pregnant and was in the first few weeks of her pregnancy was seen by my partner Dr. Elinor Parkinson, and changed to twice a day Isentress plus once daily Truvada.  She  Tolerated these  medications well but did    She did not like taking multiple pills multiple times per day.  I reviewed with her the data from [place] and current updated DHHS guidelines which include TIVICAY as a preferred regimen throughout pregnancy and including for women he might become pregnant.  I offered options of TIVICAY plus Truvada versus TIVICAY plus DESCOVY.  We ended up going for TIVICAY and Truvada  She has remained perfectly suppressed on this regimen as well.  This is her firs pregnancy and she is in 2nd trimester.  She has been vaccinated vs flu but not COVID which poses particular risk to her while she is pregnant. Apparently her husband is against her being vaccinated.  Past Medical History:  Diagnosis Date   Acne    Allergy    Allegra   Asthma    mild; onset in childhood.  No hospitalizations.   ASTHMA, EXERCISE INDUCED    Cervical high risk HPV (human papillomavirus) test positive 07/2019   cervical cancer screening due in December, 2021   Chlamydia 03/03/2012   CIN I (cervical intraepithelial neoplasia I) 08/31/2013, 05/01/17   colposcopy by Lily Peer; s/p CO2 laser treatment. repeat colposcopy by Dr. Edward Jolly.   Eczema    Elevated blood sugar 12/25/2017   Genital warts 08/31/2013   Aldara treatment; Lily Peer.   Grieving 12/25/2017   Herpes    Hidradenitis    Hidradenitis axillaris 05/21/2014   HIV (human immunodeficiency virus infection) (HCC)    HIV (human immunodeficiency virus) risk  factors complicating pregnancy 11/25/2019   HSV-2 seropositive    Migraine headache without aura    Moderate episode of recurrent major depressive disorder (HCC) 10/24/2018   Substance abuse (HCC)    Type 2 diabetes mellitus without complication, without long-term current use of insulin (HCC) 11/17/2014   VIN I (vulvar intraepithelial neoplasia I) 08/31/2013   s/p CO2 treatment; Fernandez/gyn.    Past Surgical History:  Procedure Laterality Date   BREAST SURGERY  02/01/2008   Reduction   CO2 LASER APPLICATION N/A 07/18/2013   Procedure: C02 laser of vagina and cervix;  Surgeon: Ok Edwards, MD;  Location: [facility];  Service: Gynecology;  Laterality: N/A;   INCISION AND DRAINAGE ABSCESS Left 03/09/2017   Procedure: INCISION AND DRAINAGE ABSCESS LEFT BREAST ABSCESS;  Surgeon: Harriette Bouillon, MD;  Location: [facility];  Service: General;  Laterality: Left;    Family History  Problem Relation Age of Onset   Diabetes Mother    Hypertension Mother    Hyperlipidemia Mother    Mental illness Mother    Pulmonary embolism Mother    Cancer Maternal Grandfather        ?   Cancer Paternal Aunt        Cervical or Ovarian per patient      Social History   Socioeconomic History   Marital status: Single    Spouse name: n/a   Number of children: 0   Years of education: Not on file  Highest education level: Not on file  Occupational History   Occupation: Freight forwarder    Comment: [employer]  Tobacco Use   Smoking status: Never Smoker   Smokeless tobacco: Never Used  Vaping Use   Vaping Use: Never used  Substance and Sexual Activity   Alcohol use: Yes    Alcohol/week: 0.0 standard drinks    Comment: socially   Drug use: No    Types: Marijuana   Sexual activity: Yes    Partners: Male    Birth control/protection: Condom    Comment: condoms everytime  Other Topics Concern   Not on file  Social History Narrative   Marital status: single;  Not dating in 2016.       Children: none; never pregnant.      Lives: with mom; dad in [place].     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      Education: previously in school; took medical leave in 02/2012; [employer] in [place].      Tobacco: vapor cigarette two months. Pt does not smoke.       Alcohol:  Socially; weekends; no DUIs.      Drugs: marijuana daily.  Started at age 28.        Sexual activity: sexually active; 25 total partners; males; Chlamydia in 03/2012.  Genital warts 08/2013.  HIV diagnosis 12/2013.      Seatbelt: 100%      Guns:  none   Social Determinants of Radio broadcast assistant Strain: Not on file  Food Insecurity: No Food Insecurity   Worried About Charity fundraiser in the Last Year: Never true   Arboriculturist in the Last Year: Never true  Transportation Needs: Not on file  Physical Activity: Not on file  Stress: Not on file  Social Connections: Not on file    Allergies  Allergen Reactions   Other Nausea And Vomiting and Swelling    SWELLING REACTION UNSPECIFIED  Tree Nuts (Almonds)   Peanut Oil Nausea And Vomiting and Swelling    SWELLING REACTION UNSPECIFIED    Peanut-Containing Drug Products Nausea And Vomiting and Swelling    SWELLING REACTION UNSPECIFIED    Pyridium [Phenazopyridine Hcl] Nausea And Vomiting   Bactrim [Sulfamethoxazole-Trimethoprim] Rash     Current Outpatient Medications:    dolutegravir (TIVICAY) 50 MG tablet, Take 1 tablet (50 mg total) by mouth daily., Disp: 30 tablet, Rfl: 11   emtricitabine-tenofovir (TRUVADA) 200-300 MG tablet, Take 1 tablet by mouth daily., Disp: 30 tablet, Rfl: 5   metFORMIN (GLUCOPHAGE) 500 MG tablet, TAKE 1 TABLET(500 MG) BY MOUTH TWICE DAILY WITH A MEAL, Disp: 60 tablet, Rfl: 1   Prenatal Vit-Fe Fumarate-FA (PRENATAL MULTIVITAMIN) TABS tablet, Take 1 tablet by mouth daily at 12 noon., Disp: , Rfl:    Triamcinolone Acetonide (TRIAMCINOLONE 0.1 % CREAM : EUCERIN) CREA,  Apply 1 application topically daily. As needed. (Patient not taking: Reported on 12/06/2019), Disp: 1 each, Rfl: 0   valACYclovir (VALTREX) 1000 MG tablet, Take 1 tablet (1,000 mg total) by mouth daily., Disp: 30 tablet, Rfl: 4   Review of Systems  Constitutional: Negative for activity change, appetite change, chills, diaphoresis, fatigue, fever and unexpected weight change.  HENT: Negative for congestion, rhinorrhea, sinus pressure, sneezing, sore throat and trouble swallowing.   Eyes: Negative for photophobia and visual disturbance.  Respiratory: Negative for cough, chest tightness, shortness of breath, wheezing and stridor.   Cardiovascular: Negative  for chest pain, palpitations and leg swelling.  Gastrointestinal: Negative for abdominal distention, abdominal pain, anal bleeding, blood in stool, constipation, diarrhea, nausea and vomiting.  Genitourinary: Negative for difficulty urinating, dysuria, flank pain and hematuria.  Musculoskeletal: Negative for arthralgias, back pain, gait problem, joint swelling and myalgias.  Skin: Negative for color change, pallor, rash and wound.  Neurological: Negative for dizziness, tremors, weakness and light-headedness.  Hematological: Negative for adenopathy. Does not bruise/bleed easily.  Psychiatric/Behavioral: Negative for agitation, behavioral problems, confusion, decreased concentration, dysphoric mood and sleep disturbance.       Objective:   Physical Exam Constitutional:      General: She is not in acute distress.    Appearance: She is not diaphoretic.  HENT:     Head: Normocephalic and atraumatic.     Right Ear: External ear normal.     Left Ear: External ear normal.     Nose: Nose normal.     Mouth/Throat:     Mouth: Oropharynx is clear and moist.     Pharynx: No oropharyngeal exudate.  Eyes:     General: No scleral icterus.    Extraocular Movements: Extraocular movements intact and EOM normal.     Conjunctiva/sclera: Conjunctivae  normal.  Cardiovascular:     Rate and Rhythm: Normal rate and regular rhythm.     Heart sounds: Normal heart sounds.  Pulmonary:     Effort: Pulmonary effort is normal. No respiratory distress.     Breath sounds: Normal breath sounds. No wheezing.  Abdominal:     General: Bowel sounds are normal. There is no distension.     Palpations: Abdomen is soft.  Musculoskeletal:        General: No tenderness or edema. Normal range of motion.     Cervical back: Normal range of motion and neck supple.  Lymphadenopathy:     Cervical: No cervical adenopathy.  Skin:    General: Skin is warm and dry.     Coloration: Skin is not pale.     Findings: No erythema or rash.  Neurological:     Mental Status: She is alert and oriented to person, place, and time.     Coordination: Coordination normal.  Psychiatric:        Mood and Affect: Mood normal.        Behavior: Behavior normal.        Thought Content: Thought content normal.        Judgment: Judgment normal.           Assessment & Plan:   HIV in a woman who is now pregnant. Continue Tivicay and Truvada.  Note if she continues to maintain her usual level of high adherence she should NOT need any IV AZT during delivery  HSV: on valtrex. Hopefully not active as she nears delivery. Ob aware of this  Ascus; need to followup with Ob/gyn re this later  Prevention: I am really disappointed in her husband if his influence is leading to her not being vaccinated as this puts her and her unborn child at higher risk should she become infected which is becoming a near certainty in coming months despite vaccines, and meassures.  I spent greater than 40 minutes with the patient including greater than 50% of time in face to face counsel of the patient re vaccines, ARV regimens  and in coordination of her care.

## 2020-02-01 ENCOUNTER — Other Ambulatory Visit: Payer: Self-pay

## 2020-02-01 ENCOUNTER — Encounter (HOSPITAL_COMMUNITY): Payer: Self-pay | Admitting: Obstetrics and Gynecology

## 2020-02-01 ENCOUNTER — Inpatient Hospital Stay (HOSPITAL_COMMUNITY)
Admission: AD | Admit: 2020-02-01 | Discharge: 2020-02-01 | Disposition: A | Discharge status: Home / Self Care | Payer: 59 | Attending: Obstetrics and Gynecology | Admitting: Obstetrics and Gynecology

## 2020-02-01 DIAGNOSIS — Z3A19 19 weeks gestation of pregnancy: Secondary | ICD-10-CM

## 2020-02-01 DIAGNOSIS — O99891 Other specified diseases and conditions complicating pregnancy: Secondary | ICD-10-CM

## 2020-02-01 DIAGNOSIS — Z21 Asymptomatic human immunodeficiency virus [HIV] infection status: Secondary | ICD-10-CM | POA: Diagnosis not present

## 2020-02-01 DIAGNOSIS — O24415 Gestational diabetes mellitus in pregnancy, controlled by oral hypoglycemic drugs: Secondary | ICD-10-CM

## 2020-02-01 DIAGNOSIS — O2692 Pregnancy related conditions, unspecified, second trimester: Secondary | ICD-10-CM | POA: Insufficient documentation

## 2020-02-01 DIAGNOSIS — Z041 Encounter for examination and observation following transport accident: Secondary | ICD-10-CM | POA: Insufficient documentation

## 2020-02-01 DIAGNOSIS — O98712 Human immunodeficiency virus [HIV] disease complicating pregnancy, second trimester: Secondary | ICD-10-CM | POA: Diagnosis not present
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
## 2020-02-07 ENCOUNTER — Ambulatory Visit: Payer: 59 | Admitting: Registered"

## 2020-02-10 ENCOUNTER — Other Ambulatory Visit: Payer: Self-pay

## 2020-02-10 ENCOUNTER — Ambulatory Visit: Payer: 59 | Admitting: *Deleted

## 2020-02-10 ENCOUNTER — Ambulatory Visit: Payer: 59 | Attending: Obstetrics and Gynecology

## 2020-02-10 ENCOUNTER — Other Ambulatory Visit: Payer: Self-pay | Admitting: *Deleted

## 2020-02-10 ENCOUNTER — Encounter: Payer: Self-pay | Admitting: *Deleted

## 2020-02-10 VITALS — BP 116/72 | HR 97

## 2020-02-10 DIAGNOSIS — Z3A20 20 weeks gestation of pregnancy: Secondary | ICD-10-CM | POA: Diagnosis present

## 2020-02-10 DIAGNOSIS — O24419 Gestational diabetes mellitus in pregnancy, unspecified control: Secondary | ICD-10-CM | POA: Diagnosis present

## 2020-02-10 DIAGNOSIS — Z363 Encounter for antenatal screening for malformations: Secondary | ICD-10-CM | POA: Diagnosis present

## 2020-02-10 DIAGNOSIS — O24415 Gestational diabetes mellitus in pregnancy, controlled by oral hypoglycemic drugs: Secondary | ICD-10-CM

## 2020-02-10 DIAGNOSIS — O99212 Obesity complicating pregnancy, second trimester: Secondary | ICD-10-CM | POA: Diagnosis present

## 2020-02-23 ENCOUNTER — Other Ambulatory Visit: Payer: Self-pay | Admitting: Family

## 2020-02-24 MED FILL — EMTRICITABINE-TENOFOVIR DF: 200-300 | 30 days supply | Qty: 30 | Fill #4

## 2020-02-24 MED FILL — valACYclovir HCL 1 GM TABS: 1 | 30 days supply | Qty: 30 | Fill #2

## 2020-02-24 MED FILL — TIVICAY 50 MG TABLET: 50 | 30 days supply | Qty: 30 | Fill #3

## 2020-03-09 ENCOUNTER — Ambulatory Visit: Payer: 59 | Attending: Obstetrics and Gynecology

## 2020-03-09 ENCOUNTER — Ambulatory Visit: Payer: 59 | Admitting: *Deleted

## 2020-03-09 ENCOUNTER — Other Ambulatory Visit: Payer: Self-pay

## 2020-03-09 ENCOUNTER — Encounter: Payer: Self-pay | Admitting: *Deleted

## 2020-03-09 VITALS — BP 121/68 | HR 102

## 2020-03-09 DIAGNOSIS — O99212 Obesity complicating pregnancy, second trimester: Secondary | ICD-10-CM

## 2020-03-09 DIAGNOSIS — O98312 Other infections with a predominantly sexual mode of transmission complicating pregnancy, second trimester: Secondary | ICD-10-CM | POA: Diagnosis not present

## 2020-03-09 DIAGNOSIS — O9A212 Injury, poisoning and certain other consequences of external causes complicating pregnancy, second trimester: Secondary | ICD-10-CM

## 2020-03-09 DIAGNOSIS — O24415 Gestational diabetes mellitus in pregnancy, controlled by oral hypoglycemic drugs: Secondary | ICD-10-CM | POA: Insufficient documentation

## 2020-03-09 DIAGNOSIS — Z3A24 24 weeks gestation of pregnancy: Secondary | ICD-10-CM

## 2020-03-09 DIAGNOSIS — O98712 Human immunodeficiency virus [HIV] disease complicating pregnancy, second trimester: Secondary | ICD-10-CM

## 2020-03-09 DIAGNOSIS — E669 Obesity, unspecified: Secondary | ICD-10-CM

## 2020-03-09 DIAGNOSIS — Z21 Asymptomatic human immunodeficiency virus [HIV] infection status: Secondary | ICD-10-CM | POA: Diagnosis not present

## 2020-03-09 DIAGNOSIS — T1490XA Injury, unspecified, initial encounter: Secondary | ICD-10-CM

## 2020-03-09 LAB — OB RESULTS CONSOLE RUBELLA ANTIBODY, IGM: Rubella: IMMUNE

## 2020-03-09 LAB — OB RESULTS CONSOLE GC/CHLAMYDIA
Chlamydia: NEGATIVE
Gonorrhea: NEGATIVE

## 2020-03-10 ENCOUNTER — Other Ambulatory Visit: Payer: Self-pay | Admitting: *Deleted

## 2020-03-10 ENCOUNTER — Encounter: Payer: Self-pay | Admitting: Pediatric Cardiology

## 2020-03-10 DIAGNOSIS — O24415 Gestational diabetes mellitus in pregnancy, controlled by oral hypoglycemic drugs: Secondary | ICD-10-CM

## 2020-03-24 MED FILL — TIVICAY 50 MG TABLET: 50 | 30 days supply | Qty: 30 | Fill #4

## 2020-03-24 MED FILL — EMTRICITABINE-TENOFOVIR DF: 200-300 | 30 days supply | Qty: 30 | Fill #5

## 2020-03-30 ENCOUNTER — Ambulatory Visit: Payer: 59 | Attending: Obstetrics

## 2020-03-30 ENCOUNTER — Other Ambulatory Visit: Payer: Self-pay

## 2020-03-30 ENCOUNTER — Ambulatory Visit: Payer: 59 | Admitting: *Deleted

## 2020-03-30 ENCOUNTER — Encounter: Payer: Self-pay | Admitting: *Deleted

## 2020-03-30 VITALS — BP 132/70 | HR 93

## 2020-03-30 DIAGNOSIS — O99212 Obesity complicating pregnancy, second trimester: Secondary | ICD-10-CM

## 2020-03-30 DIAGNOSIS — O365920 Maternal care for other known or suspected poor fetal growth, second trimester, not applicable or unspecified: Secondary | ICD-10-CM

## 2020-03-30 DIAGNOSIS — O24415 Gestational diabetes mellitus in pregnancy, controlled by oral hypoglycemic drugs: Secondary | ICD-10-CM

## 2020-03-30 DIAGNOSIS — E669 Obesity, unspecified: Secondary | ICD-10-CM

## 2020-03-30 DIAGNOSIS — O98712 Human immunodeficiency virus [HIV] disease complicating pregnancy, second trimester: Secondary | ICD-10-CM

## 2020-03-30 DIAGNOSIS — Z3A27 27 weeks gestation of pregnancy: Secondary | ICD-10-CM

## 2020-03-31 ENCOUNTER — Other Ambulatory Visit: Payer: Self-pay | Admitting: Obstetrics

## 2020-03-31 DIAGNOSIS — O24415 Gestational diabetes mellitus in pregnancy, controlled by oral hypoglycemic drugs: Secondary | ICD-10-CM

## 2020-03-31 DIAGNOSIS — O98713 Human immunodeficiency virus [HIV] disease complicating pregnancy, third trimester: Secondary | ICD-10-CM

## 2020-03-31 DIAGNOSIS — O99213 Obesity complicating pregnancy, third trimester: Secondary | ICD-10-CM

## 2020-03-31 DIAGNOSIS — O365931 Maternal care for other known or suspected poor fetal growth, third trimester, fetus 1: Secondary | ICD-10-CM

## 2020-04-02 MED FILL — valACYclovir HCL 1 GM TABS: 1 | 30 days supply | Qty: 30 | Fill #3

## 2020-04-06 ENCOUNTER — Other Ambulatory Visit: Payer: 59

## 2020-04-07 ENCOUNTER — Other Ambulatory Visit: Payer: Self-pay

## 2020-04-07 ENCOUNTER — Ambulatory Visit: Payer: 59 | Attending: Obstetrics

## 2020-04-07 ENCOUNTER — Encounter: Payer: Self-pay | Admitting: *Deleted

## 2020-04-07 ENCOUNTER — Ambulatory Visit: Payer: 59 | Admitting: *Deleted

## 2020-04-07 VITALS — BP 130/88 | HR 102

## 2020-04-07 DIAGNOSIS — O98713 Human immunodeficiency virus [HIV] disease complicating pregnancy, third trimester: Secondary | ICD-10-CM | POA: Insufficient documentation

## 2020-04-07 DIAGNOSIS — O99213 Obesity complicating pregnancy, third trimester: Secondary | ICD-10-CM | POA: Diagnosis present

## 2020-04-07 DIAGNOSIS — Z362 Encounter for other antenatal screening follow-up: Secondary | ICD-10-CM | POA: Diagnosis not present

## 2020-04-07 DIAGNOSIS — E669 Obesity, unspecified: Secondary | ICD-10-CM

## 2020-04-07 DIAGNOSIS — O365930 Maternal care for other known or suspected poor fetal growth, third trimester, not applicable or unspecified: Secondary | ICD-10-CM

## 2020-04-07 DIAGNOSIS — Z21 Asymptomatic human immunodeficiency virus [HIV] infection status: Secondary | ICD-10-CM

## 2020-04-07 DIAGNOSIS — O24415 Gestational diabetes mellitus in pregnancy, controlled by oral hypoglycemic drugs: Secondary | ICD-10-CM

## 2020-04-07 DIAGNOSIS — O365931 Maternal care for other known or suspected poor fetal growth, third trimester, fetus 1: Secondary | ICD-10-CM | POA: Diagnosis present

## 2020-04-07 DIAGNOSIS — Z3A28 28 weeks gestation of pregnancy: Secondary | ICD-10-CM

## 2020-04-07 NOTE — Procedures (Signed)
Kelly Hunter 1990-04-12 [redacted]w[redacted]d  Fetus A Non-Stress Test Interpretation for 04/07/20  Indication: IUGR  Fetal Heart Rate A Mode: External Baseline Rate (A): 150 bpm Variability: Moderate Accelerations: 15 x 15 Decelerations: None Multiple birth?: No  Uterine Activity Mode: Palpation,Toco Contraction Frequency (min): None Resting Tone Palpated: Relaxed Resting Time: Adequate  Interpretation (Fetal Testing) Nonstress Test Interpretation: Reactive Comments: Dr. Annamaria Boots reviewed tracing.

## 2020-04-14 ENCOUNTER — Ambulatory Visit (HOSPITAL_BASED_OUTPATIENT_CLINIC_OR_DEPARTMENT_OTHER): Payer: 59 | Admitting: *Deleted

## 2020-04-14 ENCOUNTER — Ambulatory Visit: Payer: 59 | Attending: Obstetrics

## 2020-04-14 ENCOUNTER — Other Ambulatory Visit: Payer: Self-pay

## 2020-04-14 ENCOUNTER — Ambulatory Visit: Payer: 59 | Admitting: *Deleted

## 2020-04-14 VITALS — BP 119/69 | HR 107

## 2020-04-14 DIAGNOSIS — Z3A29 29 weeks gestation of pregnancy: Secondary | ICD-10-CM | POA: Diagnosis present

## 2020-04-14 DIAGNOSIS — O98713 Human immunodeficiency virus [HIV] disease complicating pregnancy, third trimester: Secondary | ICD-10-CM | POA: Diagnosis present

## 2020-04-14 DIAGNOSIS — O99213 Obesity complicating pregnancy, third trimester: Secondary | ICD-10-CM

## 2020-04-14 DIAGNOSIS — O365931 Maternal care for other known or suspected poor fetal growth, third trimester, fetus 1: Secondary | ICD-10-CM

## 2020-04-14 DIAGNOSIS — O24415 Gestational diabetes mellitus in pregnancy, controlled by oral hypoglycemic drugs: Secondary | ICD-10-CM

## 2020-04-14 DIAGNOSIS — O365930 Maternal care for other known or suspected poor fetal growth, third trimester, not applicable or unspecified: Secondary | ICD-10-CM

## 2020-04-14 DIAGNOSIS — Z3A00 Weeks of gestation of pregnancy not specified: Secondary | ICD-10-CM | POA: Diagnosis not present

## 2020-04-14 DIAGNOSIS — E669 Obesity, unspecified: Secondary | ICD-10-CM

## 2020-04-14 NOTE — Procedures (Signed)
Kelly Hunter 1990-07-05 [redacted]w[redacted]d  Fetus A Non-Stress Test Interpretation for 04/14/20  Indication: IUGR  Fetal Heart Rate A Mode: External Baseline Rate (A): 150 bpm Variability: Moderate Accelerations: 15 x 15 Decelerations: None Multiple birth?: No  Uterine Activity Mode: Palpation,Toco Contraction Frequency (min): None Resting Tone Palpated: Relaxed Resting Time: Adequate  Interpretation (Fetal Testing) Nonstress Test Interpretation: Reactive Comments: Dr. Annamaria Boots reviewed tracing.

## 2020-04-20 ENCOUNTER — Other Ambulatory Visit: Payer: Self-pay

## 2020-04-20 ENCOUNTER — Other Ambulatory Visit: Payer: Self-pay | Admitting: Infectious Diseases

## 2020-04-21 ENCOUNTER — Ambulatory Visit: Payer: 59 | Admitting: *Deleted

## 2020-04-21 ENCOUNTER — Encounter: Payer: Self-pay | Admitting: *Deleted

## 2020-04-21 ENCOUNTER — Ambulatory Visit: Payer: 59 | Attending: Obstetrics

## 2020-04-21 ENCOUNTER — Other Ambulatory Visit: Payer: Self-pay | Admitting: *Deleted

## 2020-04-21 ENCOUNTER — Other Ambulatory Visit: Payer: Self-pay

## 2020-04-21 VITALS — BP 137/81 | HR 100

## 2020-04-21 DIAGNOSIS — Z3A30 30 weeks gestation of pregnancy: Secondary | ICD-10-CM

## 2020-04-21 DIAGNOSIS — O365930 Maternal care for other known or suspected poor fetal growth, third trimester, not applicable or unspecified: Secondary | ICD-10-CM

## 2020-04-21 DIAGNOSIS — O24415 Gestational diabetes mellitus in pregnancy, controlled by oral hypoglycemic drugs: Secondary | ICD-10-CM

## 2020-04-21 DIAGNOSIS — O365931 Maternal care for other known or suspected poor fetal growth, third trimester, fetus 1: Secondary | ICD-10-CM | POA: Insufficient documentation

## 2020-04-21 DIAGNOSIS — O99213 Obesity complicating pregnancy, third trimester: Secondary | ICD-10-CM | POA: Diagnosis present

## 2020-04-21 DIAGNOSIS — O98713 Human immunodeficiency virus [HIV] disease complicating pregnancy, third trimester: Secondary | ICD-10-CM | POA: Diagnosis present

## 2020-04-21 DIAGNOSIS — Z362 Encounter for other antenatal screening follow-up: Secondary | ICD-10-CM

## 2020-04-21 DIAGNOSIS — B20 Human immunodeficiency virus [HIV] disease: Secondary | ICD-10-CM

## 2020-04-21 NOTE — Procedures (Signed)
Kelly Hunter 07-May-1990 [redacted]w[redacted]d  Fetus A Non-Stress Test Interpretation for 04/21/20  Indication: IUGR  Fetal Heart Rate A Mode: External Baseline Rate (A): 140 bpm Variability: Moderate Accelerations: 15 x 15 Decelerations: None Multiple birth?: No  Uterine Activity Mode: Palpation,Toco Contraction Frequency (min): none Resting Tone Palpated: Relaxed Resting Time: Adequate  Interpretation (Fetal Testing) Nonstress Test Interpretation: Reactive Overall Impression: Reassuring for gestational age Comments: Dr. Donalee Citrin reviewed tracing.

## 2020-04-22 MED FILL — EMTRICITABINE-TENOFOVIR DF: 200-300 | 30 days supply | Qty: 30 | Fill #0

## 2020-04-22 MED FILL — TIVICAY 50 MG TABLET: 50 | 30 days supply | Qty: 30 | Fill #5

## 2020-04-25 MED FILL — valACYclovir HCL 1 GM TABS: 1 | 30 days supply | Qty: 30 | Fill #4

## 2020-04-28 ENCOUNTER — Other Ambulatory Visit: Payer: 59

## 2020-04-28 ENCOUNTER — Other Ambulatory Visit: Payer: Self-pay

## 2020-04-28 ENCOUNTER — Ambulatory Visit: Payer: 59 | Attending: Obstetrics

## 2020-04-28 ENCOUNTER — Encounter: Payer: Self-pay | Admitting: *Deleted

## 2020-04-28 ENCOUNTER — Other Ambulatory Visit (HOSPITAL_COMMUNITY): Payer: Self-pay

## 2020-04-28 ENCOUNTER — Ambulatory Visit: Payer: 59 | Admitting: *Deleted

## 2020-04-28 VITALS — BP 138/79 | HR 105

## 2020-04-28 DIAGNOSIS — O24415 Gestational diabetes mellitus in pregnancy, controlled by oral hypoglycemic drugs: Secondary | ICD-10-CM

## 2020-04-28 DIAGNOSIS — O99213 Obesity complicating pregnancy, third trimester: Secondary | ICD-10-CM | POA: Diagnosis present

## 2020-04-28 DIAGNOSIS — O98719 Human immunodeficiency virus [HIV] disease complicating pregnancy, unspecified trimester: Secondary | ICD-10-CM

## 2020-04-28 DIAGNOSIS — B20 Human immunodeficiency virus [HIV] disease: Secondary | ICD-10-CM | POA: Diagnosis not present

## 2020-04-28 DIAGNOSIS — O98713 Human immunodeficiency virus [HIV] disease complicating pregnancy, third trimester: Secondary | ICD-10-CM | POA: Diagnosis present

## 2020-04-28 DIAGNOSIS — O365930 Maternal care for other known or suspected poor fetal growth, third trimester, not applicable or unspecified: Secondary | ICD-10-CM

## 2020-04-28 DIAGNOSIS — O365931 Maternal care for other known or suspected poor fetal growth, third trimester, fetus 1: Secondary | ICD-10-CM | POA: Insufficient documentation

## 2020-04-28 DIAGNOSIS — Z3A31 31 weeks gestation of pregnancy: Secondary | ICD-10-CM

## 2020-05-06 ENCOUNTER — Ambulatory Visit (INDEPENDENT_AMBULATORY_CARE_PROVIDER_SITE_OTHER): Payer: 59 | Admitting: Infectious Disease

## 2020-05-06 ENCOUNTER — Other Ambulatory Visit: Payer: Self-pay

## 2020-05-06 ENCOUNTER — Encounter: Payer: Self-pay | Admitting: Infectious Disease

## 2020-05-06 VITALS — BP 114/82 | HR 124 | Temp 97.7°F | Wt 249.0 lb

## 2020-05-06 DIAGNOSIS — Z7185 Encounter for immunization safety counseling: Secondary | ICD-10-CM

## 2020-05-06 DIAGNOSIS — O09893 Supervision of other high risk pregnancies, third trimester: Secondary | ICD-10-CM

## 2020-05-06 DIAGNOSIS — B20 Human immunodeficiency virus [HIV] disease: Secondary | ICD-10-CM

## 2020-05-06 DIAGNOSIS — E119 Type 2 diabetes mellitus without complications: Secondary | ICD-10-CM | POA: Diagnosis not present

## 2020-05-06 NOTE — Progress Notes (Signed)
Subjective:  Chief complaint: follup for HIV in pregnant woman   Patient ID: Kelly Hunter, female    DOB: Jan 21, 1991, 30 y.o.   MRN: [id number]  HPI  30 year-old African-American lady with HIV disease with perfect virological suppression on TRIUMEQ --> Biktarvy  Since she has become pregnant and was in the first few weeks of her pregnancy was seen by my partner Dr. West Bali, and changed to twice a day Isentress plus once daily Truvada.  She  Tolerated these  medications well but did    She did not like taking multiple pills multiple times per day.  I reviewed with her the data from [place] and current updated DHHS guidelines which include TIVICAY as a preferred regimen throughout pregnancy and including for women he might become pregnant.  I offered options of TIVICAY plus Truvada versus TIVICAY plus DESCOVY.  We ended up going for TIVICAY and Truvada  She has remained perfectly suppressed on this regimen as well.  She is now nearing due date which is in May.  She still is not been vaccinated gets COVID-19 which I have counseled her strongly to do in the future in particular if she gets pregnant again.  She had breast reduction surgery and is not able to breast-feed her child thus discussions we had around breast-feeding or not really relevant anyway.  Past Medical History:  Diagnosis Date  . Acne   . Allergy    Allegra  . Asthma    mild; onset in childhood.  No hospitalizations.  . ASTHMA, EXERCISE INDUCED   . Cervical high risk HPV (human papillomavirus) test positive 07/2019   cervical cancer screening due in December, 2021  . Chlamydia 03/03/2012  . CIN I (cervical intraepithelial neoplasia I) 08/31/2013, 05/01/17   colposcopy by Toney Rakes; s/p CO2 laser treatment. repeat colposcopy by Dr. Quincy Simmonds.  . Eczema   . Elevated blood sugar 12/25/2017  . Genital warts 08/31/2013   Aldara treatment; Toney Rakes.  . Grieving 12/25/2017  . Herpes   . Hidradenitis   .  Hidradenitis axillaris 05/21/2014  . HIV (human immunodeficiency virus infection) ([place])   . HIV (human immunodeficiency virus) risk factors complicating pregnancy 02/58/5277  . HSV-2 seropositive   . Migraine headache without aura   . Moderate episode of recurrent major depressive disorder ([place]) 10/24/2018  . Substance abuse ([place])   . Type 2 diabetes mellitus without complication, without long-term current use of insulin ([place]) 11/17/2014  . Vaccine counseling 01/22/2020  . VIN I (vulvar intraepithelial neoplasia I) 08/31/2013   s/p CO2 treatment; [facility]andez/gyn.    Past Surgical History:  Procedure Laterality Date  . BREAST SURGERY  02/01/2008   Reduction  . CO2 LASER APPLICATION N/A 09/24/2351   Procedure: C02 laser of vagina and cervix;  Surgeon: Terrance Mass, MD;  Location: [facility];  Service: Gynecology;  Laterality: N/A;  . INCISION AND DRAINAGE ABSCESS Left 03/09/2017   Procedure: INCISION AND DRAINAGE ABSCESS LEFT BREAST ABSCESS;  Surgeon: Erroll Luna, MD;  Location: [facility];  Service: General;  Laterality: Left;    Family History  Problem Relation Age of Onset  . Diabetes Mother   . Hypertension Mother   . Hyperlipidemia Mother   . Mental illness Mother   . Pulmonary embolism Mother   . Cancer Maternal Grandfather        ?  Marland Kitchen Cancer Paternal Aunt        Cervical or Ovarian per patient      Social History  Socioeconomic History  . Marital status: Single    Spouse name: n/a  . Number of children: 0  . Years of education: Not on file  . Highest education level: Not on file  Occupational History  . Occupation: Freight forwarder    Comment: [employer]  Tobacco Use  . Smoking status: Never Smoker  . Smokeless tobacco: Never Used  Vaping Use  . Vaping Use: Never used  Substance and Sexual Activity  . Alcohol use: Not Currently    Alcohol/week: 0.0 standard drinks    Comment: socially  . Drug use: No    Types: Marijuana  . Sexual activity: Yes    Partners: Male    Birth  control/protection: Condom    Comment: condoms everytime  Other Topics Concern  . Not on file  Social History Narrative   Marital status: single;  Not dating in 2016.      Children: none; never pregnant.      Lives: with mom; dad in [place].     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      Education: previously in school; took medical leave in 02/2012; [employer] in [place].      Tobacco: vapor cigarette two months. Pt does not smoke.       Alcohol:  Socially; weekends; no DUIs.      Drugs: marijuana daily.  Started at age 37.        Sexual activity: sexually active; 25 total partners; males; Chlamydia in 03/2012.  Genital warts 08/2013.  HIV diagnosis 12/2013.      Seatbelt: 100%      Guns:  none   Social Determinants of Radio broadcast assistant Strain: Not on file  Food Insecurity: No Food Insecurity  . Worried About Charity fundraiser in the Last Year: Never true  . Ran Out of Food in the Last Year: Never true  Transportation Needs: Not on file  Physical Activity: Not on file  Stress: Not on file  Social Connections: Not on file    Allergies  Allergen Reactions  . Other Nausea And Vomiting and Swelling    SWELLING REACTION UNSPECIFIED  Tree Nuts (Almonds)  . Peanut Oil Nausea And Vomiting and Swelling    SWELLING REACTION UNSPECIFIED   . Peanut-Containing Drug Products Nausea And Vomiting and Swelling    SWELLING REACTION UNSPECIFIED   . Pyridium [Phenazopyridine Hcl] Nausea And Vomiting  . Bactrim [Sulfamethoxazole-Trimethoprim] Rash     Current Outpatient Medications:  .  dolutegravir (TIVICAY) 50 MG tablet, TAKE 1 TABLET (50 MG TOTAL) BY MOUTH DAILY., Disp: 30 tablet, Rfl: 11 .  emtricitabine-tenofovir (TRUVADA) 200-300 MG tablet, TAKE 1 TABLET BY MOUTH DAILY., Disp: 30 tablet, Rfl: 1 .  Lancets (ONETOUCH DELICA PLUS [id number]) MISC, Apply topically daily., Disp: , Rfl:  .  metFORMIN (GLUCOPHAGE) 500 MG tablet, TAKE 1  TABLET(500 MG) BY MOUTH TWICE DAILY WITH A MEAL, Disp: 60 tablet, Rfl: 1 .  Prenatal Vit-Fe Fumarate-FA (PRENATAL MULTIVITAMIN) TABS tablet, Take 1 tablet by mouth daily at 12 noon., Disp: , Rfl:  .  valACYclovir (VALTREX) 1000 MG tablet, TAKE 1 TABLET BY MOUTH ONCE A DAY, Disp: 30 tablet, Rfl: 4 .  Triamcinolone Acetonide (TRIAMCINOLONE 0.1 % CREAM : EUCERIN) CREA, Apply 1 application topically daily. As needed. (Patient not taking: No sig reported), Disp: 1 each, Rfl: 0   Review of Systems  Constitutional: Negative for activity change, appetite change, chills, diaphoresis, fatigue, fever  and unexpected weight change.  HENT: Negative for congestion, rhinorrhea, sinus pressure, sneezing, sore throat and trouble swallowing.   Eyes: Negative for photophobia and visual disturbance.  Respiratory: Negative for cough, chest tightness, shortness of breath, wheezing and stridor.   Cardiovascular: Negative for chest pain, palpitations and leg swelling.  Gastrointestinal: Negative for abdominal distention, abdominal pain, anal bleeding, blood in stool, constipation, diarrhea, nausea and vomiting.  Genitourinary: Negative for difficulty urinating, dysuria, flank pain and hematuria.  Musculoskeletal: Negative for arthralgias, back pain, gait problem, joint swelling and myalgias.  Skin: Negative for color change, pallor, rash and wound.  Neurological: Negative for dizziness, tremors, weakness and light-headedness.  Hematological: Negative for adenopathy. Does not bruise/bleed easily.  Psychiatric/Behavioral: Negative for agitation, behavioral problems, confusion, decreased concentration, dysphoric mood and sleep disturbance.       Objective:   Physical Exam Constitutional:      General: She is not in acute distress.    Appearance: She is not diaphoretic.  HENT:     Head: Normocephalic and atraumatic.     Right Ear: External ear normal.     Left Ear: External ear normal.     Nose: Nose normal.      Mouth/Throat:     Pharynx: No oropharyngeal exudate.  Eyes:     General: No scleral icterus.    Extraocular Movements: Extraocular movements intact.     Conjunctiva/sclera: Conjunctivae normal.  Cardiovascular:     Rate and Rhythm: Normal rate and regular rhythm.     Heart sounds: Normal heart sounds.  Pulmonary:     Effort: Pulmonary effort is normal. No respiratory distress.     Breath sounds: Normal breath sounds. No wheezing.  Abdominal:     General: Bowel sounds are normal.     Palpations: Abdomen is soft.  Musculoskeletal:        General: No tenderness. Normal range of motion.     Cervical back: Normal range of motion and neck supple.  Lymphadenopathy:     Cervical: No cervical adenopathy.  Skin:    General: Skin is warm and dry.     Coloration: Skin is not pale.     Findings: No erythema or rash.  Neurological:     Mental Status: She is alert and oriented to person, place, and time.     Coordination: Coordination normal.  Psychiatric:        Mood and Affect: Mood normal.        Behavior: Behavior normal.        Thought Content: Thought content normal.        Judgment: Judgment normal.     Gravid     Assessment & Plan:   HIV in a woman who is now pregnant.  Continue TIVICAY and Truvada for now switch back to [place] after she delivers.  Note if she continues to maintain her usual level of high adherence she should NOT need any IV AZT during delivery  HSV: on valtrex. Hopefully not active as she nears delivery. Ob aware of this  Ascus; need to followup with Ob/gyn re this later  Vaccine counseling is again counseled need for Covid vaccination  I spent greater than 30 minutes with the patient including greater than 50% of time in face to face counsel of the patient and in coordination of their care.

## 2020-05-07 ENCOUNTER — Other Ambulatory Visit: Payer: 59

## 2020-05-07 ENCOUNTER — Ambulatory Visit: Payer: 59

## 2020-05-09 LAB — COMPLETE METABOLIC PANEL WITH GFR
AG Ratio: 1 (calc) (ref 1.0–2.5)
ALT: 11 U/L (ref 6–29)
AST: 13 U/L (ref 10–30)
Albumin: 3.3 g/dL — ABNORMAL LOW (ref 3.6–5.1)
Alkaline phosphatase (APISO): 78 U/L (ref 31–125)
BUN: 8 mg/dL (ref 7–25)
CO2: 24 mmol/L (ref 20–32)
Calcium: 9.3 mg/dL (ref 8.6–10.2)
Chloride: 102 mmol/L (ref 98–110)
Creat: 0.6 mg/dL (ref 0.50–1.10)
GFR, Est African American: 144 mL/min/{1.73_m2} (ref >=60)
GFR, Est Non African American: 124 mL/min/{1.73_m2} (ref >=60)
Globulin: 3.2 g/dL (calc) (ref 1.9–3.7)
Glucose, Bld: 79 mg/dL (ref 65–99)
Potassium: 4.4 mmol/L (ref 3.5–5.3)
Sodium: 134 mmol/L — ABNORMAL LOW (ref 135–146)
Total Bilirubin: 0.3 mg/dL (ref 0.2–1.2)
Total Protein: 6.5 g/dL (ref 6.1–8.1)

## 2020-05-09 LAB — CBC WITH DIFFERENTIAL/PLATELET
Absolute Monocytes: 557 cells/uL (ref 200–950)
Basophils Absolute: 44 cells/uL (ref 0–200)
Basophils Relative: 0.5 %
Eosinophils Absolute: 70 cells/uL (ref 15–500)
Eosinophils Relative: 0.8 %
HCT: 33.9 % — ABNORMAL LOW (ref 35.0–45.0)
Hemoglobin: 11.2 g/dL — ABNORMAL LOW (ref 11.7–15.5)
Lymphs Abs: 1914 cells/uL (ref 850–3900)
MCH: 27.9 pg (ref 27.0–33.0)
MCHC: 33 g/dL (ref 32.0–36.0)
MCV: 84.5 fL (ref 80.0–100.0)
MPV: 9.4 fL (ref 7.5–12.5)
Monocytes Relative: 6.4 %
Neutro Abs: 6116 cells/uL (ref 1500–7800)
Neutrophils Relative %: 70.3 %
Platelets: 410 10*3/uL — ABNORMAL HIGH (ref 140–400)
RBC: 4.01 10*6/uL (ref 3.80–5.10)
RDW: 14.8 % (ref 11.0–15.0)
Total Lymphocyte: 22 %
WBC: 8.7 10*3/uL (ref 3.8–10.8)

## 2020-05-09 LAB — RPR: RPR Ser Ql: NONREACTIVE

## 2020-05-09 LAB — HIV-1 RNA QUANT-NO REFLEX-BLD
HIV 1 RNA Quant: NOT DETECTED Copies/mL
HIV-1 RNA Quant, Log: NOT DETECTED Log cps/mL

## 2020-05-12 ENCOUNTER — Ambulatory Visit: Payer: 59

## 2020-05-13 ENCOUNTER — Other Ambulatory Visit: Payer: Self-pay | Admitting: Obstetrics & Gynecology

## 2020-05-16 ENCOUNTER — Inpatient Hospital Stay (HOSPITAL_COMMUNITY)
Admission: AD | Admit: 2020-05-16 | Discharge: 2020-05-16 | Disposition: A | Discharge status: Home / Self Care | Payer: 59 | Attending: Obstetrics & Gynecology | Admitting: Obstetrics & Gynecology

## 2020-05-16 ENCOUNTER — Other Ambulatory Visit: Payer: Self-pay

## 2020-05-16 ENCOUNTER — Encounter (HOSPITAL_COMMUNITY): Payer: Self-pay | Admitting: Obstetrics & Gynecology

## 2020-05-16 DIAGNOSIS — A059 Bacterial foodborne intoxication, unspecified: Secondary | ICD-10-CM | POA: Diagnosis not present

## 2020-05-16 DIAGNOSIS — R509 Fever, unspecified: Secondary | ICD-10-CM

## 2020-05-16 DIAGNOSIS — R030 Elevated blood-pressure reading, without diagnosis of hypertension: Secondary | ICD-10-CM | POA: Insufficient documentation

## 2020-05-16 DIAGNOSIS — O99891 Other specified diseases and conditions complicating pregnancy: Secondary | ICD-10-CM | POA: Insufficient documentation

## 2020-05-16 DIAGNOSIS — Z79899 Other long term (current) drug therapy: Secondary | ICD-10-CM | POA: Insufficient documentation

## 2020-05-16 DIAGNOSIS — O9A213 Injury, poisoning and certain other consequences of external causes complicating pregnancy, third trimester: Secondary | ICD-10-CM | POA: Diagnosis not present

## 2020-05-16 DIAGNOSIS — Z833 Family history of diabetes mellitus: Secondary | ICD-10-CM | POA: Insufficient documentation

## 2020-05-16 DIAGNOSIS — O368130 Decreased fetal movements, third trimester, not applicable or unspecified: Secondary | ICD-10-CM | POA: Diagnosis not present

## 2020-05-16 DIAGNOSIS — Z3A34 34 weeks gestation of pregnancy: Secondary | ICD-10-CM

## 2020-05-16 DIAGNOSIS — O212 Late vomiting of pregnancy: Secondary | ICD-10-CM | POA: Diagnosis present

## 2020-05-16 DIAGNOSIS — O24415 Gestational diabetes mellitus in pregnancy, controlled by oral hypoglycemic drugs: Secondary | ICD-10-CM | POA: Insufficient documentation

## 2020-05-16 DIAGNOSIS — Z881 Allergy status to other antibiotic agents status: Secondary | ICD-10-CM | POA: Insufficient documentation

## 2020-05-16 DIAGNOSIS — Z20822 Contact with and (suspected) exposure to covid-19: Secondary | ICD-10-CM | POA: Diagnosis not present

## 2020-05-16 DIAGNOSIS — Z7984 Long term (current) use of oral hypoglycemic drugs: Secondary | ICD-10-CM | POA: Diagnosis not present

## 2020-05-16 DIAGNOSIS — Z3689 Encounter for other specified antenatal screening: Secondary | ICD-10-CM

## 2020-05-16 LAB — COMPREHENSIVE METABOLIC PANEL
ALT: 17 U/L (ref 0–44)
AST: 19 U/L (ref 15–41)
Albumin: 2.7 g/dL — ABNORMAL LOW (ref 3.5–5.0)
Alkaline Phosphatase: 83 U/L (ref 38–126)
Anion gap: 10 (ref 5–15)
BUN: <5 mg/dL — ABNORMAL LOW (ref 6–20)
CO2: 16 mmol/L — ABNORMAL LOW (ref 22–32)
Calcium: 8.7 mg/dL — ABNORMAL LOW (ref 8.9–10.3)
Chloride: 106 mmol/L (ref 98–111)
Creatinine, Ser: 0.68 mg/dL (ref 0.44–1.00)
GFR, Estimated: >60 mL/min (ref >60)
Glucose, Bld: 125 mg/dL — ABNORMAL HIGH (ref 70–99)
Potassium: 3.4 mmol/L — ABNORMAL LOW (ref 3.5–5.1)
Sodium: 132 mmol/L — ABNORMAL LOW (ref 135–145)
Total Bilirubin: 1 mg/dL (ref 0.3–1.2)
Total Protein: 6.7 g/dL (ref 6.5–8.1)

## 2020-05-16 LAB — URINALYSIS, ROUTINE W REFLEX MICROSCOPIC
Bilirubin Urine: NEGATIVE
Glucose, UA: NEGATIVE mg/dL
Ketones, ur: 80 mg/dL — AB
Leukocytes,Ua: NEGATIVE
Nitrite: NEGATIVE
Protein, ur: 30 mg/dL — AB
Specific Gravity, Urine: 1.018 (ref 1.005–1.030)
pH: 5 (ref 5.0–8.0)

## 2020-05-16 LAB — CBC WITH DIFFERENTIAL/PLATELET
Abs Immature Granulocytes: 0.05 10*3/uL (ref 0.00–0.07)
Basophils Absolute: 0 10*3/uL (ref 0.0–0.1)
Basophils Relative: 0 %
Eosinophils Absolute: 0 10*3/uL (ref 0.0–0.5)
Eosinophils Relative: 0 %
HCT: 31.9 % — ABNORMAL LOW (ref 36.0–46.0)
Hemoglobin: 11.5 g/dL — ABNORMAL LOW (ref 12.0–15.0)
Immature Granulocytes: 1 %
Lymphocytes Relative: 7 %
Lymphs Abs: 0.8 10*3/uL (ref 0.7–4.0)
MCH: 28.4 pg (ref 26.0–34.0)
MCHC: 36.1 g/dL — ABNORMAL HIGH (ref 30.0–36.0)
MCV: 78.8 fL — ABNORMAL LOW (ref 80.0–100.0)
Monocytes Absolute: 0.4 10*3/uL (ref 0.1–1.0)
Monocytes Relative: 3 %
Neutro Abs: 9.9 10*3/uL — ABNORMAL HIGH (ref 1.7–7.7)
Neutrophils Relative %: 89 %
Platelets: 400 10*3/uL (ref 150–400)
RBC: 4.05 MIL/uL (ref 3.87–5.11)
RDW: 13.8 % (ref 11.5–15.5)
WBC: 11.1 10*3/uL — ABNORMAL HIGH (ref 4.0–10.5)
nRBC: 0 % (ref 0.0–0.2)

## 2020-05-16 LAB — PROTEIN / CREATININE RATIO, URINE
Creatinine, Urine: 104.68 mg/dL
Protein Creatinine Ratio: 0.29 mg/mg{Cre} — ABNORMAL HIGH (ref 0.00–0.15)
Total Protein, Urine: 30 mg/dL

## 2020-05-16 LAB — RESP PANEL BY RT-PCR (FLU A&B, COVID) ARPGX2
Influenza A by PCR: NEGATIVE
Influenza B by PCR: NEGATIVE
SARS Coronavirus 2 by RT PCR: NEGATIVE

## 2020-05-16 MED ORDER — SODIUM CHLORIDE 0.9 % IV SOLN
25.0000 mg | Freq: Once | INTRAVENOUS | Status: AC
  Administered 2020-05-16: 25 mg via INTRAVENOUS
  Filled 2020-05-16: qty 1

## 2020-05-16 MED ORDER — LACTATED RINGERS IV BOLUS
1000.0000 mL | Freq: Once | INTRAVENOUS | Status: AC
  Administered 2020-05-16: 1000 mL via INTRAVENOUS

## 2020-05-16 MED ORDER — ONDANSETRON 8 MG PO TBDP: 8.0000 mg | ORAL_TABLET | Freq: Three times a day (TID) | ORAL | 0 refills | Status: DC | PRN

## 2020-05-16 MED ORDER — ONDANSETRON HCL 4 MG/2ML IJ SOLN
4.0000 mg | Freq: Four times a day (QID) | INTRAMUSCULAR | Status: DC | PRN
  Administered 2020-05-16: 4 mg via INTRAVENOUS
  Filled 2020-05-16: qty 2

## 2020-05-16 MED ORDER — PROMETHAZINE HCL 25 MG/ML IJ SOLN: 25.0000 mg | Freq: Once | INTRAVENOUS | Status: DC

## 2020-05-16 MED ORDER — ACETAMINOPHEN 500 MG PO TABS
1000.0000 mg | ORAL_TABLET | Freq: Once | ORAL | Status: AC
  Administered 2020-05-16: 1000 mg via ORAL
  Filled 2020-05-16: qty 2

## 2020-05-16 NOTE — MAU Provider Note (Addendum)
None     Chief Complaint:  Emesis, Diarrhea, and Decreased Fetal Movement   Kelly Hunter is  30 y.o. G2P0010 at [redacted]w[redacted]d presents complaining of Emesis, Diarrhea, and Decreased Fetal Movement Pt of [facility]. Ate chicken wings from [employer] at 2200, started having vomiting and diarrhea at 0930.  Thinks she has food poisoning. Denies any known exposures to anyone w/similar sx.  Last diarrhea 1500.  Also c/o decreased FM this afternoon.  Not feeling ctx/cramps. Had A2DM (metformin).  Did not take it today.    Obstetrical/Gynecological History: OB History    Gravida  2   Para  0   Term  0   Preterm  0   AB  1   Living  0     SAB  0   IAB  0   Ectopic  0   Multiple  0   Live Births  0          Past Medical History: Past Medical History:  Diagnosis Date  . Acne   . Allergy    Allegra  . Asthma    mild; onset in childhood.  No hospitalizations.  . ASTHMA, EXERCISE INDUCED   . Cervical high risk HPV (human papillomavirus) test positive 07/2019   cervical cancer screening due in December, 2021  . Chlamydia 03/03/2012  . CIN I (cervical intraepithelial neoplasia I) 08/31/2013, 05/01/17   colposcopy by Toney Rakes; s/p CO2 laser treatment. repeat colposcopy by Dr. Quincy Simmonds.  . Eczema   . Elevated blood sugar 12/25/2017  . Genital warts 08/31/2013   Aldara treatment; Toney Rakes.  . Grieving 12/25/2017  . Herpes   . Hidradenitis   . Hidradenitis axillaris 05/21/2014  . HIV (human immunodeficiency virus infection) ([place])   . HIV (human immunodeficiency virus) risk factors complicating pregnancy 56/25/6389  . HSV-2 seropositive   . Migraine headache without aura   . Moderate episode of recurrent major depressive disorder ([place]) 10/24/2018  . Substance abuse ([place])   . Type 2 diabetes mellitus without complication, without long-term current use of insulin ([place]) 11/17/2014  . Vaccine counseling 01/22/2020  . VIN I (vulvar intraepithelial neoplasia I) 08/31/2013   s/p CO2 treatment;  Fernandez/gyn.    Past Surgical History: Past Surgical History:  Procedure Laterality Date  . BREAST SURGERY  02/01/2008   Reduction  . CO2 LASER APPLICATION N/A 3/73/4287   Procedure: C02 laser of vagina and cervix;  Surgeon: Terrance Mass, MD;  Location: [facility];  Service: Gynecology;  Laterality: N/A;  . INCISION AND DRAINAGE ABSCESS Left 03/09/2017   Procedure: INCISION AND DRAINAGE ABSCESS LEFT BREAST ABSCESS;  Surgeon: Erroll Luna, MD;  Location: [facility];  Service: General;  Laterality: Left;    Family History: Family History  Problem Relation Age of Onset  . Diabetes Mother   . Hypertension Mother   . Hyperlipidemia Mother   . Mental illness Mother   . Pulmonary embolism Mother   . Cancer Maternal Grandfather        ?  Marland Kitchen Cancer Paternal Aunt        Cervical or Ovarian per patient    Social History: Social History   Tobacco Use  . Smoking status: Never Smoker  . Smokeless tobacco: Never Used  Vaping Use  . Vaping Use: Never used  Substance Use Topics  . Alcohol use: Not Currently    Alcohol/week: 0.0 standard drinks    Comment: socially  . Drug use: No    Types: Marijuana    Allergies:  Allergies  Allergen Reactions  . Other Nausea And Vomiting and Swelling    SWELLING REACTION UNSPECIFIED  Tree Nuts (Almonds)  . Peanut Oil Nausea And Vomiting and Swelling    SWELLING REACTION UNSPECIFIED   . Peanut-Containing Drug Products Nausea And Vomiting and Swelling    SWELLING REACTION UNSPECIFIED   . Pyridium [Phenazopyridine Hcl] Nausea And Vomiting  . Bactrim [Sulfamethoxazole-Trimethoprim] Rash    Meds:  Medications Prior to Admission  Medication Sig Dispense Refill Last Dose  . dolutegravir (TIVICAY) 50 MG tablet TAKE 1 TABLET (50 MG TOTAL) BY MOUTH DAILY. 30 tablet 11 05/15/2020 at Unknown time  . emtricitabine-tenofovir (TRUVADA) 200-300 MG tablet TAKE 1 TABLET BY MOUTH DAILY. 30 tablet 1 05/15/2020 at Unknown time  . Lancets (ONETOUCH DELICA PLUS  [id number]) MISC Apply topically daily.   05/15/2020 at Unknown time  . metFORMIN (GLUCOPHAGE) 500 MG tablet TAKE 1 TABLET(500 MG) BY MOUTH TWICE DAILY WITH A MEAL 60 tablet 1 05/15/2020 at Unknown time  . Prenatal Vit-Fe Fumarate-FA (PRENATAL MULTIVITAMIN) TABS tablet Take 1 tablet by mouth daily at 12 noon.   05/15/2020 at Unknown time  . Triamcinolone Acetonide (TRIAMCINOLONE 0.1 % CREAM : EUCERIN) CREA Apply 1 application topically daily. As needed. 1 each 0 05/15/2020 at Unknown time  . valACYclovir (VALTREX) 1000 MG tablet TAKE 1 TABLET BY MOUTH ONCE A DAY 30 tablet 4 05/15/2020 at Unknown time    Review of Systems   Constitutional: Negative for fever and chills Eyes: Negative for visual disturbances Respiratory: Negative for shortness of breath, dyspnea Cardiovascular: Negative for chest pain or palpitations  Gastrointestinal:POSITIVE for vomiting, diarrhea  Genitourinary: Negative for dysuria and urgency Musculoskeletal: Negative for back pain, joint pain, myalgias.  Normal ROM  Neurological: Negative for dizziness and headaches  Patient Vitals for the past 4 hrs:  BP Temp Temp src Pulse Resp SpO2  05/16/20 2302 -- 99.3 F (37.4 C) Oral -- -- --  05/16/20 2204 113/75 -- -- (!) 130 16 100 %  05/16/20 2200 (!) 87/55 -- -- (!) 128 16 --  05/16/20 2145 (!) 99/59 100.3 F (37.9 C) Oral (!) 136 -- 99 %  05/16/20 2130 118/62 -- -- (!) 135 16 --  05/16/20 2115 (!) 97/55 -- -- (!) 135 16 --  05/16/20 2100 124/74 -- -- (!) 129 16 --  05/16/20 2030 117/62 -- -- (!) 129 18 99 %  05/16/20 2015 (!) 98/42 -- -- (!) 137 18 --  05/16/20 2000 117/74 -- -- (!) 134 18 --  05/16/20 1930 (!) 157/88 -- -- (!) 141 18 --  05/16/20 1915 (!) 151/91 -- -- (!) 141 18 --   BPs at 31 &32 weeks 137/81 & 138/79  Physical Exam  Blood pressure 117/62, pulse (!) 129, temperature 99.9 F (37.7 C), temperature source Oral, resp. rate 18, last menstrual period 09/18/2019, SpO2 99 %. GENERAL: Well-developed,  well-nourished female in no acute distress.  LUNGS: Normal respiratory effort HEART: Regular rate and rhythm. ABDOMEN: Soft, nontender, nondistended, gravid.  EXTREMITIES: Nontender, no edema, 2+ distal pulses. DTR's 2+ CERVICAL EXAM: Dilatation cm   Effacement %   Station    Presentation:  FHT:  Baseline rate 180 bpm   Variability moderate  Accelerations present   Decelerations none Contractions: Every 2-4 mins   Labs: Results for orders placed or performed during the hospital encounter of 05/16/20 (from the past 24 hour(s))  CBC with Differential/Platelet   Collection Time: 05/16/20  6:50 PM  Result Value Ref Range  WBC 11.1 (H) 4.0 - 10.5 K/uL   RBC 4.05 3.87 - 5.11 MIL/uL   Hemoglobin 11.5 (L) 12.0 - 15.0 g/dL   HCT 31.9 (L) 36.0 - 46.0 %   MCV 78.8 (L) 80.0 - 100.0 fL   MCH 28.4 26.0 - 34.0 pg   MCHC 36.1 (H) 30.0 - 36.0 g/dL   RDW 13.8 11.5 - 15.5 %   Platelets 400 150 - 400 K/uL   nRBC 0.0 0.0 - 0.2 %   Neutrophils Relative % 89 %   Neutro Abs 9.9 (H) 1.7 - 7.7 K/uL   Lymphocytes Relative 7 %   Lymphs Abs 0.8 0.7 - 4.0 K/uL   Monocytes Relative 3 %   Monocytes Absolute 0.4 0.1 - 1.0 K/uL   Eosinophils Relative 0 %   Eosinophils Absolute 0.0 0.0 - 0.5 K/uL   Basophils Relative 0 %   Basophils Absolute 0.0 0.0 - 0.1 K/uL   Immature Granulocytes 1 %   Abs Immature Granulocytes 0.05 0.00 - 0.07 K/uL  Urinalysis, Routine w reflex microscopic   Collection Time: 05/16/20  7:15 PM  Result Value Ref Range   Color, Urine YELLOW YELLOW   APPearance HAZY (A) CLEAR   Specific Gravity, Urine 1.018 1.005 - 1.030   pH 5.0 5.0 - 8.0   Glucose, UA NEGATIVE NEGATIVE mg/dL   Hgb urine dipstick SMALL (A) NEGATIVE   Bilirubin Urine NEGATIVE NEGATIVE   Ketones, ur 80 (A) NEGATIVE mg/dL   Protein, ur 30 (A) NEGATIVE mg/dL   Nitrite NEGATIVE NEGATIVE   Leukocytes,Ua NEGATIVE NEGATIVE   RBC / HPF 0-5 0 - 5 RBC/hpf   WBC, UA 0-5 0 - 5 WBC/hpf   Bacteria, UA RARE (A) NONE SEEN    Squamous Epithelial / LPF 6-10 0 - 5   Mucus PRESENT   Comprehensive metabolic panel   Collection Time: 05/16/20  7:18 PM  Result Value Ref Range   Sodium 132 (L) 135 - 145 mmol/L   Potassium 3.4 (L) 3.5 - 5.1 mmol/L   Chloride 106 98 - 111 mmol/L   CO2 16 (L) 22 - 32 mmol/L   Glucose, Bld 125 (H) 70 - 99 mg/dL   BUN <5 (L) 6 - 20 mg/dL   Creatinine, Ser 0.68 0.44 - 1.00 mg/dL   Calcium 8.7 (L) 8.9 - 10.3 mg/dL   Total Protein 6.7 6.5 - 8.1 g/dL   Albumin 2.7 (L) 3.5 - 5.0 g/dL   AST 19 15 - 41 U/L   ALT 17 0 - 44 U/L   Alkaline Phosphatase 83 38 - 126 U/L   Total Bilirubin 1.0 0.3 - 1.2 mg/dL   GFR, Estimated >60 >60 mL/min   Anion gap 10 5 - 15   Care turned over to Noni Saupe, NP Christin Fudge 4/16/20228:59 PM   Resumed care of the patient at 9:00 pm. Lr bolus x 2  Patient tolerating oral fluids without vomiting. UP to urinate without difficulty. Discussed patient with Clois Dupes CNM, patient to have BP check on Monday.  A:  1. Food poisoning   2. Elevated BP without diagnosis of hypertension   3. Fever, unspecified fever cause   4. NST (non-stress test) reactive     P  Discharge home in stable condition  RX: Zofran Return to MAU if symptoms worsen Ok to use tylenol as directed on the bottle. BRAT diet.    Lezlie Lye, NP 05/16/2020 11:14 PM

## 2020-05-16 NOTE — MAU Note (Signed)
Pt reports to mau with c/o diarrhea and vomiting all morning.  Pt states she thinks she may have eaten some "contaminated food" last night.  Pt also reports DFM today.  Denies ctx or LOF.

## 2020-05-20 ENCOUNTER — Ambulatory Visit: Payer: 59

## 2020-05-20 ENCOUNTER — Other Ambulatory Visit: Payer: 59

## 2020-05-20 ENCOUNTER — Telehealth (HOSPITAL_COMMUNITY): Payer: Self-pay | Admitting: *Deleted

## 2020-05-20 NOTE — Telephone Encounter (Signed)
Preadmission screen  

## 2020-05-21 ENCOUNTER — Telehealth (HOSPITAL_COMMUNITY): Payer: Self-pay | Admitting: *Deleted

## 2020-05-21 ENCOUNTER — Encounter (HOSPITAL_COMMUNITY): Payer: Self-pay | Admitting: *Deleted

## 2020-05-21 NOTE — Telephone Encounter (Signed)
Preadmission screen  

## 2020-05-25 ENCOUNTER — Other Ambulatory Visit (HOSPITAL_COMMUNITY): Payer: Self-pay

## 2020-05-25 ENCOUNTER — Other Ambulatory Visit: Payer: Self-pay | Admitting: Infectious Disease

## 2020-05-25 DIAGNOSIS — A6000 Herpesviral infection of urogenital system, unspecified: Secondary | ICD-10-CM

## 2020-05-25 MED ORDER — VALACYCLOVIR HCL 1 G PO TABS
ORAL_TABLET | Freq: Every day | ORAL | 4 refills | Status: DC
  Filled 2020-05-25: qty 30, 30d supply, fill #0

## 2020-05-25 MED FILL — Dolutegravir Sodium Tab 50 MG (Base Equiv): ORAL | 30 days supply | Qty: 30 | Fill #0 | Status: AC

## 2020-05-25 MED FILL — Emtricitabine-Tenofovir Disoproxil Fumarate Tab 200-300 MG: ORAL | 30 days supply | Qty: 30 | Fill #0 | Status: AC

## 2020-05-26 ENCOUNTER — Other Ambulatory Visit (HOSPITAL_COMMUNITY): Payer: Self-pay

## 2020-05-30 LAB — OB RESULTS CONSOLE GBS: GBS: NEGATIVE

## 2020-05-31 DIAGNOSIS — Z419 Encounter for procedure for purposes other than remedying health state, unspecified: Secondary | ICD-10-CM | POA: Diagnosis not present

## 2020-06-01 ENCOUNTER — Other Ambulatory Visit (HOSPITAL_COMMUNITY)
Admission: RE | Admit: 2020-06-01 | Discharge: 2020-06-01 | Disposition: A | Discharge status: Home / Self Care | Payer: 59 | Source: Ambulatory Visit | Attending: Obstetrics & Gynecology | Admitting: Obstetrics & Gynecology

## 2020-06-01 DIAGNOSIS — Z01812 Encounter for preprocedural laboratory examination: Secondary | ICD-10-CM | POA: Diagnosis not present

## 2020-06-01 DIAGNOSIS — Z20822 Contact with and (suspected) exposure to covid-19: Secondary | ICD-10-CM | POA: Insufficient documentation

## 2020-06-02 LAB — SARS CORONAVIRUS 2 (TAT 6-24 HRS): SARS Coronavirus 2: NEGATIVE

## 2020-06-03 ENCOUNTER — Inpatient Hospital Stay (HOSPITAL_COMMUNITY): Admission: AD | Admit: 2020-06-03 | Payer: 59 | Source: Home / Self Care | Admitting: Obstetrics & Gynecology

## 2020-06-03 ENCOUNTER — Inpatient Hospital Stay (HOSPITAL_COMMUNITY): Payer: 59

## 2020-06-16 ENCOUNTER — Inpatient Hospital Stay (HOSPITAL_COMMUNITY)
Admission: AD | Admit: 2020-06-16 | Discharge: 2020-06-21 | DRG: 787 | Disposition: A | Discharge status: Home / Self Care | Payer: 59 | Attending: Obstetrics & Gynecology | Admitting: Obstetrics & Gynecology

## 2020-06-16 ENCOUNTER — Encounter (HOSPITAL_COMMUNITY): Payer: Self-pay | Admitting: Obstetrics & Gynecology

## 2020-06-16 DIAGNOSIS — F418 Other specified anxiety disorders: Secondary | ICD-10-CM

## 2020-06-16 DIAGNOSIS — Z21 Asymptomatic human immunodeficiency virus [HIV] infection status: Secondary | ICD-10-CM | POA: Diagnosis present

## 2020-06-16 DIAGNOSIS — O2412 Pre-existing diabetes mellitus, type 2, in childbirth: Secondary | ICD-10-CM | POA: Diagnosis present

## 2020-06-16 DIAGNOSIS — E119 Type 2 diabetes mellitus without complications: Secondary | ICD-10-CM | POA: Diagnosis present

## 2020-06-16 DIAGNOSIS — D259 Leiomyoma of uterus, unspecified: Secondary | ICD-10-CM | POA: Diagnosis present

## 2020-06-16 DIAGNOSIS — Z7984 Long term (current) use of oral hypoglycemic drugs: Secondary | ICD-10-CM

## 2020-06-16 DIAGNOSIS — Z3A38 38 weeks gestation of pregnancy: Secondary | ICD-10-CM

## 2020-06-16 DIAGNOSIS — O3413 Maternal care for benign tumor of corpus uteri, third trimester: Secondary | ICD-10-CM | POA: Diagnosis present

## 2020-06-16 DIAGNOSIS — O9832 Other infections with a predominantly sexual mode of transmission complicating childbirth: Secondary | ICD-10-CM | POA: Diagnosis present

## 2020-06-16 DIAGNOSIS — O9872 Human immunodeficiency virus [HIV] disease complicating childbirth: Secondary | ICD-10-CM | POA: Diagnosis present

## 2020-06-16 DIAGNOSIS — O1414 Severe pre-eclampsia complicating childbirth: Principal | ICD-10-CM | POA: Diagnosis present

## 2020-06-16 DIAGNOSIS — F1210 Cannabis abuse, uncomplicated: Secondary | ICD-10-CM | POA: Diagnosis present

## 2020-06-16 DIAGNOSIS — O99214 Obesity complicating childbirth: Secondary | ICD-10-CM | POA: Diagnosis present

## 2020-06-16 DIAGNOSIS — Z8659 Personal history of other mental and behavioral disorders: Secondary | ICD-10-CM

## 2020-06-16 DIAGNOSIS — A6000 Herpesviral infection of urogenital system, unspecified: Secondary | ICD-10-CM | POA: Diagnosis present

## 2020-06-16 DIAGNOSIS — D62 Acute posthemorrhagic anemia: Secondary | ICD-10-CM | POA: Diagnosis not present

## 2020-06-16 DIAGNOSIS — O365930 Maternal care for other known or suspected poor fetal growth, third trimester, not applicable or unspecified: Secondary | ICD-10-CM | POA: Diagnosis present

## 2020-06-16 DIAGNOSIS — O99324 Drug use complicating childbirth: Secondary | ICD-10-CM | POA: Diagnosis present

## 2020-06-16 DIAGNOSIS — O1404 Mild to moderate pre-eclampsia, complicating childbirth: Secondary | ICD-10-CM | POA: Diagnosis present

## 2020-06-16 DIAGNOSIS — O9081 Anemia of the puerperium: Secondary | ICD-10-CM | POA: Diagnosis not present

## 2020-06-16 DIAGNOSIS — O99210 Obesity complicating pregnancy, unspecified trimester: Secondary | ICD-10-CM | POA: Diagnosis present

## 2020-06-16 DIAGNOSIS — Z3490 Encounter for supervision of normal pregnancy, unspecified, unspecified trimester: Secondary | ICD-10-CM | POA: Diagnosis present

## 2020-06-16 DIAGNOSIS — O365990 Maternal care for other known or suspected poor fetal growth, unspecified trimester, not applicable or unspecified: Secondary | ICD-10-CM | POA: Diagnosis present

## 2020-06-16 DIAGNOSIS — O1410 Severe pre-eclampsia, unspecified trimester: Secondary | ICD-10-CM | POA: Diagnosis not present

## 2020-06-16 HISTORY — DX: Unspecified abnormal cytological findings in specimens from vagina: R87.629

## 2020-06-16 HISTORY — DX: Other specified anxiety disorders: F41.8

## 2020-06-16 LAB — GLUCOSE, CAPILLARY
Glucose-Capillary: 222 mg/dL — ABNORMAL HIGH (ref 70–99)
Glucose-Capillary: 165 mg/dL — ABNORMAL HIGH (ref 70–99)
Glucose-Capillary: 86 mg/dL (ref 70–99)
Glucose-Capillary: 134 mg/dL — ABNORMAL HIGH (ref 70–99)

## 2020-06-16 LAB — GLUCOSE, RANDOM: Glucose, Bld: 163 mg/dL — ABNORMAL HIGH (ref 70–99)

## 2020-06-16 LAB — COMPREHENSIVE METABOLIC PANEL
ALT: 17 U/L (ref 0–44)
AST: 24 U/L (ref 15–41)
Albumin: 2.5 g/dL — ABNORMAL LOW (ref 3.5–5.0)
Alkaline Phosphatase: 125 U/L (ref 38–126)
Anion gap: 10 (ref 5–15)
BUN: 8 mg/dL (ref 6–20)
CO2: 19 mmol/L — ABNORMAL LOW (ref 22–32)
Calcium: 8.8 mg/dL — ABNORMAL LOW (ref 8.9–10.3)
Chloride: 105 mmol/L (ref 98–111)
Creatinine, Ser: 1.07 mg/dL — ABNORMAL HIGH (ref 0.44–1.00)
GFR, Estimated: >60 mL/min (ref >60)
Glucose, Bld: 211 mg/dL — ABNORMAL HIGH (ref 70–99)
Potassium: 4 mmol/L (ref 3.5–5.1)
Sodium: 134 mmol/L — ABNORMAL LOW (ref 135–145)
Total Bilirubin: 0.4 mg/dL (ref 0.3–1.2)
Total Protein: 6.4 g/dL — ABNORMAL LOW (ref 6.5–8.1)

## 2020-06-16 LAB — CBC
HCT: 31.7 % — ABNORMAL LOW (ref 36.0–46.0)
Hemoglobin: 11.4 g/dL — ABNORMAL LOW (ref 12.0–15.0)
MCH: 28 pg (ref 26.0–34.0)
MCHC: 36 g/dL (ref 30.0–36.0)
MCV: 77.9 fL — ABNORMAL LOW (ref 80.0–100.0)
Platelets: 406 10*3/uL — ABNORMAL HIGH (ref 150–400)
RBC: 4.07 MIL/uL (ref 3.87–5.11)
RDW: 15.1 % (ref 11.5–15.5)
WBC: 10.3 10*3/uL (ref 4.0–10.5)
nRBC: 0 % (ref 0.0–0.2)

## 2020-06-16 LAB — TYPE AND SCREEN
ABO/RH(D): B POS
Antibody Screen: NEGATIVE

## 2020-06-16 LAB — PROTEIN / CREATININE RATIO, URINE
Creatinine, Urine: 256.42 mg/dL
Protein Creatinine Ratio: 0.57 mg/mg{Cre} — ABNORMAL HIGH (ref 0.00–0.15)
Total Protein, Urine: 145 mg/dL

## 2020-06-16 MED ORDER — METFORMIN HCL 500 MG PO TABS
1000.0000 mg | ORAL_TABLET | Freq: Every day | ORAL | Status: DC
  Administered 2020-06-16 – 2020-06-21 (×6): 1000 mg via ORAL
  Filled 2020-06-16 (×7): qty 2

## 2020-06-16 MED ORDER — ZIDOVUDINE 10 MG/ML IV SOLN
1.0000 mg/kg/h | INTRAVENOUS | Status: DC
Start: 2020-06-17 — End: 2020-06-18
  Administered 2020-06-17 – 2020-06-18 (×10): 1 mg/kg/h via INTRAVENOUS
  Filled 2020-06-16 (×16): qty 40

## 2020-06-16 MED ORDER — MISOPROSTOL 50MCG HALF TABLET
50.0000 ug | ORAL_TABLET | ORAL | Status: DC | PRN
  Administered 2020-06-16 – 2020-06-17 (×4): 50 ug via ORAL
  Filled 2020-06-16 (×4): qty 1

## 2020-06-16 MED ORDER — OXYTOCIN-SODIUM CHLORIDE 30-0.9 UT/500ML-% IV SOLN
2.5000 [IU]/h | INTRAVENOUS | Status: DC
  Filled 2020-06-16: qty 500

## 2020-06-16 MED ORDER — LABETALOL HCL 5 MG/ML IV SOLN: 80.0000 mg | INTRAVENOUS | Status: DC | PRN

## 2020-06-16 MED ORDER — LABETALOL HCL 5 MG/ML IV SOLN
20.0000 mg | INTRAVENOUS | Status: DC | PRN
  Administered 2020-06-16 – 2020-06-18 (×3): 20 mg via INTRAVENOUS
  Filled 2020-06-16 (×3): qty 4

## 2020-06-16 MED ORDER — LABETALOL HCL 5 MG/ML IV SOLN
20.0000 mg | INTRAVENOUS | Status: DC | PRN
  Administered 2020-06-16: 20 mg via INTRAVENOUS

## 2020-06-16 MED ORDER — GLYBURIDE 5 MG PO TABS
5.0000 mg | ORAL_TABLET | Freq: Every day | ORAL | Status: DC
  Administered 2020-06-16 – 2020-06-21 (×3): 5 mg via ORAL
  Filled 2020-06-16 (×5): qty 1

## 2020-06-16 MED ORDER — ACETAMINOPHEN 325 MG PO TABS: 650.0000 mg | ORAL_TABLET | ORAL | Status: DC | PRN

## 2020-06-16 MED ORDER — FENTANYL CITRATE (PF) 100 MCG/2ML IJ SOLN
50.0000 ug | INTRAMUSCULAR | Status: DC | PRN
  Administered 2020-06-17 (×3): 100 ug via INTRAVENOUS
  Filled 2020-06-16 (×4): qty 2

## 2020-06-16 MED ORDER — ZIDOVUDINE 10 MG/ML IV SOLN
2.0000 mg/kg | Freq: Once | INTRAVENOUS | Status: AC
  Administered 2020-06-17: 223 mg via INTRAVENOUS
  Filled 2020-06-16: qty 22.3

## 2020-06-16 MED ORDER — TERBUTALINE SULFATE 1 MG/ML IJ SOLN: 0.2500 mg | Freq: Once | INTRAMUSCULAR | Status: DC | PRN

## 2020-06-16 MED ORDER — MAGNESIUM SULFATE BOLUS VIA INFUSION
4.0000 g | Freq: Once | INTRAVENOUS | Status: AC
Start: 2020-06-16 — End: 2020-06-16
  Administered 2020-06-16: 4 g via INTRAVENOUS
  Filled 2020-06-16: qty 1000

## 2020-06-16 MED ORDER — MAGNESIUM SULFATE 40 GM/1000ML IV SOLN
2.0000 g/h | INTRAVENOUS | Status: DC
  Administered 2020-06-17 – 2020-06-18 (×2): 2 g/h via INTRAVENOUS
  Filled 2020-06-16 (×3): qty 1000

## 2020-06-16 MED ORDER — FAMOTIDINE IN NACL 20-0.9 MG/50ML-% IV SOLN
20.0000 mg | Freq: Two times a day (BID) | INTRAVENOUS | Status: DC
  Administered 2020-06-16 – 2020-06-19 (×6): 20 mg via INTRAVENOUS
  Filled 2020-06-16 (×6): qty 50

## 2020-06-16 MED ORDER — LABETALOL HCL 5 MG/ML IV SOLN: 40.0000 mg | INTRAVENOUS | Status: DC | PRN

## 2020-06-16 MED ORDER — LABETALOL HCL 5 MG/ML IV SOLN
80.0000 mg | INTRAVENOUS | Status: DC | PRN
  Filled 2020-06-16: qty 16

## 2020-06-16 MED ORDER — LACTATED RINGERS IV SOLN
500.0000 mL | INTRAVENOUS | Status: DC | PRN
Start: 2020-06-16 — End: 2020-06-18

## 2020-06-16 MED ORDER — METFORMIN HCL 500 MG PO TABS: 1000.0000 mg | ORAL_TABLET | Freq: Every day | ORAL | Status: DC

## 2020-06-16 MED ORDER — LIDOCAINE HCL (PF) 1 % IJ SOLN: 30.0000 mL | INTRAMUSCULAR | Status: DC | PRN

## 2020-06-16 MED ORDER — HYDRALAZINE HCL 20 MG/ML IJ SOLN: 10.0000 mg | INTRAMUSCULAR | Status: DC | PRN

## 2020-06-16 MED ORDER — LACTATED RINGERS IV SOLN: INTRAVENOUS | Status: DC

## 2020-06-16 MED ORDER — LABETALOL HCL 5 MG/ML IV SOLN
INTRAVENOUS | Status: AC
  Filled 2020-06-16: qty 4

## 2020-06-16 MED ORDER — LABETALOL HCL 5 MG/ML IV SOLN
40.0000 mg | INTRAVENOUS | Status: DC | PRN
  Administered 2020-06-16 – 2020-06-18 (×2): 40 mg via INTRAVENOUS
  Filled 2020-06-16: qty 8

## 2020-06-16 MED ORDER — ONDANSETRON HCL 4 MG/2ML IJ SOLN
4.0000 mg | Freq: Four times a day (QID) | INTRAMUSCULAR | Status: DC | PRN
  Administered 2020-06-16 – 2020-06-17 (×2): 4 mg via INTRAVENOUS
  Filled 2020-06-16 (×2): qty 2

## 2020-06-16 MED ORDER — OXYTOCIN BOLUS FROM INFUSION: 333.0000 mL | Freq: Once | INTRAVENOUS | Status: DC

## 2020-06-16 MED ORDER — SOD CITRATE-CITRIC ACID 500-334 MG/5ML PO SOLN
30.0000 mL | ORAL | Status: DC | PRN
  Filled 2020-06-16: qty 15

## 2020-06-16 MED ORDER — MISOPROSTOL 25 MCG QUARTER TABLET: 25.0000 ug | ORAL_TABLET | ORAL | Status: DC | PRN

## 2020-06-16 NOTE — H&P (Signed)
OB ADMISSION/ HISTORY & PHYSICAL:  Admission Date: 06/16/2020 12:16 PM  Admit Diagnosis: Pre-eclampsia w/o severe features  Kelly Hunter is a 30 y.o. female G2P0010 [redacted]w[redacted]d presenting for IOL. Pt was seen in the office and BP was found to be 158/118, pt denies HA, visual changes, and RUQ pain. Pt was scheduled for midnight IOL, but recommended to report to L&D now for suspect pre-eclampsia. Endorses active FM, denies LOF and vaginal bleeding. Hx HIV pos, most recent viral load is undetectable, will repeat lab today and treat with AZT if there is a measureable viral load per infectious disease MD. Hx HSV, has been on suppressive therapy since 36 wks, denies recent outbreak and prodromal symptoms.   History of current pregnancy: Patient entered care with [facility] at 23 wks, tx from [facility].   G2P0010, EDC 06/24/20 by LMP congruent w/ 8 week [place]. HIV positive, dx 2015, followed by [facility], Type 2 DM dx 2017, HSV 2, VIN 1 2015 (treated with CO2, laser ablation of vulvar/cervical dysplasia/condylomata, Dr. Toney Rakes), HPV, depression/anxiety, hx substance abuse ([place]), asthma, hx chlamydia. Hx breast reduction, I&D hydradenitis lesions. On ASA, daily suppressive Valtrex, BMI 42. Trich 11/25/19 neg TOC in 3rd trimester. Anatomy US @ 20 wks--EFW 4%ile, anterior placenta  Patient Active Problem List   Diagnosis Date Noted  . IUGR (intrauterine growth restriction) affecting care of mother 06/16/2020  . HIV (human immunodeficiency virus) risk factors complicating pregnancy 32/95/1884  . Genital herpes 04/27/2016  . Type 2 diabetes mellitus ([place]) 11/17/2014  . Hidradenitis axillaris 05/21/2014  . HIV disease ([place]) 01/15/2014  . Eczema 10/22/2013  . Condyloma acuminatum of vulva 06/18/2013  . Dysplasia of cervix, low grade (CIN 1) 06/18/2013  . Maternal obesity affecting pregnancy, antepartum 03/30/2006  . ASTHMA, EXERCISE INDUCED 03/30/2006    Prenatal Labs: ABO, Rh: B/Positive/-- (10/21  0000) Antibody: Negative (10/21 0000) Rubella: Immune (10/21 0000)  RPR: NON-REACTIVE (04/06 1536)  HBsAg: Negative (10/21 0000)  HIV: Reactive (10/21 0000)  GTT: failed 3 hr, A1C 6.2 GBS:   neg GC/CHL: neg/neg Genetics: declined Tdap/influenza vaccines: flu UTD, declined tdap   OB History  Gravida Para Term Preterm AB Living  2 0 0 0 1 0  SAB IAB Ectopic Multiple Live Births  0 0 0 0 0    # Outcome Date GA Lbr Len/2nd Weight Sex Delivery Anes PTL Lv  2 Current           1 AB             Medical / Surgical History: Past medical history:  Past Medical History:  Diagnosis Date  . Acne   . Allergy    Allegra  . Asthma    mild; onset in childhood.  No hospitalizations.  . ASTHMA, EXERCISE INDUCED   . Cervical high risk HPV (human papillomavirus) test positive 07/2019   cervical cancer screening due in December, 2021  . Chlamydia 03/03/2012  . CIN I (cervical intraepithelial neoplasia I) 08/31/2013, 05/01/17   colposcopy by Toney Rakes; s/p CO2 laser treatment. repeat colposcopy by Dr. Quincy Simmonds.  . Eczema   . Elevated blood sugar 12/25/2017  . Genital warts 08/31/2013   Aldara treatment; Toney Rakes.  . Grieving 12/25/2017  . Herpes   . Hidradenitis   . Hidradenitis axillaris 05/21/2014  . HIV (human immunodeficiency virus infection) ([place])   . HIV (human immunodeficiency virus) risk factors complicating pregnancy 16/60/6301  . HSV-2 seropositive   . Migraine headache without aura   . Mixed  anxiety depressive disorder 06/16/2020  . Moderate episode of recurrent major depressive disorder ([place]) 10/24/2018  . Substance abuse ([place])   . Type 2 diabetes mellitus without complication, without long-term current use of insulin ([place]) 11/17/2014  . Vaccine counseling 01/22/2020  . VIN I (vulvar intraepithelial neoplasia I) 08/31/2013   s/p CO2 treatment; Fernandez/gyn.    Past surgical history:  Past Surgical History:  Procedure Laterality Date  . BREAST SURGERY  02/01/2008   Reduction  .  CO2 LASER APPLICATION N/A 0/10/9321   Procedure: C02 laser of vagina and cervix;  Surgeon: Terrance Mass, MD;  Location: [facility];  Service: Gynecology;  Laterality: N/A;  . INCISION AND DRAINAGE ABSCESS Left 03/09/2017   Procedure: INCISION AND DRAINAGE ABSCESS LEFT BREAST ABSCESS;  Surgeon: Erroll Luna, MD;  Location: [facility];  Service: General;  Laterality: Left;   Family History:  Family History  Problem Relation Age of Onset  . Diabetes Mother   . Hypertension Mother   . Hyperlipidemia Mother   . Mental illness Mother   . Pulmonary embolism Mother   . Cancer Maternal Grandfather        ?  Marland Kitchen Cancer Paternal Aunt        Cervical or Ovarian per patient    Social History:  reports that she has never smoked. She has never used smokeless tobacco. She reports previous alcohol use. She reports that she does not use drugs.  Allergies: Other, Peanut oil, Peanut-containing drug products, Pyridium [phenazopyridine hcl], and Bactrim [sulfamethoxazole-trimethoprim]   Current Medications at time of admission:  Prior to Admission medications   Medication Sig Start Date End Date Taking? Authorizing Provider  dolutegravir (TIVICAY) 50 MG tablet TAKE 1 TABLET (50 MG TOTAL) BY MOUTH DAILY. 11/25/19 11/24/20  Truman Hayward, MD  emtricitabine-tenofovir (TRUVADA) 200-300 MG tablet TAKE 1 TABLET BY MOUTH DAILY. 04/20/20 04/20/21  Truman Hayward, MD  Lancets (ONETOUCH DELICA PLUS [id number]) [place] Apply topically daily. 01/17/20   [provider]  metFORMIN (GLUCOPHAGE) 500 MG tablet TAKE 1 TABLET(500 MG) BY MOUTH TWICE DAILY WITH A MEAL 02/24/20   Marrian Salvage, FNP  ondansetron (ZOFRAN ODT) 8 MG disintegrating tablet Take 1 tablet (8 mg total) by mouth every 8 (eight) hours as needed for nausea or vomiting. 05/16/20   Rasch, Artist Pais, NP  Prenatal Vit-Fe Fumarate-FA (PRENATAL MULTIVITAMIN) TABS tablet Take 1 tablet by mouth daily at 12 noon.    [provider]   Triamcinolone Acetonide (TRIAMCINOLONE 0.1 % CREAM : EUCERIN) CREA Apply 1 application topically daily. As needed. 08/08/18   Marrian Salvage, FNP  valACYclovir (VALTREX) 1000 MG tablet TAKE 1 TABLET BY MOUTH ONCE A DAY 05/25/20 05/25/21  Tommy Medal, Lavell Islam, MD    Review of Systems: Constitutional: Negative   HENT: Negative   Eyes: Negative   Respiratory: Negative   Cardiovascular: Negative   Gastrointestinal: Negative  Genitourinary: neg for bloody show, neg for LOF   Musculoskeletal: Negative   Skin: Negative   Neurological: Negative   Endo/Heme/Allergies: Negative   Psychiatric/Behavioral: Negative    Physical Exam: VS: Blood pressure (!) 145/89, pulse (!) 113, temperature 98.5 F (36.9 C), temperature source Oral, resp. rate 15, height 5\' 3"  (1.6 m), weight 111.3 kg, last menstrual period 09/18/2019. AAO x3, no signs of distress Cardiovascular: RRR Respiratory: Lung fields clear to ausculation GU/GI: Abdomen gravid, non-tender, non-distended, active FM, vertex, EFW 6# per Leopold's Extremities: 1+ non-pitting edema, negative for pain, tenderness, and cords  Cervical exam: closed FHR: baseline rate 160 / variability moderate / accelerations present / absent decelerations TOCO: none   Prenatal Transfer Tool  Maternal Diabetes: Yes:  Diabetes Type:  Pre-pregnancy Genetic Screening: Declined Maternal Ultrasounds/Referrals: IUGR Fetal Ultrasounds or other Referrals:  Referred to Materal Fetal Medicine  Maternal Substance Abuse:  Yes:  Type: Marijuana Significant Maternal Medications:  Meds include: Other: Metformin, Glyburide, dolutegravir, Truvada, Valtrex Significant Maternal Lab Results: Group B Strep negative, HIV positive and Other: HSV positive    Assessment: 30 y.o. G2P0010 [redacted]w[redacted]d Pre-eclampsia w/o severe features    -CMP, protein creatinine ratio HIV pos w/ undetectable viral load    -repeat viral load today HSV     -spec exam negative, no prodromal  symptoms Type 2 DM    -CBG Q 4 hrs    -Continue Metformin 1G PO in AM and Glyburide 5mg  HS while eating, D/C when NPO IUGR    -4% FHR category 1 GBS negative Pain management plan: per pt's request   Plan:  Admit to L&D Induction of labor    -vertex confirmed w/ bedside US    -oral Cytotec 50 mcg buccal Q 4 hrs Routine admission orders Epidural PRN  Dr Alesia Richards aware of admission and plan of care  Arrie Eastern MSN, CNM 06/16/2020 1:45 PM

## 2020-06-16 NOTE — Progress Notes (Signed)
Called patient to come in for direct admit increased BP 10:03

## 2020-06-16 NOTE — Progress Notes (Addendum)
HIV-RNA viral load lab has not been resulted. RN called [facility] for ETA of result and was told that specimen takes days to result. Dr. Alesia Richards notified and plan to start AZT developed. CNM consulted w/ Dr. Linus Salmons of Infectious Disease, Dr. Alesia Richards, and the Pharmacist fpr AZT dosing. CNM to follow order set for dosing per [facility] protocol for AZT administration in labor.   Subjective:    Elevated BP, pt denies HA, visual changes, and RUQ pain. Discussed need for MgSO4 for seizure prophylaxis and pt agrees. Informed pt of the inability to obtain results of HIV viral load prior to delivery and the need for AZT and pt agrees to administration. Questions answered and plan of care reviewed.   Objective:    VS: BP (!) 156/102 (BP Location: Left Arm)   Pulse 99   Temp 97.8 F (36.6 C) (Axillary)   Resp 16   Ht 5\' 3"  (1.6 m)   Wt 111.3 kg   LMP 09/18/2019 (Exact Date)   BMI 43.45 kg/m  FHR : baseline 160 / variability moderate / accelerations absent / absent decelerations Toco: contractions irreg Membranes: intact Dilation: 1 Effacement (%): Thick Station: -3 Presentation: Vertex Exam by:: Clois Dupes, CNM   Assessment/Plan:   30 y.o. G2P0010 [redacted]w[redacted]d Pre-eclampsia w/o severe features    -Protein creatinine ratio 0.57    -severe range BP requiring IV Labetalol    -starting MgSO4 HIV pos w/ undetectable viral load    -repeat viral load will not be resulted during labor    -starting AZT per protocol and will continue to infuse until delivery HSV     -spec exam negative, no prodromal symptoms Type 2 DM    -CBG Q 4 hrs    -Continue Metformin 1G PO in AM and Glyburide 5mg  HS while eating, hold when NPO IUGR    -4% FHR category 1 GBS negative  Labor: Cytotec x3, will consider mechanical ripening at next VE Preeclampsia:  on magnesium sulfate and negative neuro S/S Fetal Wellbeing:  Category I Pain Control:  per pt's request I/D:  GBS neg Anticipated MOD:  NSVD  Arrie Eastern MSN,  CNM 06/16/2020 10:28 PM  MD Addendum: I reviewed chart and agree with above findings, assessment and plan as outlined above by Arrie Eastern MSN, CNM. IUGR  Resolved over the course of pregnancy. Last growth ultrasound at [redacted] weeks EGA showed an EFW of 6lbs 14 oz (92%).  My leopold EFW is 8 lbs 6 oz, pelvis feels adequate on exam. Waymon Amato, MD. 06/17/2020 06:40 AM.

## 2020-06-17 ENCOUNTER — Inpatient Hospital Stay (HOSPITAL_COMMUNITY): Payer: 59

## 2020-06-17 DIAGNOSIS — O1410 Severe pre-eclampsia, unspecified trimester: Secondary | ICD-10-CM | POA: Diagnosis not present

## 2020-06-17 DIAGNOSIS — Z3490 Encounter for supervision of normal pregnancy, unspecified, unspecified trimester: Secondary | ICD-10-CM | POA: Diagnosis present

## 2020-06-17 DIAGNOSIS — Z8659 Personal history of other mental and behavioral disorders: Secondary | ICD-10-CM

## 2020-06-17 HISTORY — DX: Severe pre-eclampsia, unspecified trimester: O14.10

## 2020-06-17 HISTORY — DX: Encounter for supervision of normal pregnancy, unspecified, unspecified trimester: Z34.90

## 2020-06-17 LAB — CBC WITH DIFFERENTIAL/PLATELET
Abs Immature Granulocytes: 0.06 10*3/uL (ref 0.00–0.07)
Abs Immature Granulocytes: 0.07 10*3/uL (ref 0.00–0.07)
Basophils Absolute: 0.1 10*3/uL (ref 0.0–0.1)
Basophils Absolute: 0 10*3/uL (ref 0.0–0.1)
Basophils Relative: 1 %
Basophils Relative: 0 %
Eosinophils Absolute: 0 10*3/uL (ref 0.0–0.5)
Eosinophils Absolute: 0 10*3/uL (ref 0.0–0.5)
Eosinophils Relative: 0 %
Eosinophils Relative: 0 %
HCT: 31.7 % — ABNORMAL LOW (ref 36.0–46.0)
HCT: 31.8 % — ABNORMAL LOW (ref 36.0–46.0)
Hemoglobin: 11.2 g/dL — ABNORMAL LOW (ref 12.0–15.0)
Hemoglobin: 11.2 g/dL — ABNORMAL LOW (ref 12.0–15.0)
Immature Granulocytes: 1 %
Immature Granulocytes: 1 %
Lymphocytes Relative: 23 %
Lymphocytes Relative: 14 %
Lymphs Abs: 2.9 10*3/uL (ref 0.7–4.0)
Lymphs Abs: 1.8 10*3/uL (ref 0.7–4.0)
MCH: 27.5 pg (ref 26.0–34.0)
MCH: 27.7 pg (ref 26.0–34.0)
MCHC: 35.3 g/dL (ref 30.0–36.0)
MCHC: 35.2 g/dL (ref 30.0–36.0)
MCV: 77.9 fL — ABNORMAL LOW (ref 80.0–100.0)
MCV: 78.5 fL — ABNORMAL LOW (ref 80.0–100.0)
Monocytes Absolute: 0.9 10*3/uL (ref 0.1–1.0)
Monocytes Absolute: 0.6 10*3/uL (ref 0.1–1.0)
Monocytes Relative: 7 %
Monocytes Relative: 5 %
Neutro Abs: 8.5 10*3/uL — ABNORMAL HIGH (ref 1.7–7.7)
Neutro Abs: 10 10*3/uL — ABNORMAL HIGH (ref 1.7–7.7)
Neutrophils Relative %: 68 %
Neutrophils Relative %: 80 %
Platelets: 373 10*3/uL (ref 150–400)
Platelets: 402 10*3/uL — ABNORMAL HIGH (ref 150–400)
RBC: 4.07 MIL/uL (ref 3.87–5.11)
RBC: 4.05 MIL/uL (ref 3.87–5.11)
RDW: 15 % (ref 11.5–15.5)
RDW: 15.3 % (ref 11.5–15.5)
WBC: 12.3 10*3/uL — ABNORMAL HIGH (ref 4.0–10.5)
WBC: 12.5 10*3/uL — ABNORMAL HIGH (ref 4.0–10.5)
nRBC: 0 % (ref 0.0–0.2)
nRBC: 0 % (ref 0.0–0.2)

## 2020-06-17 LAB — RPR: RPR Ser Ql: NONREACTIVE

## 2020-06-17 LAB — COMPREHENSIVE METABOLIC PANEL
ALT: 16 U/L (ref 0–44)
AST: 20 U/L (ref 15–41)
Albumin: 2.6 g/dL — ABNORMAL LOW (ref 3.5–5.0)
Alkaline Phosphatase: 129 U/L — ABNORMAL HIGH (ref 38–126)
Anion gap: 9 (ref 5–15)
BUN: 11 mg/dL (ref 6–20)
CO2: 21 mmol/L — ABNORMAL LOW (ref 22–32)
Calcium: 8.6 mg/dL — ABNORMAL LOW (ref 8.9–10.3)
Chloride: 105 mmol/L (ref 98–111)
Creatinine, Ser: 0.91 mg/dL (ref 0.44–1.00)
GFR, Estimated: >60 mL/min (ref >60)
Glucose, Bld: 93 mg/dL (ref 70–99)
Potassium: 3.9 mmol/L (ref 3.5–5.1)
Sodium: 135 mmol/L (ref 135–145)
Total Bilirubin: 0.3 mg/dL (ref 0.3–1.2)
Total Protein: 6.1 g/dL — ABNORMAL LOW (ref 6.5–8.1)

## 2020-06-17 LAB — GLUCOSE, CAPILLARY
Glucose-Capillary: 116 mg/dL — ABNORMAL HIGH (ref 70–99)
Glucose-Capillary: 84 mg/dL (ref 70–99)
Glucose-Capillary: 115 mg/dL — ABNORMAL HIGH (ref 70–99)
Glucose-Capillary: 121 mg/dL — ABNORMAL HIGH (ref 70–99)
Glucose-Capillary: 120 mg/dL — ABNORMAL HIGH (ref 70–99)

## 2020-06-17 LAB — HIV-1 RNA QUANT-NO REFLEX-BLD
HIV 1 RNA Quant: 50 copies/mL
LOG10 HIV-1 RNA: 1.699 log10copy/mL

## 2020-06-17 LAB — HIV-1/HIV-2 QUAL RNA
HIV-1 RNA, Qualitative: NONREACTIVE
HIV-2 RNA, Qualitative: NONREACTIVE

## 2020-06-17 LAB — GLUCOSE, RANDOM: Glucose, Bld: 134 mg/dL — ABNORMAL HIGH (ref 70–99)

## 2020-06-17 LAB — MAGNESIUM
Magnesium: 4.6 mg/dL — ABNORMAL HIGH (ref 1.7–2.4)
Magnesium: 5.4 mg/dL — ABNORMAL HIGH (ref 1.7–2.4)

## 2020-06-17 MED ORDER — PHENYLEPHRINE 40 MCG/ML (10ML) SYRINGE FOR IV PUSH (FOR BLOOD PRESSURE SUPPORT): 80.0000 ug | PREFILLED_SYRINGE | INTRAVENOUS | Status: DC | PRN

## 2020-06-17 MED ORDER — FENTANYL-BUPIVACAINE-NACL 0.5-0.125-0.9 MG/250ML-% EP SOLN
12.0000 mL/h | EPIDURAL | Status: DC | PRN
  Filled 2020-06-17: qty 250

## 2020-06-17 MED ORDER — TERBUTALINE SULFATE 1 MG/ML IJ SOLN: 0.2500 mg | Freq: Once | INTRAMUSCULAR | Status: DC | PRN

## 2020-06-17 MED ORDER — EPHEDRINE 5 MG/ML INJ: 10.0000 mg | INTRAVENOUS | Status: DC | PRN

## 2020-06-17 MED ORDER — LACTATED RINGERS IV SOLN: 500.0000 mL | Freq: Once | INTRAVENOUS | Status: DC

## 2020-06-17 MED ORDER — OXYTOCIN-SODIUM CHLORIDE 30-0.9 UT/500ML-% IV SOLN
1.0000 m[IU]/min | INTRAVENOUS | Status: DC
  Administered 2020-06-17 – 2020-06-18 (×2): 2 m[IU]/min via INTRAVENOUS
  Administered 2020-06-18: 4 m[IU]/min via INTRAVENOUS
  Filled 2020-06-17: qty 500

## 2020-06-17 MED ORDER — DIPHENHYDRAMINE HCL 50 MG/ML IJ SOLN: 12.5000 mg | INTRAMUSCULAR | Status: DC | PRN

## 2020-06-17 NOTE — Progress Notes (Signed)
Subjective:    Discussed mechanical cervical ripening and pt agrees.    Objective:    VS: BP (!) 149/106   Pulse 95   Temp 97.9 F (36.6 C) (Oral)   Resp 18   Ht 5\' 3"  (1.6 m)   Wt 111.3 kg   LMP 09/18/2019 (Exact Date)   BMI 43.45 kg/m  FHR : baseline 145 / variability moderate / accelerations present / absent decelerations Toco: contractions every 2-4.5 minutes  Membranes: intact Dilation: 2 Effacement (%): 50 Cervical Position: Posterior Station: -3 Presentation: Vertex Exam by:: Burman Foster, CNM   Assessment/Plan:   30 y.o. G2P0010 [redacted]w[redacted]d Pre-eclampsia w/o severe features -Protein creatinine ratio 0.57    -severe range BP requiring IV Labetalol    -S/P MgSO4 4G bolus, now on 2G/hr continuous HIV pos w/ undetectable viral load -repeat viral load will not be resulted during labor    -AZT 223 mg loading dose, now 27.8 mL/hr continuous until delivery HSV  -spec exam negative, no prodromal symptoms Type 2 DM -CBG Q 4 hrs -Continue Metformin 1G PO in AM and Glyburide 5mg  HS while eating, hold when NPO IUGR -resolved   Labor: S/P Cytotec x4 doses, cervical balloon inserted w/o diff 80 ml uterine and 60 ml vaginal Preeclampsia:  on magnesium sulfate, no signs or symptoms of toxicity, intake and ouput balanced and mag level Q 8 hrs, forst level @ 0800 Fetal Wellbeing:  Category I Pain Control:  per pt's request I/D:  GBS neg Anticipated MOD:  NSVD  Arrie Eastern MSN, CNM 06/17/2020 6:48 AM

## 2020-06-17 NOTE — Progress Notes (Signed)
Labor Progress Note  CLOTEE SCHLICKER is a 30 y.o. female currently 15 weeks, G2P0010 admitted on 5/17 at [redacted]w[redacted]d for IOL for PreE with SF, SR BP on magnesium, HIV+ on AZT, undetected viral load in April 2022, HSV+ no lesions on valtrex, DM2, CBG stable on po metformin and glyburide, h/o anxiety/depression no meds, mood stable, progressing in latent labor s/p cytotex, and progressing on pitocin and foley bulb.   Subjective: Pt stable resting in bed, tolerate cxt with IV pain meds.  Denies questions, foley bulb still in place.  Patient Active Problem List   Diagnosis Date Noted  . Encounter for induction of labor 06/17/2020  . Severe preeclampsia 06/17/2020  . H/O anxiety disorder 06/17/2020  . H/O: depression 06/17/2020  . HIV (human immunodeficiency virus) risk factors complicating pregnancy 70/62/3762  . Genital herpes 04/27/2016  . Type 2 diabetes mellitus ([place]) 11/17/2014  . HIV disease ([place]) 01/15/2014  . Eczema 10/22/2013  . Condyloma acuminatum of vulva 06/18/2013  . Dysplasia of cervix, low grade (CIN 1) 06/18/2013  . Maternal obesity affecting pregnancy, antepartum 03/30/2006  . ASTHMA, EXERCISE INDUCED 03/30/2006   Objective: BP (!) 141/88   Pulse (!) 103   Temp 98 F (36.7 C) (Oral)   Resp 18   Ht 5\' 3"  (1.6 m)   Wt 111.3 kg   LMP 09/18/2019 (Exact Date)   BMI 43.45 kg/m  I/O last 3 completed shifts: In: 3552.2 [P.O.:1062; I.V.:2317.5; IV Piggyback:172.7] Out: 4600 [Urine:4600] Total I/O In: 1014.1 [I.V.:1014.1] Out: 8315 [Urine:4050] NST: FHR baseline 140 bpm, Variability: moderate, Accelerations:present, Decelerations:  Absent= Cat 1/Reactive CTX:  irregular, every 2-3 minutes, lasting 40-50 seconds Uterus gravid, soft non tender, moderate to palpate with contractions.  SVE:  Dilation: 3 Effacement (%): 80 Station: -3 Exam by:: J.Leily Capek, CNM Pitocin at 16 mUn/min Bloody chow Foley bulb in place.   Assessment:  TORREY HORSEMAN is a 30 y.o. female  currently 31 weeks, G2P0010 admitted on 5/17 at [redacted]w[redacted]d for IOL for PreE with SF, SR BP on magnesium, HIV+ on AZT, undetected viral load in April 2022, HSV+ no lesions on valtrex, DM2, CBG stable on po metformin and glyburide, h/o anxiety/depression no meds, mood stable, progressing in latent labor s/p cytotex, and progressing on pitocin and foley bulb.  Patient Active Problem List   Diagnosis Date Noted  . Encounter for induction of labor 06/17/2020  . Severe preeclampsia 06/17/2020  . H/O anxiety disorder 06/17/2020  . H/O: depression 06/17/2020  . HIV (human immunodeficiency virus) risk factors complicating pregnancy 17/61/6073  . Genital herpes 04/27/2016  . Type 2 diabetes mellitus ([place]) 11/17/2014  . HIV disease ([place]) 01/15/2014  . Eczema 10/22/2013  . Condyloma acuminatum of vulva 06/18/2013  . Dysplasia of cervix, low grade (CIN 1) 06/18/2013  . Maternal obesity affecting pregnancy, antepartum 03/30/2006  . ASTHMA, EXERCISE INDUCED 03/30/2006   NICHD: Category 1  Membranes:  Intact, no s/s of infection  Induction:    Cytotec x @ 7106, 1813, 2694 on 5/17 and 0310 on 5/18  Foley Bulb: inserted  @0630  on 5/18  Pitocin - 6  Pain management:               IV pain management: x fentanyl on 5/17 @ 0751, 1419             Epidural placement: Plans when in more pain, anesthesia alerted, CBC redrawn.   GBS negative  A1GDM: stable sugars, asymptomatic, on glyburide at bedtime 5mg  PO, and metformin 1000mg   PO in the mornings.  CBG (last 3)  Recent Labs    06/17/20 0534 06/17/20 0822 06/17/20 1221  GLUCAP 84 115* 121*   PreE with SF: BP 141/98, stable, asymptomatic, required IV labetalol on 5/17 and 5/18, s/p 4gm loading dose  mag, continues on 2gm/hr, admitting PCR was 0.57, creatinine was 1.07 on 5/17, decreased on 5/18  to 0.91, I&O balanced, fluid max at 133ml/hr, pt has exercise induced asthma, but no bronchospasm  and no meds regularly the choice to use labetalol protocol is  more beneficial at this point then  hydralazine due to increased creatinine. Other labs unremarkable. Mag level 5/18 was 4.6.  H/O Anxiety/Depression: No med,s mood stable, denies SI/HI.   HSV+: On valtrex since 36 weeks, no prodromal s/sx, no active lesion.   Plan: Continue labor plan HSV: Monitor for lesion. Continue valtrex HIV: Pending viral load, continue universal precaution with AZT 400mg  continuous at 27.47mL/hr. NO FSE notes placed on the door, avoid IUPC. Will avoid AROM if possible to decrease fetal transmittion. Research shows [facility] for 2 mins is benaificial and not contraindicated.  PreE with SF: monitor BP, labetalol protocol for SR BP, continue continuous mag at 2gm/hr, fluid max at 150mg /hr, redraw cmp, cbc, mag level now. Monitor strict I&O's. Plan to continue magnesium x24 hours PP. DM2: low carb, .Q4H BS until active labor then Q2H, continue metformin 1000mg  mornings and glyburide 5mg  nightly.  H/O Anxiety/Depression: Discussing starting SSRI PP, monitor mood, EPDS PP.  Continuous monitoring Rest Ambulate Frequent position changes to facilitate fetal rotation and descent. Will reassess with cervical exam at  4 hours or earlier if necessary Continue pitocin per protocol 2x2 Anticipate labor progression and vaginal delivery.   Noralyn Pick, NP-C, CNM, MSN 06/17/2020. 4:16 PM

## 2020-06-17 NOTE — Progress Notes (Signed)
Labor Progress Note  Kelly Hunter is a 30 y.o. female currently 42 weeks, G2P0010 admitted on 5/17 at [redacted]w[redacted]d for IOL for PreE with SF, SR BP on magnesium, HIV+ on AZT, undetected viral load in April 2022, HSV+ no lesions on valtrex, DM2, CBG stable on po metformin and glyburide, h/o anxiety/depression no meds, mood stable, progressing in latent labor s/p cytotex, and prodromal early labor on pitocin and foley bulb.   Subjective: Pt stable sleeping through cxt with support cousin at bedside.  Patient Active Problem List   Diagnosis Date Noted  . Encounter for induction of labor 06/17/2020  . Severe preeclampsia 06/17/2020  . H/O anxiety disorder 06/17/2020  . H/O: depression 06/17/2020  . HIV (human immunodeficiency virus) risk factors complicating pregnancy 02/54/2706  . Genital herpes 04/27/2016  . Type 2 diabetes mellitus ([place]) 11/17/2014  . HIV disease ([place]) 01/15/2014  . Eczema 10/22/2013  . Condyloma acuminatum of vulva 06/18/2013  . Dysplasia of cervix, low grade (CIN 1) 06/18/2013  . Maternal obesity affecting pregnancy, antepartum 03/30/2006  . ASTHMA, EXERCISE INDUCED 03/30/2006   Objective: BP (!) 147/95   Pulse (!) 108   Temp 98.4 F (36.9 C) (Oral)   Resp 18   Ht 5\' 3"  (1.6 m)   Wt 111.3 kg   LMP 09/18/2019 (Exact Date)   BMI 43.45 kg/m  I/O last 3 completed shifts: In: 4566.3 [P.O.:1062; I.V.:3331.6; IV Piggyback:172.7] Out: 10050 [Urine:10050] Total I/O In: 800.4 [I.V.:750.4; IV Piggyback:50] Out: 450 [Urine:450] NST: FHR baseline 150 bpm, Variability: moderate, Accelerations:present, Decelerations:  Absent= Cat 1/Reactive CTX:  irregular, every 2-3 minutes, lasting 40-50 seconds Uterus gravid, soft non tender, moderate to palpate with contractions.  SVE:  Dilation: 3 Effacement (%): 80 Station: -3 Exam by:: J. Samarra Ridgely, CNM Pitocin at 22 mUn/min Bloody show Foley bulb in place.   Assessment:  Kelly Hunter is a 30 y.o. female currently 45  weeks, G2P0010 admitted on 5/17 at [redacted]w[redacted]d for IOL for PreE with SF, SR BP on magnesium, HIV+ on AZT, undetected viral load in April 2022, HSV+ no lesions on valtrex, DM2, CBG stable on po metformin and glyburide, h/o anxiety/depression no meds, mood stable, progressing in latent labor s/p cytotex, and prodromal early labor on pitocin and foley bulb.  Patient Active Problem List   Diagnosis Date Noted  . Encounter for induction of labor 06/17/2020  . Severe preeclampsia 06/17/2020  . H/O anxiety disorder 06/17/2020  . H/O: depression 06/17/2020  . HIV (human immunodeficiency virus) risk factors complicating pregnancy 23/76/2831  . Genital herpes 04/27/2016  . Type 2 diabetes mellitus ([place]) 11/17/2014  . HIV disease ([place]) 01/15/2014  . Eczema 10/22/2013  . Condyloma acuminatum of vulva 06/18/2013  . Dysplasia of cervix, low grade (CIN 1) 06/18/2013  . Maternal obesity affecting pregnancy, antepartum 03/30/2006  . ASTHMA, EXERCISE INDUCED 03/30/2006   NICHD: Category 1  Membranes:  Intact, no s/s of infection  Induction:    Cytotec x @ 5176, 1813, 1607 on 5/17 and 0310 on 5/18  Foley Bulb: inserted  @0630  on 5/18  Pitocin - 22  Pain management:               IV pain management: x fentanyl on 5/18 @ 0751, 1419, 1820             Epidural placement: Plans when in more pain, anesthesia alerted, CBC redrawn.   GBS negative  A1GDM: stable sugars, asymptomatic, on glyburide at bedtime 5mg  PO, and metformin 1000mg  PO  in the mornings.  CBG (last 3)  Recent Labs    06/17/20 0822 06/17/20 1221 06/17/20 2000  GLUCAP 115* 121* 120*   PreE with SF: BP 134/89, stable, asymptomatic, required IV labetalol on 5/17 and 5/18, s/p 4gm loading dose  mag, continues on 2gm/hr, admitting PCR was 0.57, creatinine was 1.07 on 5/17, decreased on 5/18  to 0.91, I&O balanced, fluid max at 176ml/hr, pt has exercise induced asthma, but no bronchospasm  and no meds regularly the choice to use labetalol protocol  is more beneficial at this point then  hydralazine due to increased creatinine. Other labs unremarkable. Mag level 5/18 was 4.6.  H/O Anxiety/Depression: No med,s mood stable, denies SI/HI.   HSV+: On valtrex since 36 weeks, no prodromal s/sx, no active lesion.   Plan: Continue labor plan HSV: Monitor for lesion. Continue valtrex HIV: Pending viral load, continue universal precaution with AZT 400mg  continuous at 27.72mL/hr. NO FSE notes placed on the door, avoid IUPC. Will avoid AROM if possible to decrease fetal transmittion. Research shows [facility] for 2 mins is benaificial and not contraindicated.  PreE with SF: monitor BP, labetalol protocol for SR BP, continue continuous mag at 2gm/hr, fluid max at 150mg /hr, redraw cmp, cbc, mag level now. Monitor strict I&O's. Plan to continue magnesium x24 hours PP. DM2: low carb, .Q4H BS until active labor then Q2H, continue metformin 1000mg  mornings and glyburide 5mg  nightly.  H/O Anxiety/Depression: Discussing starting SSRI PP, monitor mood, EPDS PP.  Continuous monitoring Rest Ambulate Frequent position changes to facilitate fetal rotation and descent. Will reassess with cervical exam at  4 hours or earlier if necessary Hold pitocin per protocol at 22 until foley bulb out then increase pitocin according to cxt and fetal tolerance by 2x2 Anticipate foley bulb out in 4 hours.  Anticipate labor progression and vaginal delivery.   Noralyn Pick, NP-C, CNM, MSN 06/17/2020. 10:32 PM

## 2020-06-17 NOTE — Progress Notes (Signed)
Labor Progress Note  Kelly Hunter is a 30 y.o. female currently 26 weeks, G2P0010 admitted on 5/17 at [redacted]w[redacted]d for IOL for PreE with SF, SR BP on magnesium, HIV+ on AZT, undetected viral load in April 2022, HSV+ no lesions on valtrex, DM2, CBG stable on po metformin and glyburide, h/o anxiety/depression no meds, mood stable, progressing in latent labor s/p cytotex, and progressing on pitocin and foley bulb.   Subjective: I assumed care for pt in NAD. Pt stable and asymptomatic, denies s/sx of hypo/hyperglycemia, denies HA, RUQ pain or vision changes, pt endorses feeling painful cxt, took IV pain med,s and discussed pain meds options, pt desires epidural when cxt worsen. Reviewed POC and proceeding with pitocin with foley bulbs, Reviewed R/B/A , pt consented. Discussed no FSE, avoiding IUPC and AROM to lower HIV transmitting, pt aware. [place] found to be safe according to research and WHO, plan to performed after delivery.  Patient Active Problem List   Diagnosis Date Noted  . Encounter for induction of labor 06/17/2020  . Severe preeclampsia 06/17/2020  . H/O anxiety disorder 06/17/2020  . H/O: depression 06/17/2020  . HIV (human immunodeficiency virus) risk factors complicating pregnancy 98/33/8250  . Genital herpes 04/27/2016  . Type 2 diabetes mellitus ([place]) 11/17/2014  . HIV disease ([place]) 01/15/2014  . Eczema 10/22/2013  . Condyloma acuminatum of vulva 06/18/2013  . Dysplasia of cervix, low grade (CIN 1) 06/18/2013  . Maternal obesity affecting pregnancy, antepartum 03/30/2006  . ASTHMA, EXERCISE INDUCED 03/30/2006   Objective: BP (!) 144/89   Pulse 97   Temp 98 F (36.7 C) (Oral)   Resp 18   Ht 5\' 3"  (1.6 m)   Wt 111.3 kg   LMP 09/18/2019 (Exact Date)   BMI 43.45 kg/m  I/O last 3 completed shifts: In: 3552.2 [P.O.:1062; I.V.:2317.5; IV Piggyback:172.7] Out: 4600 [Urine:4600] Total I/O In: -  Out: 1050 [Urine:1050] NST: FHR baseline 140 bpm, Variability: moderate,  Accelerations:present, Decelerations:  Absent= Cat 1/Reactive CTX:  irregular, every 2-5 minutes, lasting 40-50 seconds Uterus gravid, soft non tender, moderate to palpate with contractions.  SVE:  Dilation: 2 Effacement (%): 50 Station: -3 Exam by:: Burman Foster, CNM Pitocin at 6 mUn/min  Assessment:  Kelly Hunter is a 30 y.o. female currently 67 weeks, G2P0010 admitted on 5/17 at [redacted]w[redacted]d for IOL for PreE with SF, SR BP on magnesium, HIV+ on AZT, undetected viral load in April 2022, HSV+ no lesions on valtrex, DM2, CBG stable on po metformin and glyburide, h/o anxiety/depression no meds, mood stable, progressing in latent labor s/p cytotex, and progressing on pitocin and foley bulb.  Patient Active Problem List   Diagnosis Date Noted  . Encounter for induction of labor 06/17/2020  . Severe preeclampsia 06/17/2020  . H/O anxiety disorder 06/17/2020  . H/O: depression 06/17/2020  . HIV (human immunodeficiency virus) risk factors complicating pregnancy 53/97/6734  . Genital herpes 04/27/2016  . Type 2 diabetes mellitus ([place]) 11/17/2014  . HIV disease ([place]) 01/15/2014  . Eczema 10/22/2013  . Condyloma acuminatum of vulva 06/18/2013  . Dysplasia of cervix, low grade (CIN 1) 06/18/2013  . Maternal obesity affecting pregnancy, antepartum 03/30/2006  . ASTHMA, EXERCISE INDUCED 03/30/2006   NICHD: Category 1  Membranes:  Intact, no s/s of infection  Induction:    Cytotec x @ 1937, 1813, 2246 on 5/17 and 0310 on 5/18  Foley Bulb: inserted  @0630  on 5/18  Pitocin - 6  Pain management:  IV pain management: x fentanyl on 5/17 @ 0751             Epidural placement: Plans when in more pain, anesthesia alerted, CBC redrawn.   GBS negative  A1GDM: stable sugars, asymptomatic, on glyburide at bedtime 5mg  PO, and metformin 1000mg  PO in the mornings.  CBG (last 3)  Recent Labs    06/17/20 0158 06/17/20 0534 06/17/20 0822  GLUCAP 116* 84 115*   PreE with SF: BP 141/92,  stable, asymptomatic, required IV labetalol on 5/17 and 5/18, s/p 4gm loading dose  mag, continues on 2gm/hr, admitting PCR was 0.57, creatinine was 1.07 on 5/17, I&O balanced, fluid  max at 113ml/hr, pt has exercise induced asthma, but no bronchospasm and no meds regularly the  choice to use labetalol protocol is more beneficial at this point then hydralazine due to increased  creatinine. Other labs unremarkable.   H/O Anxiety/Depression: No med,s mood stable, denies SI/HI.   HSV+: On valtrex since 36 weeks, no prodromal s/sx, no active lesion.   Plan: Continue labor plan HSV: Monitor for lesion. Continue valtrex HIV: Pending viral load, continue universal precaution with AZT 400mg  continuous at 27.71mL/hr. NO FSE notes placed on the door, avoid IUPC. Will avoid AROM if possible to decrease fetal transmittion. Research shows [facility] for 2 mins is benaificial and not contraindicated.  PreE with SF: monitor BP, labetalol protocol for SR BP, continue continuous mag at 2gm/hr, fluid max at 150mg /hr, redraw cmp, cbc, mag level now. Monitor strict I&O's. Plan to continue magnesium x24 hours PP. DM2: low carb, .Q4H BS until active labor then Q2H, continue metformin 1000mg  mornings and glyburide 5mg  nightly.  H/O Anxiety/Depression: Discussing starting SSRI PP, monitor mood, EPDS PP.  Continuous monitoring Rest Ambulate Frequent position changes to facilitate fetal rotation and descent. Will reassess with cervical exam at  4 hours or earlier if necessary Continue pitocin per protocol 2x2 Anticipate labor progression and vaginal delivery.   Md Dillard aware of plan and verbalized agreement.   Noralyn Pick, NP-C, CNM, MSN 06/17/2020. 9:55 AM

## 2020-06-17 NOTE — Progress Notes (Signed)
Labor Progress Note  Kelly Hunter is a 30 y.o. female currently 57 weeks, G2P0010 admitted on 5/17 at [redacted]w[redacted]d for IOL for PreE with SF, SR BP on magnesium, HIV+ on AZT, undetected viral load in April 2022, HSV+ no lesions on valtrex, DM2, CBG stable on po metformin and glyburide, h/o anxiety/depression no meds, mood stable, progressing in latent labor s/p cytotex, and prodromal early labor on pitocin and foley bulb.   Subjective: Pt stable resting in bed, tolerate cxt with IV pain meds.  Denies questions, foley bulb still in place. Mother and cousin at bedside supportive.  Patient Active Problem List   Diagnosis Date Noted  . Encounter for induction of labor 06/17/2020  . Severe preeclampsia 06/17/2020  . H/O anxiety disorder 06/17/2020  . H/O: depression 06/17/2020  . HIV (human immunodeficiency virus) risk factors complicating pregnancy 01/60/1093  . Genital herpes 04/27/2016  . Type 2 diabetes mellitus ([place]) 11/17/2014  . HIV disease ([place]) 01/15/2014  . Eczema 10/22/2013  . Condyloma acuminatum of vulva 06/18/2013  . Dysplasia of cervix, low grade (CIN 1) 06/18/2013  . Maternal obesity affecting pregnancy, antepartum 03/30/2006  . ASTHMA, EXERCISE INDUCED 03/30/2006   Objective: BP 134/89   Pulse (!) 104   Temp 98.4 F (36.9 C) (Oral)   Resp 18   Ht 5\' 3"  (1.6 m)   Wt 111.3 kg   LMP 09/18/2019 (Exact Date)   BMI 43.45 kg/m  I/O last 3 completed shifts: In: 4566.3 [P.O.:1062; I.V.:3331.6; IV Piggyback:172.7] Out: 8650 [Urine:8650] No intake/output data recorded. NST: FHR baseline 150 bpm, Variability: moderate, Accelerations:present, Decelerations:  Absent= Cat 1/Reactive CTX:  irregular, every 2-3 minutes, lasting 40-50 seconds Uterus gravid, soft non tender, moderate to palpate with contractions.  SVE:  Dilation: 3 Effacement (%): 80 Station: -3 Exam by:: J. Denaja Verhoeven, CNM Pitocin at 22 mUn/min Bloody show Foley bulb in place.   Assessment:  Kelly Hunter  is a 30 y.o. female currently 86 weeks, G2P0010 admitted on 5/17 at [redacted]w[redacted]d for IOL for PreE with SF, SR BP on magnesium, HIV+ on AZT, undetected viral load in April 2022, HSV+ no lesions on valtrex, DM2, CBG stable on po metformin and glyburide, h/o anxiety/depression no meds, mood stable, progressing in latent labor s/p cytotex, and prodromal early labor on pitocin and foley bulb.  Patient Active Problem List   Diagnosis Date Noted  . Encounter for induction of labor 06/17/2020  . Severe preeclampsia 06/17/2020  . H/O anxiety disorder 06/17/2020  . H/O: depression 06/17/2020  . HIV (human immunodeficiency virus) risk factors complicating pregnancy 23/55/7322  . Genital herpes 04/27/2016  . Type 2 diabetes mellitus ([place]) 11/17/2014  . HIV disease ([place]) 01/15/2014  . Eczema 10/22/2013  . Condyloma acuminatum of vulva 06/18/2013  . Dysplasia of cervix, low grade (CIN 1) 06/18/2013  . Maternal obesity affecting pregnancy, antepartum 03/30/2006  . ASTHMA, EXERCISE INDUCED 03/30/2006   NICHD: Category 1  Membranes:  Intact, no s/s of infection  Induction:    Cytotec x @ 0254, 1813, 2706 on 5/17 and 0310 on 5/18  Foley Bulb: inserted  @0630  on 5/18  Pitocin - 22  Pain management:               IV pain management: x fentanyl on 5/18 @ 0751, 1419, 1820             Epidural placement: Plans when in more pain, anesthesia alerted, CBC redrawn.   GBS negative  A1GDM: stable sugars, asymptomatic, on glyburide  at bedtime 5mg  PO, and metformin 1000mg  PO in the mornings.  CBG (last 3)  Recent Labs    06/17/20 0534 06/17/20 0822 06/17/20 1221  GLUCAP 84 115* 121*   PreE with SF: BP 134/89, stable, asymptomatic, required IV labetalol on 5/17 and 5/18, s/p 4gm loading dose  mag, continues on 2gm/hr, admitting PCR was 0.57, creatinine was 1.07 on 5/17, decreased on 5/18  to 0.91, I&O balanced, fluid max at 154ml/hr, pt has exercise induced asthma, but no bronchospasm  and no meds regularly the  choice to use labetalol protocol is more beneficial at this point then  hydralazine due to increased creatinine. Other labs unremarkable. Mag level 5/18 was 4.6.  H/O Anxiety/Depression: No med,s mood stable, denies SI/HI.   HSV+: On valtrex since 36 weeks, no prodromal s/sx, no active lesion.   Plan: Continue labor plan HSV: Monitor for lesion. Continue valtrex HIV: Pending viral load, continue universal precaution with AZT 400mg  continuous at 27.89mL/hr. NO FSE notes placed on the door, avoid IUPC. Will avoid AROM if possible to decrease fetal transmittion. Research shows [facility] for 2 mins is benaificial and not contraindicated.  PreE with SF: monitor BP, labetalol protocol for SR BP, continue continuous mag at 2gm/hr, fluid max at 150mg /hr, redraw cmp, cbc, mag level now. Monitor strict I&O's. Plan to continue magnesium x24 hours PP. DM2: low carb, .Q4H BS until active labor then Q2H, continue metformin 1000mg  mornings and glyburide 5mg  nightly.  H/O Anxiety/Depression: Discussing starting SSRI PP, monitor mood, EPDS PP.  Continuous monitoring Rest Ambulate Frequent position changes to facilitate fetal rotation and descent. Will reassess with cervical exam at  4 hours or earlier if necessary Hold pitocin per protocol at 22 until foley bulb out then increase pitocin according to cxt and fetal tolerance by 2x2 Anticipate foley bulb out in 4 hours.  Anticipate labor progression and vaginal delivery.   Noralyn Pick, NP-C, CNM, MSN 06/17/2020. 7:04 PM

## 2020-06-18 ENCOUNTER — Inpatient Hospital Stay (HOSPITAL_COMMUNITY): Payer: 59 | Admitting: Anesthesiology

## 2020-06-18 ENCOUNTER — Other Ambulatory Visit (HOSPITAL_COMMUNITY): Payer: Self-pay

## 2020-06-18 ENCOUNTER — Encounter (HOSPITAL_COMMUNITY)
Admission: AD | Disposition: A | Discharge status: Home / Self Care | Payer: Self-pay | Source: Home / Self Care | Attending: Obstetrics & Gynecology

## 2020-06-18 ENCOUNTER — Encounter (HOSPITAL_COMMUNITY): Payer: Self-pay | Admitting: Anesthesiology

## 2020-06-18 LAB — GLUCOSE, CAPILLARY
Glucose-Capillary: 101 mg/dL — ABNORMAL HIGH (ref 70–99)
Glucose-Capillary: 108 mg/dL — ABNORMAL HIGH (ref 70–99)
Glucose-Capillary: 119 mg/dL — ABNORMAL HIGH (ref 70–99)
Glucose-Capillary: 126 mg/dL — ABNORMAL HIGH (ref 70–99)
Glucose-Capillary: 129 mg/dL — ABNORMAL HIGH (ref 70–99)
Glucose-Capillary: 135 mg/dL — ABNORMAL HIGH (ref 70–99)

## 2020-06-18 LAB — MAGNESIUM
Magnesium: 6.5 mg/dL (ref 1.7–2.4)
Magnesium: 6.5 mg/dL (ref 1.7–2.4)
Magnesium: 7.4 mg/dL (ref 1.7–2.4)

## 2020-06-18 LAB — CBC
HCT: 34 % — ABNORMAL LOW (ref 36.0–46.0)
Hemoglobin: 11.8 g/dL — ABNORMAL LOW (ref 12.0–15.0)
MCH: 27.1 pg (ref 26.0–34.0)
MCHC: 34.7 g/dL (ref 30.0–36.0)
MCV: 78 fL — ABNORMAL LOW (ref 80.0–100.0)
Platelets: 376 10*3/uL (ref 150–400)
RBC: 4.36 MIL/uL (ref 3.87–5.11)
RDW: 15.4 % (ref 11.5–15.5)
WBC: 9.2 10*3/uL (ref 4.0–10.5)
nRBC: 0 % (ref 0.0–0.2)

## 2020-06-18 SURGERY — Surgical Case
Anesthesia: Spinal

## 2020-06-18 MED ORDER — SENNOSIDES-DOCUSATE SODIUM 8.6-50 MG PO TABS
2.0000 | ORAL_TABLET | Freq: Every day | ORAL | Status: DC
  Administered 2020-06-20 – 2020-06-21 (×2): 2 via ORAL
  Filled 2020-06-18 (×2): qty 2

## 2020-06-18 MED ORDER — KETOROLAC TROMETHAMINE 30 MG/ML IJ SOLN: 30.0000 mg | Freq: Once | INTRAMUSCULAR | Status: DC

## 2020-06-18 MED ORDER — MORPHINE SULFATE (PF) 0.5 MG/ML IJ SOLN
INTRAMUSCULAR | Status: AC
  Filled 2020-06-18: qty 10

## 2020-06-18 MED ORDER — MENTHOL 3 MG MT LOZG: 1.0000 | LOZENGE | OROMUCOSAL | Status: DC | PRN

## 2020-06-18 MED ORDER — NALBUPHINE HCL 10 MG/ML IJ SOLN: 5.0000 mg | INTRAMUSCULAR | Status: DC | PRN

## 2020-06-18 MED ORDER — DIPHENHYDRAMINE HCL 25 MG PO CAPS: 25.0000 mg | ORAL_CAPSULE | Freq: Four times a day (QID) | ORAL | Status: DC | PRN

## 2020-06-18 MED ORDER — ONDANSETRON HCL 4 MG/2ML IJ SOLN
INTRAMUSCULAR | Status: AC
  Filled 2020-06-18: qty 2

## 2020-06-18 MED ORDER — KETOROLAC TROMETHAMINE 30 MG/ML IJ SOLN: 30.0000 mg | Freq: Four times a day (QID) | INTRAMUSCULAR | Status: DC | PRN

## 2020-06-18 MED ORDER — NALBUPHINE HCL 10 MG/ML IJ SOLN: 5.0000 mg | Freq: Once | INTRAMUSCULAR | Status: DC | PRN

## 2020-06-18 MED ORDER — WITCH HAZEL-GLYCERIN EX PADS: 1.0000 "application " | MEDICATED_PAD | CUTANEOUS | Status: DC | PRN

## 2020-06-18 MED ORDER — MAGNESIUM SULFATE 40 GM/1000ML IV SOLN
1.0000 g/h | INTRAVENOUS | Status: AC
  Administered 2020-06-19: 2 g/h via INTRAVENOUS
  Filled 2020-06-18: qty 1000

## 2020-06-18 MED ORDER — PHENYLEPHRINE HCL-NACL 20-0.9 MG/250ML-% IV SOLN
INTRAVENOUS | Status: AC
  Filled 2020-06-18: qty 250

## 2020-06-18 MED ORDER — FENTANYL CITRATE (PF) 100 MCG/2ML IJ SOLN: 100.0000 ug | Freq: Once | INTRAMUSCULAR | Status: DC

## 2020-06-18 MED ORDER — NALOXONE HCL 0.4 MG/ML IJ SOLN: 0.4000 mg | INTRAMUSCULAR | Status: DC | PRN

## 2020-06-18 MED ORDER — ENOXAPARIN SODIUM 60 MG/0.6ML IJ SOSY
0.5000 mg/kg | PREFILLED_SYRINGE | INTRAMUSCULAR | Status: DC
  Administered 2020-06-19 – 2020-06-20 (×2): 55 mg via SUBCUTANEOUS
  Filled 2020-06-18 (×2): qty 0.6

## 2020-06-18 MED ORDER — BUPIVACAINE IN DEXTROSE 0.75-8.25 % IT SOLN
INTRATHECAL | Status: DC | PRN
  Administered 2020-06-18: 1.6 mL via INTRATHECAL

## 2020-06-18 MED ORDER — CEFAZOLIN SODIUM-DEXTROSE 2-3 GM-%(50ML) IV SOLR
INTRAVENOUS | Status: DC | PRN
  Administered 2020-06-18: 2 g via INTRAVENOUS

## 2020-06-18 MED ORDER — SODIUM CHLORIDE 0.9% FLUSH: 3.0000 mL | INTRAVENOUS | Status: DC | PRN

## 2020-06-18 MED ORDER — ACETAMINOPHEN 500 MG PO TABS
1000.0000 mg | ORAL_TABLET | Freq: Four times a day (QID) | ORAL | Status: DC
  Administered 2020-06-19 – 2020-06-21 (×9): 1000 mg via ORAL
  Filled 2020-06-18 (×9): qty 2

## 2020-06-18 MED ORDER — DIPHENHYDRAMINE HCL 50 MG/ML IJ SOLN: 12.5000 mg | INTRAMUSCULAR | Status: DC | PRN

## 2020-06-18 MED ORDER — KETOROLAC TROMETHAMINE 30 MG/ML IJ SOLN
30.0000 mg | Freq: Four times a day (QID) | INTRAMUSCULAR | Status: AC
  Administered 2020-06-19 (×4): 30 mg via INTRAVENOUS
  Filled 2020-06-18 (×4): qty 1

## 2020-06-18 MED ORDER — FENTANYL CITRATE (PF) 100 MCG/2ML IJ SOLN
INTRAMUSCULAR | Status: DC | PRN
  Administered 2020-06-18: 15 ug via INTRATHECAL

## 2020-06-18 MED ORDER — SIMETHICONE 80 MG PO CHEW: 80.0000 mg | CHEWABLE_TABLET | ORAL | Status: DC | PRN

## 2020-06-18 MED ORDER — FENTANYL CITRATE (PF) 100 MCG/2ML IJ SOLN
INTRAMUSCULAR | Status: AC
  Filled 2020-06-18: qty 2

## 2020-06-18 MED ORDER — HYDRALAZINE HCL 20 MG/ML IJ SOLN: 10.0000 mg | INTRAMUSCULAR | Status: DC | PRN

## 2020-06-18 MED ORDER — COCONUT OIL OIL: 1.0000 "application " | TOPICAL_OIL | Status: DC | PRN

## 2020-06-18 MED ORDER — NALOXONE HCL 4 MG/10ML IJ SOLN
1.0000 ug/kg/h | INTRAVENOUS | Status: DC | PRN
  Filled 2020-06-18: qty 5

## 2020-06-18 MED ORDER — LACTATED RINGERS IV SOLN: INTRAVENOUS | Status: DC

## 2020-06-18 MED ORDER — PHENYLEPHRINE HCL-NACL 20-0.9 MG/250ML-% IV SOLN
INTRAVENOUS | Status: DC | PRN
  Administered 2020-06-18: 60 ug/min via INTRAVENOUS

## 2020-06-18 MED ORDER — ONDANSETRON HCL 4 MG/2ML IJ SOLN
4.0000 mg | Freq: Three times a day (TID) | INTRAMUSCULAR | Status: DC | PRN
  Administered 2020-06-19 (×2): 4 mg via INTRAVENOUS
  Filled 2020-06-18 (×2): qty 2

## 2020-06-18 MED ORDER — ZOLPIDEM TARTRATE 5 MG PO TABS: 5.0000 mg | ORAL_TABLET | Freq: Every evening | ORAL | Status: DC | PRN

## 2020-06-18 MED ORDER — TETANUS-DIPHTH-ACELL PERTUSSIS 5-2.5-18.5 LF-MCG/0.5 IM SUSY: 0.5000 mL | PREFILLED_SYRINGE | Freq: Once | INTRAMUSCULAR | Status: DC

## 2020-06-18 MED ORDER — PRENATAL MULTIVITAMIN CH
1.0000 | ORAL_TABLET | Freq: Every day | ORAL | Status: DC
  Administered 2020-06-20: 1 via ORAL
  Filled 2020-06-18: qty 1

## 2020-06-18 MED ORDER — OXYCODONE HCL 5 MG PO TABS: 5.0000 mg | ORAL_TABLET | ORAL | Status: DC | PRN

## 2020-06-18 MED ORDER — FENTANYL CITRATE (PF) 100 MCG/2ML IJ SOLN: 25.0000 ug | INTRAMUSCULAR | Status: DC | PRN

## 2020-06-18 MED ORDER — MEPERIDINE HCL 25 MG/ML IJ SOLN: 6.2500 mg | INTRAMUSCULAR | Status: DC | PRN

## 2020-06-18 MED ORDER — DIPHENHYDRAMINE HCL 25 MG PO CAPS: 25.0000 mg | ORAL_CAPSULE | ORAL | Status: DC | PRN

## 2020-06-18 MED ORDER — BUPIVACAINE HCL (PF) 0.5 % IJ SOLN
INTRAMUSCULAR | Status: AC
  Filled 2020-06-18: qty 30

## 2020-06-18 MED ORDER — ONDANSETRON HCL 4 MG/2ML IJ SOLN
INTRAMUSCULAR | Status: DC | PRN
  Administered 2020-06-18: 4 mg via INTRAVENOUS

## 2020-06-18 MED ORDER — LACTATED RINGERS IV SOLN: INTRAVENOUS | Status: DC | PRN

## 2020-06-18 MED ORDER — BUPIVACAINE HCL 0.5 % IJ SOLN
INTRAMUSCULAR | Status: DC | PRN
  Administered 2020-06-18: 30 mL

## 2020-06-18 MED ORDER — SOD CITRATE-CITRIC ACID 500-334 MG/5ML PO SOLN
30.0000 mL | ORAL | Status: AC
  Administered 2020-06-18: 30 mL via ORAL

## 2020-06-18 MED ORDER — LABETALOL HCL 5 MG/ML IV SOLN: 40.0000 mg | INTRAVENOUS | Status: DC | PRN

## 2020-06-18 MED ORDER — SIMETHICONE 80 MG PO CHEW
80.0000 mg | CHEWABLE_TABLET | Freq: Three times a day (TID) | ORAL | Status: DC
  Administered 2020-06-19 – 2020-06-20 (×4): 80 mg via ORAL
  Filled 2020-06-18 (×4): qty 1

## 2020-06-18 MED ORDER — CEFAZOLIN SODIUM-DEXTROSE 2-4 GM/100ML-% IV SOLN: 2.0000 g | INTRAVENOUS | Status: DC

## 2020-06-18 MED ORDER — OXYTOCIN-SODIUM CHLORIDE 30-0.9 UT/500ML-% IV SOLN: 2.5000 [IU]/h | INTRAVENOUS | Status: AC

## 2020-06-18 MED ORDER — MORPHINE SULFATE (PF) 0.5 MG/ML IJ SOLN
INTRAMUSCULAR | Status: DC | PRN
  Administered 2020-06-18: .15 mg via INTRATHECAL

## 2020-06-18 MED ORDER — OXYTOCIN-SODIUM CHLORIDE 30-0.9 UT/500ML-% IV SOLN
INTRAVENOUS | Status: DC | PRN
  Administered 2020-06-18: 30 [IU] via INTRAVENOUS

## 2020-06-18 MED ORDER — LABETALOL HCL 5 MG/ML IV SOLN: 80.0000 mg | INTRAVENOUS | Status: DC | PRN

## 2020-06-18 MED ORDER — DIBUCAINE (PERIANAL) 1 % EX OINT: 1.0000 "application " | TOPICAL_OINTMENT | CUTANEOUS | Status: DC | PRN

## 2020-06-18 MED ORDER — LABETALOL HCL 5 MG/ML IV SOLN
20.0000 mg | INTRAVENOUS | Status: DC | PRN
  Administered 2020-06-19: 20 mg via INTRAVENOUS

## 2020-06-18 MED ORDER — IBUPROFEN 600 MG PO TABS
600.0000 mg | ORAL_TABLET | Freq: Four times a day (QID) | ORAL | Status: DC
  Administered 2020-06-19 – 2020-06-21 (×6): 600 mg via ORAL
  Filled 2020-06-18 (×6): qty 1

## 2020-06-18 SURGICAL SUPPLY — 39 items
APL SKNCLS STERI-STRIP NONHPOA (GAUZE/BANDAGES/DRESSINGS) ×1
BENZOIN TINCTURE PRP APPL 2/3 (GAUZE/BANDAGES/DRESSINGS) ×2 IMPLANT
CHLORAPREP W/TINT 26ML (MISCELLANEOUS) ×2 IMPLANT
CLAMP CORD UMBIL (MISCELLANEOUS) IMPLANT
CLOSURE STERI-STRIP 1/4X4 (GAUZE/BANDAGES/DRESSINGS) ×2 IMPLANT
CLOTH BEACON ORANGE TIMEOUT ST (SAFETY) ×2 IMPLANT
CLSR STERI-STRIP ANTIMIC 1/2X4 (GAUZE/BANDAGES/DRESSINGS) ×2 IMPLANT
DRSG OPSITE POSTOP 4X10 (GAUZE/BANDAGES/DRESSINGS) ×2 IMPLANT
ELECT REM PT RETURN 9FT ADLT (ELECTROSURGICAL) ×2
ELECTRODE REM PT RTRN 9FT ADLT (ELECTROSURGICAL) ×1 IMPLANT
EXTRACTOR VACUUM KIWI (MISCELLANEOUS) IMPLANT
GLOVE BIO SURGEON STRL SZ 6.5 (GLOVE) ×2 IMPLANT
GLOVE BIOGEL PI IND STRL 7.0 (GLOVE) ×2 IMPLANT
GLOVE BIOGEL PI INDICATOR 7.0 (GLOVE) ×2
GOWN STRL REUS W/TWL LRG LVL3 (GOWN DISPOSABLE) ×6 IMPLANT
KIT ABG SYR 3ML LUER SLIP (SYRINGE) IMPLANT
NEEDLE HYPO 22GX1.5 SAFETY (NEEDLE) IMPLANT
NEEDLE HYPO 25X5/8 SAFETYGLIDE (NEEDLE) IMPLANT
NS IRRIG 1000ML POUR BTL (IV SOLUTION) ×2 IMPLANT
PACK C SECTION WH (CUSTOM PROCEDURE TRAY) ×2 IMPLANT
PAD ABD 8X10 STRL (GAUZE/BANDAGES/DRESSINGS) ×2 IMPLANT
PAD OB MATERNITY 4.3X12.25 (PERSONAL CARE ITEMS) ×2 IMPLANT
PENCIL SMOKE EVAC W/HOLSTER (ELECTROSURGICAL) ×2 IMPLANT
RTRCTR C-SECT PINK 25CM LRG (MISCELLANEOUS) ×2 IMPLANT
STRIP CLOSURE SKIN 1/2X4 (GAUZE/BANDAGES/DRESSINGS) ×2 IMPLANT
SUT CHROMIC 2 0 CT 1 (SUTURE) ×4 IMPLANT
SUT MNCRL 0 VIOLET CTX 36 (SUTURE) ×2 IMPLANT
SUT MONOCRYL 0 CTX 36 (SUTURE) ×4
SUT PDS AB 0 CTX 60 (SUTURE) ×2 IMPLANT
SUT PLAIN 2 0 (SUTURE)
SUT PLAIN 2 0 XLH (SUTURE) ×2 IMPLANT
SUT PLAIN ABS 2-0 CT1 27XMFL (SUTURE) IMPLANT
SUT VIC AB 0 CTX 36 (SUTURE) ×4
SUT VIC AB 0 CTX36XBRD ANBCTRL (SUTURE) ×2 IMPLANT
SUT VIC AB 4-0 KS 27 (SUTURE) ×2 IMPLANT
SYR CONTROL 10ML LL (SYRINGE) IMPLANT
TOWEL OR 17X24 6PK STRL BLUE (TOWEL DISPOSABLE) ×2 IMPLANT
TRAY FOLEY W/BAG SLVR 14FR LF (SET/KITS/TRAYS/PACK) ×2 IMPLANT
WATER STERILE IRR 1000ML POUR (IV SOLUTION) ×2 IMPLANT

## 2020-06-18 NOTE — Progress Notes (Signed)
Subjective:    SVE remains unchanged. CAT 1. Pitocin on 45mu. [name] is requesting a cesarean section. She states she has just had enough and does not want to continue the IOL.   Objective:    VS: BP (!) 154/109   Pulse 94   Temp 98.8 F (37.1 C) (Oral)   Resp 18   Ht 5\' 3"  (1.6 m)   Wt 111.3 kg   LMP 09/18/2019 (Exact Date)   BMI 43.45 kg/m  FHR : baseline 140 / variability moderate / accelerations present / absent decelerations Toco: contractions irregular Membranes: intact Dilation: 5 Effacement (%): 70 Cervical Position: Posterior Station: -3 Presentation: Vertex Exam by:: K.Tavaris Eudy Pitocin 22 mU/min  Assessment/Plan:   30 y.o. G2P0010 [redacted]w[redacted]d Kelly T Jenkinsis a 30 y.o.femalecurrently 39 weeks, G2P0010 admitted on 5/17 at [redacted]w[redacted]d for IOL for PreE with SF, SR BP on magnesium, HIV+ on AZT, undetected viral load in April 2022, HSV+ no lesions on valtrex, DM2, CBG stable on po metformin and glyburide, h/o anxiety/depression no meds, mood stable, progressing in latent labor s/p cytotexand foley bulb, latentlabor on pitocin. Pt requesting primary cesarean.   Labor: latent labor Preeclampsia:  on magnesium sulfate Fetal Wellbeing:  Category I Pain Control:  Spinal for cesarean I/D:  GBS negative Anticipated MOD:  Primary cesarean section   Dr Alwyn Pea notified of pt status and [place] MSN, CNM 06/18/2020 4:28 PM

## 2020-06-18 NOTE — Anesthesia Procedure Notes (Signed)
Spinal  Patient location during procedure: OR Start time: 06/18/2020 8:13 PM End time: 06/18/2020 8:15 PM Reason for block: surgical anesthesia Staffing Performed: anesthesiologist  Anesthesiologist: Josephine Igo, MD Preanesthetic Checklist Completed: patient identified, IV checked, site marked, risks and benefits discussed, surgical consent, monitors and equipment checked, pre-op evaluation and timeout performed Spinal Block Patient position: sitting Prep: DuraPrep and site prepped and draped Patient monitoring: heart rate, cardiac monitor, continuous pulse ox and blood pressure Approach: midline Location: L3-4 Injection technique: single-shot Needle Needle type: Pencan  Needle gauge: 24 G Needle length: 9 cm Needle insertion depth: 7 cm Assessment Sensory level: T4 Events: CSF return Additional Notes Patient tolerated procedure well. Adequate sensory level.

## 2020-06-18 NOTE — Transfer of Care (Signed)
Immediate Anesthesia Transfer of Care Note  Patient: Kelly Hunter  Procedure(s) Performed: CESAREAN SECTION (N/A )  Patient Location: PACU  Anesthesia Type:Spinal  Level of Consciousness: awake  Airway & Oxygen Therapy: Patient Spontanous Breathing  Post-op Assessment: Report given to RN and Post -op Vital signs reviewed and stable  Post vital signs: Reviewed and stable  Last Vitals:  Vitals Value Taken Time  BP 114/69 06/18/20 2138  Temp    Pulse 76 06/18/20 2143  Resp 22 06/18/20 2143  SpO2 100 % 06/18/20 2143  Vitals shown include unvalidated device data.  Last Pain:  Vitals:   06/18/20 1731  TempSrc: Oral  PainSc:          Complications: No complications documented.

## 2020-06-18 NOTE — Progress Notes (Signed)
MD Pre operative note  Kelly Hunter is a 30 y.o. G2P0010 at [redacted]w[redacted]d by LMP admitted for induction of labor due to pre-eclampsia with severe features, type 2 DM.  Induction began on May 17th, patient has not progressed into active labor.  She now is requesting a primary cesarean section.   Objective: BP (!) 147/87   Pulse 93   Temp 98.4 F (36.9 C) (Oral)   Resp 18   Ht 5\' 3"  (1.6 m)   Wt 111.3 kg   LMP 09/18/2019 (Exact Date)   BMI 43.45 kg/m  I/O last 3 completed shifts: In: 1607 [P.O.:2162; I.V.:5304.3; IV Piggyback:272.7] Out: 14300 [Urine:14300] Total I/O In: 146.2 [I.V.:146.2] Out: 1300 [Urine:1300]  FHT:  FHR: 145 bpm, variability: moderate,  accelerations:  Present,  decelerations:  Absent UC:   irregular, every 6 minutes SVE:   Dilation: 5 Effacement (%): 70 Station: -3 Exam by:: [name]  Labs: Lab Results  Component Value Date   WBC 12.5 (H) 06/17/2020   HGB 11.2 (L) 06/17/2020   HCT 31.8 (L) 06/17/2020   MCV 78.5 (L) 06/17/2020   PLT 402 (H) 06/17/2020    Assessment / Plan: Patient is s/p induction of labor for Type 2 diabetes, pre-eclampsia with severe features, who desires to stop the process of induction and proceed with a primary cesarean section. Discussed risks of procedure with patient to include hemorrhage, infection, injury to internal organs, or the baby.  She voiced understanding and agrees to proceed.    Kelly Hunter 06/18/2020, 6:45 PM

## 2020-06-18 NOTE — Progress Notes (Signed)
Patient requesting that Dr. Alwyn Pea be contacted and patient would prefer a c-section at this time.

## 2020-06-18 NOTE — Anesthesia Postprocedure Evaluation (Signed)
Anesthesia Post Note  Patient: Kelly Hunter  Procedure(s) Performed: CESAREAN SECTION (N/A )     Patient location during evaluation: PACU Anesthesia Type: Spinal Level of consciousness: oriented and awake and alert Pain management: pain level controlled Vital Signs Assessment: post-procedure vital signs reviewed and stable Respiratory status: spontaneous breathing, respiratory function stable and nonlabored ventilation Cardiovascular status: blood pressure returned to baseline and stable Postop Assessment: no headache, no backache, no apparent nausea or vomiting, spinal receding and patient able to bend at knees Anesthetic complications: no   No complications documented.  Last Vitals:  Vitals:   06/18/20 2200 06/18/20 2215  BP: 117/86 129/87  Pulse: 79 79  Resp: 18 15  Temp:    SpO2: 100% 100%    Last Pain:  Vitals:   06/18/20 2215  TempSrc:   PainSc: 0-No pain   Pain Goal:    LLE Motor Response: Purposeful movement (06/18/20 2215) LLE Sensation: Numbness (06/18/20 2215) RLE Motor Response: Purposeful movement (06/18/20 2215) RLE Sensation: Numbness (06/18/20 2215)     Epidural/Spinal Function Cutaneous sensation: Tingles (06/18/20 2215), Patient able to flex knees: No (06/18/20 2215), Patient able to lift hips off bed: No (06/18/20 2215), Back pain beyond tenderness at insertion site: No (06/18/20 2215), Progressively worsening motor and/or sensory loss: No (06/18/20 2215), Bowel and/or bladder incontinence post epidural: No (06/18/20 2215)  Raihan Kimmel A.

## 2020-06-18 NOTE — Progress Notes (Signed)
Labor Progress Note  Kelly Hunter is a 30 y.o. female currently 32 weeks, G2P0010 admitted on 5/17 at [redacted]w[redacted]d for IOL for PreE with SF, SR BP on magnesium, HIV+ on AZT, undetected viral load in April 2022, HSV+ no lesions on valtrex, DM2, CBG stable on po metformin and glyburide, h/o anxiety/depression no meds, mood stable, progressing in latent labor s/p cytotex and foley bulb, and progressing in latent labor on pitocin.   Subjective: Pt stable sleeping woke for exam, denies questions. Denies HA, RUA pain or vision changes.  Patient Active Problem List   Diagnosis Date Noted  . Encounter for induction of labor 06/17/2020  . Severe preeclampsia 06/17/2020  . H/O anxiety disorder 06/17/2020  . H/O: depression 06/17/2020  . HIV (human immunodeficiency virus) risk factors complicating pregnancy 96/05/5407  . Genital herpes 04/27/2016  . Type 2 diabetes mellitus ([place]) 11/17/2014  . HIV disease ([place]) 01/15/2014  . Eczema 10/22/2013  . Condyloma acuminatum of vulva 06/18/2013  . Dysplasia of cervix, low grade (CIN 1) 06/18/2013  . Maternal obesity affecting pregnancy, antepartum 03/30/2006  . ASTHMA, EXERCISE INDUCED 03/30/2006   Objective: BP (!) 141/89   Pulse 99   Temp 98.4 F (36.9 C) (Oral)   Resp 18   Ht 5\' 3"  (1.6 m)   Wt 111.3 kg   LMP 09/18/2019 (Exact Date)   BMI 43.45 kg/m  I/O last 3 completed shifts: In: 4566.3 [P.O.:1062; I.V.:3331.6; IV Piggyback:172.7] Out: 10050 [Urine:10050] Total I/O In: 3172.7 [P.O.:1100; I.V.:1972.7; IV Piggyback:100] Out: 4150 [Urine:4150] NST: FHR baseline 150 bpm, Variability: moderate, Accelerations:present, Decelerations:  Absent= Cat 1/Reactive CTX:  irregular, every 2-3 minutes, lasting 40-50 seconds Uterus gravid, soft non tender, moderate to palpate with contractions.  SVE:  Dilation: 4.5 Effacement (%): 80 Station: -3 Exam by:: Ola Spurr, RN Pitocin at 26 mUn/min Bloody show  Assessment:  Kelly Hunter is a 30 y.o.  female currently 8 weeks, G2P0010 admitted on 5/17 at [redacted]w[redacted]d for IOL for PreE with SF, SR BP on magnesium, HIV+ on AZT, undetected viral load in April 2022, HSV+ no lesions on valtrex, DM2, CBG stable on po metformin and glyburide, h/o anxiety/depression no meds, mood stable, progressing in latent labor s/p cytotex and foley bulb, and progressing in latent labor on pitocin Patient Active Problem List   Diagnosis Date Noted  . Encounter for induction of labor 06/17/2020  . Severe preeclampsia 06/17/2020  . H/O anxiety disorder 06/17/2020  . H/O: depression 06/17/2020  . HIV (human immunodeficiency virus) risk factors complicating pregnancy 81/19/1478  . Genital herpes 04/27/2016  . Type 2 diabetes mellitus ([place]) 11/17/2014  . HIV disease ([place]) 01/15/2014  . Eczema 10/22/2013  . Condyloma acuminatum of vulva 06/18/2013  . Dysplasia of cervix, low grade (CIN 1) 06/18/2013  . Maternal obesity affecting pregnancy, antepartum 03/30/2006  . ASTHMA, EXERCISE INDUCED 03/30/2006   NICHD: Category 1  Membranes:  Intact, no s/s of infection  Induction:    Cytotec x @ 2956, 1813, 2130 on 5/17 and 0310 on 5/18  Foley Bulb: inserted  @0630  on 5/18, out at 0415 on 5/19  Pitocin - 26  Pain management:               IV pain management: x fentanyl on 5/18 @ 0751, 1419, 1820             Epidural placement: Plans when in more pain, anesthesia alerted, CBC to be redrawn.   GBS negative  A1GDM: stable sugars, asymptomatic, on glyburide  at bedtime 5mg  PO, and metformin 1000mg  PO in the mornings.  CBG (last 3)  Recent Labs    06/17/20 2000 06/18/20 0004 06/18/20 0405  GLUCAP 120* 126* 108*   PreE with SF: BP 141/89, stable, asymptomatic, required IV labetalol on 5/17 and 5/18, s/p 4gm loading dose  mag, continues on 2gm/hr, admitting PCR was 0.57, creatinine was 1.07 on 5/17, decreased on 5/18  to 0.91, I&O balanced, fluid max at 122ml/hr, pt has exercise induced asthma, but no bronchospasm  and no  meds regularly the choice to use labetalol protocol is more beneficial at this point then  hydralazine due to increased creatinine. Other labs unremarkable. Mag level 5/18 was 4.6.  H/O Anxiety/Depression: No med,s mood stable, denies SI/HI.   HSV+: On valtrex since 36 weeks, no prodromal s/sx, no active lesion.   Plan: Continue labor plan HSV: Monitor for lesion. Continue valtrex HIV: Pending viral load, continue universal precaution with AZT 400mg  continuous at 27.30mL/hr. NO FSE notes placed on the door, avoid IUPC. Will avoid AROM if possible to decrease fetal transmittion. Research shows [facility] for 2 mins is benaificial and not contraindicated.  PreE with SF: monitor BP, labetalol protocol for SR BP, continue continuous mag at 2gm/hr, fluid max at 150mg /hr, redraw cmp, cbc, mag level now. Monitor strict I&O's. Plan to continue magnesium x24 hours PP. DM2: low carb, .Q4H BS until active labor then Q2H, continue metformin 1000mg  mornings and glyburide 5mg  nightly.  H/O Anxiety/Depression: Discussing starting SSRI PP, monitor mood, EPDS PP.  Continuous monitoring Rest Ambulate Frequent position changes to facilitate fetal rotation and descent. Will reassess with cervical exam at  4 hours or earlier if necessary Continue pitocin per protocol at 2x2 Anticipate labor progression and vaginal delivery.   Noralyn Pick, NP-C, CNM, MSN 06/18/2020. 6:44 AM

## 2020-06-18 NOTE — Progress Notes (Signed)
MD at bedside discussing delivery options with patient at this time. Orders given to stopped all IV medications and monitoring at this time and allow patient into the shower. Will restart IV medications after shower.

## 2020-06-18 NOTE — Op Note (Signed)
Cesarean Section Procedure Note  Kelly Hunter  DOB:    Jul 27, 1990  MRN:    [id number]  Date of Surgery:  06/18/2020  Indication: 59 week SIUP admitted for induction taken back to operating room at maternal request   Pre-operative Diagnosis: 1. 39 week SIUP 2. Type 2 diabetes complicating pregnancy 3.  Pre-eclampsia with severe features 4. Maternal request for primary cesarean section  Post-operative Diagnosis: same  Procedure:  Primary cesarean delivery                         Surgeon: Caffie Damme, MD  Assistants: Wilford Sports, CNM  Anesthesia: Spinal anesthesia  ASA Class: 2  Procedure Details   The patient was counseled about the risks, benefits, complications of the cesarean section. The patient concurred with the proposed plan, giving informed consent.  The site of surgery properly noted/marked. The patient was taken to Operating Room B, identified as Kelly Hunter and the procedure verified as C-Section Delivery. A Time Out was held and the above information confirmed.  After spinal was found to adequate , the patient was placed in the dorsal supine position with a leftward tilt, draped and prepped in the usual sterile manner. A Pfannenstiel incision was made with a 10 blade scalpel and the incision carried down through the subcutaneous tissue to the fascia.  The fascia was incised in the midline and the fascial incision was extended laterally with Mayo scissors. The superior aspect of the fascial incision was grasped with Coker clamps x2, tented up and the rectus muscles dissected off sharply with the bovie.  The rectus was then dissected off with blunt dissection and the bovie inferiorly. The rectus muscles were separated in the midline. The abdominal peritoneum was identified, and bluntly entered using surgeons fingers. The peritoneal opening was bluntly extended with gentle pulling.   The Alexis retractor was then deployed. The vesicouterine peritoneum was  identified, tented up, entered sharply with Metzenbaum scissors, and the bladder flap was created digitally. Scalpel was then used to make a low transverse incision on the uterus which was extended laterally with  blunt dissection. The fetal vertex was identified and delivered from cephalic presentation.  A live healthy Female with Apgar scores of 7 at one minute and 8 at five minutes. The baby had poor tone so the umbilical cord was quickly clamped and cut, the baby was handed off to waiting Pediatricians. Cord gases (venous) and cord blood was obtained for evaluation. The placenta was spontaneously removed intact. The placenta was handed off to be held for the mother.   The uterus was cleared of all clot and debris. The uterine incision was repaired with #0 Monocryl in running locked fashion. A second imbricating suture was performed using the same suture. The incision was hemostatic. Ovaries and tubes were inspected and normal. The Alexis retractor was removed. The abdominal cavity was cleared of all clot and debris. The abdominal peritoneum was reapproximated with 2-0 chromic  in a running fashion, the rectus muscles was reapproximated with #2 chromic in interrupted fashion.The fascia was closed with 0 Vicryl in a running fashion  The subcuticular layer was irrigated and all bleeders cauterized.  30 mL of 0.5% Marcaine was injected into the subcutaneous layer.  The Scarpas fascia was re-approximated with interrupted sutures of 2-0 plain.   The skin was closed with 4-0 vicryl in a subcuticular fashion using a Lanny Hurst needle. The incision was dressed with benzoine, steri strips, honeycomb  and pressure dressing. All sponge lap and needle counts were correct x3.   Patient tolerated the procedure well and recovered in stable condition following the procedure.  Instrument, sponge, and needle counts were correct prior the abdominal closure and at the conclusion of the case.   Findings: Live female infant, Apgars  7/8, clear amniotic fluid, placenta normal 3 vessels, fibroid uterus, normal bilateral tubes and ovaries  Estimated Blood Loss: 142 mL  IVF:  1250 mL LR         Drains: Foley catheter  Urine output: 50  mL clear         Specimens: Placenta to mother         Implants: none         Complications:  None; patient tolerated the procedure well.         Disposition: PACU - hemodynamically stable.  Attending Attestation: I PERFORMED THE PROCEDURE AND MY ASSISTANT WAS NEEDED FOR THE COMPLEXITY OF THE CASE   Alwyn Pea, Andrez Grime

## 2020-06-18 NOTE — Anesthesia Preprocedure Evaluation (Addendum)
Anesthesia Evaluation  Patient identified by MRN, date of birth, ID band Patient awake    Reviewed: Allergy & Precautions, NPO status , Patient's Chart, lab work & pertinent test results, reviewed documented beta blocker date and time   Airway Mallampati: III  TM Distance: >3 FB Neck ROM: Full    Dental no notable dental hx. (+) Teeth Intact, Dental Advisory Given   Pulmonary asthma ,    Pulmonary exam normal breath sounds clear to auscultation       Cardiovascular hypertension, Pt. on medications and Pt. on home beta blockers Normal cardiovascular exam Rhythm:Regular Rate:Normal     Neuro/Psych  Headaches, PSYCHIATRIC DISORDERS Anxiety Depression  Neuromuscular disease    GI/Hepatic Neg liver ROS, GERD  Medicated and Controlled,Takes Tums   Endo/Other  diabetes, Well Controlled, Type 2, Oral Hypoglycemic AgentsMorbid obesity  Renal/GU negative Renal ROS  negative genitourinary   Musculoskeletal negative musculoskeletal ROS (+)   Abdominal (+) + obese,   Peds  Hematology  (+) anemia , HIV,   Anesthesia Other Findings   Reproductive/Obstetrics (+) Pregnancy Severe pre Eclampsia- on MgSO4 and Labetalol HSV Condyloma  39 1/7 weeks                            Anesthesia Physical Anesthesia Plan  ASA: III  Anesthesia Plan: Spinal   Post-op Pain Management:    Induction: Intravenous  PONV Risk Score and Plan: 4 or greater and Treatment may vary due to age or medical condition and Scopolamine patch - Pre-op  Airway Management Planned: Natural Airway  Additional Equipment:   Intra-op Plan:   Post-operative Plan:   Informed Consent: I have reviewed the patients History and Physical, chart, labs and discussed the procedure including the risks, benefits and alternatives for the proposed anesthesia with the patient or authorized representative who has indicated his/her understanding and  acceptance.     Dental advisory given  Plan Discussed with: CRNA and Anesthesiologist  Anesthesia Plan Comments:         Anesthesia Quick Evaluation

## 2020-06-18 NOTE — Progress Notes (Signed)
Kelly T Jenkinsis a 30 y.o.femalecurrently 39 weeks, G2P0010 admitted on 5/17 at [redacted]w[redacted]d for IOL for PreE with SF, SR BP on magnesium, HIV+ on AZT, undetected viral load in April 2022, HSV+ no lesions on valtrex, DM2, CBG stable on po metformin and glyburide, h/o anxiety/depression no meds, mood stable, progressing in latent labor s/p cytotex and foley bulb, latent labor on pitocin.   Subjective:    Kelly Hunter is resting comfortable. No complaints of pain. Requesting to eat and shower. Discussed pitocin break. Will resume after she eats. SVE 4-5/60/-3. CAT 1 tracing.   Objective:    VS: BP (!) 156/104   Pulse 98   Temp 98.6 F (37 C) (Oral)   Resp 18   Ht 5\' 3"  (1.6 m)   Wt 111.3 kg   LMP 09/18/2019 (Exact Date)   BMI 43.45 kg/m  FHR : baseline 150 / variability moderate / accelerations present / absent decelerations Toco: contractions every 2-3 minutes  Membranes: intact Dilation: 4.5 Effacement (%): 80 Cervical Position: Posterior Station: -3 Presentation: Vertex Exam by:: K. Nero Sawatzky, CNM Pitocin 30 mU/min  Assessment/Plan:   30 y.o. G2P0010 [redacted]w[redacted]d Kelly T Jenkinsis a 30 y.o.femalecurrently 39 weeks, G2P0010 admitted on 5/17 at [redacted]w[redacted]d for IOL for PreE with SF, SR BP on magnesium, HIV+ on AZT, undetected viral load in April 2022, HSV+ no lesions on valtrex, DM2, CBG stable on po metformin and glyburide, h/o anxiety/depression no meds, mood stable, progressing in latent labor s/p cytotex and foley bulb, latent labor on pitocin. Pitocin break so pt can eat and shower. Will restart after she eats breakfast.   Labor: latent labor, restart pitocin 2x2 after breakfast Preeclampsia:  on magnesium sulfate 2gms/hr HSV- continue valtrex HIV-Pending viral load, continue universal precaution with AZT 400mg  continuous at 27.14mL/hr. NO FSE notes placed on the door, avoid IUPC. Will avoid AROM if possible to decrease fetal transmittion. Research shows [facility] for 2 mins is benaificial and not  contraindicated.  DM2- low carb, .Q4H BS until active labor then Q2H, continue metformin 1000mg  mornings and glyburide 5mg  nightly.  Fetal Wellbeing:  Category I Pain Control:  Epidural prn I/D:  GBS negative Anticipated MOD:  NSVD  Dr Alwyn Pea notified of pt status and agrees with [place] MSN, CNM 06/18/2020 8:23 AM

## 2020-06-19 ENCOUNTER — Other Ambulatory Visit: Payer: Self-pay

## 2020-06-19 ENCOUNTER — Encounter (HOSPITAL_COMMUNITY): Payer: Self-pay | Admitting: Obstetrics & Gynecology

## 2020-06-19 DIAGNOSIS — D62 Acute posthemorrhagic anemia: Secondary | ICD-10-CM | POA: Diagnosis not present

## 2020-06-19 LAB — CBC
HCT: 29.4 % — ABNORMAL LOW (ref 36.0–46.0)
Hemoglobin: 10.6 g/dL — ABNORMAL LOW (ref 12.0–15.0)
MCH: 28 pg (ref 26.0–34.0)
MCHC: 36.1 g/dL — ABNORMAL HIGH (ref 30.0–36.0)
MCV: 77.8 fL — ABNORMAL LOW (ref 80.0–100.0)
Platelets: 340 10*3/uL (ref 150–400)
RBC: 3.78 MIL/uL — ABNORMAL LOW (ref 3.87–5.11)
RDW: 15.2 % (ref 11.5–15.5)
WBC: 11.4 10*3/uL — ABNORMAL HIGH (ref 4.0–10.5)
nRBC: 0 % (ref 0.0–0.2)

## 2020-06-19 LAB — COMPREHENSIVE METABOLIC PANEL
ALT: 14 U/L (ref 0–44)
AST: 18 U/L (ref 15–41)
Albumin: 2.1 g/dL — ABNORMAL LOW (ref 3.5–5.0)
Alkaline Phosphatase: 114 U/L (ref 38–126)
Anion gap: 6 (ref 5–15)
BUN: 12 mg/dL (ref 6–20)
CO2: 23 mmol/L (ref 22–32)
Calcium: 6.8 mg/dL — ABNORMAL LOW (ref 8.9–10.3)
Chloride: 103 mmol/L (ref 98–111)
Creatinine, Ser: 1.1 mg/dL — ABNORMAL HIGH (ref 0.44–1.00)
GFR, Estimated: >60 mL/min (ref >60)
Glucose, Bld: 116 mg/dL — ABNORMAL HIGH (ref 70–99)
Potassium: 4.4 mmol/L (ref 3.5–5.1)
Sodium: 132 mmol/L — ABNORMAL LOW (ref 135–145)
Total Bilirubin: 0.3 mg/dL (ref 0.3–1.2)
Total Protein: 5.3 g/dL — ABNORMAL LOW (ref 6.5–8.1)

## 2020-06-19 LAB — MAGNESIUM
Magnesium: 7.2 mg/dL (ref 1.7–2.4)
Magnesium: 7.9 mg/dL (ref 1.7–2.4)
Magnesium: 6.7 mg/dL (ref 1.7–2.4)

## 2020-06-19 LAB — CREATININE, SERUM
Creatinine, Ser: 1.23 mg/dL — ABNORMAL HIGH (ref 0.44–1.00)
GFR, Estimated: >60 mL/min (ref >60)

## 2020-06-19 LAB — GLUCOSE, CAPILLARY
Glucose-Capillary: 116 mg/dL — ABNORMAL HIGH (ref 70–99)
Glucose-Capillary: 156 mg/dL — ABNORMAL HIGH (ref 70–99)
Glucose-Capillary: 132 mg/dL — ABNORMAL HIGH (ref 70–99)

## 2020-06-19 MED ORDER — EMTRICITABINE-TENOFOVIR AF 200-25 MG PO TABS
1.0000 | ORAL_TABLET | Freq: Every day | ORAL | Status: DC
  Administered 2020-06-19 – 2020-06-21 (×3): 1 via ORAL
  Filled 2020-06-19 (×3): qty 1

## 2020-06-19 MED ORDER — EMTRICITABINE-TENOFOVIR AF 200-25 MG PO TABS
1.0000 | ORAL_TABLET | Freq: Every day | ORAL | Status: DC
  Filled 2020-06-19: qty 1

## 2020-06-19 MED ORDER — DOLUTEGRAVIR SODIUM 50 MG PO TABS
50.0000 mg | ORAL_TABLET | Freq: Every day | ORAL | Status: DC
  Administered 2020-06-19 – 2020-06-21 (×3): 50 mg via ORAL
  Filled 2020-06-19 (×3): qty 1

## 2020-06-19 MED ORDER — MISOPROSTOL 200 MCG PO TABS
200.0000 ug | ORAL_TABLET | Freq: Four times a day (QID) | ORAL | Status: AC
  Administered 2020-06-19: 200 ug via ORAL
  Filled 2020-06-19: qty 1

## 2020-06-19 MED ORDER — DOLUTEGRAVIR SODIUM 50 MG PO TABS
50.0000 mg | ORAL_TABLET | Freq: Every day | ORAL | Status: DC
  Filled 2020-06-19: qty 1

## 2020-06-19 MED ORDER — MISOPROSTOL 200 MCG PO TABS
1000.0000 ug | ORAL_TABLET | Freq: Once | ORAL | Status: AC
  Administered 2020-06-19: 1000 ug via RECTAL
  Filled 2020-06-19: qty 5

## 2020-06-19 MED ORDER — TRANEXAMIC ACID-NACL 1000-0.7 MG/100ML-% IV SOLN
1000.0000 mg | Freq: Once | INTRAVENOUS | Status: AC
  Administered 2020-06-19: 1000 mg via INTRAVENOUS
  Filled 2020-06-19: qty 100

## 2020-06-19 MED ORDER — OXYTOCIN-SODIUM CHLORIDE 30-0.9 UT/500ML-% IV SOLN
10.0000 [IU]/h | INTRAVENOUS | Status: DC
  Administered 2020-06-19: 10 [IU]/h via INTRAVENOUS

## 2020-06-19 MED ORDER — NIFEDIPINE ER OSMOTIC RELEASE 60 MG PO TB24: 60.0000 mg | ORAL_TABLET | Freq: Every day | ORAL | Status: DC

## 2020-06-19 MED ORDER — NIFEDIPINE ER OSMOTIC RELEASE 60 MG PO TB24
60.0000 mg | ORAL_TABLET | Freq: Every day | ORAL | Status: DC
  Administered 2020-06-19 – 2020-06-21 (×3): 60 mg via ORAL
  Filled 2020-06-19 (×3): qty 1

## 2020-06-19 MED ORDER — TRANEXAMIC ACID-NACL 1000-0.7 MG/100ML-% IV SOLN
1000.0000 mg | INTRAVENOUS | Status: AC
  Administered 2020-06-19: 1000 mg via INTRAVENOUS
  Filled 2020-06-19: qty 100

## 2020-06-19 MED ORDER — MISOPROSTOL 200 MCG PO TABS: 200.0000 ug | ORAL_TABLET | Freq: Four times a day (QID) | ORAL | Status: DC

## 2020-06-19 NOTE — Plan of Care (Signed)
To bedside 19:30. Patient resting comfortably. Visitor at bedside. Introduced self. Discussed POC with patient. Patient stated she wished to rest until 2100. No complaints of pain. Will continue to monitor.

## 2020-06-19 NOTE — Progress Notes (Addendum)
Subjective: Postpartum Day 1 Cesarean Delivery Patient reports tolerating PO and + flatus.   Preeclampsia with severe features Currently on MgSO4 2 gm maintenance dose. Will decrease to 1 gm due to MgSO4 level 7.9 Begin Procardia 60 xl now Pt feels "good overall". Pain well controlled. Just feels "tired" Baby currently in NICU. Eventually desires inpatient circumcision when cleared Type 2 diabetic, controlled with 500 mg Metformin BID with meals and glyburide at HS HIV pos, received AZT in labor Current viral load 50 Truvada and Tivicay continued inpatient  Objective: Vital signs in last 24 hours: Temp:  [96 F (35.6 C)-98.8 F (37.1 C)] 97.8 F (36.6 C) (05/20 0433) Pulse Rate:  [75-102] 89 (05/20 0800) Resp:  [11-20] 18 (05/20 0800) BP: (112-165)/(69-114) 146/83 (05/20 0800) SpO2:  [92 %-100 %] 99 % (05/20 0800)  Physical Exam:  General: alert, cooperative, appears stated age and no distress Lochia: appropriate Uterine Fundus: firm Incision: Bandage Clean Dry Intact DVT Evaluation: No evidence of DVT seen on physical exam. Negative Homan's sign. No cords or calf tenderness. No significant calf/ankle edema. SCDs   Recent Labs    06/17/20 1547 06/18/20 1606  HGB 11.2* 11.8*  HCT 31.8* 34.0*    Assessment/Plan: POD 1. Doing well overall. Reviewed MgSO4 level with Dr. Alesia Richards and HIV home medications for continuation inpatient.  Decrease MgSO4 maintenance to 1 gm, start oral antihypertensives MgSO4 to DC at 2130 Day 1 objects and goals discussed PT hopes to see baby in NICU or back in room soon .Anderson Malta B Reno Clasby 06/19/2020, 8:22 AM

## 2020-06-19 NOTE — Progress Notes (Signed)
Call to [facility] CNM. Patient got up to void (first time this shift) on toliet patient passed a large clot. Patient also states she voided at the time. Triton was used to measure and contents bloody. Triton QBL 726 including what was on pad. Upon getting back in bed, fundal check firm u/1 no clots or trickles seen.

## 2020-06-19 NOTE — Progress Notes (Addendum)
Kelly Hunter, CNM notified of continued increase vaginal bleeding. Pt reports feeling dizzy. Vital signs are WNL. Fundus is firm @U -2. Provider coming to bedside for assessment. Toya Smothers, RN

## 2020-06-19 NOTE — Progress Notes (Addendum)
Called to patient's room for patient's report of a "gush" of fluid. Patient had saturated a yellow pad and part of a chux pad. Triton accumulation is 665. Johna Sheriff RN calling provider to update. Toya Smothers, RN

## 2020-06-19 NOTE — Progress Notes (Signed)
Called by RN due to gush upon pts first time up to restroom since her foley was removed. . Pad saturated. However per RN fundus now firm, bleeding minimal. Triton measured 700+. MgSO4 now off. Consulted Dr. Mancel Bale, admin bag of pitocin now, TXA, rectal cytotec 1052mcg. CBC, CMP now. RN updated and aware of warning signs to call provider.

## 2020-06-19 NOTE — Progress Notes (Signed)
Notified Chantee Cerino Crumpler, CNM of critical mag level of 6.7. No new orders received at this time.

## 2020-06-19 NOTE — Progress Notes (Signed)
CSW confirmed with MOB that MOB wants ID follow-up for infant at [facility]. Infant's appointment is scheduled for July 03, 2020 at 1pm.   Weekend CSW will complete clinical assessment with MOB after MOB's magnesium is discontinued.   CSW will continue to offer resources and supports to family while infant remains in NICU.    Laurey Arrow, MSW, LCSW Clinical Social Work [phone]

## 2020-06-19 NOTE — Progress Notes (Signed)
Called by RN due to pt feeling like she had a "gush". Pad saturated. However per RN fundus now firm, bleeding minimal. Reassessment in 1 hour shows further bleeding. CNM at bedside for evaluation. Pt had just ambulated to the restroom to clean up. Current pad evaluated with minimal blood, firm fundus. No gushing. Pad in bathroom without clots. 40% saturated. QBL 600+ per RN. TXA now, followed by cytotec x 1. RN to notify with any further bleeding issues. Updated pt on plan of care.

## 2020-06-20 LAB — COMPREHENSIVE METABOLIC PANEL
ALT: 14 U/L (ref 0–44)
AST: 19 U/L (ref 15–41)
Albumin: 2.1 g/dL — ABNORMAL LOW (ref 3.5–5.0)
Alkaline Phosphatase: 94 U/L (ref 38–126)
Anion gap: 8 (ref 5–15)
BUN: 14 mg/dL (ref 6–20)
CO2: 20 mmol/L — ABNORMAL LOW (ref 22–32)
Calcium: 6.3 mg/dL — CL (ref 8.9–10.3)
Chloride: 100 mmol/L (ref 98–111)
Creatinine, Ser: 1.16 mg/dL — ABNORMAL HIGH (ref 0.44–1.00)
GFR, Estimated: >60 mL/min (ref >60)
Glucose, Bld: 137 mg/dL — ABNORMAL HIGH (ref 70–99)
Potassium: 4 mmol/L (ref 3.5–5.1)
Sodium: 128 mmol/L — ABNORMAL LOW (ref 135–145)
Total Bilirubin: 0.4 mg/dL (ref 0.3–1.2)
Total Protein: 5.2 g/dL — ABNORMAL LOW (ref 6.5–8.1)

## 2020-06-20 LAB — MAGNESIUM
Magnesium: 5.2 mg/dL — ABNORMAL HIGH (ref 1.7–2.4)
Magnesium: 4 mg/dL — ABNORMAL HIGH (ref 1.7–2.4)
Magnesium: 3.4 mg/dL — ABNORMAL HIGH (ref 1.7–2.4)

## 2020-06-20 LAB — CBC
HCT: 22.9 % — ABNORMAL LOW (ref 36.0–46.0)
Hemoglobin: 7.9 g/dL — ABNORMAL LOW (ref 12.0–15.0)
MCH: 28.2 pg (ref 26.0–34.0)
MCHC: 34.5 g/dL (ref 30.0–36.0)
MCV: 81.8 fL (ref 80.0–100.0)
Platelets: 316 10*3/uL (ref 150–400)
RBC: 2.8 MIL/uL — ABNORMAL LOW (ref 3.87–5.11)
RDW: 15.4 % (ref 11.5–15.5)
WBC: 15.5 10*3/uL — ABNORMAL HIGH (ref 4.0–10.5)
nRBC: 0 % (ref 0.0–0.2)

## 2020-06-20 LAB — GLUCOSE, CAPILLARY
Glucose-Capillary: 149 mg/dL — ABNORMAL HIGH (ref 70–99)
Glucose-Capillary: 81 mg/dL (ref 70–99)
Glucose-Capillary: 115 mg/dL — ABNORMAL HIGH (ref 70–99)

## 2020-06-20 MED ORDER — MAGNESIUM OXIDE -MG SUPPLEMENT 400 (240 MG) MG PO TABS
400.0000 mg | ORAL_TABLET | ORAL | Status: DC
  Administered 2020-06-20: 400 mg via ORAL
  Filled 2020-06-20: qty 1

## 2020-06-20 MED ORDER — POLYSACCHARIDE IRON COMPLEX 150 MG PO CAPS
150.0000 mg | ORAL_CAPSULE | Freq: Every day | ORAL | Status: DC
  Administered 2020-06-20 – 2020-06-21 (×2): 150 mg via ORAL
  Filled 2020-06-20 (×2): qty 1

## 2020-06-20 NOTE — Progress Notes (Addendum)
Subjective: POD# 2 Information for the patient's newborn:  Kelly Hunter, Kelly Hunter [[id number]]  female    65 Name Kelly Hunter Circumcision desires in-patient circ, infant in NICU for glycemic control  Reports feeling very good, walking to NICU independently and safely Feeding: bottle Reports tolerating PO and denies N/V, foley removed, ambulating and urinating w/o difficulty  Pain controlled with PO meds Denies HA/SOB/dizziness  Flatus passing Vaginal bleeding is normal, no clots     Objective:  VS:  Vitals:   06/19/20 1954 06/19/20 2348 06/20/20 0430 06/20/20 0805  BP: (!) 132/92 (!) 141/97 113/67 124/81  Pulse: 96 (!) 102 100 91  Resp: 19 18  18   Temp: 98.6 F (37 C) 99 F (37.2 C) 99.4 F (37.4 C) 98.1 F (36.7 C)  TempSrc: Oral Oral Oral Oral  SpO2: 96% 100%  99%  Weight:      Height:        Intake/Output Summary (Last 24 hours) at 06/20/2020 1236 Last data filed at 06/20/2020 9528 Gross per 24 hour  Intake 3072.4 ml  Output 4091 ml  Net -1018.6 ml     Recent Labs    06/19/20 0819 06/19/20 2322  WBC 11.4* 15.5*  HGB 10.6* 7.9*  HCT 29.4* 22.9*  PLT 340 316    Blood type: --/--/B POS (05/17 1245) Rubella: Immune (02/07 0000)    Physical Exam:  General: alert, cooperative and no distress CV: Regular rate and rhythm Resp: clear Abdomen: soft, nontender, normal bowel sounds Incision: pressure dressing remains inplace, pt to remove during shower. Honeycomb dressing will remain in place.  Perineum:  Uterine Fundus: firm, below umbilicus, nontender Lochia: appropriate, no clots Ext: negative for edema, pain, tenderness, and cords.    Assessment/Plan: 30 y.o.   POD# 2. G2P0010                  Active Problems:   HIV positive   Primary C/S for maternal request   Type 2 diabetes mellitus (HCC)   Severe preeclampsia   Acute blood loss anemia    -asymptomatic   H/O anxiety disorder   H/O depression   Continue routine post-op PP care  Add iron  polysaccharides  Continue Metformin 1000 mg PO daily in AM and Glyburide 5 mg PO HS        Infant in NICU for glycemic control Anticipate D/C 06/21/20   Arrie Eastern, MSN, CNM 06/20/2020, 12:36 PM  MD Addendum:   I saw and examined patient at bedside and agree with above findings, assessment and plan as outlined above by  Arrie Eastern, MSN, CNM.  Dr. Alesia Richards. 06/20/2020.

## 2020-06-20 NOTE — Clinical Social Work Maternal (Addendum)
CLINICAL SOCIAL WORK MATERNAL/CHILD NOTE  Patient Details  Name: Kelly Hunter MRN: [id number] Date of Birth: [birth year]  Date:  06/20/2020  Clinical Social Worker Initiating Note:  Darcus Austin, MSW, LCSWA Date/Time: Initiated:  06/20/20/1011     Child's Name:  Kelly Hunter   Biological Parents:  Mother,Father Kelly Hunter, 01/20/88, [phone])   Need for Interpreter:  None   Reason for Referral:  Parental Support of Premature Babies < 32 weeks/or Critically Ill babies   Address:  [place] [address]    Phone number:  [phone] (home)     Additional phone number:   Household Members/Support Persons (HM/SP):       HM/SP Name Relationship DOB or Age  HM/SP -1        HM/SP -2        HM/SP -3        HM/SP -4        HM/SP -5        HM/SP -6        HM/SP -7        HM/SP -8          Natural Supports (not living in the home):  Rosalyn Charters (Mom)   Professional Supports:     Employment: Animator   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   Education:  [place] arranged:    Pensions consultant:  Radio producer)   Other Resources:  [facility]   Cultural/Religious Considerations Which May Impact Care:    Strengths:  Ability to meet basic needs ,Home prepared for child ,Understanding of illness,Pediatrician chosen   Psychotropic Medications:         Pediatrician:    [facility]  Pediatrician List:   [facility] [place] (Peds)  [place]      Pediatrician Fax Number:    Risk Factors/Current Problems:  Other(Comment) (HIV Exposure/ NICU admission)   Cognitive State:  Able to Concentrate ,Alert ,Goal Oriented ,Insightful ,Linear Thinking    Mood/Affect:  Comfortable ,Interested ,Bright ,Calm ,Happy    CSW Assessment: CSW met with MOB to complete assessment for infant's NICU  admission and HIV exposure. CSW observed MOB at infant's bedside with RN. CSW explained role and reason for consult. MOB was pleasant, polite, and engaged with CSW. MOB reported, hx of anxiety and depression back in 2019 when she lost her mother. MOB reported, she has been managing symptoms with no medication. MOB reported, HIV dx in 2015, she's undetectable, and her medication is managing her symptoms. MOB reported,  FOB Kelly Hunter) is aware of HIV exposure. MOB reported, she has been handling HIV dx since the age of 76 and does not feel any way about exposure.   CSW provided MOB infant's appointment with [facility] on 07/03/20 _0 .   CSW provided education regarding the baby blues period vs. perinatal mood disorders, discussed treatment and gave resources for mental health follow up if concerns arise. CSW recommends self- evaluation during the postpartum time period using the New Mom Checklist from Postpartum Progress and encouraged MOB to contact a medical professional if symptoms are noted at any time.   When CSW asked MOB about her emotions and infant's NICU admission. MOB reported, she feels excited and okay about infant's NICU admission. MOB reported, her father, and friends are supports. MOB denied  SI, HI and DV when CSW assessed for safety.    MOB reported, she receive WIC, but does not receive food stamps. CSW informed MOB about applying for food stamps since infant's delivery. MOB reported, infant's pediatrician will be at [facility] and there are no barriers to follow up care. MOB reported, she has all essentials needed to care for infant. MOB reported, infant has a car seat, bassinet, and pack & play. MOB denied any additional barriers.     CSW provided education on Sudden Infant Death Syndrome (SIDS).    CSW will continue to offer resources and ongoing support during infant's NICU admission.   CSW Plan/Description:  Psychosocial Support and Ongoing  Assessment of Needs,Perinatal Mood and Anxiety Disorder (PMADs) Education,Sudden Infant Death Syndrome (SIDS) Education,Other ([facility] appt 07/03/20 _0 )    Darcus Austin, [place] 06/20/2020, 11:13 AM   Darcus Austin, MSW, LCSW-A Clinical Social Worker [phone]

## 2020-06-21 LAB — GLUCOSE, CAPILLARY: Glucose-Capillary: 96 mg/dL (ref 70–99)

## 2020-06-21 MED ORDER — OXYCODONE HCL 5 MG PO TABS: 5.0000 mg | ORAL_TABLET | ORAL | 0 refills | Status: DC | PRN

## 2020-06-21 MED ORDER — IBUPROFEN 600 MG PO TABS: 600.0000 mg | ORAL_TABLET | Freq: Four times a day (QID) | ORAL | 0 refills | Status: DC

## 2020-06-21 MED ORDER — GLYBURIDE 5 MG PO TABS: 5.0000 mg | ORAL_TABLET | Freq: Every day | ORAL | 1 refills | Status: DC

## 2020-06-21 MED ORDER — NIFEDIPINE ER 60 MG PO TB24: 60.0000 mg | ORAL_TABLET | Freq: Every day | ORAL | 1 refills | Status: DC

## 2020-06-21 MED ORDER — POLYSACCHARIDE IRON COMPLEX 150 MG PO CAPS: 150.0000 mg | ORAL_CAPSULE | Freq: Every day | ORAL | 0 refills | Status: DC

## 2020-06-21 MED ORDER — ACETAMINOPHEN 500 MG PO TABS: 1000.0000 mg | ORAL_TABLET | Freq: Four times a day (QID) | ORAL | 0 refills | Status: DC

## 2020-06-21 MED ORDER — METFORMIN HCL 500 MG PO TABS: 1000.0000 mg | ORAL_TABLET | Freq: Every day | ORAL | 1 refills | Status: DC

## 2020-06-21 NOTE — Discharge Summary (Signed)
Cesarean Section OB Discharge Summary  Patient Name: Kelly Hunter DOB: Jun 26, 1990 MRN: [id number]  Date of admission: 06/16/2020 Intrauterine pregnancy: [redacted]w[redacted]d   Admitting diagnosis: IUGR (intrauterine growth restriction) affecting care of mother [O36.5990] Secondary diagnosis: Preeclampsia  Date of discharge: 06/21/2020    Discharge diagnosis: Term Pregnancy Delivered     Prenatal history: G2P0010   EDC : 06/24/2020, by Last Menstrual Period  Prenatal care at [place]  Primary provider : [facility] Prenatal course complicated by HIV, Preeclampsia with SF  Prenatal Labs: ABO, Rh: --/--/B POS (05/17 1245) /  Antibody: NEG (05/17 1245) Rubella: Immune (02/07 0000)  /  RPR: NON REACTIVE (05/17 1245)  HBsAg: Negative (10/21 0000)  HIV: Reactive (10/21 0000)  GBS: Negative/-- (04/30 0000)                                    Hospital course:  Induction of Labor With Cesarean Section   30 y.o. yo G2P0010 at [redacted]w[redacted]d was admitted to the hospital 06/16/2020 for induction of labor. Patient had a labor course significant for prolonged latent phase. The patient went for cesarean section due to Elective Primary. Delivery details are as follows: Membrane Rupture Time/Date:  ,   Delivery Method:C-Section, Low Transverse  Details of operation can be found in separate operative Note.  Patient had an uncomplicated postpartum course. She is ambulating, tolerating a regular diet, passing flatus, and urinating well.  Patient is discharged home in stable condition on 06/21/20.      Newborn Data: Birth date:06/18/2020  Birth time:8:47 PM  Gender:Female  Living status:  Apgars:7 ,8  Weight:3970 g                                Delivering PROVIDER: Sanjuana Kava                                                            Complications: None  Newborn Data: Live born female  Birth Weight: 8 lb 12 oz (3970 g) APGAR: 7, 8  Newborn Delivery   Birth date/time: 06/18/2020 20:47:00 Delivery type: C-Section, Low  Transverse C-section categorization: Primary      Baby Feeding: Bottle Disposition:NICU  For hypoglycemia Post partum procedures:N/A  Labs: Lab Results  Component Value Date   WBC 15.5 (H) 06/19/2020   HGB 7.9 (L) 06/19/2020   HCT 22.9 (L) 06/19/2020   MCV 81.8 06/19/2020   PLT 316 06/19/2020   CMP Latest Ref Rng & Units 06/19/2020  Glucose 70 - 99 mg/dL 137(H)  BUN 6 - 20 mg/dL 14  Creatinine 0.44 - 1.00 mg/dL 1.16(H)  Sodium 135 - 145 mmol/L 128(L)  Potassium 3.5 - 5.1 mmol/L 4.0  Chloride 98 - 111 mmol/L 100  CO2 22 - 32 mmol/L 20(L)  Calcium 8.9 - 10.3 mg/dL 6.3(LL)  Total Protein 6.5 - 8.1 g/dL 5.2(L)  Total Bilirubin 0.3 - 1.2 mg/dL 0.4  Alkaline Phos 38 - 126 U/L 94  AST 15 - 41 U/L 19  ALT 0 - 44 U/L 14    Physical Exam @ time of discharge:  Vitals:   06/21/20 0017 06/21/20 0020 06/21/20 7341 06/21/20  0802  BP: (!) 144/94  139/84 129/85  Pulse: 96  98 (!) 101  Resp: 16  18 18   Temp: 98.4 F (36.9 C)  97.8 F (36.6 C) 98.4 F (36.9 C)  TempSrc: Oral  Oral Oral  SpO2: 100% 99% 98% 96%  Weight:      Height:       general: alert, cooperative and no distress lochia: appropriate uterine fundus: firm perineum: intact incision: Dressing is clean, dry, and intact extremities: DVT Evaluation: No evidence of DVT seen on physical exam. Negative Homan's sign. No cords or calf tenderness. No significant calf/ankle edema.  Discharge instructions:  "Baby and Me Booklet" and [place] Discharge Medications:  Allergies as of 06/21/2020      Reactions   Other Nausea And Vomiting, Swelling   SWELLING REACTION UNSPECIFIED  Tree Nuts (Almonds)   Peanut Oil Nausea And Vomiting, Swelling   SWELLING REACTION UNSPECIFIED    Peanut-containing Drug Products Nausea And Vomiting, Swelling   SWELLING REACTION UNSPECIFIED    Pyridium [phenazopyridine Hcl] Nausea And Vomiting   Bactrim [sulfamethoxazole-trimethoprim] Rash      Medication List    STOP taking  these medications   ondansetron 8 MG disintegrating tablet Commonly known as: Zofran ODT   OneTouch Delica Plus [id number] Misc   triamcinolone 0.1 % cream : eucerin Crea   valACYclovir 1000 MG tablet Commonly known as: VALTREX     TAKE these medications   acetaminophen 500 MG tablet Commonly known as: TYLENOL Take 2 tablets (1,000 mg total) by mouth every 6 (six) hours.   emtricitabine-tenofovir 200-300 MG tablet Commonly known as: TRUVADA TAKE 1 TABLET BY MOUTH DAILY.   glyBURIDE 5 MG tablet Commonly known as: DIABETA Take 1 tablet (5 mg total) by mouth at bedtime.   ibuprofen 600 MG tablet Commonly known as: ADVIL Take 1 tablet (600 mg total) by mouth every 6 (six) hours.   iron polysaccharides 150 MG capsule Commonly known as: NIFEREX Take 1 capsule (150 mg total) by mouth daily.   metFORMIN 500 MG tablet Commonly known as: GLUCOPHAGE TAKE 1 TABLET(500 MG) BY MOUTH TWICE DAILY WITH A MEAL What changed: Another medication with the same name was added. Make sure you understand how and when to take each.   metFORMIN 500 MG tablet Commonly known as: GLUCOPHAGE Take 2 tablets (1,000 mg total) by mouth daily with breakfast. What changed: You were already taking a medication with the same name, and this prescription was added. Make sure you understand how and when to take each.   NIFEdipine 60 MG 24 hr tablet Commonly known as: ADALAT CC Take 1 tablet (60 mg total) by mouth daily.   oxyCODONE 5 MG immediate release tablet Commonly known as: Oxy IR/ROXICODONE Take 1-2 tablets (5-10 mg total) by mouth every 4 (four) hours as needed for moderate pain.   prenatal multivitamin Tabs tablet Take 1 tablet by mouth daily at 12 noon.   Tivicay 50 MG tablet Generic drug: dolutegravir TAKE 1 TABLET (50 MG TOTAL) BY MOUTH DAILY.            Discharge Care Instructions  (From admission, onward)         Start     Ordered   06/21/20 0000  If the dressing is still on  your incision site when you go home, remove it on the third day after your surgery date. Remove dressing if it begins to fall off, or if it is dirty or damaged before the third  day.        06/21/20 0932         Diet: carb modified diet Activity: Advance as tolerated. Pelvic rest x 6 weeks.  Follow up:1 week for B/P and incision check 4 weeks for pp visit.  Signed: Domingo Pulse MSN, CNM 06/21/2020, 9:32 AM

## 2020-06-22 ENCOUNTER — Other Ambulatory Visit: Payer: Self-pay | Admitting: Infectious Disease

## 2020-06-22 ENCOUNTER — Other Ambulatory Visit (HOSPITAL_COMMUNITY): Payer: Self-pay

## 2020-06-22 ENCOUNTER — Other Ambulatory Visit: Payer: Self-pay | Admitting: *Deleted

## 2020-06-22 DIAGNOSIS — B20 Human immunodeficiency virus [HIV] disease: Secondary | ICD-10-CM

## 2020-06-22 MED ORDER — BIKTARVY 50-200-25 MG PO TABS
1.0000 | ORAL_TABLET | Freq: Every day | ORAL | 3 refills | Status: DC
  Filled 2020-06-22 – 2020-07-22 (×3): qty 30, 30d supply, fill #0

## 2020-06-22 MED FILL — Dolutegravir Sodium Tab 50 MG (Base Equiv): ORAL | 30 days supply | Qty: 30 | Fill #1 | Status: CN

## 2020-06-22 NOTE — Progress Notes (Signed)
Received refill request for Truvada. Per last office note, patient was on tivicay/truvada through pregnancy with intention to changed back to biktarvy after giving birth. Patient delivered 06/16/20 via cesarean. RN sent biktarvy prescription per Dr Derek Mound last note, cancelled truvada/tivicay at [place]. Called patient, left message to notifiy patient of the change. Landis Gandy, RN

## 2020-06-23 ENCOUNTER — Other Ambulatory Visit (HOSPITAL_COMMUNITY): Payer: Self-pay

## 2020-07-01 ENCOUNTER — Other Ambulatory Visit (HOSPITAL_COMMUNITY): Payer: Self-pay

## 2020-07-01 DIAGNOSIS — Z419 Encounter for procedure for purposes other than remedying health state, unspecified: Secondary | ICD-10-CM | POA: Diagnosis not present

## 2020-07-03 ENCOUNTER — Other Ambulatory Visit (HOSPITAL_COMMUNITY): Payer: Self-pay

## 2020-07-07 ENCOUNTER — Ambulatory Visit: Payer: 59 | Admitting: Obstetrics and Gynecology

## 2020-07-13 ENCOUNTER — Other Ambulatory Visit (HOSPITAL_COMMUNITY): Payer: Self-pay

## 2020-07-22 ENCOUNTER — Other Ambulatory Visit: Payer: Self-pay | Admitting: Infectious Disease

## 2020-07-22 ENCOUNTER — Other Ambulatory Visit (HOSPITAL_COMMUNITY): Payer: Self-pay

## 2020-07-22 DIAGNOSIS — A6000 Herpesviral infection of urogenital system, unspecified: Secondary | ICD-10-CM

## 2020-07-22 MED ORDER — VALACYCLOVIR HCL 1 G PO TABS
ORAL_TABLET | Freq: Every day | ORAL | 4 refills | Status: AC
  Filled 2020-07-22: qty 30, 30d supply, fill #0
  Filled 2021-04-02 – 2021-04-28 (×2): qty 30, 30d supply, fill #1
  Filled 2021-06-08: qty 30, 30d supply, fill #2
  Filled 2021-07-14: qty 30, 30d supply, fill #3

## 2020-07-23 ENCOUNTER — Other Ambulatory Visit (HOSPITAL_COMMUNITY): Payer: Self-pay

## 2020-07-31 DIAGNOSIS — Z419 Encounter for procedure for purposes other than remedying health state, unspecified: Secondary | ICD-10-CM | POA: Diagnosis not present

## 2020-08-04 ENCOUNTER — Other Ambulatory Visit (HOSPITAL_COMMUNITY): Payer: Self-pay

## 2020-08-05 ENCOUNTER — Other Ambulatory Visit: Payer: Self-pay

## 2020-08-05 ENCOUNTER — Ambulatory Visit (INDEPENDENT_AMBULATORY_CARE_PROVIDER_SITE_OTHER): Payer: 59 | Admitting: Infectious Disease

## 2020-08-05 ENCOUNTER — Other Ambulatory Visit (HOSPITAL_COMMUNITY): Payer: Self-pay

## 2020-08-05 VITALS — BP 117/80 | HR 90 | Resp 16 | Ht 63.0 in | Wt 220.0 lb

## 2020-08-05 DIAGNOSIS — N870 Mild cervical dysplasia: Secondary | ICD-10-CM | POA: Diagnosis not present

## 2020-08-05 DIAGNOSIS — O99210 Obesity complicating pregnancy, unspecified trimester: Secondary | ICD-10-CM

## 2020-08-05 DIAGNOSIS — O09893 Supervision of other high risk pregnancies, third trimester: Secondary | ICD-10-CM | POA: Diagnosis not present

## 2020-08-05 DIAGNOSIS — Z7185 Encounter for immunization safety counseling: Secondary | ICD-10-CM

## 2020-08-05 DIAGNOSIS — B20 Human immunodeficiency virus [HIV] disease: Secondary | ICD-10-CM

## 2020-08-05 MED ORDER — BIKTARVY 50-200-25 MG PO TABS
1.0000 | ORAL_TABLET | Freq: Every day | ORAL | 11 refills | Status: DC
  Filled 2020-08-05 – 2020-08-18 (×2): qty 30, 30d supply, fill #0
  Filled 2020-09-15: qty 30, 30d supply, fill #1
  Filled 2020-10-16: qty 30, 30d supply, fill #2
  Filled 2020-11-11: qty 30, 30d supply, fill #3
  Filled 2020-12-09: qty 30, 30d supply, fill #4
  Filled 2021-01-05: qty 30, 30d supply, fill #5
  Filled 2021-02-11: qty 30, 30d supply, fill #6
  Filled 2021-02-24: qty 30, 30d supply, fill #7

## 2020-08-05 NOTE — Progress Notes (Signed)
Subjective:  Chief complaint: Follow-up for HIV disease    Patient ID: Kelly Hunter, female    DOB: 1990-10-23, 30 y.o.   MRN: [id number]  HPI  30 year-old African-American lady with HIV disease with perfect virological suppression on TRIUMEQ --> Biktarvy  She had  become pregnant was in the first few weeks of her pregnancy was seen by my partner Dr. West Bali, and changed to twice a day Isentress plus once daily Truvada.  She  Tolerated these  medications well but did    She did not like taking multiple pills multiple times per day.  I reviewed with her the data from [place] and current updated DHHS guidelines which include TIVICAY as a preferred regimen throughout pregnancy and including for women he might become pregnant.  I offered options of TIVICAY plus Truvada versus TIVICAY plus DESCOVY.  We ended up going for TIVICAY and Truvada  She has remained perfectly suppressed on this regimen as well.  She refused COVID-19 vaccination despite my counseling her extensively on the risks of COVID-19 infection during pregnancy.  She also has other risk factors including obesity and her HIV infection.    Her viral load at the time of the delivery was 50 but this was measured in our hospital system and I am quite familiar with our phlebotomy staff and in-house hospital staff not promptly processing viral loads and so expect a viral load of 50 was really an artifact of processing and regardless is only a blip and would not pose any significant concern about her transmitting the virus to her newborn.  She has gone back onto [facility] since then.  She is tolerating quite well.  She again refused an vaccination list when I offered to her in person vaccination here in the clinic against COVID-19.    Past Medical History:  Diagnosis Date   Acne    Allergy    Allegra   Asthma    mild; onset in childhood.  No hospitalizations.   ASTHMA, EXERCISE INDUCED    Cervical high risk HPV  (human papillomavirus) test positive 07/2019   cervical cancer screening due in December, 2021   Chlamydia 03/03/2012   CIN I (cervical intraepithelial neoplasia I) 08/31/2013, 05/01/17   colposcopy by Toney Rakes; s/p CO2 laser treatment. repeat colposcopy by Dr. Quincy Simmonds.   Eczema    Elevated blood sugar 12/25/2017   Genital warts 08/31/2013   [place] treatment; Toney Rakes.   Grieving 12/25/2017   Herpes    Hidradenitis    Hidradenitis axillaris 05/21/2014   HIV (human immunodeficiency virus infection) ([place])    HIV (human immunodeficiency virus) risk factors complicating pregnancy 12/45/8099   HSV-2 seropositive    Migraine headache without aura    Mixed anxiety depressive disorder 06/16/2020   Moderate episode of recurrent major depressive disorder ([place]) 10/24/2018   Substance abuse ([place])    Type 2 diabetes mellitus without complication, without long-term current use of insulin ([place]) 11/17/2014   Vaccine counseling 01/22/2020   Vaginal Pap smear, abnormal    VIN I (vulvar intraepithelial neoplasia I) 08/31/2013   s/p CO2 treatment; [facility]/gyn.    Past Surgical History:  Procedure Laterality Date   BREAST SURGERY  02/01/2008   Reduction   CESAREAN SECTION N/A 06/18/2020   Procedure: CESAREAN SECTION;  Surgeon: Sanjuana Kava, MD;  Location: [facility];  Service: Obstetrics;  Laterality: N/A;   CO2 LASER APPLICATION N/A 8/33/8250   Procedure: C02 laser of vagina and cervix;  Surgeon: Terrance Mass,  MD;  Location: [facility];  Service: Gynecology;  Laterality: N/A;   INCISION AND DRAINAGE ABSCESS Left 03/09/2017   Procedure: INCISION AND DRAINAGE ABSCESS LEFT BREAST ABSCESS;  Surgeon: Erroll Luna, MD;  Location: [facility];  Service: General;  Laterality: Left;    Family History  Problem Relation Age of Onset   Diabetes Mother    Hypertension Mother    Hyperlipidemia Mother    Mental illness Mother    Pulmonary embolism Mother    Cancer Maternal Grandfather        ?   Cancer Paternal Aunt         Cervical or Ovarian per patient      Social History   Socioeconomic History   Marital status: Single    Spouse name: n/a   Number of children: 0   Years of education: Not on file   Highest education level: Not on file  Occupational History   Occupation: Freight forwarder    Comment: [employer]  Tobacco Use   Smoking status: Never   Smokeless tobacco: Never  Vaping Use   Vaping Use: Never used  Substance and Sexual Activity   Alcohol use: Not Currently    Alcohol/week: 0.0 standard drinks    Comment: socially   Drug use: No    Types: Marijuana   Sexual activity: Yes    Partners: Male    Birth control/protection: Condom    Comment: condoms everytime  Other Topics Concern   Not on file  Social History Narrative   Marital status: single;  Not dating in 2016.      Children: none; never pregnant.      Lives: with mom; dad in [place].     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      Education: previously in school; took medical leave in 02/2012; [employer] in [place].      Tobacco: vapor cigarette two months. Pt does not smoke.       Alcohol:  Socially; weekends; no DUIs.      Drugs: marijuana daily.  Started at age 77.        Sexual activity: sexually active; 25 total partners; males; Chlamydia in 03/2012.  Genital warts 08/2013.  HIV diagnosis 12/2013.      Seatbelt: 100%      Guns:  none   Social Determinants of Radio broadcast assistant Strain: Not on file  Food Insecurity: No Food Insecurity   Worried About Charity fundraiser in the Last Year: Never true   Arboriculturist in the Last Year: Never true  Transportation Needs: Not on file  Physical Activity: Not on file  Stress: Not on file  Social Connections: Not on file    Allergies  Allergen Reactions   Other Nausea And Vomiting and Swelling    SWELLING REACTION UNSPECIFIED  Tree Nuts (Almonds)   Peanut Oil Nausea And Vomiting and Swelling    SWELLING REACTION UNSPECIFIED     Peanut-Containing Drug Products Nausea And Vomiting and Swelling    SWELLING REACTION UNSPECIFIED    Pyridium [Phenazopyridine Hcl] Nausea And Vomiting   Bactrim [Sulfamethoxazole-Trimethoprim] Rash     Current Outpatient Medications:    acetaminophen (TYLENOL) 500 MG tablet, Take 2 tablets (1,000 mg total) by mouth every 6 (six) hours., Disp: 30 tablet, Rfl: 0   glyBURIDE (DIABETA) 5 MG tablet, Take 1 tablet (5 mg total) by mouth at bedtime., Disp: 30 tablet, Rfl: 1  iron polysaccharides (NIFEREX) 150 MG capsule, Take 1 capsule (150 mg total) by mouth daily., Disp: 30 capsule, Rfl: 0   metFORMIN (GLUCOPHAGE) 500 MG tablet, TAKE 1 TABLET(500 MG) BY MOUTH TWICE DAILY WITH A MEAL, Disp: 60 tablet, Rfl: 1   metFORMIN (GLUCOPHAGE) 500 MG tablet, Take 2 tablets (1,000 mg total) by mouth daily with breakfast., Disp: 30 tablet, Rfl: 1   NIFEdipine (ADALAT CC) 60 MG 24 hr tablet, Take 1 tablet (60 mg total) by mouth daily., Disp: 30 tablet, Rfl: 1   valACYclovir (VALTREX) 1000 MG tablet, TAKE 1 TABLET BY MOUTH ONCE A DAY, Disp: 30 tablet, Rfl: 4   bictegravir-emtricitabine-tenofovir AF (BIKTARVY) 50-200-25 MG TABS tablet, Take 1 tablet by mouth daily., Disp: 30 tablet, Rfl: 11   oxyCODONE (OXY IR/ROXICODONE) 5 MG immediate release tablet, Take 1-2 tablets (5-10 mg total) by mouth every 4 (four) hours as needed for moderate pain. (Patient not taking: Reported on 08/05/2020), Disp: 30 tablet, Rfl: 0   Review of Systems  Constitutional:  Negative for chills and fever.  HENT:  Negative for congestion and sore throat.   Eyes:  Negative for photophobia.  Respiratory:  Negative for cough, shortness of breath and wheezing.   Cardiovascular:  Negative for chest pain, palpitations and leg swelling.  Gastrointestinal:  Negative for abdominal pain, blood in stool, constipation, diarrhea, nausea and vomiting.  Genitourinary:  Negative for dysuria, flank pain and hematuria.  Musculoskeletal:  Negative for back  pain and myalgias.  Skin:  Negative for rash.  Neurological:  Negative for dizziness, seizures, syncope, speech difficulty, weakness, light-headedness, numbness and headaches.  Hematological:  Does not bruise/bleed easily.  Psychiatric/Behavioral:  Negative for agitation, behavioral problems, decreased concentration, dysphoric mood and suicidal ideas.       Objective:   Physical Exam Constitutional:      General: She is not in acute distress.    Appearance: She is not diaphoretic.  HENT:     Head: Normocephalic and atraumatic.     Right Ear: External ear normal.     Left Ear: External ear normal.     Nose: Nose normal.     Mouth/Throat:     Pharynx: No oropharyngeal exudate.  Eyes:     General: No scleral icterus.       Right eye: No discharge.        Left eye: No discharge.     Extraocular Movements: Extraocular movements intact.     Conjunctiva/sclera: Conjunctivae normal.  Cardiovascular:     Rate and Rhythm: Normal rate and regular rhythm.     Heart sounds:    No friction rub.  Pulmonary:     Effort: Pulmonary effort is normal. No respiratory distress.     Breath sounds: No wheezing.  Abdominal:     General: There is no distension.     Palpations: Abdomen is soft.     Tenderness: There is no rebound.  Musculoskeletal:        General: No tenderness. Normal range of motion.     Cervical back: Normal range of motion and neck supple.  Lymphadenopathy:     Cervical: No cervical adenopathy.  Skin:    General: Skin is warm and dry.     Coloration: Skin is not jaundiced or pale.     Findings: No erythema, lesion or rash.  Neurological:     General: No focal deficit present.     Mental Status: She is alert and oriented to person, place, and time.  Coordination: Coordination normal.  Psychiatric:        Mood and Affect: Mood normal.        Behavior: Behavior normal.        Thought Content: Thought content normal.        Judgment: Judgment normal.    Gravid      Assessment & Plan:   HIV : Continue Biktarvy and we will check a viral load today.  Return to clinic in 6  HSV: Continue Valtrex  Ascus; follow-up with OB/GYN  Vaccine counseling is again counseled need for Covid vaccination she again refused  I spent more than 30 minutes with the patient including face to face counseling of the patient personally reviewing radiographs, long with pertinent laboratory microbiological data review of medical records and in coordination of her care.

## 2020-08-07 LAB — HIV-1 RNA QUANT-NO REFLEX-BLD
HIV 1 RNA Quant: <20 Copies/mL — ABNORMAL HIGH
HIV-1 RNA Quant, Log: <1.3 Log cps/mL — ABNORMAL HIGH

## 2020-08-12 ENCOUNTER — Other Ambulatory Visit (HOSPITAL_COMMUNITY): Payer: Self-pay

## 2020-08-18 ENCOUNTER — Other Ambulatory Visit (HOSPITAL_COMMUNITY): Payer: Self-pay

## 2020-09-04 ENCOUNTER — Encounter (HOSPITAL_COMMUNITY): Payer: Self-pay | Admitting: Obstetrics & Gynecology

## 2020-09-09 ENCOUNTER — Other Ambulatory Visit (HOSPITAL_COMMUNITY): Payer: Self-pay

## 2020-09-11 ENCOUNTER — Other Ambulatory Visit (HOSPITAL_COMMUNITY): Payer: Self-pay

## 2020-09-15 ENCOUNTER — Other Ambulatory Visit (HOSPITAL_COMMUNITY): Payer: Self-pay

## 2020-10-08 ENCOUNTER — Other Ambulatory Visit (HOSPITAL_COMMUNITY): Payer: Self-pay

## 2020-10-16 ENCOUNTER — Other Ambulatory Visit (HOSPITAL_COMMUNITY): Payer: Self-pay

## 2020-11-11 ENCOUNTER — Other Ambulatory Visit (HOSPITAL_COMMUNITY): Payer: Self-pay

## 2020-11-12 ENCOUNTER — Other Ambulatory Visit (HOSPITAL_COMMUNITY): Payer: Self-pay

## 2020-12-09 ENCOUNTER — Other Ambulatory Visit (HOSPITAL_COMMUNITY): Payer: Self-pay

## 2021-01-04 ENCOUNTER — Other Ambulatory Visit (HOSPITAL_COMMUNITY): Payer: Self-pay

## 2021-01-05 ENCOUNTER — Other Ambulatory Visit (HOSPITAL_COMMUNITY): Payer: Self-pay

## 2021-01-28 ENCOUNTER — Other Ambulatory Visit (HOSPITAL_COMMUNITY): Payer: Self-pay

## 2021-02-03 ENCOUNTER — Other Ambulatory Visit: Payer: Self-pay

## 2021-02-03 DIAGNOSIS — Z113 Encounter for screening for infections with a predominantly sexual mode of transmission: Secondary | ICD-10-CM

## 2021-02-03 DIAGNOSIS — Z79899 Other long term (current) drug therapy: Secondary | ICD-10-CM

## 2021-02-03 DIAGNOSIS — B20 Human immunodeficiency virus [HIV] disease: Secondary | ICD-10-CM

## 2021-02-05 ENCOUNTER — Other Ambulatory Visit: Payer: Self-pay

## 2021-02-05 ENCOUNTER — Other Ambulatory Visit: Payer: 59

## 2021-02-05 DIAGNOSIS — Z113 Encounter for screening for infections with a predominantly sexual mode of transmission: Secondary | ICD-10-CM

## 2021-02-05 DIAGNOSIS — B20 Human immunodeficiency virus [HIV] disease: Secondary | ICD-10-CM

## 2021-02-05 DIAGNOSIS — Z79899 Other long term (current) drug therapy: Secondary | ICD-10-CM

## 2021-02-08 LAB — CBC WITH DIFFERENTIAL/PLATELET
Absolute Monocytes: 448 cells/uL (ref 200–950)
Basophils Absolute: 50 cells/uL (ref 0–200)
Basophils Relative: 0.6 %
Eosinophils Absolute: 58 cells/uL (ref 15–500)
Eosinophils Relative: 0.7 %
HCT: 32.2 % — ABNORMAL LOW (ref 35.0–45.0)
Hemoglobin: 10.2 g/dL — ABNORMAL LOW (ref 11.7–15.5)
Lymphs Abs: 2316 cells/uL (ref 850–3900)
MCH: 22.5 pg — ABNORMAL LOW (ref 27.0–33.0)
MCHC: 31.7 g/dL — ABNORMAL LOW (ref 32.0–36.0)
MCV: 71.1 fL — ABNORMAL LOW (ref 80.0–100.0)
MPV: 9.5 fL (ref 7.5–12.5)
Monocytes Relative: 5.4 %
Neutro Abs: 5428 cells/uL (ref 1500–7800)
Neutrophils Relative %: 65.4 %
Platelets: 523 10*3/uL — ABNORMAL HIGH (ref 140–400)
RBC: 4.53 10*6/uL (ref 3.80–5.10)
RDW: 20.4 % — ABNORMAL HIGH (ref 11.0–15.0)
Total Lymphocyte: 27.9 %
WBC: 8.3 10*3/uL (ref 3.8–10.8)

## 2021-02-08 LAB — COMPLETE METABOLIC PANEL WITH GFR
AG Ratio: 1.6 (calc) (ref 1.0–2.5)
ALT: 10 U/L (ref 6–29)
AST: 11 U/L (ref 10–30)
Albumin: 4.3 g/dL (ref 3.6–5.1)
Alkaline phosphatase (APISO): 51 U/L (ref 31–125)
BUN: 9 mg/dL (ref 7–25)
CO2: 27 mmol/L (ref 20–32)
Calcium: 8.9 mg/dL (ref 8.6–10.2)
Chloride: 104 mmol/L (ref 98–110)
Creat: 0.62 mg/dL (ref 0.50–0.96)
Globulin: 2.7 g/dL (calc) (ref 1.9–3.7)
Glucose, Bld: 131 mg/dL — ABNORMAL HIGH (ref 65–99)
Potassium: 4.3 mmol/L (ref 3.5–5.3)
Sodium: 138 mmol/L (ref 135–146)
Total Bilirubin: 0.4 mg/dL (ref 0.2–1.2)
Total Protein: 7 g/dL (ref 6.1–8.1)
eGFR: 124 mL/min/{1.73_m2} (ref >=60)

## 2021-02-08 LAB — T-HELPER CELLS (CD4) COUNT (NOT AT ARMC)
Absolute CD4: 1371 cells/uL (ref 490–1740)
CD4 T Helper %: 53 % (ref 30–61)
Total lymphocyte count: 2564 cells/uL (ref 850–3900)

## 2021-02-08 LAB — HIV-1 RNA QUANT-NO REFLEX-BLD
HIV 1 RNA Quant: NOT DETECTED Copies/mL
HIV-1 RNA Quant, Log: NOT DETECTED Log cps/mL

## 2021-02-08 LAB — LIPID PANEL
Cholesterol: 138 mg/dL (ref <200)
HDL: 39 mg/dL — ABNORMAL LOW (ref >=50)
LDL Cholesterol (Calc): 86 mg/dL (calc)
Non-HDL Cholesterol (Calc): 99 mg/dL (calc) (ref <130)
Total CHOL/HDL Ratio: 3.5 (calc) (ref <5.0)
Triglycerides: 57 mg/dL (ref <150)

## 2021-02-08 LAB — RPR: RPR Ser Ql: NONREACTIVE

## 2021-02-11 ENCOUNTER — Other Ambulatory Visit (HOSPITAL_COMMUNITY): Payer: Self-pay

## 2021-02-19 ENCOUNTER — Encounter: Payer: 59 | Admitting: Infectious Disease

## 2021-02-23 ENCOUNTER — Other Ambulatory Visit (HOSPITAL_COMMUNITY): Payer: Self-pay

## 2021-02-24 ENCOUNTER — Other Ambulatory Visit (HOSPITAL_COMMUNITY): Payer: Self-pay

## 2021-03-08 ENCOUNTER — Other Ambulatory Visit (HOSPITAL_COMMUNITY): Payer: Self-pay

## 2021-03-30 ENCOUNTER — Other Ambulatory Visit (HOSPITAL_COMMUNITY): Payer: Self-pay

## 2021-03-31 ENCOUNTER — Other Ambulatory Visit (HOSPITAL_COMMUNITY): Payer: Self-pay

## 2021-04-02 ENCOUNTER — Other Ambulatory Visit: Payer: Self-pay

## 2021-04-02 ENCOUNTER — Other Ambulatory Visit (HOSPITAL_COMMUNITY): Payer: Self-pay

## 2021-04-02 ENCOUNTER — Encounter: Payer: Self-pay | Admitting: Infectious Disease

## 2021-04-02 ENCOUNTER — Ambulatory Visit (INDEPENDENT_AMBULATORY_CARE_PROVIDER_SITE_OTHER): Payer: 59 | Admitting: Infectious Disease

## 2021-04-02 VITALS — BP 129/83 | HR 78 | Temp 97.4°F | Ht 63.0 in | Wt 236.0 lb

## 2021-04-02 DIAGNOSIS — F339 Major depressive disorder, recurrent, unspecified: Secondary | ICD-10-CM | POA: Insufficient documentation

## 2021-04-02 DIAGNOSIS — B20 Human immunodeficiency virus [HIV] disease: Secondary | ICD-10-CM | POA: Diagnosis not present

## 2021-04-02 DIAGNOSIS — A6000 Herpesviral infection of urogenital system, unspecified: Secondary | ICD-10-CM | POA: Diagnosis not present

## 2021-04-02 DIAGNOSIS — E1169 Type 2 diabetes mellitus with other specified complication: Secondary | ICD-10-CM | POA: Diagnosis not present

## 2021-04-02 DIAGNOSIS — N900 Mild vulvar dysplasia: Secondary | ICD-10-CM

## 2021-04-02 DIAGNOSIS — F331 Major depressive disorder, recurrent, moderate: Secondary | ICD-10-CM

## 2021-04-02 MED ORDER — DULOXETINE HCL 30 MG PO CPEP
30.0000 mg | ORAL_CAPSULE | Freq: Every day | ORAL | 11 refills | Status: DC
  Filled 2021-04-02: qty 30, 30d supply, fill #0

## 2021-04-02 MED ORDER — BIKTARVY 50-200-25 MG PO TABS
1.0000 | ORAL_TABLET | Freq: Every day | ORAL | 11 refills | Status: DC
  Filled 2021-04-02 (×2): qty 30, 30d supply, fill #0
  Filled 2021-04-28 (×2): qty 30, 30d supply, fill #1
  Filled 2021-06-08 – 2021-06-11 (×2): qty 30, 30d supply, fill #2
  Filled 2021-07-14: qty 30, 30d supply, fill #3
  Filled 2021-08-05: qty 30, 30d supply, fill #4
  Filled 2021-09-21: qty 30, 30d supply, fill #5

## 2021-04-02 NOTE — Progress Notes (Signed)
Subjective:  Chief complaint: Depressive symptoms that have worsened recently  Patient ID: Kelly Hunter, female    DOB: October 02, 1990, 31 y.o.   MRN: [id number]  HPI  31year-old African-American lady with HIV disease with perfect virological suppression on TRIUMEQ --> Biktarvy  She had  become pregnant was in the first few weeks of her pregnancy was seen by my partner Dr. West Bali, and changed to twice a day Isentress plus once daily Truvada.  She  Tolerated these  medications well but did    She did not like taking multiple pills multiple times per day.  I reviewed with her the data from [place] and current updated DHHS guidelines which include TIVICAY as a preferred regimen throughout pregnancy and including for women he might become pregnant.  I offered options of TIVICAY plus Truvada versus TIVICAY plus DESCOVY.  We ended up going for TIVICAY and Truvada  She has remained perfectly suppressed on this regimen as well.  She refused COVID-19 vaccination despite my counseling her extensively on the risks of COVID-19 infection during pregnancy.  She also has other risk factors including obesity and her HIV infection.    Her viral load at the time of the delivery was 50 but this was measured in our hospital system and I am quite familiar with our phlebotomy staff and in-house hospital staff not promptly processing viral loads and so expect a viral load of 50 was really an artifact of processing and regardless is only a blip and would not pose any significant concern about her transmitting the virus to her newborn.  She has gone back onto [facility] since then. She related some significant depressive symptoms today she wonders if part of this may be due to postpartum depression.  She also has depression around the stigma of HIV diagnosis.  Apparently when she had her baby her HIV diagnosis was openly discussed in front of her and another family member without consenting with Brittney  to be able to talk without family member fortunately failed member already knew about her diagnosis.  She also felt [place] stigmatized when apparently CPS officials were talking to her about her HIV diagnosis and certainly while her son was being given antivirals.  We talked a bit about the depression and she does not have suicidal or homicidal ideation.  She has been on antidepressants before and is agreeable to starting them again and would like to see a counselor in person.  We also talked about long-acting therapy and how this might liberate her from the stigma of taking a pill every day.      Past Medical History:  Diagnosis Date   Acne    Allergy    Allegra   Asthma    mild; onset in childhood.  No hospitalizations.   ASTHMA, EXERCISE INDUCED    Cervical high risk HPV (human papillomavirus) test positive 07/2019   cervical cancer screening due in December, 2021   Chlamydia 03/03/2012   CIN I (cervical intraepithelial neoplasia I) 08/31/2013, 05/01/17   colposcopy by Toney Rakes; s/p CO2 laser treatment. repeat colposcopy by Dr. Quincy Simmonds.   Eczema    Elevated blood sugar 12/25/2017   Genital warts 08/31/2013   Aldara treatment; Toney Rakes.   Grieving 12/25/2017   Herpes    Hidradenitis    Hidradenitis axillaris 05/21/2014   HIV (human immunodeficiency virus infection) ([place])    HIV (human immunodeficiency virus) risk factors complicating pregnancy 76/22/6333   HSV-2 seropositive    Migraine headache without aura  Mixed anxiety depressive disorder 06/16/2020   Moderate episode of recurrent major depressive disorder ([place]) 10/24/2018   Substance abuse ([place])    Type 2 diabetes mellitus without complication, without long-term current use of insulin ([place]) 11/17/2014   Vaccine counseling 01/22/2020   Vaginal Pap smear, abnormal    VIN I (vulvar intraepithelial neoplasia I) 08/31/2013   s/p CO2 treatment; Fernandez/gyn.    Past Surgical History:  Procedure Laterality Date   BREAST  SURGERY  02/01/2008   Reduction   CESAREAN SECTION N/A 06/18/2020   Procedure: CESAREAN SECTION;  Surgeon: Sanjuana Kava, MD;  Location: [facility];  Service: Obstetrics;  Laterality: N/A;   CO2 LASER APPLICATION N/A 9/32/6712   Procedure: C02 laser of vagina and cervix;  Surgeon: Terrance Mass, MD;  Location: [facility];  Service: Gynecology;  Laterality: N/A;   INCISION AND DRAINAGE ABSCESS Left 03/09/2017   Procedure: INCISION AND DRAINAGE ABSCESS LEFT BREAST ABSCESS;  Surgeon: Erroll Luna, MD;  Location: [facility];  Service: General;  Laterality: Left;    Family History  Problem Relation Age of Onset   Diabetes Mother    Hypertension Mother    Hyperlipidemia Mother    Mental illness Mother    Pulmonary embolism Mother    Cancer Maternal Grandfather        ?   Cancer Paternal Aunt        Cervical or Ovarian per patient      Social History   Socioeconomic History   Marital status: Single    Spouse name: n/a   Number of children: 0   Years of education: Not on file   Highest education level: Not on file  Occupational History   Occupation: Freight forwarder    Comment: [employer]  Tobacco Use   Smoking status: Never   Smokeless tobacco: Never  Vaping Use   Vaping Use: Never used  Substance and Sexual Activity   Alcohol use: Not Currently    Alcohol/week: 0.0 standard drinks    Comment: socially   Drug use: No    Types: Marijuana   Sexual activity: Yes    Partners: Male    Birth control/protection: Condom    Comment: condoms everytime  Other Topics Concern   Not on file  Social History Narrative   Marital status: single;  Not dating in 2016.      Children: none; never pregnant.      Lives: with mom; dad in [place].     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      Education: previously in school; took medical leave in 02/2012; [employer] in [place].      Tobacco: vapor cigarette two months. Pt does not smoke.       Alcohol:  Socially; weekends;  no DUIs.      Drugs: marijuana daily.  Started at age 22.        Sexual activity: sexually active; 25 total partners; males; Chlamydia in 03/2012.  Genital warts 08/2013.  HIV diagnosis 12/2013.      Seatbelt: 100%      Guns:  none   Social Determinants of Radio broadcast assistant Strain: Not on file  Food Insecurity: Not on file  Transportation Needs: Not on file  Physical Activity: Not on file  Stress: Not on file  Social Connections: Not on file    Allergies  Allergen Reactions   Other Nausea And Vomiting and Swelling    SWELLING  REACTION UNSPECIFIED  Tree Nuts (Almonds)   Peanut Oil Nausea And Vomiting and Swelling    SWELLING REACTION UNSPECIFIED    Peanut-Containing Drug Products Nausea And Vomiting and Swelling    SWELLING REACTION UNSPECIFIED    Pyridium [Phenazopyridine Hcl] Nausea And Vomiting   Bactrim [Sulfamethoxazole-Trimethoprim] Rash     Current Outpatient Medications:    acetaminophen (TYLENOL) 500 MG tablet, Take 2 tablets (1,000 mg total) by mouth every 6 (six) hours., Disp: 30 tablet, Rfl: 0   bictegravir-emtricitabine-tenofovir AF (BIKTARVY) 50-200-25 MG TABS tablet, Take 1 tablet by mouth daily., Disp: 30 tablet, Rfl: 11   glyBURIDE (DIABETA) 5 MG tablet, Take 1 tablet (5 mg total) by mouth at bedtime., Disp: 30 tablet, Rfl: 1   iron polysaccharides (NIFEREX) 150 MG capsule, Take 1 capsule (150 mg total) by mouth daily., Disp: 30 capsule, Rfl: 0   metFORMIN (GLUCOPHAGE) 500 MG tablet, TAKE 1 TABLET(500 MG) BY MOUTH TWICE DAILY WITH A MEAL, Disp: 60 tablet, Rfl: 1   metFORMIN (GLUCOPHAGE) 500 MG tablet, Take 2 tablets (1,000 mg total) by mouth daily with breakfast., Disp: 30 tablet, Rfl: 1   NIFEdipine (ADALAT CC) 60 MG 24 hr tablet, Take 1 tablet (60 mg total) by mouth daily., Disp: 30 tablet, Rfl: 1   oxyCODONE (OXY IR/ROXICODONE) 5 MG immediate release tablet, Take 1-2 tablets (5-10 mg total) by mouth every 4 (four) hours as needed for moderate pain.  (Patient not taking: Reported on 08/05/2020), Disp: 30 tablet, Rfl: 0   valACYclovir (VALTREX) 1000 MG tablet, TAKE 1 TABLET BY MOUTH ONCE A DAY, Disp: 30 tablet, Rfl: 4   Review of Systems  Constitutional:  Negative for activity change, appetite change, chills, diaphoresis, fatigue, fever and unexpected weight change.  HENT:  Negative for congestion, rhinorrhea, sinus pressure, sneezing, sore throat and trouble swallowing.   Eyes:  Negative for photophobia and visual disturbance.  Respiratory:  Negative for cough, chest tightness, shortness of breath, wheezing and stridor.   Cardiovascular:  Negative for chest pain, palpitations and leg swelling.  Gastrointestinal:  Negative for abdominal distention, abdominal pain, anal bleeding, blood in stool, constipation, diarrhea, nausea and vomiting.  Genitourinary:  Negative for difficulty urinating, dysuria, flank pain and hematuria.  Musculoskeletal:  Negative for arthralgias, back pain, gait problem, joint swelling and myalgias.  Skin:  Negative for color change, pallor, rash and wound.  Neurological:  Negative for dizziness, tremors, weakness and light-headedness.  Hematological:  Negative for adenopathy. Does not bruise/bleed easily.  Psychiatric/Behavioral:  Positive for dysphoric mood. Negative for agitation, behavioral problems, confusion, decreased concentration and sleep disturbance. The patient is nervous/anxious.       Objective:   Physical Exam Constitutional:      General: She is not in acute distress.    Appearance: Normal appearance. She is well-developed. She is obese. She is not ill-appearing or diaphoretic.  HENT:     Head: Normocephalic and atraumatic.     Right Ear: Hearing and external ear normal.     Left Ear: Hearing and external ear normal.     Nose: No nasal deformity or rhinorrhea.  Eyes:     General: No scleral icterus.    Conjunctiva/sclera: Conjunctivae normal.     Right eye: Right conjunctiva is not injected.      Left eye: Left conjunctiva is not injected.     Pupils: Pupils are equal, round, and reactive to light.  Neck:     Vascular: No JVD.  Cardiovascular:     Rate  and Rhythm: Normal rate and regular rhythm.     Heart sounds: Normal heart sounds, S1 normal and S2 normal. No murmur heard.   No friction rub.  Abdominal:     General: Bowel sounds are normal. There is no distension.     Palpations: Abdomen is soft.     Tenderness: There is no abdominal tenderness.  Musculoskeletal:        General: Normal range of motion.     Right shoulder: Normal.     Left shoulder: Normal.     Cervical back: Normal range of motion and neck supple.     Right hip: Normal.     Left hip: Normal.     Right knee: Normal.     Left knee: Normal.  Lymphadenopathy:     Head:     Right side of head: No submandibular, preauricular or posterior auricular adenopathy.     Left side of head: No submandibular, preauricular or posterior auricular adenopathy.     Cervical: No cervical adenopathy.     Right cervical: No superficial or deep cervical adenopathy.    Left cervical: No superficial or deep cervical adenopathy.  Skin:    General: Skin is warm and dry.     Coloration: Skin is not pale.     Findings: No abrasion, bruising, ecchymosis, erythema, lesion or rash.     Nails: There is no clubbing.  Neurological:     Mental Status: She is alert and oriented to person, place, and time.     Sensory: No sensory deficit.     Coordination: Coordination normal.     Gait: Gait normal.  Psychiatric:        Attention and Perception: She is attentive.        Mood and Affect: Mood normal.        Speech: Speech normal.        Behavior: Behavior normal. Behavior is cooperative.        Thought Content: Thought content normal.        Judgment: Judgment normal.         Assessment & Plan:   HIV disease:  I have reviewed her most recent viral load which remains less than 20 and CD4 count which is healthy.  I have sent  in prescription for BIKTARVY which she is interested in long-acting Cabenuva and I think this would be liberating for her to not be on pills she does understand the 1.4% chance of urological failure despite adherence that occurred in clinical studies and this remains a risk for her on long-acting therapy that does not exist on oral therapy.  She is however quite interested in starting [place] we have initiated the process of getting approval for this for her.  Depression: I am initiating Cymbalta and getting in contact with Arbie Cookey for counseling.  Obesity: We discussed the DO-IT study initially seemed a bit reluctant but said she would think about it.  I think at this point in time however changing her over to long-acting therapy will be more important in terms of improving her mental health and liberating her from stigma of her diagnosis.  80s mellitus we will continue on her oral drugs and following up with PCP   I spent 42 minutes with the patient including than 50% of the time in face to face counseling of the patient providing supportive therapy for depressive symptoms reviewing HIV regimens including her current regimen of long-acting therapy with Cabenuva  along with review of  medical records in preparation for the visit and during the visit and in coordination of her care.

## 2021-04-03 ENCOUNTER — Other Ambulatory Visit (HOSPITAL_COMMUNITY): Payer: Self-pay

## 2021-04-08 ENCOUNTER — Other Ambulatory Visit (HOSPITAL_COMMUNITY): Payer: Self-pay

## 2021-04-08 ENCOUNTER — Ambulatory Visit: Payer: 59

## 2021-04-28 ENCOUNTER — Other Ambulatory Visit (HOSPITAL_COMMUNITY): Payer: Self-pay

## 2021-04-30 ENCOUNTER — Other Ambulatory Visit (HOSPITAL_COMMUNITY): Payer: Self-pay

## 2021-05-25 ENCOUNTER — Other Ambulatory Visit (HOSPITAL_COMMUNITY): Payer: Self-pay

## 2021-05-27 ENCOUNTER — Other Ambulatory Visit (HOSPITAL_COMMUNITY): Payer: Self-pay

## 2021-05-31 ENCOUNTER — Other Ambulatory Visit (HOSPITAL_COMMUNITY): Payer: Self-pay

## 2021-06-08 ENCOUNTER — Other Ambulatory Visit (HOSPITAL_COMMUNITY): Payer: Self-pay

## 2021-06-11 ENCOUNTER — Other Ambulatory Visit (HOSPITAL_COMMUNITY): Payer: Self-pay

## 2021-06-14 ENCOUNTER — Other Ambulatory Visit (HOSPITAL_COMMUNITY): Payer: Self-pay

## 2021-06-21 ENCOUNTER — Other Ambulatory Visit (HOSPITAL_COMMUNITY): Payer: Self-pay

## 2021-07-02 ENCOUNTER — Other Ambulatory Visit (HOSPITAL_COMMUNITY): Payer: Self-pay

## 2021-07-06 ENCOUNTER — Other Ambulatory Visit (HOSPITAL_COMMUNITY): Payer: Self-pay

## 2021-07-14 ENCOUNTER — Other Ambulatory Visit (HOSPITAL_COMMUNITY): Payer: Self-pay

## 2021-08-05 ENCOUNTER — Other Ambulatory Visit (HOSPITAL_COMMUNITY): Payer: Self-pay

## 2021-08-11 ENCOUNTER — Other Ambulatory Visit (HOSPITAL_COMMUNITY): Payer: Self-pay

## 2021-09-02 ENCOUNTER — Other Ambulatory Visit (HOSPITAL_COMMUNITY): Payer: Self-pay

## 2021-09-08 ENCOUNTER — Other Ambulatory Visit (HOSPITAL_COMMUNITY): Payer: Self-pay

## 2021-09-20 ENCOUNTER — Encounter: Payer: Self-pay | Admitting: Infectious Disease

## 2021-09-20 NOTE — Telephone Encounter (Signed)
Sounds good - thanks Megan!

## 2021-09-21 ENCOUNTER — Other Ambulatory Visit (HOSPITAL_COMMUNITY)
Admission: RE | Admit: 2021-09-21 | Discharge: 2021-09-21 | Disposition: A | Discharge status: Home / Self Care | Payer: 59 | Source: Ambulatory Visit | Attending: Infectious Disease | Admitting: Infectious Disease

## 2021-09-21 ENCOUNTER — Other Ambulatory Visit (HOSPITAL_COMMUNITY): Payer: Self-pay

## 2021-09-21 ENCOUNTER — Ambulatory Visit (INDEPENDENT_AMBULATORY_CARE_PROVIDER_SITE_OTHER): Payer: 59 | Admitting: Pharmacist

## 2021-09-21 ENCOUNTER — Other Ambulatory Visit: Payer: Self-pay

## 2021-09-21 DIAGNOSIS — B20 Human immunodeficiency virus [HIV] disease: Secondary | ICD-10-CM | POA: Insufficient documentation

## 2021-09-21 DIAGNOSIS — A6000 Herpesviral infection of urogenital system, unspecified: Secondary | ICD-10-CM | POA: Diagnosis not present

## 2021-09-21 MED ORDER — DESCOVY 200-25 MG PO TABS
1.0000 | ORAL_TABLET | Freq: Every day | ORAL | 2 refills | Status: DC
  Filled 2021-09-21: qty 30, 30d supply, fill #0
  Filled 2021-10-14: qty 30, 30d supply, fill #1
  Filled 2021-11-11: qty 30, 30d supply, fill #2

## 2021-09-21 MED ORDER — TIVICAY 50 MG PO TABS
50.0000 mg | ORAL_TABLET | Freq: Every day | ORAL | 2 refills | Status: DC
  Filled 2021-09-21: qty 30, 30d supply, fill #0
  Filled 2021-10-14: qty 30, 30d supply, fill #1
  Filled 2021-11-11: qty 30, 30d supply, fill #2

## 2021-09-21 MED ORDER — VALACYCLOVIR HCL 1 G PO TABS
1000.0000 mg | ORAL_TABLET | Freq: Every day | ORAL | 0 refills | Status: DC
  Filled 2021-09-21: qty 30, 30d supply, fill #0

## 2021-09-21 NOTE — Progress Notes (Signed)
09/21/2021  HPI: Kelly Hunter is a 31 y.o. female who presents to the [facility] clinic for HIV follow-up.  Patient Active Problem List   Diagnosis Date Noted   Major depression, recurrent ([place]) 04/02/2021   Acute blood loss anemia (ABLA) 06/19/2020   Encounter for induction of labor 06/17/2020   Severe preeclampsia 06/17/2020   H/O anxiety disorder 06/17/2020   H/O: depression 06/17/2020   Vaccine counseling 01/22/2020   HIV (human immunodeficiency virus) risk factors complicating pregnancy 25/36/6440   Genital herpes 04/27/2016   Type 2 diabetes mellitus ([place]) 11/17/2014   HIV disease ([place]) 01/15/2014   Eczema 10/22/2013   Condyloma acuminatum of vulva 06/18/2013   Dysplasia of cervix, low grade (CIN 1) 06/18/2013   Maternal obesity affecting pregnancy, antepartum 03/30/2006   ASTHMA, EXERCISE INDUCED 03/30/2006    Patient's Medications  New Prescriptions   No medications on file  Previous Medications   ACETAMINOPHEN (TYLENOL) 500 MG TABLET    Take 2 tablets (1,000 mg total) by mouth every 6 (six) hours.   BICTEGRAVIR-EMTRICITABINE-TENOFOVIR AF (BIKTARVY) 50-200-25 MG TABS TABLET    Take 1 tablet by mouth daily.   DULOXETINE (CYMBALTA) 30 MG CAPSULE    Take 1 capsule by mouth daily.   GLYBURIDE (DIABETA) 5 MG TABLET    Take 1 tablet (5 mg total) by mouth at bedtime.   IRON POLYSACCHARIDES (NIFEREX) 150 MG CAPSULE    Take 1 capsule (150 mg total) by mouth daily.   METFORMIN (GLUCOPHAGE) 500 MG TABLET    TAKE 1 TABLET(500 MG) BY MOUTH TWICE DAILY WITH A MEAL   METFORMIN (GLUCOPHAGE) 500 MG TABLET    Take 2 tablets (1,000 mg total) by mouth daily with breakfast.   NIFEDIPINE (ADALAT CC) 60 MG 24 HR TABLET    Take 1 tablet (60 mg total) by mouth daily.   OXYCODONE (OXY IR/ROXICODONE) 5 MG IMMEDIATE RELEASE TABLET    Take 1-2 tablets (5-10 mg total) by mouth every 4 (four) hours as needed for moderate pain.  Modified Medications   No medications on file  Discontinued  Medications   No medications on file    Allergies: Allergies  Allergen Reactions   Cashew Nut Oil     Pt states she is allergic to cashews and to all nuts   Other Nausea And Vomiting and Swelling    SWELLING REACTION UNSPECIFIED  Tree Nuts (Almonds)   Peanut Oil Nausea And Vomiting and Swelling    SWELLING REACTION UNSPECIFIED    Peanut-Containing Drug Products Nausea And Vomiting and Swelling    SWELLING REACTION UNSPECIFIED    Pyridium [Phenazopyridine Hcl] Nausea And Vomiting   Bactrim [Sulfamethoxazole-Trimethoprim] Rash    Past Medical History: Past Medical History:  Diagnosis Date   Acne    Allergy    Allegra   Asthma    mild; onset in childhood.  No hospitalizations.   ASTHMA, EXERCISE INDUCED    Cervical high risk HPV (human papillomavirus) test positive 07/2019   cervical cancer screening due in December, 2021   Chlamydia 03/03/2012   CIN I (cervical intraepithelial neoplasia I) 08/31/2013, 05/01/17   colposcopy by Toney Rakes; s/p CO2 laser treatment. repeat colposcopy by Dr. Quincy Simmonds.   Eczema    Elevated blood sugar 12/25/2017   Genital warts 08/31/2013   Aldara treatment; Toney Rakes.   Grieving 12/25/2017   Herpes    Hidradenitis    Hidradenitis axillaris 05/21/2014   HIV (human immunodeficiency virus infection) ([place])    HIV (human immunodeficiency  virus) risk factors complicating pregnancy 09/81/1914   HSV-2 seropositive    Migraine headache without aura    Mixed anxiety depressive disorder 06/16/2020   Moderate episode of recurrent major depressive disorder ([place]) 10/24/2018   Substance abuse ([place])    Type 2 diabetes mellitus without complication, without long-term current use of insulin ([place]) 11/17/2014   Vaccine counseling 01/22/2020   Vaginal Pap smear, abnormal    VIN I (vulvar intraepithelial neoplasia I) 08/31/2013   s/p CO2 treatment; Fernandez/gyn.    Social History: Social History   Socioeconomic History   Marital status: Single    Spouse name: n/a    Number of children: 0   Years of education: Not on file   Highest education level: Not on file  Occupational History   Occupation: Freight forwarder    Comment: [employer]  Tobacco Use   Smoking status: Never   Smokeless tobacco: Never  Vaping Use   Vaping Use: Never used  Substance and Sexual Activity   Alcohol use: Not Currently    Comment: occasionally   Drug use: No    Types: Marijuana   Sexual activity: Yes    Partners: Male    Birth control/protection: Condom    Comment: declined condoms  Other Topics Concern   Not on file  Social History Narrative   Marital status: single;  Not dating in 2016.      Children: none; never pregnant.      Lives: with mom; dad in [place].     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      Education: previously in school; took medical leave in 02/2012; [employer] in [place].      Tobacco: vapor cigarette two months. Pt does not smoke.       Alcohol:  Socially; weekends; no DUIs.      Drugs: marijuana daily.  Started at age 43.        Sexual activity: sexually active; 25 total partners; males; Chlamydia in 03/2012.  Genital warts 08/2013.  HIV diagnosis 12/2013.      Seatbelt: 100%      Guns:  none   Social Determinants of Health   Financial Resource Strain: Not on file  Food Insecurity: No Food Insecurity (01/15/2020)   Hunger Vital Sign    Worried About Running Out of Food in the Last Year: Never true    Ran Out of Food in the Last Year: Never true  Transportation Needs: Not on file  Physical Activity: Not on file  Stress: Not on file  Social Connections: Not on file    Labs: Lab Results  Component Value Date   HIV1RNAQUANT Not Detected 02/05/2021   HIV1RNAQUANT <20 (H) 08/05/2020   HIV1RNAQUANT 50 06/16/2020   CD4TABS 977 01/08/2020   CD4TABS 1,199 11/25/2019   CD4TABS 1,673 10/14/2019    RPR and STI Lab Results  Component Value Date   LABRPR NON-REACTIVE 02/05/2021   LABRPR NON REACTIVE  06/16/2020   LABRPR NON-REACTIVE 05/06/2020   LABRPR NON-REACTIVE 01/08/2020   LABRPR NON-REACTIVE 11/25/2019    STI Results GC GC CT CT  03/09/2020 12:00 AM   Negative       11/25/2019  2:33 PM Negative   Negative    10/28/2019  2:04 PM Negative   Negative    07/05/2019 11:16 AM Negative   Negative    08/08/2018 12:00 AM Negative   Negative    11/16/2017 12:00 AM Negative   Negative  04/10/2017 12:00 AM Negative   Negative    07/19/2016 12:00 AM Negative   Negative    07/27/2015 12:00 AM Negative   Negative    04/29/2015  5:27 PM  NOT DETECTED   NOT DETECTED   11/12/2014 12:00 AM Negative   Negative    05/21/2014 12:00 AM Negative   Negative    12/31/2013 12:00 AM NG: Negative   CT: Negative    12/16/2013  3:30 PM  NEGATIVE   NEGATIVE   12/11/2012 12:33 PM  NEGATIVE   NEGATIVE   09/04/2012  1:26 PM  NEGATIVE   NEGATIVE   03/03/2012  1:24 PM  NEGATIVE   POSITIVE   06/17/2011 10:51 AM   NEGATIVE       This result is from an external source.    Hepatitis B Lab Results  Component Value Date   HEPBSAB POS (A) 12/31/2013   HEPBSAG Negative 11/21/2019   HEPBCAB NON REACTIVE 12/31/2013   Hepatitis C No results found for: "HEPCAB", "HCVRNAPCRQN" Hepatitis A Lab Results  Component Value Date   HAV NON REACTIVE 12/31/2013   Lipids: Lab Results  Component Value Date   CHOL 138 02/05/2021   TRIG 57 02/05/2021   HDL 39 (L) 02/05/2021   CHOLHDL 3.5 02/05/2021   VLDL 21 07/19/2016   LDLCALC 86 02/05/2021    Current HIV Regimen: Biktarvy   Assessment: Kelly Hunter is a 31 YO pregnant female presenting to clinic today for a medication change given her pregnancy. Her most recent viral load was undetectable and her CD4 was in the 1300s in January of 2023.  Patient was counseled side effects of her new regimen would be very similar to [place] and likely would have generalized side effects in immediate period following the switch, if any.   Patient also asked if someone in  the clinic could reach out to the [facility] regarding her HIV status with lab work as confirmation with attention to Butch Penny (Fax: [phone]). I spoke with Juliann Pulse who said she could reach out to [facility] with [facility] regarding this request.   All patient's questions were answered. She also asked if a refill of her valacyclovir could be sent to the pharmacy.   Plan: - STOP Biktarvy  - START Tivicay + Descovy daily - Refill Valacyclovir 1,000 mg PO daily  - F/U HIV RNA, CD4  - F/U in 1 month with Alfonse Spruce, PharmD, BCIDP 10/26/21 - Call with any questions or concerns  Adria Dill, PharmD PGY-2 Infectious Diseases Resident  09/21/2021 4:19 PM

## 2021-09-22 ENCOUNTER — Other Ambulatory Visit (HOSPITAL_COMMUNITY): Payer: Self-pay

## 2021-09-22 LAB — T-HELPER CELLS (CD4) COUNT (NOT AT ARMC)
CD4 % Helper T Cell: 53 % (ref 33–65)
CD4 T Cell Abs: 1378 /uL (ref 400–1790)

## 2021-09-23 LAB — URINE CYTOLOGY ANCILLARY ONLY
Chlamydia: NEGATIVE
Comment: NEGATIVE
Comment: NORMAL
Neisseria Gonorrhea: NEGATIVE

## 2021-09-24 LAB — HIV-1 RNA QUANT-NO REFLEX-BLD
HIV 1 RNA Quant: NOT DETECTED Copies/mL
HIV-1 RNA Quant, Log: NOT DETECTED Log cps/mL

## 2021-10-06 ENCOUNTER — Inpatient Hospital Stay (HOSPITAL_COMMUNITY)
Admission: AD | Admit: 2021-10-06 | Discharge: 2021-10-06 | Disposition: A | Discharge status: Home / Self Care | Payer: 59 | Attending: Obstetrics and Gynecology | Admitting: Obstetrics and Gynecology

## 2021-10-06 ENCOUNTER — Encounter (HOSPITAL_COMMUNITY): Payer: Self-pay | Admitting: Obstetrics and Gynecology

## 2021-10-06 ENCOUNTER — Other Ambulatory Visit: Payer: Self-pay

## 2021-10-06 DIAGNOSIS — O219 Vomiting of pregnancy, unspecified: Secondary | ICD-10-CM | POA: Diagnosis not present

## 2021-10-06 DIAGNOSIS — O98511 Other viral diseases complicating pregnancy, first trimester: Secondary | ICD-10-CM | POA: Insufficient documentation

## 2021-10-06 DIAGNOSIS — Z3A08 8 weeks gestation of pregnancy: Secondary | ICD-10-CM | POA: Diagnosis not present

## 2021-10-06 DIAGNOSIS — U071 COVID-19: Secondary | ICD-10-CM | POA: Diagnosis not present

## 2021-10-06 DIAGNOSIS — J069 Acute upper respiratory infection, unspecified: Secondary | ICD-10-CM | POA: Diagnosis not present

## 2021-10-06 LAB — RESP PANEL BY RT-PCR (FLU A&B, COVID) ARPGX2
Influenza A by PCR: NEGATIVE
Influenza B by PCR: NEGATIVE
SARS Coronavirus 2 by RT PCR: POSITIVE — AB

## 2021-10-06 LAB — POCT PREGNANCY, URINE: Preg Test, Ur: POSITIVE — AB

## 2021-10-06 LAB — GROUP A STREP BY PCR: Group A Strep by PCR: NOT DETECTED

## 2021-10-06 MED ORDER — PROMETHAZINE HCL 12.5 MG PO TABS: 12.5000 mg | ORAL_TABLET | Freq: Four times a day (QID) | ORAL | 0 refills | Status: DC | PRN

## 2021-10-06 NOTE — MAU Note (Signed)
Kelly Hunter is a 31 y.o. at 10w6dhere in MAU reporting: had + covid test at home. Started having a sore throat yesterday, headache, chills, and body pain. And having some episodes of emesis. States she came to the hospital because she wasn't sure what she was supposed to do.   LMP: 08/05/2021  Onset of complaint: yesterday  Pain score: body aches 8/10, headache 10/10  Vitals:   10/06/21 0930  BP: (!) 143/81  Pulse: (!) 118  Resp: 16  Temp: 99.9 F (37.7 C)  SpO2: 98%     Lab orders placed from triage: upt

## 2021-10-06 NOTE — MAU Provider Note (Signed)
History     CSN: [id number]  Arrival date and time: 10/06/21 0900   Event Date/Time   First Provider Initiated Contact with Patient 10/06/21 [phone]      Chief Complaint  Patient presents with   Headache   Sore Throat   31 y.o. G3P0020 '@[redacted]w[redacted]d'$  by LMP presenting with congestion, cough, sore throat, and body aches since last night. Reports recent exposure to a family member that tested positive for Covid. She also had a + home Covid test. Denies fever or trouble breathing. Endorses N/V. No pregnancy complaints.    OB History     Gravida  3   Para  0   Term  0   Preterm  0   AB  1   Living  0      SAB  0   IAB  1   Ectopic  0   Multiple  0   Live Births  0           Past Medical History:  Diagnosis Date   Acne    Allergy    [place]   Asthma    mild; onset in childhood.  No hospitalizations.   ASTHMA, EXERCISE INDUCED    Cervical high risk HPV (human papillomavirus) test positive 07/2019   cervical cancer screening due in December, 2021   Chlamydia 03/03/2012   CIN I (cervical intraepithelial neoplasia I) 08/31/2013, 05/01/17   colposcopy by Toney Rakes; s/p CO2 laser treatment. repeat colposcopy by Dr. Quincy Simmonds.   Eczema    Elevated blood sugar 12/25/2017   Genital warts 08/31/2013   [place] treatment; Toney Rakes.   Grieving 12/25/2017   Herpes    Hidradenitis    Hidradenitis axillaris 05/21/2014   HIV (human immunodeficiency virus infection) ([place])    HIV (human immunodeficiency virus) risk factors complicating pregnancy 81/10/3157   HSV-2 seropositive    Migraine headache without aura    Mixed anxiety depressive disorder 06/16/2020   Moderate episode of recurrent major depressive disorder ([place]) 10/24/2018   Substance abuse ([place])    Type 2 diabetes mellitus without complication, without long-term current use of insulin ([place]) 11/17/2014   Vaccine counseling 01/22/2020   Vaginal Pap smear, abnormal    VIN I (vulvar intraepithelial neoplasia I) 08/31/2013   s/p  CO2 treatment; [facility]andez/gyn.    Past Surgical History:  Procedure Laterality Date   BREAST SURGERY  02/01/2008   Reduction   CESAREAN SECTION N/A 06/18/2020   Procedure: CESAREAN SECTION;  Surgeon: Sanjuana Kava, MD;  Location: [facility];  Service: Obstetrics;  Laterality: N/A;   CO2 LASER APPLICATION N/A 4/58/5929   Procedure: C02 laser of vagina and cervix;  Surgeon: Terrance Mass, MD;  Location: [facility];  Service: Gynecology;  Laterality: N/A;   INCISION AND DRAINAGE ABSCESS Left 03/09/2017   Procedure: INCISION AND DRAINAGE ABSCESS LEFT BREAST ABSCESS;  Surgeon: Erroll Luna, MD;  Location: [facility];  Service: General;  Laterality: Left;    Family History  Problem Relation Age of Onset   Diabetes Mother    Hypertension Mother    Hyperlipidemia Mother    Mental illness Mother    Pulmonary embolism Mother    Cancer Maternal Grandfather        ?   Cancer Paternal Aunt        Cervical or Ovarian per patient    Social History   Tobacco Use   Smoking status: Never   Smokeless tobacco: Never  Vaping Use   Vaping Use: Never used  Substance Use Topics   Alcohol use: Not Currently    Comment: occasionally   Drug use: No    Types: Marijuana    Allergies:  Allergies  Allergen Reactions   Cashew Nut Oil     Pt states she is allergic to cashews and to all nuts   Other Nausea And Vomiting and Swelling    SWELLING REACTION UNSPECIFIED  Tree Nuts (Almonds)   Peanut Oil Nausea And Vomiting and Swelling    SWELLING REACTION UNSPECIFIED    Peanut-Containing Drug Products Nausea And Vomiting and Swelling    SWELLING REACTION UNSPECIFIED    Pyridium [Phenazopyridine Hcl] Nausea And Vomiting   Bactrim [Sulfamethoxazole-Trimethoprim] Rash    No medications prior to admission.    Review of Systems  Constitutional:  Negative for chills and fever.  HENT:  Positive for congestion and sore throat.   Respiratory:  Positive for cough. Negative for shortness of breath.    Cardiovascular:  Negative for chest pain.  Gastrointestinal:  Positive for nausea and vomiting. Negative for abdominal pain.  Genitourinary:  Negative for vaginal bleeding.  Musculoskeletal:  Positive for myalgias.   Physical Exam   Blood pressure (!) 143/81, pulse (!) 118, temperature 99.9 F (37.7 C), temperature source Oral, resp. rate 16, height '5\' 3"'$  (1.6 m), weight 100.6 kg, last menstrual period 08/05/2021, SpO2 98 %, unknown if currently breastfeeding.  Physical Exam Vitals and nursing note reviewed.  Constitutional:      General: She is not in acute distress.    Appearance: Normal appearance.  HENT:     Head: Normocephalic and atraumatic.  Cardiovascular:     Rate and Rhythm: Tachycardia present.  Pulmonary:     Effort: Pulmonary effort is normal. No respiratory distress.  Musculoskeletal:        General: Normal range of motion.     Cervical back: Normal range of motion.  Neurological:     General: No focal deficit present.     Mental Status: She is alert and oriented to person, place, and time.  Psychiatric:        Mood and Affect: Mood normal.        Behavior: Behavior normal.    Results for orders placed or performed during the hospital encounter of 10/06/21 (from the past 24 hour(s))  Pregnancy, urine POC     Status: Abnormal   Collection Time: 10/06/21  9:18 AM  Result Value Ref Range   Preg Test, Ur POSITIVE (A) NEGATIVE  Resp Panel by RT-PCR (Flu A&B, Covid) Anterior Nasal Swab     Status: Abnormal   Collection Time: 10/06/21  9:36 AM   Specimen: Anterior Nasal Swab  Result Value Ref Range   SARS Coronavirus 2 by RT PCR POSITIVE (A) NEGATIVE   Influenza A by PCR NEGATIVE NEGATIVE   Influenza B by PCR NEGATIVE NEGATIVE  Group A Strep by PCR     Status: None   Collection Time: 10/06/21  9:36 AM   Specimen: Anterior Nasal Swab; Sterile Swab  Result Value Ref Range   Group A Strep by PCR NOT DETECTED NOT DETECTED   MAU Course  Procedures  MDM Labs  ordered and reviewed. Confirmed Covid positive. Discussed quarantine x5 days and supportive measures. Return to ED for SOB or CP. Stable for discharge home.   Assessment and Plan   1. Upper respiratory virus   2. [redacted] weeks gestation of pregnancy   3. Nausea and vomiting during pregnancy   4. COVID-19 virus infection  Discharge home [facility] up [facility] as scheduled Return precautions Rx Phenergan  Allergies as of 10/06/2021       Reactions   Cashew Nut Oil    Pt states she is allergic to cashews and to all nuts   Other Nausea And Vomiting, Swelling   SWELLING REACTION UNSPECIFIED  Tree Nuts (Almonds)   Peanut Oil Nausea And Vomiting, Swelling   SWELLING REACTION UNSPECIFIED    Peanut-containing Drug Products Nausea And Vomiting, Swelling   SWELLING REACTION UNSPECIFIED    Pyridium [phenazopyridine Hcl] Nausea And Vomiting   Bactrim [sulfamethoxazole-trimethoprim] Rash        Medication List     STOP taking these medications    iron polysaccharides 150 MG capsule Commonly known as: NIFEREX   oxyCODONE 5 MG immediate release tablet Commonly known as: Oxy IR/ROXICODONE       TAKE these medications    acetaminophen 500 MG tablet Commonly known as: TYLENOL Take 2 tablets (1,000 mg total) by mouth every 6 (six) hours.   Biktarvy 50-200-25 MG Tabs tablet Generic drug: bictegravir-emtricitabine-tenofovir AF Take 1 tablet by mouth daily.   Descovy 200-25 MG tablet Generic drug: emtricitabine-tenofovir AF Take 1 tablet by mouth daily.   DULoxetine 30 MG capsule Commonly known as: Cymbalta Take 1 capsule by mouth daily.   glyBURIDE 5 MG tablet Commonly known as: DIABETA Take 1 tablet (5 mg total) by mouth at bedtime.   metFORMIN 500 MG tablet Commonly known as: GLUCOPHAGE TAKE 1 TABLET(500 MG) BY MOUTH TWICE DAILY WITH A MEAL What changed: Another medication with the same name was removed. Continue taking this medication, and follow the directions you see  here.   NIFEdipine 60 MG 24 hr tablet Commonly known as: ADALAT CC Take 1 tablet (60 mg total) by mouth daily.   promethazine 12.5 MG tablet Commonly known as: PHENERGAN Take 1-2 tablets (12.5-25 mg total) by mouth every 6 (six) hours as needed for nausea or vomiting.   Tivicay 50 MG tablet Generic drug: dolutegravir Take 1 tablet (50 mg total) by mouth daily.   valACYclovir 1000 MG tablet Commonly known as: Valtrex Take 1 tablet (1,000 mg total) by mouth daily.       Julianne Handler, CNM 10/06/2021, 10:46 AM

## 2021-10-06 NOTE — Discharge Instructions (Signed)

## 2021-10-12 ENCOUNTER — Other Ambulatory Visit (HOSPITAL_COMMUNITY): Payer: Self-pay

## 2021-10-14 ENCOUNTER — Other Ambulatory Visit (HOSPITAL_COMMUNITY): Payer: Self-pay

## 2021-10-18 ENCOUNTER — Other Ambulatory Visit (HOSPITAL_COMMUNITY): Payer: Self-pay

## 2021-10-26 ENCOUNTER — Ambulatory Visit: Payer: 59 | Admitting: Pharmacist

## 2021-10-26 ENCOUNTER — Other Ambulatory Visit: Payer: Self-pay | Admitting: Pharmacist

## 2021-10-26 ENCOUNTER — Other Ambulatory Visit: Payer: 59

## 2021-10-26 ENCOUNTER — Other Ambulatory Visit: Payer: Self-pay

## 2021-10-26 ENCOUNTER — Telehealth: Payer: Self-pay | Admitting: Pharmacist

## 2021-10-26 DIAGNOSIS — B20 Human immunodeficiency virus [HIV] disease: Secondary | ICD-10-CM

## 2021-10-26 LAB — OB RESULTS CONSOLE ABO/RH: RH Type: POSITIVE

## 2021-10-26 LAB — OB RESULTS CONSOLE RUBELLA ANTIBODY, IGM: Rubella: IMMUNE

## 2021-10-26 LAB — HEPATITIS C ANTIBODY: HCV Ab: NEGATIVE

## 2021-10-26 LAB — OB RESULTS CONSOLE ANTIBODY SCREEN: Antibody Screen: NEGATIVE

## 2021-10-26 LAB — OB RESULTS CONSOLE HEPATITIS B SURFACE ANTIGEN: Hepatitis B Surface Ag: NEGATIVE

## 2021-10-26 NOTE — Telephone Encounter (Signed)
Patient called asking why she had an appointment today and was concerned about making it in time as she also has a prenatal visit today. Reviewed that we were simply going to follow up on her transition to a different regimen in pregnancy. We will change appointment to lab only and collect HIV RNA and BMP today at her convenience.  Will need to be scheduled with Dr. Tommy Medal in the next few months.  Alfonse Spruce, PharmD, CPP, [place] Clinical Pharmacist Practitioner Infectious [facility]

## 2021-10-28 LAB — BASIC METABOLIC PANEL
BUN: 8 mg/dL (ref 7–25)
CO2: 26 mmol/L (ref 20–32)
Calcium: 9.7 mg/dL (ref 8.6–10.2)
Chloride: 101 mmol/L (ref 98–110)
Creat: 0.64 mg/dL (ref 0.50–0.96)
Glucose, Bld: 107 mg/dL — ABNORMAL HIGH (ref 65–99)
Potassium: 4.2 mmol/L (ref 3.5–5.3)
Sodium: 136 mmol/L (ref 135–146)

## 2021-10-28 LAB — HIV-1 RNA QUANT-NO REFLEX-BLD
HIV 1 RNA Quant: NOT DETECTED Copies/mL
HIV-1 RNA Quant, Log: NOT DETECTED Log cps/mL

## 2021-11-09 ENCOUNTER — Other Ambulatory Visit (HOSPITAL_COMMUNITY): Payer: Self-pay

## 2021-11-09 ENCOUNTER — Ambulatory Visit: Payer: 59 | Admitting: Skilled Nursing Facility1

## 2021-11-10 ENCOUNTER — Encounter: Payer: Self-pay | Admitting: Skilled Nursing Facility1

## 2021-11-10 ENCOUNTER — Ambulatory Visit: Payer: 59

## 2021-11-10 ENCOUNTER — Encounter: Payer: 59 | Attending: Obstetrics and Gynecology | Admitting: Skilled Nursing Facility1

## 2021-11-10 DIAGNOSIS — O24419 Gestational diabetes mellitus in pregnancy, unspecified control: Secondary | ICD-10-CM | POA: Diagnosis present

## 2021-11-10 DIAGNOSIS — O24919 Unspecified diabetes mellitus in pregnancy, unspecified trimester: Secondary | ICD-10-CM

## 2021-11-10 DIAGNOSIS — Z3A00 Weeks of gestation of pregnancy not specified: Secondary | ICD-10-CM | POA: Insufficient documentation

## 2021-11-10 NOTE — Progress Notes (Signed)
Pt states she is allergic to tree nuts and peanuts.   Pt states she is not taking any DM medications.   Other Dx: Allergies, Anxiety, asthma, depression, DM, HIV/AIDS, substance abuse, migraine   Pt states she has been more depressed with this pregnancy due to trying to find a home.   Pt states she was recently diagnosed with diabetes about a year ago.  Pt states she does not know her pre-pregnancy A1C or blood sugar. Pt states she was not testing her blood sugar pre pre-pregnancy.  Pt states she is about 4 months along with a due date of April 5th.    Pt states she has been trying to make her meals from home. Pt states she used to cook but has been too depressed to find joy in it.   Pt states her A1C is 6.6.   Goals: -floss daily -brush 2 times a day -eat breakfast daily -reach out to a mental health professional and tell your finance you are struggling with depression to better help you   Diabetes Self-Management Education  Visit Type: First/Initial   11/10/2021  Kelly Hunter, identified by name and date of birth, is a 31 y.o. female with a diagnosis of Diabetes: Type 2.   ASSESSMENT  Height '5\' 3"'$  (1.6 m), weight 222 lb 3.2 oz (100.8 kg), last menstrual period 08/05/2021, unknown if currently breastfeeding. Body mass index is 39.36 kg/m.   Diabetes Self-Management Education - 11/10/21 1507       Visit Information   Visit Type First/Initial      Initial Visit   Diabetes Type Type 2    Date Diagnosed 2022    Are you currently following a meal plan? No    Are you taking your medications as prescribed? Not on Medications      Health Coping   How would you rate your overall health? Fair      Psychosocial Assessment   Patient Belief/Attitude about Diabetes Motivated to manage diabetes    What is the hardest part about your diabetes right now, causing you the most concern, or is the most worrisome to you about your diabetes?   Making healty food and beverage  choices    Self-care barriers None    Self-management support Friends;Family    Patient Concerns Nutrition/Meal planning    Special Needs None    Learning Readiness Contemplating    How often do you need to have someone help you when you read instructions, pamphlets, or other written materials from your doctor or pharmacy? 3 - Sometimes      Pre-Education Assessment   Patient understands the diabetes disease and treatment process. Needs Instruction    Patient understands incorporating nutritional management into lifestyle. Needs Instruction    Patient undertands incorporating physical activity into lifestyle. Needs Instruction    Patient understands using medications safely. Needs Instruction    Patient understands monitoring blood glucose, interpreting and using results Needs Instruction    Patient understands prevention, detection, and treatment of acute complications. Needs Instruction    Patient understands prevention, detection, and treatment of chronic complications. Needs Instruction    Patient understands how to develop strategies to address psychosocial issues. Needs Instruction    Patient understands how to develop strategies to promote health/change behavior. Needs Instruction      Complications   Last HgB A1C per patient/outside source 6.6 %    How often do you check your blood sugar? 0 times/day (not testing)    Have  you had a dilated eye exam in the past 12 months? No    Have you had a dental exam in the past 12 months? No    Are you checking your feet? Yes    How many days per week are you checking your feet? 7      Dietary Intake   Breakfast skipped    Lunch chicken spinach pasta + alfredo    Snack (afternoon) peach    Dinner chicken sandwich and homeade fries    Beverage(s) 100% fruit juice, water      Activity / Exercise   Activity / Exercise Type ADL's    How many days per week do you exercise? 0    How many minutes per day do you exercise? 0    Total minutes  per week of exercise 0      Patient Education   Healthy Eating Role of diet in the treatment of diabetes and the relationship between the three main macronutrients and blood glucose level    Medications Reviewed patients medication for diabetes, action, purpose, timing of dose and side effects.    Monitoring Taught/evaluated SMBG meter.;Purpose and frequency of SMBG.;Daily foot exams;Yearly dilated eye exam    Diabetes Stress and Support Identified and addressed patients feelings and concerns about diabetes;Worked with patient to identify barriers to care and solutions;Role of stress on diabetes;Brainstormed with patient on coping mechanisms for social situations, getting support from significant others, dealing with feelings about diabetes    Lifestyle and Health Coping Lifestyle issues that need to be addressed for better diabetes care;Helped patient develop diabetes management plan for (enter comment)      Individualized Goals (developed by patient)   Nutrition Follow meal plan discussed;General guidelines for healthy choices and portions discussed    Monitoring  Test my blood glucose as discussed;Test blood glucose pre and post meals as discussed    Problem Solving Eating Pattern;Social situations;Addressing barriers to behavior change    Reducing Risk do foot checks daily      Post-Education Assessment   Patient understands the diabetes disease and treatment process. Demonstrates understanding / competency    Patient understands incorporating nutritional management into lifestyle. Demonstrates understanding / competency    Patient undertands incorporating physical activity into lifestyle. Demonstrates understanding / competency    Patient understands using medications safely. Demonstrates understanding / competency    Patient understands monitoring blood glucose, interpreting and using results Demonstrates understanding / competency    Patient understands prevention, detection, and  treatment of acute complications. Demonstrates understanding / competency    Patient understands prevention, detection, and treatment of chronic complications. Demonstrates understanding / competency    Patient understands how to develop strategies to address psychosocial issues. Demonstrates understanding / competency    Patient understands how to develop strategies to promote health/change behavior. Demonstrates understanding / competency      Outcomes   Expected Outcomes Demonstrated interest in learning but significant barriers to change    Future DMSE 4-6 wks    Program Status Completed             Individualized Plan for Diabetes Self-Management Training:   Learning Objective:  Patient will have a greater understanding of diabetes self-management. Patient education plan is to attend individual and/or group sessions per assessed needs and concerns.    Expected Outcomes:  Demonstrated interest in learning but significant barriers to change  Education material provided: ADA - How to Thrive: A Guide for Your Journey with Diabetes, Food label  handouts, Meal plan card, My Plate, and Carbohydrate counting sheet  If problems or questions, patient to contact team via:  Phone and Email  Future DSME appointment: 4-6 wks   -

## 2021-11-11 ENCOUNTER — Other Ambulatory Visit (HOSPITAL_COMMUNITY): Payer: Self-pay

## 2021-11-15 ENCOUNTER — Other Ambulatory Visit (HOSPITAL_COMMUNITY): Payer: Self-pay

## 2021-11-15 ENCOUNTER — Other Ambulatory Visit: Payer: Self-pay | Admitting: Pharmacist

## 2021-11-15 DIAGNOSIS — A6000 Herpesviral infection of urogenital system, unspecified: Secondary | ICD-10-CM

## 2021-11-15 MED ORDER — VALACYCLOVIR HCL 1 G PO TABS
1000.0000 mg | ORAL_TABLET | Freq: Every day | ORAL | 0 refills | Status: DC
  Filled 2021-11-15: qty 30, 30d supply, fill #0

## 2021-11-26 ENCOUNTER — Other Ambulatory Visit: Payer: Self-pay | Admitting: Obstetrics and Gynecology

## 2021-11-26 ENCOUNTER — Other Ambulatory Visit: Payer: Self-pay

## 2021-11-26 DIAGNOSIS — Z363 Encounter for antenatal screening for malformations: Secondary | ICD-10-CM

## 2021-12-03 ENCOUNTER — Other Ambulatory Visit (HOSPITAL_COMMUNITY): Payer: Self-pay

## 2021-12-06 ENCOUNTER — Other Ambulatory Visit (HOSPITAL_COMMUNITY): Payer: Self-pay

## 2021-12-14 ENCOUNTER — Ambulatory Visit: Payer: 59

## 2021-12-15 ENCOUNTER — Other Ambulatory Visit: Payer: Self-pay | Admitting: Infectious Disease

## 2021-12-15 ENCOUNTER — Other Ambulatory Visit (HOSPITAL_COMMUNITY): Payer: Self-pay

## 2021-12-15 ENCOUNTER — Other Ambulatory Visit: Payer: Self-pay | Admitting: Pharmacist

## 2021-12-15 ENCOUNTER — Other Ambulatory Visit: Payer: Self-pay | Admitting: Family

## 2021-12-15 DIAGNOSIS — B20 Human immunodeficiency virus [HIV] disease: Secondary | ICD-10-CM

## 2021-12-15 DIAGNOSIS — A6000 Herpesviral infection of urogenital system, unspecified: Secondary | ICD-10-CM

## 2021-12-15 MED ORDER — DESCOVY 200-25 MG PO TABS
1.0000 | ORAL_TABLET | Freq: Every day | ORAL | 0 refills | Status: DC
  Filled 2021-12-15: qty 30, 30d supply, fill #0

## 2021-12-15 MED ORDER — TIVICAY 50 MG PO TABS
50.0000 mg | ORAL_TABLET | Freq: Every day | ORAL | 0 refills | Status: DC
  Filled 2021-12-15: qty 30, 30d supply, fill #0

## 2021-12-15 MED ORDER — VALACYCLOVIR HCL 1 G PO TABS
1000.0000 mg | ORAL_TABLET | Freq: Every day | ORAL | 0 refills | Status: DC
  Filled 2021-12-15: qty 30, 30d supply, fill #0

## 2021-12-16 ENCOUNTER — Other Ambulatory Visit (HOSPITAL_COMMUNITY): Payer: Self-pay

## 2021-12-27 ENCOUNTER — Other Ambulatory Visit (HOSPITAL_COMMUNITY)
Admission: RE | Admit: 2021-12-27 | Discharge: 2021-12-27 | Disposition: A | Discharge status: Home / Self Care | Payer: 59 | Source: Ambulatory Visit | Attending: Infectious Disease | Admitting: Infectious Disease

## 2021-12-27 ENCOUNTER — Ambulatory Visit (INDEPENDENT_AMBULATORY_CARE_PROVIDER_SITE_OTHER): Payer: 59 | Admitting: Infectious Disease

## 2021-12-27 ENCOUNTER — Other Ambulatory Visit (HOSPITAL_COMMUNITY): Payer: Self-pay

## 2021-12-27 ENCOUNTER — Encounter: Payer: Self-pay | Admitting: Infectious Disease

## 2021-12-27 ENCOUNTER — Other Ambulatory Visit: Payer: Self-pay

## 2021-12-27 ENCOUNTER — Encounter: Payer: 59 | Admitting: Skilled Nursing Facility1

## 2021-12-27 VITALS — BP 128/82 | HR 95 | Temp 98.2°F | Ht 63.0 in | Wt 231.0 lb

## 2021-12-27 DIAGNOSIS — O09891 Supervision of other high risk pregnancies, first trimester: Secondary | ICD-10-CM | POA: Insufficient documentation

## 2021-12-27 DIAGNOSIS — Z8659 Personal history of other mental and behavioral disorders: Secondary | ICD-10-CM | POA: Diagnosis not present

## 2021-12-27 DIAGNOSIS — B20 Human immunodeficiency virus [HIV] disease: Secondary | ICD-10-CM | POA: Diagnosis not present

## 2021-12-27 DIAGNOSIS — E1169 Type 2 diabetes mellitus with other specified complication: Secondary | ICD-10-CM | POA: Diagnosis not present

## 2021-12-27 DIAGNOSIS — R87610 Atypical squamous cells of undetermined significance on cytologic smear of cervix (ASC-US): Secondary | ICD-10-CM

## 2021-12-27 DIAGNOSIS — R87810 Cervical high risk human papillomavirus (HPV) DNA test positive: Secondary | ICD-10-CM

## 2021-12-27 DIAGNOSIS — Z7185 Encounter for immunization safety counseling: Secondary | ICD-10-CM

## 2021-12-27 DIAGNOSIS — Z23 Encounter for immunization: Secondary | ICD-10-CM | POA: Diagnosis not present

## 2021-12-27 DIAGNOSIS — Z7984 Long term (current) use of oral hypoglycemic drugs: Secondary | ICD-10-CM

## 2021-12-27 MED ORDER — TIVICAY 50 MG PO TABS
50.0000 mg | ORAL_TABLET | Freq: Every day | ORAL | 8 refills | Status: DC
  Filled 2021-12-27 – 2022-01-21 (×2): qty 30, 30d supply, fill #0
  Filled 2022-02-22: qty 30, 30d supply, fill #1
  Filled 2022-03-16: qty 30, 30d supply, fill #2
  Filled 2022-04-18: qty 30, 30d supply, fill #3
  Filled 2022-05-12: qty 30, 30d supply, fill #4
  Filled 2022-06-13: qty 30, 30d supply, fill #5
  Filled 2022-07-13: qty 30, 30d supply, fill #6
  Filled 2022-08-15 – 2022-08-17 (×2): qty 30, 30d supply, fill #7
  Filled 2022-09-09: qty 30, 30d supply, fill #8

## 2021-12-27 MED ORDER — DESCOVY 200-25 MG PO TABS
1.0000 | ORAL_TABLET | Freq: Every day | ORAL | 8 refills | Status: DC
  Filled 2021-12-27 – 2022-01-21 (×2): qty 30, 30d supply, fill #0
  Filled 2022-02-22: qty 30, 30d supply, fill #1
  Filled 2022-03-16: qty 30, 30d supply, fill #2
  Filled 2022-04-18: qty 30, 30d supply, fill #3
  Filled 2022-05-12: qty 30, 30d supply, fill #4
  Filled 2022-06-13: qty 30, 30d supply, fill #5
  Filled 2022-07-13: qty 30, 30d supply, fill #6
  Filled 2022-08-15 – 2022-08-17 (×2): qty 30, 30d supply, fill #7
  Filled 2022-09-09: qty 30, 30d supply, fill #8

## 2021-12-27 NOTE — Progress Notes (Signed)
Subjective:   Chief complaint follow-up for HIV disease and patient who is newly pregnant   Patient ID: Kelly Hunter, female    DOB: [birth year], 31 y.o.   MRN: [id number]  HPI  Kelly Hunter is a 31 year old black woman living with HIV that has been perfectly controlled most recently on Biktarvy history also of diabetes mellitus CIN exercise-induced asthma who has become pregnant and is roughly in the fifth week of her pregnancy.  She was changed over to [facility] and [place] when she saw Alfonse Spruce with [facility].  She is tolerating this medication without problems.    Past Medical History:  Diagnosis Date   Acne    Allergy    Allegra   Asthma    mild; onset in childhood.  No hospitalizations.   ASTHMA, EXERCISE INDUCED    Cervical high risk HPV (human papillomavirus) test positive 07/2019   cervical cancer screening due in December, 2021   Chlamydia 03/03/2012   CIN I (cervical intraepithelial neoplasia I) 08/31/2013, 05/01/17   colposcopy by Toney Rakes; s/p CO2 laser treatment. repeat colposcopy by Dr. Quincy Simmonds.   Eczema    Elevated blood sugar 12/25/2017   Genital warts 08/31/2013   [place] treatment; Toney Rakes.   Grieving 12/25/2017   Herpes    Hidradenitis    Hidradenitis axillaris 05/21/2014   HIV (human immunodeficiency virus infection) ([place])    HIV (human immunodeficiency virus) risk factors complicating pregnancy 61/44/3154   HSV-2 seropositive    Migraine headache without aura    Mixed anxiety depressive disorder 06/16/2020   Moderate episode of recurrent major depressive disorder ([place]) 10/24/2018   Substance abuse ([place])    Type 2 diabetes mellitus without complication, without long-term current use of insulin ([place]) 11/17/2014   Vaccine counseling 01/22/2020   Vaginal Pap smear, abnormal    VIN I (vulvar intraepithelial neoplasia I) 08/31/2013   s/p CO2 treatment; Fernandez/gyn.    Past Surgical History:  Procedure Laterality Date   BREAST SURGERY  02/01/2008    Reduction   CESAREAN SECTION N/A 06/18/2020   Procedure: CESAREAN SECTION;  Surgeon: Sanjuana Kava, MD;  Location: [facility];  Service: Obstetrics;  Laterality: N/A;   CO2 LASER APPLICATION N/A 0/09/6759   Procedure: C02 laser of vagina and cervix;  Surgeon: Terrance Mass, MD;  Location: [place] [facility];  Service: Gynecology;  Laterality: N/A;   INCISION AND DRAINAGE ABSCESS Left 03/09/2017   Procedure: INCISION AND DRAINAGE ABSCESS LEFT BREAST ABSCESS;  Surgeon: Erroll Luna, MD;  Location: [facility];  Service: General;  Laterality: Left;    Family History  Problem Relation Age of Onset   Diabetes Mother    Hypertension Mother    Hyperlipidemia Mother    Mental illness Mother    Pulmonary embolism Mother    Cancer Maternal Grandfather        ?   Cancer Paternal Aunt        Cervical or Ovarian per patient      Social History   Socioeconomic History   Marital status: Single    Spouse name: n/a   Number of children: 0   Years of education: Not on file   Highest education level: Not on file  Occupational History   Occupation: Freight forwarder    Comment: [employer]  Tobacco Use   Smoking status: Never   Smokeless tobacco: Never  Vaping Use   Vaping Use: Never used  Substance and Sexual Activity   Alcohol use: Not Currently    Comment: occasionally  Drug use: No    Types: Marijuana   Sexual activity: Yes    Partners: Male    Birth control/protection: Condom    Comment: declined condoms  Other Topics Concern   Not on file  Social History Narrative   Marital status: single;  Not dating in 2016.      Children: none; never pregnant.      Lives: with mom; dad in [place].     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      Education: previously in school; took medical leave in 02/2012; [employer] in [place].      Tobacco: vapor cigarette two months. Pt does not smoke.       Alcohol:  Socially; weekends; no DUIs.      Drugs: marijuana daily.  Started at age  36.        Sexual activity: sexually active; 25 total partners; males; Chlamydia in 03/2012.  Genital warts 08/2013.  HIV diagnosis 12/2013.      Seatbelt: 100%      Guns:  none   Social Determinants of Radio broadcast assistant Strain: Not on file  Food Insecurity: No Food Insecurity (01/15/2020)   Hunger Vital Sign    Worried About Running Out of Food in the Last Year: Never true    Ran Out of Food in the Last Year: Never true  Transportation Needs: Not on file  Physical Activity: Not on file  Stress: Not on file  Social Connections: Not on file    Allergies  Allergen Reactions   Cashew Nut Oil     Pt states she is allergic to cashews and to all nuts   Other Nausea And Vomiting and Swelling    SWELLING REACTION UNSPECIFIED  Tree Nuts (Almonds)   Peanut Oil Nausea And Vomiting and Swelling    SWELLING REACTION UNSPECIFIED    Peanut-Containing Drug Products Nausea And Vomiting and Swelling    SWELLING REACTION UNSPECIFIED    Pyridium [Phenazopyridine Hcl] Nausea And Vomiting   Bactrim [Sulfamethoxazole-Trimethoprim] Rash     Current Outpatient Medications:    acetaminophen (TYLENOL) 500 MG tablet, Take 2 tablets (1,000 mg total) by mouth every 6 (six) hours. (Patient not taking: Reported on 04/02/2021), Disp: 30 tablet, Rfl: 0   bictegravir-emtricitabine-tenofovir AF (BIKTARVY) 50-200-25 MG TABS tablet, Take 1 tablet by mouth daily., Disp: 30 tablet, Rfl: 11   dolutegravir (TIVICAY) 50 MG tablet, Take 1 tablet (50 mg total) by mouth daily., Disp: 30 tablet, Rfl: 0   DULoxetine (CYMBALTA) 30 MG capsule, Take 1 capsule by mouth daily., Disp: 30 capsule, Rfl: 11   emtricitabine-tenofovir AF (DESCOVY) 200-25 MG tablet, Take 1 tablet by mouth daily., Disp: 30 tablet, Rfl: 0   glyBURIDE (DIABETA) 5 MG tablet, Take 1 tablet (5 mg total) by mouth at bedtime. (Patient not taking: Reported on 04/02/2021), Disp: 30 tablet, Rfl: 1   metFORMIN (GLUCOPHAGE) 500 MG tablet, TAKE 1 TABLET(500 MG)  BY MOUTH TWICE DAILY WITH A MEAL (Patient not taking: Reported on 04/02/2021), Disp: 60 tablet, Rfl: 1   NIFEdipine (ADALAT CC) 60 MG 24 hr tablet, Take 1 tablet (60 mg total) by mouth daily. (Patient not taking: Reported on 04/02/2021), Disp: 30 tablet, Rfl: 1   promethazine (PHENERGAN) 12.5 MG tablet, Take 1-2 tablets (12.5-25 mg total) by mouth every 6 (six) hours as needed for nausea or vomiting., Disp: 30 tablet, Rfl: 0   valACYclovir (VALTREX) 1000 MG tablet, Take  1 tablet by mouth once daily., Disp: 30 tablet, Rfl: 0   Review of Systems  Constitutional:  Negative for activity change, appetite change, chills, diaphoresis, fatigue, fever and unexpected weight change.  HENT:  Negative for congestion, rhinorrhea, sinus pressure, sneezing, sore throat and trouble swallowing.   Eyes:  Negative for photophobia and visual disturbance.  Respiratory:  Negative for cough, chest tightness, shortness of breath, wheezing and stridor.   Cardiovascular:  Negative for chest pain, palpitations and leg swelling.  Gastrointestinal:  Negative for abdominal distention, abdominal pain, anal bleeding, blood in stool, constipation, diarrhea, nausea and vomiting.  Genitourinary:  Negative for difficulty urinating, dysuria, flank pain and hematuria.  Musculoskeletal:  Negative for arthralgias, back pain, gait problem, joint swelling and myalgias.  Skin:  Negative for color change, pallor, rash and wound.  Neurological:  Negative for dizziness, tremors, weakness and light-headedness.  Hematological:  Negative for adenopathy. Does not bruise/bleed easily.  Psychiatric/Behavioral:  Negative for agitation, behavioral problems, confusion, decreased concentration, dysphoric mood and sleep disturbance.        Objective:   Physical Exam Constitutional:      General: She is not in acute distress.    Appearance: Normal appearance. She is well-developed. She is not ill-appearing or diaphoretic.  HENT:     Head:  Normocephalic and atraumatic.     Right Ear: Hearing and external ear normal.     Left Ear: Hearing and external ear normal.     Nose: No nasal deformity or rhinorrhea.  Eyes:     General: No scleral icterus.    Conjunctiva/sclera: Conjunctivae normal.     Right eye: Right conjunctiva is not injected.     Left eye: Left conjunctiva is not injected.     Pupils: Pupils are equal, round, and reactive to light.  Neck:     Vascular: No JVD.  Cardiovascular:     Rate and Rhythm: Normal rate and regular rhythm.     Heart sounds: S1 normal and S2 normal.  Pulmonary:     Effort: Pulmonary effort is normal. No respiratory distress.     Breath sounds: No wheezing.  Abdominal:     General: Bowel sounds are normal. There is no distension.     Palpations: Abdomen is soft.     Tenderness: There is no abdominal tenderness.  Musculoskeletal:        General: Normal range of motion.     Right shoulder: Normal.     Left shoulder: Normal.     Cervical back: Normal range of motion and neck supple.     Right hip: Normal.     Left hip: Normal.     Right knee: Normal.     Left knee: Normal.  Lymphadenopathy:     Head:     Right side of head: No submandibular, preauricular or posterior auricular adenopathy.     Left side of head: No submandibular, preauricular or posterior auricular adenopathy.     Cervical: No cervical adenopathy.     Right cervical: No superficial or deep cervical adenopathy.    Left cervical: No superficial or deep cervical adenopathy.  Skin:    General: Skin is warm and dry.     Coloration: Skin is not pale.     Findings: No abrasion, bruising, ecchymosis, erythema, lesion or rash.     Nails: There is no clubbing.  Neurological:     Mental Status: She is alert and oriented to person, place, and time.  Sensory: No sensory deficit.     Coordination: Coordination normal.     Gait: Gait normal.  Psychiatric:        Attention and Perception: She is attentive.        Mood  and Affect: Mood normal.        Speech: Speech normal.        Behavior: Behavior normal. Behavior is cooperative.        Thought Content: Thought content normal.        Judgment: Judgment normal.           Assessment & Plan:   HIV disease:  I will add order HIV viral load CD4 count CBC with differential CMP, RPR GC and chlamydia and I will continue  [name] T Trotta's Tivicay and Descocvy prescriptions  Diabetes mellitus: She is not on medications currently.  I have cautioned her that this will be one of the most important things to take care of during her pregnancy.  ASCUS: Following with OB/GYN   Vaccine counseling recommended flu and COVID-19 vaccination she excepted the former but not the latter I did counsel her on the increased risk to both herself and her baby that COVID-19 infection poses also recommended RSV vaccination towards the end of her final trimester.

## 2021-12-28 LAB — T-HELPER CELLS (CD4) COUNT (NOT AT ARMC)
CD4 % Helper T Cell: 53 % (ref 33–65)
CD4 T Cell Abs: 1009 /uL (ref 400–1790)

## 2021-12-29 ENCOUNTER — Other Ambulatory Visit (HOSPITAL_COMMUNITY): Payer: Self-pay

## 2021-12-29 LAB — CBC WITH DIFFERENTIAL/PLATELET
Absolute Monocytes: 598 cells/uL (ref 200–950)
Basophils Absolute: 28 cells/uL (ref 0–200)
Basophils Relative: 0.3 %
Eosinophils Absolute: 120 cells/uL (ref 15–500)
Eosinophils Relative: 1.3 %
HCT: 33 % — ABNORMAL LOW (ref 35.0–45.0)
Hemoglobin: 11.5 g/dL — ABNORMAL LOW (ref 11.7–15.5)
Lymphs Abs: 1886 cells/uL (ref 850–3900)
MCH: 30.3 pg (ref 27.0–33.0)
MCHC: 34.8 g/dL (ref 32.0–36.0)
MCV: 86.8 fL (ref 80.0–100.0)
MPV: 10.1 fL (ref 7.5–12.5)
Monocytes Relative: 6.5 %
Neutro Abs: 6569 cells/uL (ref 1500–7800)
Neutrophils Relative %: 71.4 %
Platelets: 350 10*3/uL (ref 140–400)
RBC: 3.8 10*6/uL (ref 3.80–5.10)
RDW: 14.8 % (ref 11.0–15.0)
Total Lymphocyte: 20.5 %
WBC: 9.2 10*3/uL (ref 3.8–10.8)

## 2021-12-29 LAB — COMPLETE METABOLIC PANEL WITH GFR
AG Ratio: 1.5 (calc) (ref 1.0–2.5)
ALT: 7 U/L (ref 6–29)
AST: 8 U/L — ABNORMAL LOW (ref 10–30)
Albumin: 3.8 g/dL (ref 3.6–5.1)
Alkaline phosphatase (APISO): 38 U/L (ref 31–125)
BUN: 8 mg/dL (ref 7–25)
CO2: 25 mmol/L (ref 20–32)
Calcium: 9.1 mg/dL (ref 8.6–10.2)
Chloride: 104 mmol/L (ref 98–110)
Creat: 0.66 mg/dL (ref 0.50–0.96)
Globulin: 2.6 g/dL (calc) (ref 1.9–3.7)
Glucose, Bld: 146 mg/dL — ABNORMAL HIGH (ref 65–99)
Potassium: 3.8 mmol/L (ref 3.5–5.3)
Sodium: 136 mmol/L (ref 135–146)
Total Bilirubin: 0.3 mg/dL (ref 0.2–1.2)
Total Protein: 6.4 g/dL (ref 6.1–8.1)
eGFR: 122 mL/min/{1.73_m2} (ref >=60)

## 2021-12-29 LAB — URINE CYTOLOGY ANCILLARY ONLY
Chlamydia: NEGATIVE
Comment: NEGATIVE
Comment: NORMAL
Neisseria Gonorrhea: NEGATIVE

## 2021-12-29 LAB — LIPID PANEL
Cholesterol: 173 mg/dL (ref <200)
HDL: 61 mg/dL (ref >=50)
LDL Cholesterol (Calc): 86 mg/dL (calc)
Non-HDL Cholesterol (Calc): 112 mg/dL (calc) (ref <130)
Total CHOL/HDL Ratio: 2.8 (calc) (ref <5.0)
Triglycerides: 166 mg/dL — ABNORMAL HIGH (ref <150)

## 2021-12-29 LAB — RPR: RPR Ser Ql: NONREACTIVE

## 2021-12-29 LAB — HIV-1 RNA QUANT-NO REFLEX-BLD
HIV 1 RNA Quant: NOT DETECTED Copies/mL
HIV-1 RNA Quant, Log: NOT DETECTED Log cps/mL

## 2022-01-04 ENCOUNTER — Other Ambulatory Visit (HOSPITAL_COMMUNITY): Payer: Self-pay

## 2022-01-06 ENCOUNTER — Other Ambulatory Visit (HOSPITAL_COMMUNITY): Payer: Self-pay

## 2022-01-10 ENCOUNTER — Other Ambulatory Visit (HOSPITAL_COMMUNITY): Payer: Self-pay

## 2022-01-11 ENCOUNTER — Ambulatory Visit: Payer: 59 | Attending: Obstetrics and Gynecology

## 2022-01-11 ENCOUNTER — Ambulatory Visit: Payer: 59 | Admitting: *Deleted

## 2022-01-11 ENCOUNTER — Ambulatory Visit (HOSPITAL_BASED_OUTPATIENT_CLINIC_OR_DEPARTMENT_OTHER): Payer: 59 | Admitting: Maternal & Fetal Medicine

## 2022-01-11 ENCOUNTER — Other Ambulatory Visit: Payer: Self-pay | Admitting: *Deleted

## 2022-01-11 VITALS — BP 125/82 | HR 93

## 2022-01-11 DIAGNOSIS — Z363 Encounter for antenatal screening for malformations: Secondary | ICD-10-CM | POA: Insufficient documentation

## 2022-01-11 DIAGNOSIS — O09292 Supervision of pregnancy with other poor reproductive or obstetric history, second trimester: Secondary | ICD-10-CM

## 2022-01-11 DIAGNOSIS — O24112 Pre-existing diabetes mellitus, type 2, in pregnancy, second trimester: Secondary | ICD-10-CM

## 2022-01-11 DIAGNOSIS — Z3A23 23 weeks gestation of pregnancy: Secondary | ICD-10-CM

## 2022-01-11 DIAGNOSIS — Z362 Encounter for other antenatal screening follow-up: Secondary | ICD-10-CM

## 2022-01-11 DIAGNOSIS — O09892 Supervision of other high risk pregnancies, second trimester: Secondary | ICD-10-CM

## 2022-01-11 DIAGNOSIS — O99212 Obesity complicating pregnancy, second trimester: Secondary | ICD-10-CM

## 2022-01-11 NOTE — Progress Notes (Signed)
MFM Consult Note Patient Name: Kelly Hunter  Patient MRN:   [id number]  Referring provider: [facility]  Reason for Consult: HIV, type 2 DM, HSV, prior CD, hx of preeclampsia    HPI: Kelly Hunter is a 31 y.o. G3P0010 at 63w5dby early ultrasound here for consult  RE HIV: BTanzaniaT JRozaswas diagnosed with HIV in 2015 and has been adherent to the combined antiretroviral therapy (cART) prescribed (Tivicay + Descovy daily) by her infections disease specialist, Dr. VTommy Medal Her most recent HIV viral load is undetectable copies/mL with a CD4 count of 1009. I discussed the complications associated with HIV during pregnancy. Patients taking cART with suppressed viral load, vertical transmission to the fetus (congenital HIV infection) is very low, <1-2%. With a low viral load (<1000 copies/mL), cesarean does not further reduce vertical transmission rate; therefore in cases of suppressed viral load, vaginal delivery is preferred as long as not contraindications exit. In patients with HIV RNA >1000 copies/mL near delivery we recommended cesarean at 33gestational weeks to reduce vertical transmission in addition to pre-delivery AZT for these patients. Current recommendations regarding use of intrapartum/peripartum IV AZT include: - Recommended: HIV RNA >1,000 copies/mL (or unknown HIV RNA) near delivery or for women with poor adherence to ART regardless of viral load.  - Not required: for women receiving ART regimens who have HIV RNA ?50 copies/mL during late pregnancy and near delivery and no concerns regarding adherence to the ART regimen.  - Considered: for women with HIV RNA between 50 and 999 copies/mL. There are inadequate data to determine whether administration of IV zidovudine to women with HIV RNA levels between 50 and 999 copies/mL provides any additional protection against perinatal transmission. However, some experts would administer IV zidovudine to women with RNA levels in this range, as the  transmission risk is slightly higher when HIV RNA is in the range of 50 and 999 copies/mL compared to <50 copies/mL (CII).If intrapartum IV AZT is prescribed it should be dosed as a '2mg'$ /kg loading dose in the 1st hour followed by 1 mg/kg/hour continuous infusion until delivery. Three hours of IV AZT is recommended prior to cesarean. We discussed the various obstetric complications with HIV in pregnancy. Some studies indicate an increased risk for fetal growth restriction (or low birth weight) or fetal death among HIV + mothers. However, since these findings are inconsistent fetal testing is not universally recommended. We discussed the importance of serial fetal growth ultrasounds and need to institute antenatal fetal testing if fetal growth restriction develops. Due to the above fetal risks, I recommend considering delivery at 39 gestational weeks if an indication does not arise prior.  RE HSV: I discussed her history of HSV and she denies any outbreaks in the past but did test positive on serology. She is compliant with valtrex. I discussed the importance of taking her medication to minimzie transmission to the fetus and prevent outbreaks during pregnancy.  We also discussed that a current outbreak or prodromal symptoms are indications for cesarean delivery.  RE Cesarean delivery: BTanzaniahas a history of a cesarean delivery for what sounds like failure to progress in labor that was induced due to preeclampsia at term.  She is uncertain if she wants to do a trial of labor after cesarean delivery or repeat cesarean delivery.  She is a candidate currently for a trial of labor but she will continue to think about her options.  RE Type 2 DM: BTanzaniaalso has type 2 diabetes.  She was diagnosed with gestational diabetes in her last pregnancy but then developed type 2 diabetes afterwards.  She reports that she does not check her blood sugars because she does not tolerate fingersticks very well.  She is  interested in getting a continuous glucose monitor.  We discussed the clinical implications of poorly controlled diabetes including but not limited to stillbirth, low oxygen levels during pregnancy, newborn complications, need for early delivery, growth abnormalities and long-term health complications.  She is going to record her blood sugars this week and bring her glucose log to her provider.  She will also inquire about a continuous glucose monitor.  RE hx of preeclampsia: Due to her history of preeclampsia she should take a aspirin during this pregnancy at 81 mg to reduce the risk of preeclampsia.  Review of Systems: A review of systems was performed and was negative except per HPI   Past Obstetrical History:  - SAB x 1 - Term delivery via cesarean for failure to progress due to preeclampsia  Past Gynecologic History:  Not discussed  Past Medical History:  Past Medical History:  Diagnosis Date   Acne    Allergy    Allegra   Asthma    mild; onset in childhood.  No hospitalizations.   ASTHMA, EXERCISE INDUCED    Cervical high risk HPV (human papillomavirus) test positive 07/2019   cervical cancer screening due in December, 2021   Chlamydia 03/03/2012   CIN I (cervical intraepithelial neoplasia I) 08/31/2013, 05/01/17   colposcopy by Toney Rakes; s/p CO2 laser treatment. repeat colposcopy by Dr. Quincy Simmonds.   Eczema    Elevated blood sugar 12/25/2017   Encounter for induction of labor 06/17/2020   Genital warts 08/31/2013   [place]ara treatment; Toney Rakes.   Grieving 12/25/2017   Herpes    Hidradenitis    Hidradenitis axillaris 05/21/2014   HIV (human immunodeficiency virus infection) ([place])    HIV (human immunodeficiency virus) risk factors complicating pregnancy 16/11/9602   HSV-2 seropositive    Migraine headache without aura    Mixed anxiety depressive disorder 06/16/2020   Moderate episode of recurrent major depressive disorder ([place]) 10/24/2018   Substance abuse ([place])    Type 2  diabetes mellitus without complication, without long-term current use of insulin ([place]) 11/17/2014   Vaccine counseling 01/22/2020   Vaginal Pap smear, abnormal    VIN I (vulvar intraepithelial neoplasia I) 08/31/2013   s/p CO2 treatment; Fernandez/gyn.    Past Surgical History:    Past Surgical History:  Procedure Laterality Date   BREAST SURGERY  02/01/2008   Reduction   CESAREAN SECTION N/A 06/18/2020   Procedure: CESAREAN SECTION;  Surgeon: Sanjuana Kava, MD;  Location: [facility];  Service: Obstetrics;  Laterality: N/A;   CO2 LASER APPLICATION N/A 5/40/9811   Procedure: C02 laser of vagina and cervix;  Surgeon: Terrance Mass, MD;  Location: [facility];  Service: Gynecology;  Laterality: N/A;   INCISION AND DRAINAGE ABSCESS Left 03/09/2017   Procedure: INCISION AND DRAINAGE ABSCESS LEFT BREAST ABSCESS;  Surgeon: Erroll Luna, MD;  Location: [facility];  Service: General;  Laterality: Left;    Family History:   family history includes Cancer in her maternal grandfather and paternal aunt; Diabetes in her mother; Hyperlipidemia in her mother; Hypertension in her mother; Mental illness in her mother; Pulmonary embolism in her mother.   Social History:   Social History   Socioeconomic History   Marital status: Single    Spouse name: n/a   Number of  children: 0   Years of education: Not on file   Highest education level: Not on file  Occupational History   Occupation: Freight forwarder    Comment: [employer]  Tobacco Use   Smoking status: Never   Smokeless tobacco: Never  Vaping Use   Vaping Use: Never used  Substance and Sexual Activity   Alcohol use: Not Currently    Comment: occasionally   Drug use: No    Types: Marijuana    Comment: last use july 2023   Sexual activity: Yes    Partners: Male    Birth control/protection: Condom    Comment: declined condoms  Other Topics Concern   Not on file  Social History Narrative   Marital status: single;  Not dating in 2016.      Children: none; never  pregnant.      Lives: with mom; dad in [place].     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      Education: previously in school; took medical leave in 02/2012; [employer] in [place].      Tobacco: vapor cigarette two months. Pt does not smoke.       Alcohol:  Socially; weekends; no DUIs.      Drugs: marijuana daily.  Started at age 36.        Sexual activity: sexually active; 25 total partners; males; Chlamydia in 03/2012.  Genital warts 08/2013.  HIV diagnosis 12/2013.      Seatbelt: 100%      Guns:  none   Social Determinants of Radio broadcast assistant Strain: Not on file  Food Insecurity: No Food Insecurity (01/15/2020)   Hunger Vital Sign    Worried About Running Out of Food in the Last Year: Never true    Ran Out of Food in the Last Year: Never true  Transportation Needs: Not on file  Physical Activity: Not on file  Stress: Not on file  Social Connections: Not on file  Intimate Partner Violence: Not on file      Home Medications:   Current Outpatient Medications on File Prior to Visit  Medication Sig Dispense Refill   acetaminophen (TYLENOL) 500 MG tablet Take 2 tablets (1,000 mg total) by mouth every 6 (six) hours. (Patient not taking: Reported on 04/02/2021) 30 tablet 0   dolutegravir (TIVICAY) 50 MG tablet Take 1 tablet (50 mg total) by mouth daily. 30 tablet 8   DULoxetine (CYMBALTA) 30 MG capsule Take 1 capsule by mouth daily. (Patient not taking: Reported on 12/27/2021) 30 capsule 11   emtricitabine-tenofovir AF (DESCOVY) 200-25 MG tablet Take 1 tablet by mouth daily. 30 tablet 8   glyBURIDE (DIABETA) 5 MG tablet Take 1 tablet (5 mg total) by mouth at bedtime. (Patient not taking: Reported on 04/02/2021) 30 tablet 1   metFORMIN (GLUCOPHAGE) 500 MG tablet TAKE 1 TABLET(500 MG) BY MOUTH TWICE DAILY WITH A MEAL (Patient not taking: Reported on 04/02/2021) 60 tablet 1   NIFEdipine (ADALAT CC) 60 MG 24 hr tablet Take 1 tablet (60 mg total)  by mouth daily. (Patient not taking: Reported on 04/02/2021) 30 tablet 1   promethazine (PHENERGAN) 12.5 MG tablet Take 1-2 tablets (12.5-25 mg total) by mouth every 6 (six) hours as needed for nausea or vomiting. (Patient not taking: Reported on 12/27/2021) 30 tablet 0   valACYclovir (VALTREX) 1000 MG tablet Take 1 tablet by mouth once daily. 30 tablet 0   No current facility-administered medications on  file prior to visit.      Allergies:   Allergies  Allergen Reactions   Cashew Nut Oil     Pt states she is allergic to cashews and to all nuts   Other Nausea And Vomiting and Swelling    SWELLING REACTION UNSPECIFIED  Tree Nuts (Almonds)   Peanut Oil Nausea And Vomiting and Swelling    SWELLING REACTION UNSPECIFIED    Peanut-Containing Drug Products Nausea And Vomiting and Swelling    SWELLING REACTION UNSPECIFIED    Pyridium [Phenazopyridine Hcl] Nausea And Vomiting   Bactrim [Sulfamethoxazole-Trimethoprim] Rash     Physical Exam:   See intake sheet for vitals Sitting comfortably on the sonogram table Nonlabored breathing Normal rate and rhythm Abdomen is nontender  Sonographic findings Single intrauterine pregnancy. Observed fetal cardiac activity. Cephalic presentation. Fetal anatomy that was well seen appears normal without evidence of soft markers. Not all fetal structures were well seen due to a technically diffcult exam and the anatomic survey remains incomplete with limited views of the hands, feet, palate, and ACI.   Fetal biometry shows the estimated fetal weight at the 24 percentile.  Amniotic fluid volume: Within normal limits. Placenta: Posterior. Cervix: Normal appearance by transabdominal scan with a cervical length of 3.2 cm. Adnexa: No masses visualized  Assessment  Kelly Hunter is a 31 y.o. G3P0010 at 61w5dwith a pregnancy complicated by the following conditions:   1. HIV  2. Diabetes Mellitus type 2 3. HSV 4. History of preeclampsia 5. Obesity  (pre-gravid BMI 38) 6. Prior CD  Recommendations - Aneuploidy screening was offered at her OB providers office and was declined.  - Start 81 mg of Aspirin for preeclampsia prophylaxis - Baseline labs: CMP, CBC, urine protein creatinine ratio have been done and are normal except I do not see a urine protein level (24h or protein/creatinine ratio are acceptable) since becoming pregnant. If this has not been done recently it should be repeated.  - BTanzaniashould bring her glucose log to every visit - Referral to diabetic education to discuss diet, CGM and importance of glucose control. If her blood sugars are not controlled with dietary alterations insulin is the preferred first line medication.  - Detailed anatomic survey was completed today. - [facility] - Serial growth ultrasounds every 4 weeks starting at 24 to 28 weeks until delivery - Antenatal testing with either weekly biophysical profiles or twice weekly nonstress test with weekly amniotic fluid check starting at 32 weeks until delivery - Delivery around 37-[redacted] weeks gestation or sooner if indicated - Co-management of prenatal care with general OB/GYN and consultative care from MFM and ID. - Continue current cART medications under the guidance of her ID provider. - Delivery route and timing based on viral load. If the viral load is  >1000 copies/mL, a cesarean delivery is recommended at [redacted] weeks along with AZT 3 hours before delivery. If viral load is < 100 copies/mL, then intrapartum AZT and vaginal delivery are recommended as long as no other obstetric contraindication exit.  - Minimize fetal risk with intrapartum exposures: avoid early AROM and fetal scalp electrode if possible   Thank you for the opportunity to be involved with this patient's care. Please let uKoreaknow if we can be of any further assistance.   BValeda Malm MFM, [place]  01/11/2022  8:42 AM

## 2022-01-21 ENCOUNTER — Other Ambulatory Visit (HOSPITAL_COMMUNITY): Payer: Self-pay

## 2022-02-08 ENCOUNTER — Other Ambulatory Visit (HOSPITAL_COMMUNITY): Payer: Self-pay

## 2022-02-08 ENCOUNTER — Ambulatory Visit: Payer: 59 | Attending: Maternal & Fetal Medicine

## 2022-02-08 ENCOUNTER — Ambulatory Visit: Payer: 59 | Admitting: *Deleted

## 2022-02-08 VITALS — BP 115/64 | HR 107

## 2022-02-08 DIAGNOSIS — O98712 Human immunodeficiency virus [HIV] disease complicating pregnancy, second trimester: Secondary | ICD-10-CM | POA: Diagnosis not present

## 2022-02-08 DIAGNOSIS — O99212 Obesity complicating pregnancy, second trimester: Secondary | ICD-10-CM | POA: Diagnosis present

## 2022-02-08 DIAGNOSIS — B20 Human immunodeficiency virus [HIV] disease: Secondary | ICD-10-CM

## 2022-02-08 DIAGNOSIS — Z362 Encounter for other antenatal screening follow-up: Secondary | ICD-10-CM | POA: Diagnosis present

## 2022-02-08 DIAGNOSIS — E119 Type 2 diabetes mellitus without complications: Secondary | ICD-10-CM | POA: Diagnosis not present

## 2022-02-08 DIAGNOSIS — O24112 Pre-existing diabetes mellitus, type 2, in pregnancy, second trimester: Secondary | ICD-10-CM

## 2022-02-08 DIAGNOSIS — O98512 Other viral diseases complicating pregnancy, second trimester: Secondary | ICD-10-CM

## 2022-02-08 DIAGNOSIS — O09892 Supervision of other high risk pregnancies, second trimester: Secondary | ICD-10-CM | POA: Diagnosis not present

## 2022-02-08 DIAGNOSIS — O09292 Supervision of pregnancy with other poor reproductive or obstetric history, second trimester: Secondary | ICD-10-CM

## 2022-02-08 DIAGNOSIS — B009 Herpesviral infection, unspecified: Secondary | ICD-10-CM

## 2022-02-08 DIAGNOSIS — O34219 Maternal care for unspecified type scar from previous cesarean delivery: Secondary | ICD-10-CM

## 2022-02-08 DIAGNOSIS — E669 Obesity, unspecified: Secondary | ICD-10-CM

## 2022-02-08 DIAGNOSIS — Z3A27 27 weeks gestation of pregnancy: Secondary | ICD-10-CM

## 2022-02-10 ENCOUNTER — Other Ambulatory Visit (HOSPITAL_COMMUNITY): Payer: Self-pay

## 2022-02-11 ENCOUNTER — Other Ambulatory Visit (HOSPITAL_COMMUNITY): Payer: Self-pay

## 2022-02-15 ENCOUNTER — Other Ambulatory Visit (HOSPITAL_COMMUNITY): Payer: Self-pay

## 2022-02-17 DIAGNOSIS — F331 Major depressive disorder, recurrent, moderate: Secondary | ICD-10-CM | POA: Diagnosis not present

## 2022-02-17 LAB — OB RESULTS CONSOLE HIV ANTIBODY (ROUTINE TESTING): HIV: REACTIVE

## 2022-02-22 ENCOUNTER — Other Ambulatory Visit (HOSPITAL_COMMUNITY): Payer: Self-pay

## 2022-03-04 IMAGING — US US MFM UA CORD DOPPLER
1 series · 14 of 26 positions shown · non-contrast
Comparison: none

[Series 1: us mfm ua cord doppler · 26 acquisitions, 14 frames shown]
[im 1/26]
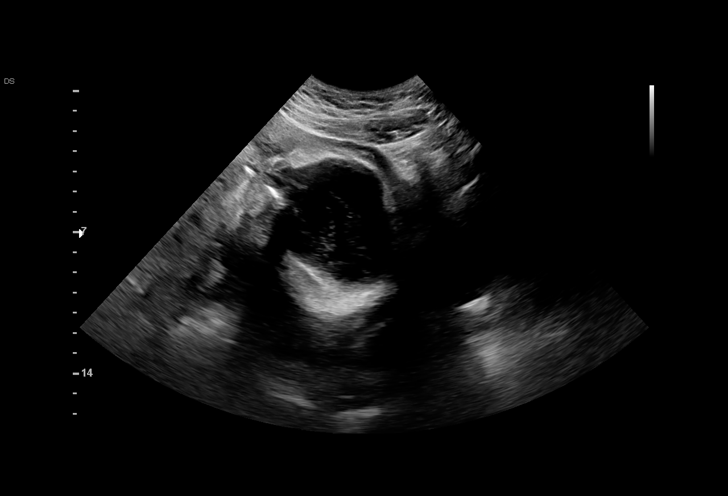
[im 3/26]
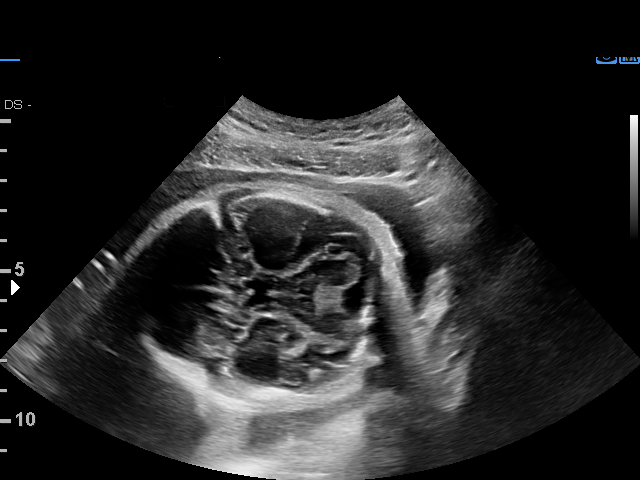
[im 5/26]
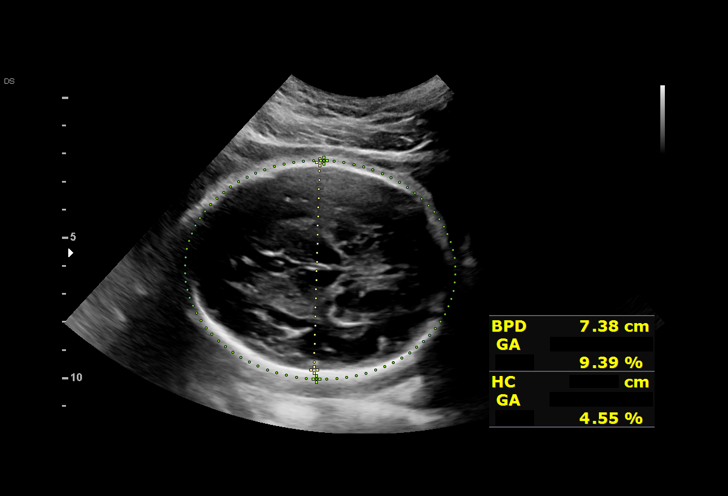
[im 7/26]
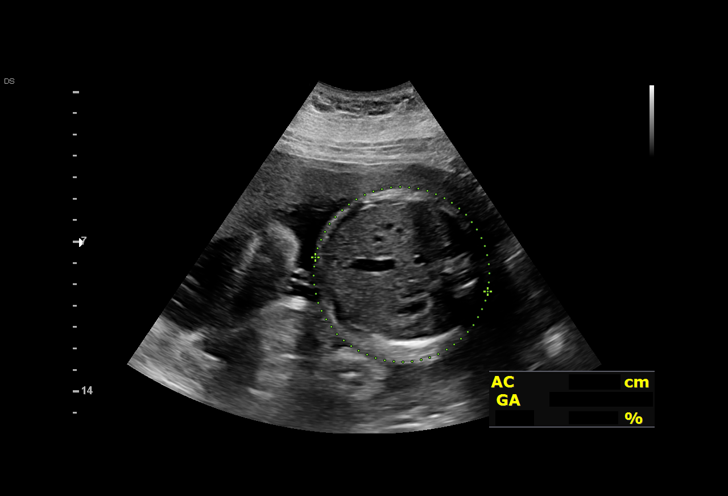
[im 9/26]
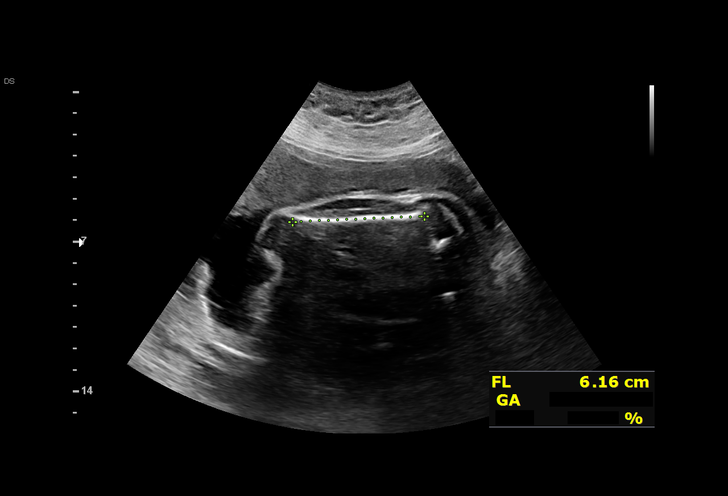
[im 11/26]
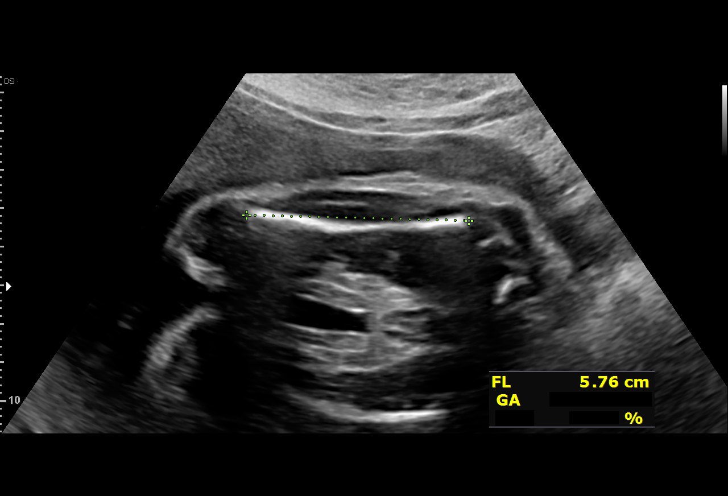
[im 13/26]
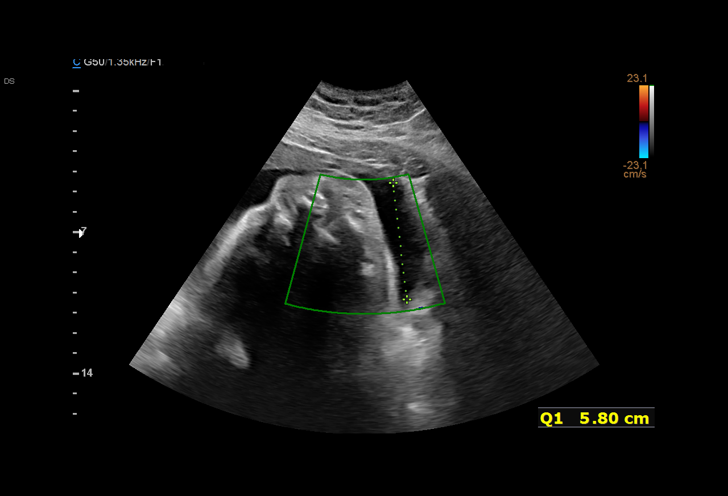
[im 14/26]
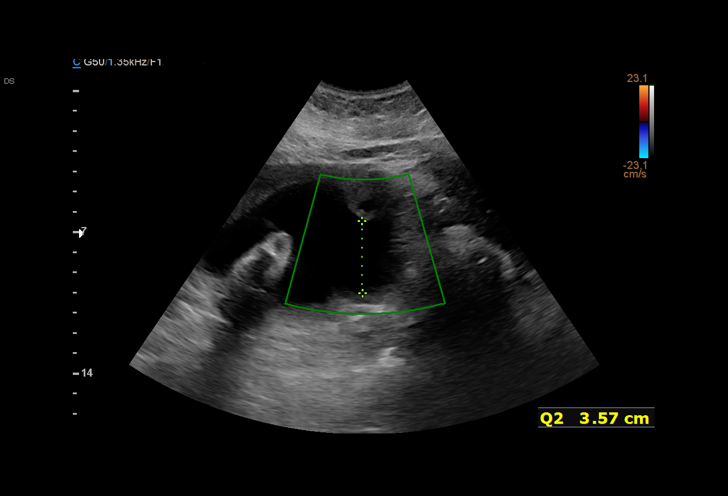
[im 16/26]
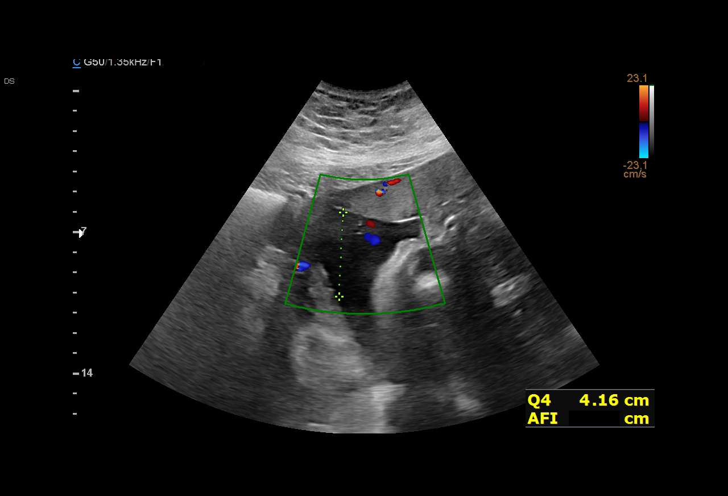
[im 18/26]
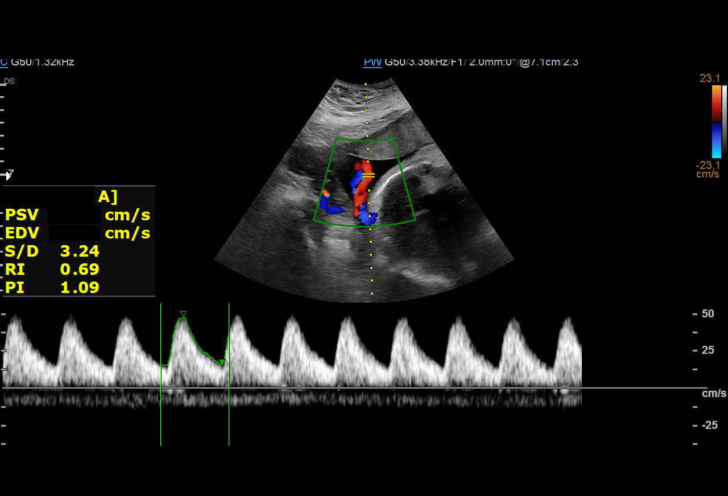
[im 20/26]
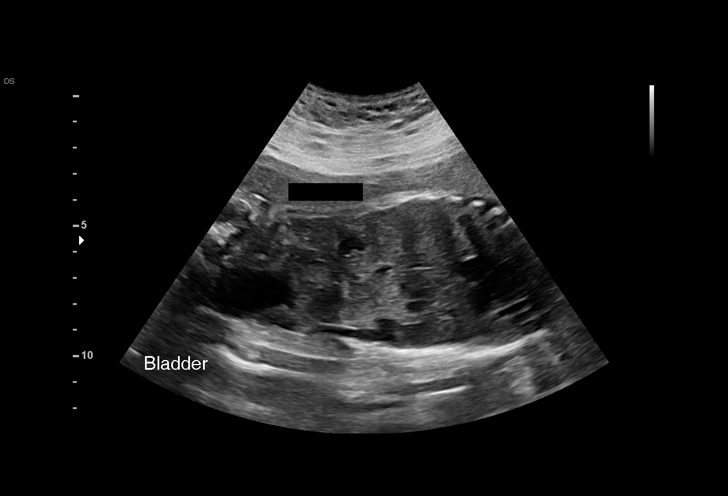
[im 22/26]
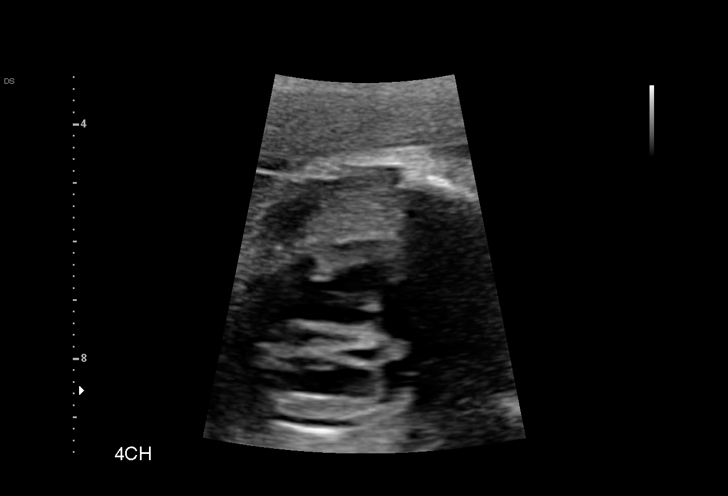
[im 24/26]
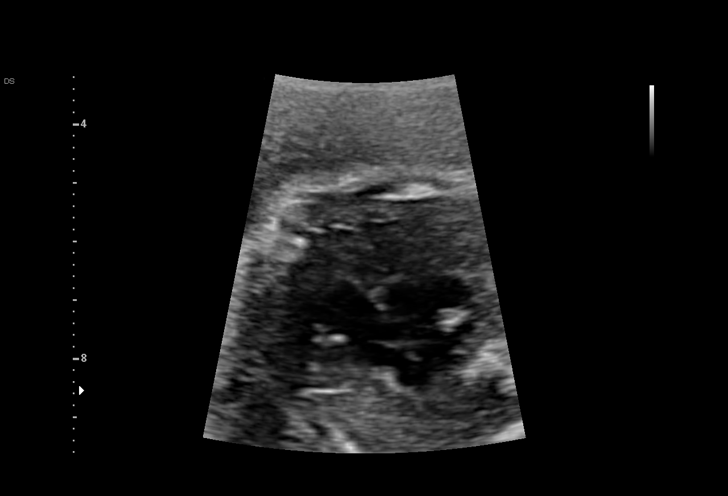
[im 26/26]
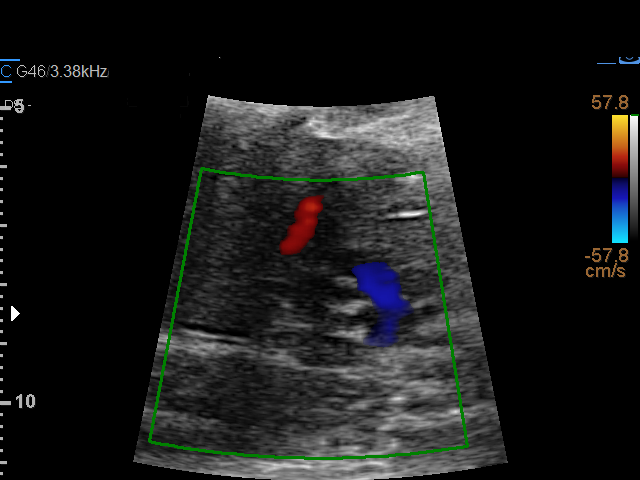

[14 of 26 positions shown; findings below may reference images not displayed]

OB

Indications

 Maternal care for known or suspected poor
 fetal growth, third trimester, not applicable or
 unspecified IUGR
 HIV affecting pregnancy, third trimester
 Gestational diabetes in pregnancy,
 controlled by oral hypoglycemic drugs
 Obesity complicating pregnancy, third
 trimester (BMI 41)
 Encounter for other antenatal screening
 follow-up
 30 weeks gestation of pregnancy
Fetal Evaluation

 Num Of Fetuses:         1
 Cardiac Activity:       Observed
 Presentation:           Cephalic
 Placenta:               Anterior
 P. Cord Insertion:      Previously Visualized

 Amniotic Fluid
 AFI FV:      Within normal limits

 AFI Sum(cm)     %Tile       Largest Pocket(cm)
 21.76           85
 RUQ(cm)       RLQ(cm)       LUQ(cm)        LLQ(cm)

Biometry

 BPD:      74.3  mm     G. Age:  29w 6d         12  %    CI:        73.76   %    70 - 86
                                                         FL/HC:      21.5   %    19.3 -
 HC:      274.8  mm     G. Age:  30w 0d        4.5  %    HC/AC:      1.05        0.96 -
 AC:      262.4  mm     G. Age:  30w 3d         32  %    FL/BPD:     79.7   %    71 - 87
 FL:       59.2  mm     G. Age:  30w 6d         36  %    FL/AC:      22.6   %    20 - 24

 Est. FW:    3320  gm      3 lb 8 oz     25  %
OB History

 Gravidity:    2
 Living:       0
Gestational Age

 LMP:           30w 6d        Date:  09/18/19                 EDD:   06/24/20
 U/S Today:     30w 2d                                        EDD:   06/28/20
 Best:          30w 6d     Det. By:  LMP  (09/18/19)          EDD:   06/24/20
Anatomy

 Cranium:               Appears normal         Heart:                  Appears normal
                                                                       (4CH, axis, and
                                                                       situs)
 Cavum:                 Appears normal         RVOT:                   Appears normal
 Ventricles:            Appears normal         LVOT:                   Appears normal
 Choroid Plexus:        Appears normal         Aortic Arch:            Appears normal
 Cerebellum:            Appears normal         Stomach:                Appears normal, left
                                                                       sided
 Posterior Fossa:       Appears normal         Kidneys:                Appear normal
 Face:                  Appears normal         Bladder:                Appears normal
                        (orbits and profile)
 Lips:                  Appears normal

 Other:  Other anatomy previously imaged and appeared normal.
Doppler - Fetal Vessels

 Umbilical Artery
  S/D     %tile      RI    %tile      PI    %tile
  3.09       68    0.68       74    1.06       76

Impression

 Growth restriction.  On ultrasound performed 3 weeks ago
 estimated fetal weight was at the 7th percentile.
 Patient has gestational diabetes and takes Metformin for
 control.

 On today's ultrasound, amniotic fluid is normal and good fetal
 activity seen.  The estimated fetal weight is at the 25th
 percentile.  Cephalic presentation.  Umbilical artery Doppler
 showed normal forward diastolic flow.  NST is reactive.
 I reassured the patient of normal fetal growth assessment.
Recommendations

 -Weekly BPP from next week till delivery.
 -We will discontinue NST and UA Doppler studies.
                 Tiger, Zeinab

## 2022-03-08 ENCOUNTER — Ambulatory Visit: Payer: 59 | Admitting: *Deleted

## 2022-03-08 ENCOUNTER — Encounter: Payer: Self-pay | Admitting: *Deleted

## 2022-03-08 ENCOUNTER — Ambulatory Visit: Payer: 59 | Attending: Maternal & Fetal Medicine

## 2022-03-08 ENCOUNTER — Other Ambulatory Visit: Payer: Self-pay | Admitting: Maternal & Fetal Medicine

## 2022-03-08 DIAGNOSIS — O09293 Supervision of pregnancy with other poor reproductive or obstetric history, third trimester: Secondary | ICD-10-CM

## 2022-03-08 DIAGNOSIS — O98713 Human immunodeficiency virus [HIV] disease complicating pregnancy, third trimester: Secondary | ICD-10-CM

## 2022-03-08 DIAGNOSIS — Z362 Encounter for other antenatal screening follow-up: Secondary | ICD-10-CM | POA: Diagnosis present

## 2022-03-08 DIAGNOSIS — O09892 Supervision of other high risk pregnancies, second trimester: Secondary | ICD-10-CM | POA: Insufficient documentation

## 2022-03-08 DIAGNOSIS — O09292 Supervision of pregnancy with other poor reproductive or obstetric history, second trimester: Secondary | ICD-10-CM

## 2022-03-08 DIAGNOSIS — Z3A31 31 weeks gestation of pregnancy: Secondary | ICD-10-CM

## 2022-03-08 DIAGNOSIS — O99212 Obesity complicating pregnancy, second trimester: Secondary | ICD-10-CM | POA: Insufficient documentation

## 2022-03-08 DIAGNOSIS — E119 Type 2 diabetes mellitus without complications: Secondary | ICD-10-CM

## 2022-03-08 DIAGNOSIS — B20 Human immunodeficiency virus [HIV] disease: Secondary | ICD-10-CM

## 2022-03-08 DIAGNOSIS — O24112 Pre-existing diabetes mellitus, type 2, in pregnancy, second trimester: Secondary | ICD-10-CM

## 2022-03-08 DIAGNOSIS — O99213 Obesity complicating pregnancy, third trimester: Secondary | ICD-10-CM

## 2022-03-08 DIAGNOSIS — O34219 Maternal care for unspecified type scar from previous cesarean delivery: Secondary | ICD-10-CM

## 2022-03-08 DIAGNOSIS — O24113 Pre-existing diabetes mellitus, type 2, in pregnancy, third trimester: Secondary | ICD-10-CM | POA: Diagnosis not present

## 2022-03-08 DIAGNOSIS — Z794 Long term (current) use of insulin: Secondary | ICD-10-CM

## 2022-03-08 DIAGNOSIS — E669 Obesity, unspecified: Secondary | ICD-10-CM

## 2022-03-11 IMAGING — US US MFM FETAL BPP W/O NON-STRESS
1 series · 15 of 28 positions shown · non-contrast
Comparison: none

[Series 1: us mfm fetal bpp w/o non-stress · 39 acquisitions, 15 frames shown]
[im 1/39]
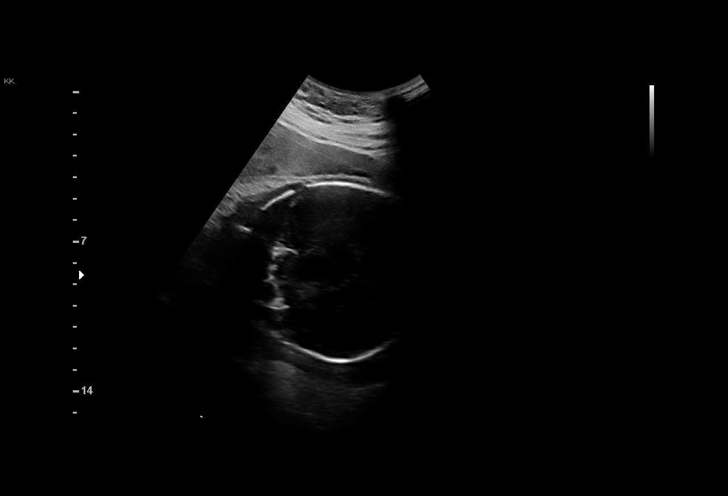
[im 3/39]
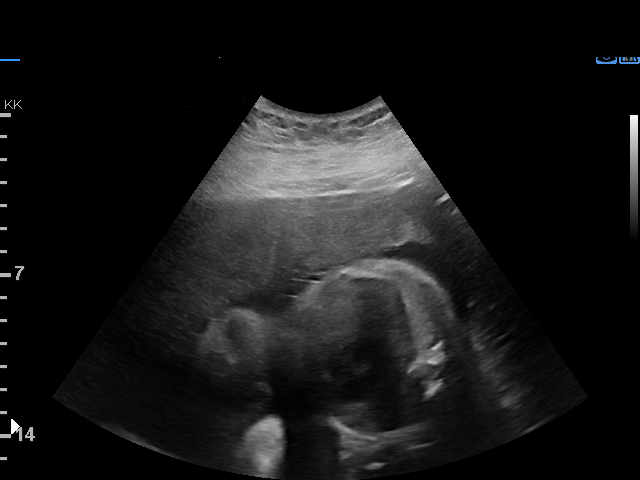
[im 6/39]
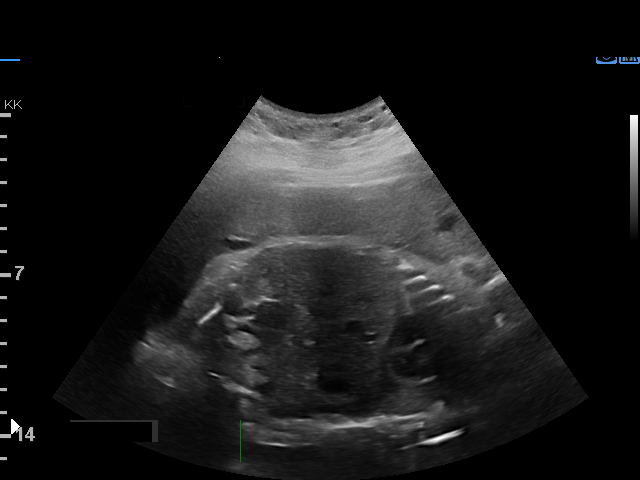
[im 9/39]
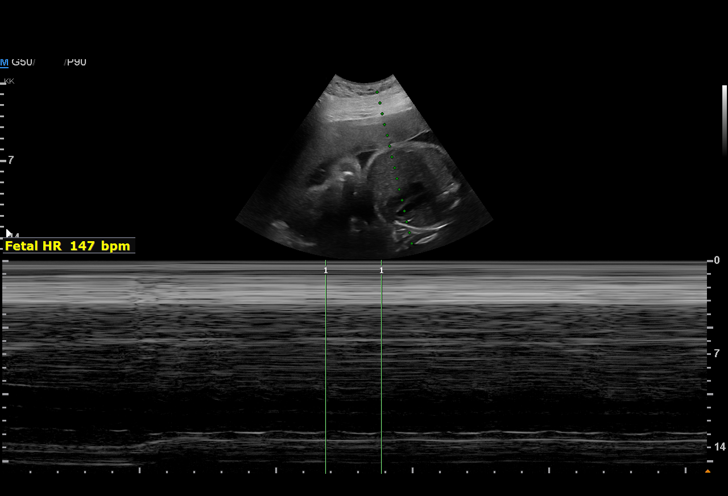
[im 12/39]
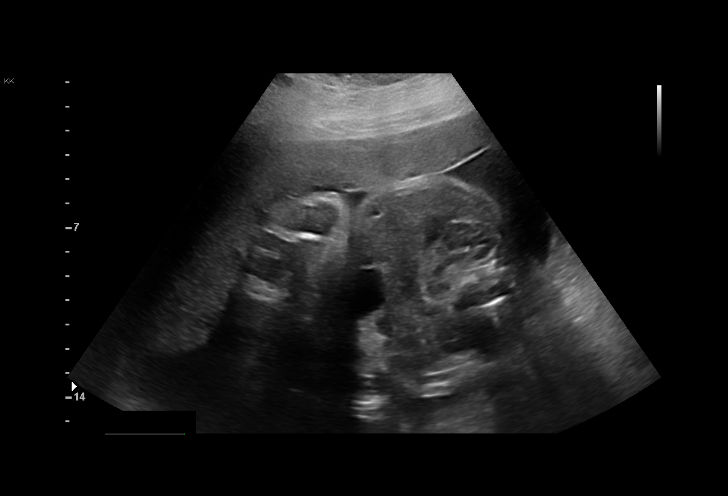
[im 15/39]
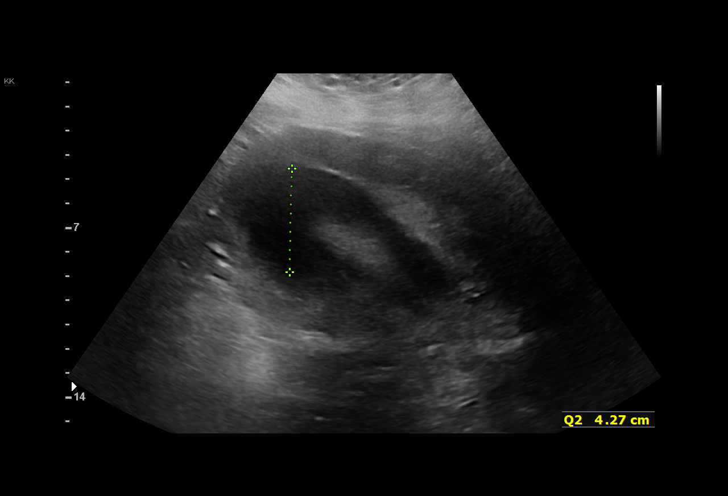
[im 17/39]
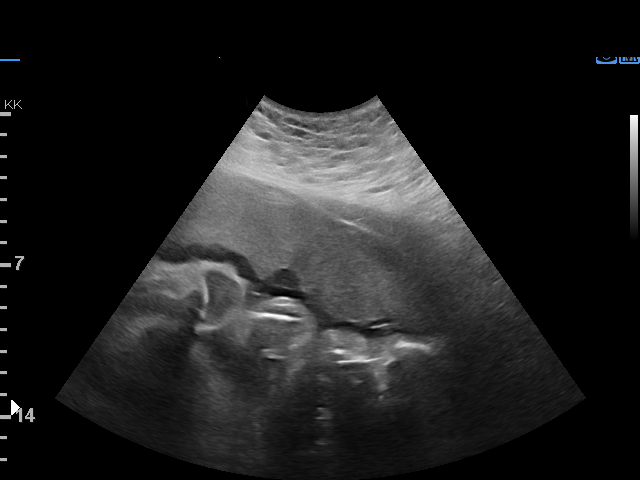
[im 20/39]
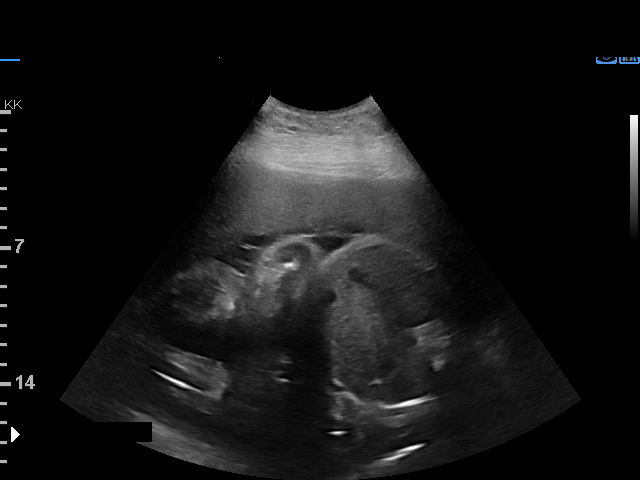
[im 22/39]
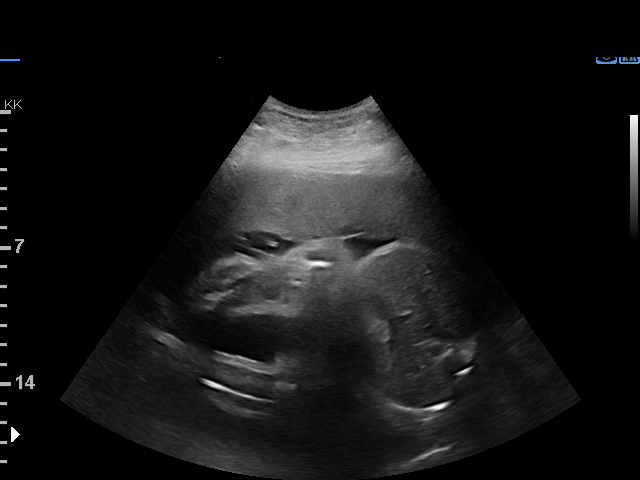
[im 24/39]
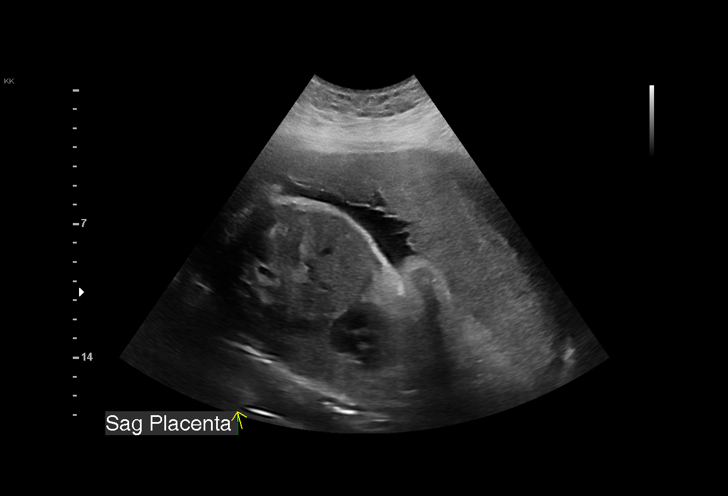
[im 27/39]
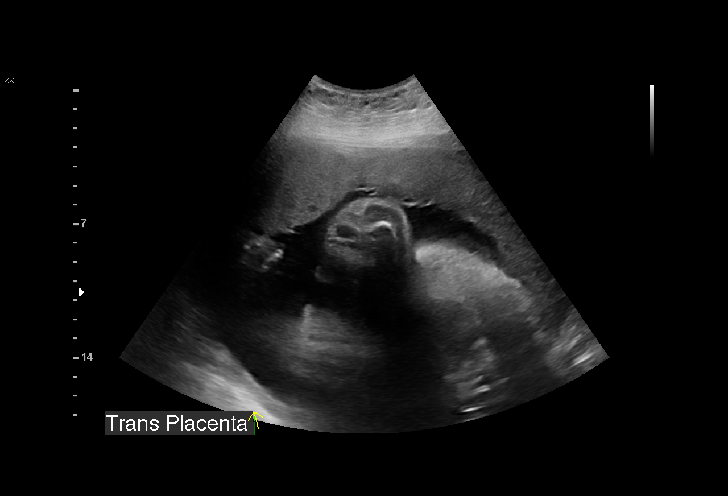
[im 30/39]
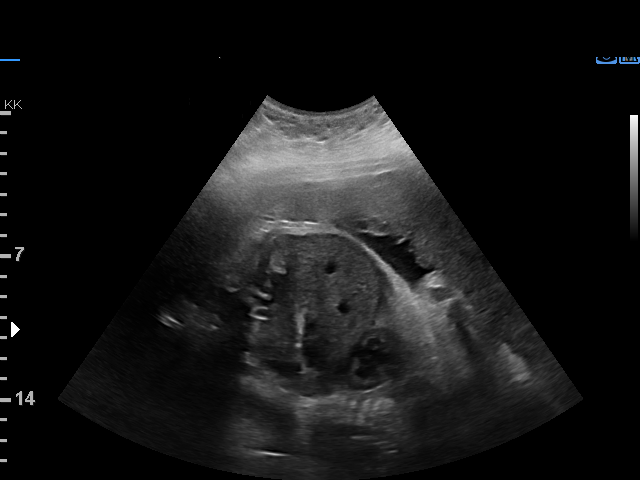
[im 33/39]
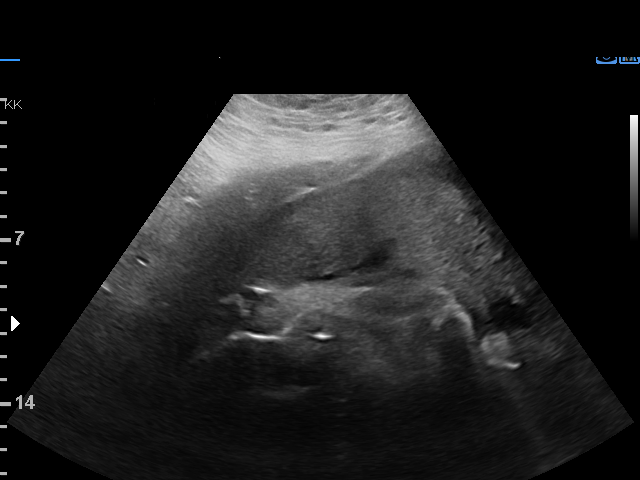
[im 36/39]
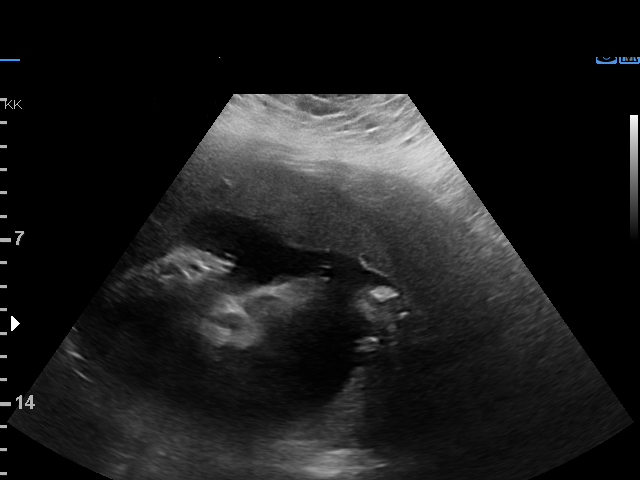
[im 39/39]
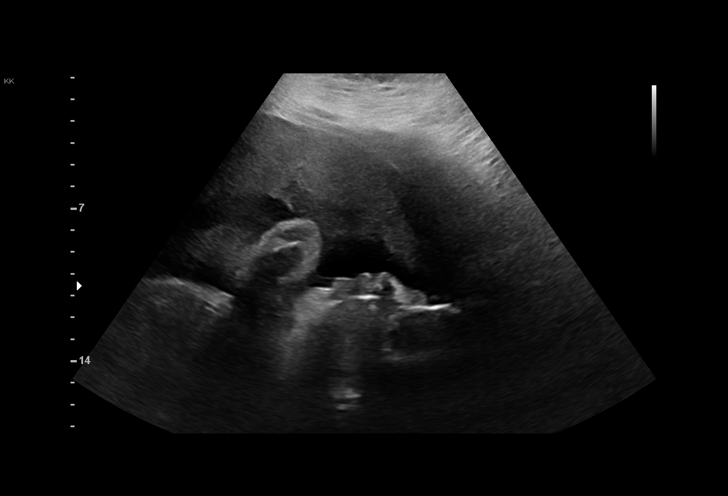

[15 of 28 positions shown; findings below may reference images not displayed]

OB

Indications

 Gestational diabetes in pregnancy,
 controlled by oral hypoglycemic drugs
 Maternal care for known or suspected poor
 fetal growth, third trimester, not applicable or
 unspecified IUGR
 31 weeks gestation of pregnancy
 HIV affecting pregnancy, third trimester
 Obesity complicating pregnancy, third
 trimester (BMI 41)
Fetal Evaluation

 Num Of Fetuses:         1
 Fetal Heart Rate(bpm):  147
 Cardiac Activity:       Observed
 Presentation:           Cephalic
 Placenta:               Anterior
 P. Cord Insertion:      Previously Visualized

 Amniotic Fluid
 AFI FV:      Within normal limits

 AFI Sum(cm)     %Tile       Largest Pocket(cm)
 16.4            59

 RUQ(cm)       RLQ(cm)       LUQ(cm)        LLQ(cm)

Biophysical Evaluation

 Amniotic F.V:   Pocket => 2 cm             F. Tone:        Observed
 F. Movement:    Observed                   Score:          [DATE]
 F. Breathing:   Observed
OB History

 Gravidity:    2
 Living:       0
Gestational Age

 LMP:           31w 6d        Date:  09/18/19                 EDD:   06/24/20
 Best:          31w 6d     Det. By:  LMP  (09/18/19)          EDD:   06/24/20
Anatomy

 Stomach:               Appears normal, left   Bladder:                Appears normal
                        sided
Doppler - Fetal Vessels

 Umbilical Artery
  S/D     %tile      RI    %tile      PI    %tile     PSV    ADFV    RDFV
                                                    (cm/s)
  2.91       63    0.66       70    1.[REDACTED]      No      No

Comments

 This patient was seen for a biophysical profile due to
 gestational diabetes currently treated with Metformin,
 maternal HIV disease, and IUGR that had been noted earlier
 in her current pregnancy.  She denies any problems since her
 last exam.
 A biophysical profile performed today was [DATE].
 There was normal amniotic fluid noted on today's ultrasound
 exam.
 Doppler studies of the umbilical arteries performed today
 continues to show normal forward flow.  There were no signs
 of absent or reversed end-diastolic flow.
 She will return in 1 week for another biophysical profile.

## 2022-03-15 ENCOUNTER — Ambulatory Visit: Payer: 59

## 2022-03-15 ENCOUNTER — Encounter (HOSPITAL_COMMUNITY): Payer: Self-pay | Admitting: Obstetrics and Gynecology

## 2022-03-15 ENCOUNTER — Inpatient Hospital Stay (HOSPITAL_COMMUNITY)
Admission: AD | Admit: 2022-03-15 | Discharge: 2022-03-15 | Disposition: A | Discharge status: Home / Self Care | Payer: 59 | Attending: Obstetrics and Gynecology | Admitting: Obstetrics and Gynecology

## 2022-03-15 ENCOUNTER — Ambulatory Visit (HOSPITAL_BASED_OUTPATIENT_CLINIC_OR_DEPARTMENT_OTHER): Payer: 59

## 2022-03-15 VITALS — BP 137/94 | HR 99

## 2022-03-15 DIAGNOSIS — O24112 Pre-existing diabetes mellitus, type 2, in pregnancy, second trimester: Secondary | ICD-10-CM

## 2022-03-15 DIAGNOSIS — O24113 Pre-existing diabetes mellitus, type 2, in pregnancy, third trimester: Secondary | ICD-10-CM

## 2022-03-15 DIAGNOSIS — O99212 Obesity complicating pregnancy, second trimester: Secondary | ICD-10-CM | POA: Insufficient documentation

## 2022-03-15 DIAGNOSIS — Z3A32 32 weeks gestation of pregnancy: Secondary | ICD-10-CM | POA: Diagnosis not present

## 2022-03-15 DIAGNOSIS — O163 Unspecified maternal hypertension, third trimester: Secondary | ICD-10-CM

## 2022-03-15 DIAGNOSIS — O10913 Unspecified pre-existing hypertension complicating pregnancy, third trimester: Secondary | ICD-10-CM | POA: Insufficient documentation

## 2022-03-15 DIAGNOSIS — E119 Type 2 diabetes mellitus without complications: Secondary | ICD-10-CM

## 2022-03-15 DIAGNOSIS — Z362 Encounter for other antenatal screening follow-up: Secondary | ICD-10-CM | POA: Insufficient documentation

## 2022-03-15 DIAGNOSIS — O26893 Other specified pregnancy related conditions, third trimester: Secondary | ICD-10-CM | POA: Insufficient documentation

## 2022-03-15 DIAGNOSIS — O09292 Supervision of pregnancy with other poor reproductive or obstetric history, second trimester: Secondary | ICD-10-CM

## 2022-03-15 DIAGNOSIS — Z794 Long term (current) use of insulin: Secondary | ICD-10-CM

## 2022-03-15 DIAGNOSIS — O99213 Obesity complicating pregnancy, third trimester: Secondary | ICD-10-CM | POA: Insufficient documentation

## 2022-03-15 DIAGNOSIS — O09293 Supervision of pregnancy with other poor reproductive or obstetric history, third trimester: Secondary | ICD-10-CM

## 2022-03-15 DIAGNOSIS — O09892 Supervision of other high risk pregnancies, second trimester: Secondary | ICD-10-CM

## 2022-03-15 DIAGNOSIS — O09893 Supervision of other high risk pregnancies, third trimester: Secondary | ICD-10-CM

## 2022-03-15 DIAGNOSIS — O98713 Human immunodeficiency virus [HIV] disease complicating pregnancy, third trimester: Secondary | ICD-10-CM

## 2022-03-15 DIAGNOSIS — E669 Obesity, unspecified: Secondary | ICD-10-CM

## 2022-03-15 DIAGNOSIS — B009 Herpesviral infection, unspecified: Secondary | ICD-10-CM

## 2022-03-15 DIAGNOSIS — R030 Elevated blood-pressure reading, without diagnosis of hypertension: Secondary | ICD-10-CM

## 2022-03-15 DIAGNOSIS — O99891 Other specified diseases and conditions complicating pregnancy: Secondary | ICD-10-CM

## 2022-03-15 DIAGNOSIS — B20 Human immunodeficiency virus [HIV] disease: Secondary | ICD-10-CM | POA: Diagnosis not present

## 2022-03-15 DIAGNOSIS — O98513 Other viral diseases complicating pregnancy, third trimester: Secondary | ICD-10-CM

## 2022-03-15 DIAGNOSIS — O34219 Maternal care for unspecified type scar from previous cesarean delivery: Secondary | ICD-10-CM

## 2022-03-15 LAB — COMPREHENSIVE METABOLIC PANEL
ALT: 13 U/L (ref 0–44)
AST: 16 U/L (ref 15–41)
Albumin: 3 g/dL — ABNORMAL LOW (ref 3.5–5.0)
Alkaline Phosphatase: 81 U/L (ref 38–126)
Anion gap: 11 (ref 5–15)
BUN: 5 mg/dL — ABNORMAL LOW (ref 6–20)
CO2: 19 mmol/L — ABNORMAL LOW (ref 22–32)
Calcium: 8.9 mg/dL (ref 8.9–10.3)
Chloride: 106 mmol/L (ref 98–111)
Creatinine, Ser: 0.63 mg/dL (ref 0.44–1.00)
GFR, Estimated: >60 mL/min (ref >60)
Glucose, Bld: 109 mg/dL — ABNORMAL HIGH (ref 70–99)
Potassium: 3.8 mmol/L (ref 3.5–5.1)
Sodium: 136 mmol/L (ref 135–145)
Total Bilirubin: 0.1 mg/dL — ABNORMAL LOW (ref 0.3–1.2)
Total Protein: 6.6 g/dL (ref 6.5–8.1)

## 2022-03-15 LAB — URINALYSIS, ROUTINE W REFLEX MICROSCOPIC
Bacteria, UA: NONE SEEN
Bilirubin Urine: NEGATIVE
Glucose, UA: NEGATIVE mg/dL
Hgb urine dipstick: NEGATIVE
Ketones, ur: NEGATIVE mg/dL
Leukocytes,Ua: NEGATIVE
Nitrite: NEGATIVE
Protein, ur: 30 mg/dL — AB
Specific Gravity, Urine: 1.023 (ref 1.005–1.030)
pH: 6 (ref 5.0–8.0)

## 2022-03-15 LAB — CBC
HCT: 33.5 % — ABNORMAL LOW (ref 36.0–46.0)
Hemoglobin: 11.9 g/dL — ABNORMAL LOW (ref 12.0–15.0)
MCH: 28.4 pg (ref 26.0–34.0)
MCHC: 35.5 g/dL (ref 30.0–36.0)
MCV: 80 fL (ref 80.0–100.0)
Platelets: 400 10*3/uL (ref 150–400)
RBC: 4.19 MIL/uL (ref 3.87–5.11)
RDW: 13.4 % (ref 11.5–15.5)
WBC: 8.1 10*3/uL (ref 4.0–10.5)
nRBC: 0 % (ref 0.0–0.2)

## 2022-03-15 LAB — PROTEIN / CREATININE RATIO, URINE
Creatinine, Urine: 172 mg/dL
Protein Creatinine Ratio: 0.13 mg/mg{Cre} (ref 0.00–0.15)
Total Protein, Urine: 22 mg/dL

## 2022-03-15 MED ORDER — INSULIN ASPART PROT & ASPART (70-30 MIX) 100 UNIT/ML ~~LOC~~ SUSP: 2.0000 [IU] | Freq: Every day | SUBCUTANEOUS | Status: DC

## 2022-03-15 MED ORDER — INSULIN ASPART 100 UNIT/ML IJ SOLN
2.0000 [IU] | Freq: Once | INTRAMUSCULAR | Status: AC
  Administered 2022-03-15: 2 [IU] via SUBCUTANEOUS

## 2022-03-15 NOTE — Discharge Instructions (Signed)

## 2022-03-15 NOTE — MAU Note (Signed)
...  Kelly Hunter is a 32 y.o. at 59w6dhere in MAU reporting: Sent over from MFM for blood pressure evaluation. She reports she has had two HA's this past week which was the first time in a while. Denies current HA, visual disturbances, and RUQ/epigastric pain. Endorses bilateral swelling of feet and ankles. Denies VB or LOF. +FM.  Hx of Pre-E. Prior C/S. Next OB appointment 2/20.  Onset of complaint: Today Pain score: Denies pain.  FHT: 162 initial external

## 2022-03-15 NOTE — MAU Provider Note (Addendum)
History     CSN: [id number]  Arrival date and time: 03/15/22 1700   Event Date/Time   First Provider Initiated Contact with Patient 03/15/22 1751      Chief Complaint  Patient presents with   Hypertension   Hypertension  Kelly Hunter is a [id number] at 32.6 wks who presents to MAU today with a chief concern of new onset hypertension in the third trimester of pregnancy. Pt states that she had two elevated blood pressure at her MFM appointment today and was told to come to MAU for workup, pt has had elevated diastolic blood pressures since she had arrived on the unit. Pt endorses headache and feeling "puffy" like she is holding water for the last four days. The pt endorses a hx of pre-e in previous pregnancy in 2020. Pt denies visual changes and dizziness. Pt denies LOF, bleeding, abnormal vaginal discharge. Pt endorses normal fetal movement, irregular contractions, and pelvic soreness. Pt states that she has not taken any medications for blood pressure before, but says that she has a hx of gestational diabetes in previous pregnancy and DM type 2 in this pregnancy.  OB History     Gravida  3   Para  0   Term  0   Preterm  0   AB  1   Living  1      SAB  0   IAB  1   Ectopic  0   Multiple  0   Live Births  1           Past Medical History:  Diagnosis Date   Acne    Allergy    Allegra   Asthma    mild; onset in childhood.  No hospitalizations.   ASTHMA, EXERCISE INDUCED    Cervical high risk HPV (human papillomavirus) test positive 07/2019   cervical cancer screening due in December, 2021   Chlamydia 03/03/2012   CIN I (cervical intraepithelial neoplasia I) 08/31/2013, 05/01/17   colposcopy by Toney Rakes; s/p CO2 laser treatment. repeat colposcopy by Dr. Quincy Simmonds.   Eczema    Elevated blood sugar 12/25/2017   Encounter for induction of labor 06/17/2020   Genital warts 08/31/2013   [place] treatment; Toney Rakes.   Grieving 12/25/2017   Herpes     Hidradenitis    Hidradenitis axillaris 05/21/2014   HIV (human immunodeficiency virus infection) ([place])    HIV (human immunodeficiency virus) risk factors complicating pregnancy [id number]   HSV-2 seropositive    Migraine headache without aura    Mixed anxiety depressive disorder 06/16/2020   Moderate episode of recurrent major depressive disorder ([place]) 10/24/2018   Substance abuse ([place])    Type 2 diabetes mellitus without complication, without long-term current use of insulin ([place]) 11/17/2014   Vaccine counseling 01/22/2020   Vaginal Pap smear, abnormal    VIN I (vulvar intraepithelial neoplasia I) 08/31/2013   s/p CO2 treatment; [facility]/gyn.    Past Surgical History:  Procedure Laterality Date   BREAST SURGERY  02/01/2008   Reduction   CESAREAN SECTION N/A 06/18/2020   Procedure: CESAREAN SECTION;  Surgeon: Sanjuana Kava, MD;  Location: [facility];  Service: Obstetrics;  Laterality: N/A;   CO2 LASER APPLICATION N/A [id number]   Procedure: C02 laser of vagina and cervix;  Surgeon: Terrance Mass, MD;  Location: [facility];  Service: Gynecology;  Laterality: N/A;   INCISION AND DRAINAGE ABSCESS Left 03/09/2017   Procedure: INCISION AND DRAINAGE ABSCESS LEFT BREAST ABSCESS;  Surgeon: Erroll Luna, MD;  Location: [facility];  Service: General;  Laterality: Left;    Family History  Problem Relation Age of Onset   Diabetes Mother    Hypertension Mother    Hyperlipidemia Mother    Mental illness Mother    Pulmonary embolism Mother    Cancer Paternal Aunt        Cervical or Ovarian per patient   Cancer Maternal Grandfather        ?   Heart disease Neg Hx    Asthma Neg Hx     Social History   Tobacco Use   Smoking status: Never   Smokeless tobacco: Never  Vaping Use   Vaping Use: Never used  Substance Use Topics   Alcohol use: Not Currently    Comment: occasionally   Drug use: No    Types: Marijuana    Comment: last use july 2023    Allergies:  Allergies  Allergen Reactions    Cashew Nut Oil     Pt states she is allergic to cashews and to all nuts   Other Nausea And Vomiting and Swelling    SWELLING REACTION UNSPECIFIED  Tree Nuts (Almonds)   Peanut Oil Nausea And Vomiting and Swelling    SWELLING REACTION UNSPECIFIED    Peanut-Containing Drug Products Nausea And Vomiting and Swelling    SWELLING REACTION UNSPECIFIED    Pyridium [Phenazopyridine Hcl] Nausea And Vomiting   Bactrim [Sulfamethoxazole-Trimethoprim] Rash    Medications Prior to Admission  Medication Sig Dispense Refill Last Dose   dolutegravir (TIVICAY) 50 MG tablet Take 1 tablet (50 mg total) by mouth daily. 30 tablet 8 03/14/2022   emtricitabine-tenofovir AF (DESCOVY) 200-25 MG tablet Take 1 tablet by mouth daily. 30 tablet 8 03/14/2022   insulin lispro (HUMALOG) 100 UNIT/ML injection Inject 2 Units into the skin 3 (three) times daily before meals. Sliding scale   03/14/2022   insulin NPH Human (NOVOLIN N) 100 UNIT/ML injection Inject 8 Units into the skin 2 (two) times daily.   03/14/2022   valACYclovir (VALTREX) 1000 MG tablet Take 1 tablet by mouth once daily. 30 tablet 0 03/14/2022    Review of Systems  Constitutional: Negative.   Respiratory: Negative.    Cardiovascular: Negative.   Genitourinary:  Positive for pelvic pain. Negative for vaginal bleeding, vaginal discharge and vaginal pain.  Skin: Negative.   Extremities: Positive for swelling Physical Exam   Blood pressure 133/84, pulse 98, temperature 99.1 F (37.3 C), temperature source Oral, resp. rate 17, height 5' 3.5" (1.613 m), weight 105.3 kg, last menstrual period 08/05/2021, SpO2 99 %, unknown if currently breastfeeding.  Physical Exam Constitutional:      Appearance: Normal appearance. She is obese.  HENT:     Head: Normocephalic.  Cardiovascular:     Rate and Rhythm: Normal rate and regular rhythm.     Comments: Negative for pitting edema Pulmonary:     Effort: Pulmonary effort is normal.     Breath sounds: Normal  breath sounds.  Neurological:     Mental Status: She is alert.    MAU Course  Procedures  MDM - collect UA, protein creatinine ratio urine, CMP, CBC - continuous BP monitoring - Gave 2 U of Novolog for patient to eat dinner Lab results Results for orders placed or performed during the hospital encounter of 03/15/22 (from the past 24 hour(s))  CBC     Status: Abnormal   Collection Time: 03/15/22  5:48 PM  Result Value Ref Range   WBC 8.1  4.0 - 10.5 K/uL   RBC 4.19 3.87 - 5.11 MIL/uL   Hemoglobin 11.9 (L) 12.0 - 15.0 g/dL   HCT 33.5 (L) 36.0 - 46.0 %   MCV 80.0 80.0 - 100.0 fL   MCH 28.4 26.0 - 34.0 pg   MCHC 35.5 30.0 - 36.0 g/dL   RDW 13.4 11.5 - 15.5 %   Platelets 400 150 - 400 K/uL   nRBC 0.0 0.0 - 0.2 %  Comprehensive metabolic panel     Status: Abnormal   Collection Time: 03/15/22  5:48 PM  Result Value Ref Range   Sodium 136 135 - 145 mmol/L   Potassium 3.8 3.5 - 5.1 mmol/L   Chloride 106 98 - 111 mmol/L   CO2 19 (L) 22 - 32 mmol/L   Glucose, Bld 109 (H) 70 - 99 mg/dL   BUN 5 (L) 6 - 20 mg/dL   Creatinine, Ser 0.63 0.44 - 1.00 mg/dL   Calcium 8.9 8.9 - 10.3 mg/dL   Total Protein 6.6 6.5 - 8.1 g/dL   Albumin 3.0 (L) 3.5 - 5.0 g/dL   AST 16 15 - 41 U/L   ALT 13 0 - 44 U/L   Alkaline Phosphatase 81 38 - 126 U/L   Total Bilirubin 0.1 (L) 0.3 - 1.2 mg/dL   GFR, Estimated >60 >60 mL/min   Anion gap 11 5 - 15  Protein / creatinine ratio, urine     Status: None   Collection Time: 03/15/22  6:08 PM  Result Value Ref Range   Creatinine, Urine 172 mg/dL   Total Protein, Urine 22 mg/dL   Protein Creatinine Ratio 0.13 0.00 - 0.15 mg/mg[Cre]  Urinalysis, Routine w reflex microscopic -Urine, Clean Catch     Status: Abnormal   Collection Time: 03/15/22  6:08 PM  Result Value Ref Range   Color, Urine YELLOW YELLOW   APPearance CLEAR CLEAR   Specific Gravity, Urine 1.023 1.005 - 1.030   pH 6.0 5.0 - 8.0   Glucose, UA NEGATIVE NEGATIVE mg/dL   Hgb urine dipstick NEGATIVE  NEGATIVE   Bilirubin Urine NEGATIVE NEGATIVE   Ketones, ur NEGATIVE NEGATIVE mg/dL   Protein, ur 30 (A) NEGATIVE mg/dL   Nitrite NEGATIVE NEGATIVE   Leukocytes,Ua NEGATIVE NEGATIVE   RBC / HPF 0-5 0 - 5 RBC/hpf   WBC, UA 0-5 0 - 5 WBC/hpf   Bacteria, UA NONE SEEN NONE SEEN   Squamous Epithelial / HPF 0-5 0 - 5 /HPF   Mucus PRESENT      Assessment and Plan  Clinical Impression: The pt is well-developed adult female who is alert and oriented x3 in no acute distress. The pt should remain in observation until her UA, urine protein creatinine ratio comes back to r/o Pre--eclampsia which would be indicated by two elevated blood pressures >140/90 mmHg four hours apart, elevated liver enzymes, elevated SCR, and urine protein level. The pt does not meet criteria for pre-eclampsia because Scr, serum protein, liver enzymes are wnl, pt most likely has diagnoses of new onset hypertension. The patient will not be started on medications at this time, but recommended follow up without outpatient OB for blood pressure check later this week.  Disposition: Discharge to home with outpatient follow-up in 2-3 days for blood pressure recheck.  The provider contacted Dr. Charlena Cross at [facility] to let them know the patient's status and that she will require follow-up.  Education: I spent 5 minutes with the patient discussing the importance of follow up  BP and blood pressure checking. I encouraged the patient to call Dr. Reginia Naas office first thing in the morning to schedule an appointment and that they will be expecting her call.  The patient verbalized understanding and is agreeable to the plan.   Dyanne Iha 03/15/2022, 6:19 PM

## 2022-03-15 NOTE — MAU Provider Note (Signed)
History     CSN: [id number]  Arrival date and time: 03/15/22 1700   Event Date/Time   First Provider Initiated Contact with Patient 03/15/22 1751      Chief Complaint  Patient presents with   Hypertension   HPI  Kelly Hunter is a 32 y.o. G3P0011 at 11w6dwho presents for evaluation of elevated blood pressures. Patient reports she was at [facility] today and had new onset HTN. She has a hx of preeclampsia with her previous pregnancy and was sent over for further evaluation. She denies any HA, visual changes or epigastric pain. She denies any vaginal bleeding, discharge, and leaking of fluid. Denies any constipation, diarrhea or any urinary complaints. Reports normal fetal movement.   OB History     Gravida  3   Para  0   Term  0   Preterm  0   AB  1   Living  1      SAB  0   IAB  1   Ectopic  0   Multiple  0   Live Births  1           Past Medical History:  Diagnosis Date   Acne    Allergy    Allegra   Asthma    mild; onset in childhood.  No hospitalizations.   ASTHMA, EXERCISE INDUCED    Cervical high risk HPV (human papillomavirus) test positive 07/2019   cervical cancer screening due in December, 2021   Chlamydia 03/03/2012   CIN I (cervical intraepithelial neoplasia I) 08/31/2013, 05/01/17   colposcopy by FToney Rakes s/p CO2 laser treatment. repeat colposcopy by Dr. SQuincy Simmonds   Eczema    Elevated blood sugar 12/25/2017   Encounter for induction of labor 06/17/2020   Genital warts 08/31/2013   Aldara treatment; FToney Rakes   Grieving 12/25/2017   Herpes    Hidradenitis    Hidradenitis axillaris 05/21/2014   [place]    HIV (human immunodeficiency virus) risk factors complicating pregnancy [id number]  HSV-2 seropositive    Migraine headache without aura    Mixed anxiety depressive disorder 06/16/2020   Moderate episode of recurrent major depressive disorder ([place] 10/24/2018   Substance abuse ([place]     Type 2 diabetes mellitus without complication, without long-term current use of insulin [place] 11/17/2014   Vaccine counseling 01/22/2020   Vaginal Pap smear, abnormal    VIN I (vulvar intraepithelial neoplasia I) 08/31/2013   s/p CO2 treatment; Fernandez/gyn.    Past Surgical History:  Procedure Laterality Date   BREAST SURGERY  02/01/2008   Reduction   CESAREAN SECTION N/A 06/18/2020   Procedure: CESAREAN SECTION;  Surgeon: PSanjuana Kava MD;  [facility];  Service: Obstetrics;  Laterality: N/A;   CO2 LASER APPLICATION N/A [id number]  Procedure: C02 laser of vagina and cervix;  Surgeon: JTerrance Mass MD;  [facility];  Service: Gynecology;  Laterality: N/A;   INCISION AND DRAINAGE ABSCESS Left 03/09/2017   Procedure: INCISION AND DRAINAGE ABSCESS LEFT BREAST ABSCESS;  Surgeon: CErroll Luna MD;  [place] [facility]  Service: General;  Laterality: Left;    Family History  Problem Relation Age of Onset   Diabetes Mother    Hypertension Mother    Hyperlipidemia Mother    Mental illness Mother    Pulmonary embolism Mother    Cancer Paternal Aunt        Cervical or Ovarian per patient   Cancer Maternal Grandfather        ?  Heart disease Neg Hx    Asthma Neg Hx     Social History   Tobacco Use   Smoking status: Never   Smokeless tobacco: Never  Vaping Use   Vaping Use: Never used  Substance Use Topics   Alcohol use: Not Currently    Comment: occasionally   Drug use: No    Types: Marijuana    Comment: last use july 2023    Allergies:  Allergies  Allergen Reactions   Cashew Nut Oil     Pt states she is allergic to cashews and to all nuts   Other Nausea And Vomiting and Swelling    SWELLING REACTION UNSPECIFIED  Tree Nuts (Almonds)   Peanut Oil Nausea And Vomiting and Swelling    SWELLING REACTION UNSPECIFIED    Peanut-Containing Drug Products Nausea And Vomiting and Swelling    SWELLING REACTION UNSPECIFIED    Pyridium [Phenazopyridine Hcl] Nausea And  Vomiting   Bactrim [Sulfamethoxazole-Trimethoprim] Rash    Medications Prior to Admission  Medication Sig Dispense Refill Last Dose   dolutegravir (TIVICAY) 50 MG tablet Take 1 tablet (50 mg total) by mouth daily. 30 tablet 8 03/14/2022   emtricitabine-tenofovir AF (DESCOVY) 200-25 MG tablet Take 1 tablet by mouth daily. 30 tablet 8 03/14/2022   insulin lispro (HUMALOG) 100 UNIT/ML injection Inject 2 Units into the skin 3 (three) times daily before meals. Sliding scale   03/14/2022   insulin NPH Human (NOVOLIN N) 100 UNIT/ML injection Inject 8 Units into the skin 2 (two) times daily.   03/14/2022   valACYclovir (VALTREX) 1000 MG tablet Take 1 tablet by mouth once daily. 30 tablet 0 03/14/2022    Review of Systems  Constitutional: Negative.  Negative for fatigue and fever.  HENT: Negative.    Respiratory: Negative.  Negative for shortness of breath.   Cardiovascular: Negative.  Negative for chest pain.  Gastrointestinal: Negative.  Negative for abdominal pain, constipation, diarrhea, nausea and vomiting.  Genitourinary: Negative.  Negative for dysuria, vaginal bleeding and vaginal discharge.  Neurological: Negative.  Negative for dizziness and headaches.   Physical Exam   Blood pressure 136/87, pulse 99, temperature 99.1 F (37.3 C), temperature source Oral, resp. rate 17, height 5' 3.5" (1.613 m), weight 105.3 kg, last menstrual period 08/05/2021, SpO2 99 %, unknown if currently breastfeeding.  Patient Vitals for the past 24 hrs:  BP Temp Temp src Pulse Resp SpO2 Height Weight  03/15/22 1901 136/87 -- -- 99 -- -- -- --  03/15/22 1846 (!) 138/96 -- -- (!) 119 -- -- -- --  03/15/22 1831 123/82 -- -- 95 -- -- -- --  03/15/22 1815 (!) 136/95 -- -- 95 -- -- -- --  03/15/22 1800 133/84 -- -- 98 -- -- -- --  03/15/22 1745 (!) 124/97 -- -- 98 -- -- -- --  03/15/22 1741 136/86 -- -- 100 -- -- -- --  03/15/22 1716 (!) 132/91 99.1 F (37.3 C) Oral 99 17 99 % -- --  03/15/22 1714 -- -- -- -- --  -- 5' 3.5" (1.613 m) 105.3 kg    Physical Exam Vitals and nursing note reviewed.  Constitutional:      General: She is not in acute distress.    Appearance: She is well-developed.  HENT:     Head: Normocephalic.  Eyes:     Pupils: Pupils are equal, round, and reactive to light.  Cardiovascular:     Rate and Rhythm: Normal rate and regular rhythm.  Heart sounds: Normal heart sounds.  Pulmonary:     Effort: Pulmonary effort is normal. No respiratory distress.     Breath sounds: Normal breath sounds.  Abdominal:     General: Bowel sounds are normal. There is no distension.     Palpations: Abdomen is soft.     Tenderness: There is no abdominal tenderness.  Skin:    General: Skin is warm and dry.  Neurological:     Mental Status: She is alert and oriented to person, place, and time.  Psychiatric:        Mood and Affect: Mood normal.        Behavior: Behavior normal.        Thought Content: Thought content normal.        Judgment: Judgment normal.    Fetal Tracing:  Baseline: 135 Variability: moderate Accels: 15x15 Decels: none  Toco: none  MAU Course  Procedures  Results for orders placed or performed during the hospital encounter of 03/15/22 (from the past 24 hour(s))  CBC     Status: Abnormal   Collection Time: 03/15/22  5:48 PM  Result Value Ref Range   WBC 8.1 4.0 - 10.5 K/uL   RBC 4.19 3.87 - 5.11 MIL/uL   Hemoglobin 11.9 (L) 12.0 - 15.0 g/dL   HCT 33.5 (L) 36.0 - 46.0 %   MCV 80.0 80.0 - 100.0 fL   MCH 28.4 26.0 - 34.0 pg   MCHC 35.5 30.0 - 36.0 g/dL   RDW 13.4 11.5 - 15.5 %   Platelets 400 150 - 400 K/uL   nRBC 0.0 0.0 - 0.2 %  Comprehensive metabolic panel     Status: Abnormal   Collection Time: 03/15/22  5:48 PM  Result Value Ref Range   Sodium 136 135 - 145 mmol/L   Potassium 3.8 3.5 - 5.1 mmol/L   Chloride 106 98 - 111 mmol/L   CO2 19 (L) 22 - 32 mmol/L   Glucose, Bld 109 (H) 70 - 99 mg/dL   BUN 5 (L) 6 - 20 mg/dL   Creatinine, Ser 0.63  0.44 - 1.00 mg/dL   Calcium 8.9 8.9 - 10.3 mg/dL   Total Protein 6.6 6.5 - 8.1 g/dL   Albumin 3.0 (L) 3.5 - 5.0 g/dL   AST 16 15 - 41 U/L   ALT 13 0 - 44 U/L   Alkaline Phosphatase 81 38 - 126 U/L   Total Bilirubin 0.1 (L) 0.3 - 1.2 mg/dL   GFR, Estimated >60 >60 mL/min   Anion gap 11 5 - 15  Protein / creatinine ratio, urine     Status: None   Collection Time: 03/15/22  6:08 PM  Result Value Ref Range   Creatinine, Urine 172 mg/dL   Total Protein, Urine 22 mg/dL   Protein Creatinine Ratio 0.13 0.00 - 0.15 mg/mg[Cre]  Urinalysis, Routine w reflex microscopic -Urine, Clean Catch     Status: Abnormal   Collection Time: 03/15/22  6:08 PM  Result Value Ref Range   Color, Urine YELLOW YELLOW   APPearance CLEAR CLEAR   Specific Gravity, Urine 1.023 1.005 - 1.030   pH 6.0 5.0 - 8.0   Glucose, UA NEGATIVE NEGATIVE mg/dL   Hgb urine dipstick NEGATIVE NEGATIVE   Bilirubin Urine NEGATIVE NEGATIVE   Ketones, ur NEGATIVE NEGATIVE mg/dL   Protein, ur 30 (A) NEGATIVE mg/dL   Nitrite NEGATIVE NEGATIVE   Leukocytes,Ua NEGATIVE NEGATIVE   RBC / HPF 0-5 0 - 5 RBC/hpf  WBC, UA 0-5 0 - 5 WBC/hpf   Bacteria, UA NONE SEEN NONE SEEN   Squamous Epithelial / HPF 0-5 0 - 5 /HPF   Mucus PRESENT      [place] MFM FETAL BPP WO NON STRESS  Result Date: 03/15/2022 ----------------------------------------------------------------------  OBSTETRICS REPORT                       (Signed Final 03/15/2022 04:40 pm) ---------------------------------------------------------------------- Patient Info  ID #:       [id number]                          D.O.B.:  1990-12-12 (32 yrs)  Name:       Kelly Hunter              Visit Date: 03/15/2022 12:04 pm ---------------------------------------------------------------------- Performed By  Attending:        Valeda Malm DO       Referred By:      Rex Kras HANDS  Performed By:     Stephenie Acres        [facility]                                   [facility]                                                             [place] [facility]                                                             Women ---------------------------------------------------------------------- Orders  #  Description                           Code        Ordered By  1  [place] MFM FETAL BPP WO NON               76819.01    [place] ----------------------------------------------------------------------  #  Order #                     Accession #                Episode #  1  [id number]                   [id number]                 [id number] ---------------------------------------------------------------------- Indications  Obesity complicating pregnancy, third          O99.213  trimester  Pre-existing diabetes, type 2, in pregnancy,   O24.113  third trimester ( insulin)  HIV affecting pregnancy, third trimester       O98.713  Poor obstetric history:  Previous severe        O09.299  preeclampsia  Previous cesarean delivery, antepartum         O34.219  Herpes simplex virus (HSV)                     O98.519 B00.9  Declined genetic screening  Normal FE at [facility]  [redacted] weeks gestation of pregnancy                Z3A.32  Elevated blood pressure affecting pregnancy    O13.3  in third trimester ---------------------------------------------------------------------- Vital Signs  BP:          137/94 ---------------------------------------------------------------------- Fetal Evaluation  Num Of Fetuses:         1  Fetal Heart Rate(bpm):  164  Cardiac Activity:       Observed  Presentation:           Cephalic  Placenta:               Posterior  P. Cord Insertion:      Previously visualized  Amniotic Fluid  AFI FV:      Within normal limits  AFI Sum(cm)     %Tile       Largest Pocket(cm)  16.43           59          6.1  RUQ(cm)       RLQ(cm)       LUQ(cm)        LLQ(cm)  3.53          6.1           2.22           4.58  ---------------------------------------------------------------------- Biophysical Evaluation  Amniotic F.V:   Pocket => 2 cm             F. Tone:        Observed  F. Movement:    Observed                   Score:          8/8  F. Breathing:   Observed ---------------------------------------------------------------------- Biometry  LV:        2.6  mm ---------------------------------------------------------------------- OB History  Gravidity:    3         Term:   1        Prem:   0        SAB:   0  TOP:          1       Ectopic:  0        Living: 1 ---------------------------------------------------------------------- Gestational Age  LMP:           31w 5d        Date:  08/05/21                   EDD:   05/12/22  Best:          Milderd Meager 6d     Det. By:  Previous Ultrasound      EDD:   05/04/22                                      (11/24/21) ---------------------------------------------------------------------- Anatomy  Cranium:               Appears normal  Kidneys:                Appear normal  Diaphragm:             Appears normal         Bladder:                Appears normal  Stomach:               Appears normal, left                         sided  Other:  Female gender previously seen. Anatomy prev seen ---------------------------------------------------------------------- Comments  The patient is here for a BPP for HIV, obesity, type 2  diabetes.  Her blood pressure was elevated today in the 130s  over 90s.  She denies headaches and vision changes.  She  reports that her blood sugars are also not well-controlled.  Her fasting is in the 140s to 160s.  She is going to increase  her NPH from 8 units at night to 12 units at night.  I also  discussed the importance of going to the MAU for  assessment of her blood pressure.  She has a history of  preeclampsia in a prior pregnancy. She is at [place] 6d. EDD of  05/04/2022 dated by: Previous Ultrasound  (11/24/21).  Sonographic findings  Single intrauterine pregnancy.   Fetal cardiac activity: Observed.  Presentation: Cephalic.  Interval fetal anatomy appears normal.  Amniotic fluid volume: Within normal limits. AFI: 16.43 cm.  MVP: 6.1 cm.  Placenta: Posterior.  BPP: 8/8.  Recommendations  - Sent to MAU for BP assessment  - Increase NPH to 12U at night (9pm) and increase by 2U  every night her fasting blood sugar remains elevated  - F/u next week for BPP and serial growth [place]  - Delivery around 37 weeks ----------------------------------------------------------------------                  Valeda Malm, DO Electronically Signed Final Report   03/15/2022 04:40 pm ----------------------------------------------------------------------    MDM Prenatal records from community office reviewed. Pregnancy complicated by hx preeclampsia, hx c/s, T2DM, HIV in pregnancy, HSV Labs ordered and reviewed.   UA CBC, CMP, Protein/creat ratio  Novolog 2U so patient can eat dinner. CBG 113 per CGM  Dr. Orlie Dakin notified of new onset HTN and need for follow up this week in office- MD will notify office and patient is to call first thing tomorrow to schedule appointment  Assessment and Plan   1. Elevated blood pressure affecting pregnancy in third trimester, antepartum   2. [redacted] weeks gestation of pregnancy     -Discharge home in stable condition -Preeclampsia precautions discussed -Patient advised to follow-up with OB this week for BP check -Patient may return to MAU as needed or if her condition were to change or worsen  Wende Mott, CNM 03/15/2022, 5:52 PM

## 2022-03-16 ENCOUNTER — Other Ambulatory Visit: Payer: Self-pay

## 2022-03-17 ENCOUNTER — Other Ambulatory Visit: Payer: Self-pay

## 2022-03-18 ENCOUNTER — Other Ambulatory Visit: Payer: Self-pay

## 2022-03-22 ENCOUNTER — Ambulatory Visit: Payer: 59 | Attending: Maternal & Fetal Medicine

## 2022-03-22 ENCOUNTER — Ambulatory Visit: Payer: 59

## 2022-03-22 VITALS — BP 120/76 | HR 101

## 2022-03-22 DIAGNOSIS — O99213 Obesity complicating pregnancy, third trimester: Secondary | ICD-10-CM | POA: Diagnosis present

## 2022-03-22 DIAGNOSIS — O09893 Supervision of other high risk pregnancies, third trimester: Secondary | ICD-10-CM | POA: Insufficient documentation

## 2022-03-22 DIAGNOSIS — B009 Herpesviral infection, unspecified: Secondary | ICD-10-CM

## 2022-03-22 DIAGNOSIS — O99212 Obesity complicating pregnancy, second trimester: Secondary | ICD-10-CM | POA: Diagnosis present

## 2022-03-22 DIAGNOSIS — O09292 Supervision of pregnancy with other poor reproductive or obstetric history, second trimester: Secondary | ICD-10-CM

## 2022-03-22 DIAGNOSIS — E119 Type 2 diabetes mellitus without complications: Secondary | ICD-10-CM | POA: Diagnosis not present

## 2022-03-22 DIAGNOSIS — Z98891 History of uterine scar from previous surgery: Secondary | ICD-10-CM | POA: Insufficient documentation

## 2022-03-22 DIAGNOSIS — O98513 Other viral diseases complicating pregnancy, third trimester: Secondary | ICD-10-CM

## 2022-03-22 DIAGNOSIS — O24113 Pre-existing diabetes mellitus, type 2, in pregnancy, third trimester: Secondary | ICD-10-CM

## 2022-03-22 DIAGNOSIS — Z3A33 33 weeks gestation of pregnancy: Secondary | ICD-10-CM

## 2022-03-22 DIAGNOSIS — E669 Obesity, unspecified: Secondary | ICD-10-CM

## 2022-03-22 DIAGNOSIS — O09293 Supervision of pregnancy with other poor reproductive or obstetric history, third trimester: Secondary | ICD-10-CM | POA: Diagnosis present

## 2022-03-22 DIAGNOSIS — R030 Elevated blood-pressure reading, without diagnosis of hypertension: Secondary | ICD-10-CM

## 2022-03-22 DIAGNOSIS — O98713 Human immunodeficiency virus [HIV] disease complicating pregnancy, third trimester: Secondary | ICD-10-CM

## 2022-03-22 DIAGNOSIS — B20 Human immunodeficiency virus [HIV] disease: Secondary | ICD-10-CM | POA: Diagnosis not present

## 2022-03-22 DIAGNOSIS — O34219 Maternal care for unspecified type scar from previous cesarean delivery: Secondary | ICD-10-CM

## 2022-03-22 DIAGNOSIS — O99891 Other specified diseases and conditions complicating pregnancy: Secondary | ICD-10-CM

## 2022-03-22 DIAGNOSIS — Z362 Encounter for other antenatal screening follow-up: Secondary | ICD-10-CM

## 2022-03-22 DIAGNOSIS — Z794 Long term (current) use of insulin: Secondary | ICD-10-CM

## 2022-03-22 DIAGNOSIS — O09892 Supervision of other high risk pregnancies, second trimester: Secondary | ICD-10-CM | POA: Diagnosis not present

## 2022-03-22 DIAGNOSIS — O24112 Pre-existing diabetes mellitus, type 2, in pregnancy, second trimester: Secondary | ICD-10-CM

## 2022-03-28 ENCOUNTER — Ambulatory Visit: Payer: 59 | Admitting: Infectious Disease

## 2022-03-29 ENCOUNTER — Ambulatory Visit: Payer: 59

## 2022-03-30 ENCOUNTER — Other Ambulatory Visit: Payer: 59

## 2022-03-30 ENCOUNTER — Ambulatory Visit: Payer: 59

## 2022-03-31 ENCOUNTER — Ambulatory Visit: Payer: 59 | Attending: Maternal & Fetal Medicine | Admitting: *Deleted

## 2022-03-31 ENCOUNTER — Encounter: Payer: Self-pay | Admitting: *Deleted

## 2022-03-31 ENCOUNTER — Ambulatory Visit: Payer: 59 | Admitting: *Deleted

## 2022-03-31 DIAGNOSIS — Z3A35 35 weeks gestation of pregnancy: Secondary | ICD-10-CM | POA: Diagnosis not present

## 2022-03-31 DIAGNOSIS — O24113 Pre-existing diabetes mellitus, type 2, in pregnancy, third trimester: Secondary | ICD-10-CM | POA: Insufficient documentation

## 2022-03-31 DIAGNOSIS — O0993 Supervision of high risk pregnancy, unspecified, third trimester: Secondary | ICD-10-CM | POA: Diagnosis not present

## 2022-03-31 NOTE — Procedures (Cosign Needed Addendum)
Kelly Hunter December 19, 1990 [redacted]w[redacted]d Fetus A Non-Stress Test Interpretation for 03/31/22  Indication: Diabetes  Fetal Heart Rate A Mode: External Baseline Rate (A): 145 bpm Variability: Minimal, Moderate Accelerations: 15 x 15 Decelerations: Variable Multiple birth?: No  Uterine Activity Mode: Palpation, Toco Contraction Frequency (min): none Resting Tone Palpated: Relaxed  Interpretation (Fetal Testing) Nonstress Test Interpretation: Reactive Overall Impression: Reassuring for gestational age Comments: Dr. SEpimenio Sarinreviewed tracing

## 2022-04-05 ENCOUNTER — Encounter (HOSPITAL_COMMUNITY): Payer: Self-pay

## 2022-04-05 ENCOUNTER — Ambulatory Visit: Payer: 59 | Attending: Maternal & Fetal Medicine

## 2022-04-05 ENCOUNTER — Ambulatory Visit: Payer: 59 | Admitting: *Deleted

## 2022-04-05 ENCOUNTER — Encounter: Payer: Self-pay | Admitting: *Deleted

## 2022-04-05 DIAGNOSIS — O24112 Pre-existing diabetes mellitus, type 2, in pregnancy, second trimester: Secondary | ICD-10-CM

## 2022-04-05 DIAGNOSIS — O98513 Other viral diseases complicating pregnancy, third trimester: Secondary | ICD-10-CM

## 2022-04-05 DIAGNOSIS — O09293 Supervision of pregnancy with other poor reproductive or obstetric history, third trimester: Secondary | ICD-10-CM

## 2022-04-05 DIAGNOSIS — E119 Type 2 diabetes mellitus without complications: Secondary | ICD-10-CM | POA: Diagnosis not present

## 2022-04-05 DIAGNOSIS — Z794 Long term (current) use of insulin: Secondary | ICD-10-CM

## 2022-04-05 DIAGNOSIS — O34219 Maternal care for unspecified type scar from previous cesarean delivery: Secondary | ICD-10-CM

## 2022-04-05 DIAGNOSIS — B009 Herpesviral infection, unspecified: Secondary | ICD-10-CM

## 2022-04-05 DIAGNOSIS — O24113 Pre-existing diabetes mellitus, type 2, in pregnancy, third trimester: Secondary | ICD-10-CM

## 2022-04-05 DIAGNOSIS — O98713 Human immunodeficiency virus [HIV] disease complicating pregnancy, third trimester: Secondary | ICD-10-CM | POA: Diagnosis not present

## 2022-04-05 DIAGNOSIS — O09292 Supervision of pregnancy with other poor reproductive or obstetric history, second trimester: Secondary | ICD-10-CM | POA: Diagnosis present

## 2022-04-05 DIAGNOSIS — R030 Elevated blood-pressure reading, without diagnosis of hypertension: Secondary | ICD-10-CM

## 2022-04-05 DIAGNOSIS — O09892 Supervision of other high risk pregnancies, second trimester: Secondary | ICD-10-CM

## 2022-04-05 DIAGNOSIS — O99212 Obesity complicating pregnancy, second trimester: Secondary | ICD-10-CM

## 2022-04-05 DIAGNOSIS — E669 Obesity, unspecified: Secondary | ICD-10-CM

## 2022-04-05 DIAGNOSIS — Z3A35 35 weeks gestation of pregnancy: Secondary | ICD-10-CM

## 2022-04-05 DIAGNOSIS — O99213 Obesity complicating pregnancy, third trimester: Secondary | ICD-10-CM

## 2022-04-05 DIAGNOSIS — Z362 Encounter for other antenatal screening follow-up: Secondary | ICD-10-CM | POA: Diagnosis present

## 2022-04-05 DIAGNOSIS — O99891 Other specified diseases and conditions complicating pregnancy: Secondary | ICD-10-CM

## 2022-04-05 DIAGNOSIS — B20 Human immunodeficiency virus [HIV] disease: Secondary | ICD-10-CM | POA: Diagnosis not present

## 2022-04-05 NOTE — Patient Instructions (Addendum)
Kelly Hunter  04/05/2022   Your procedure is scheduled on:  04/13/2022  Arrive at [facility] at [employer] [facility] on [address] at [facility]  and [address]. You are invited to use the FREE valet parking or use the Visitor's parking deck.  Pick up the phone at the desk and dial [phone].  Call this number if you have problems the morning of surgery: [phone]  Remember:   Do not eat food:(After Midnight) Desps de medianoche.  Do not drink clear liquids: (After Midnight) Desps de medianoche.  Take these medicines the morning of surgery with A SIP OF WATER:  Take half of your prescribed insulin dose the night before surgery.  No medications on day of surgery   Do not wear jewelry, make-up or nail polish.  Do not wear lotions, powders, or perfumes. Do not wear deodorant.  Do not shave 48 hours prior to surgery.  Do not bring valuables to the hospital.  [facility] is not   responsible for any belongings or valuables brought to the hospital.  Contacts, dentures or bridgework may not be worn into surgery.  Leave suitcase in the car. After surgery it may be brought to your room.  For patients admitted to the hospital, checkout time is 11:00 AM the day of              discharge.      Please read over the following fact sheets that you were given:     Preparing for Surgery

## 2022-04-07 ENCOUNTER — Other Ambulatory Visit (HOSPITAL_COMMUNITY): Payer: Self-pay

## 2022-04-11 ENCOUNTER — Other Ambulatory Visit (HOSPITAL_COMMUNITY): Payer: Self-pay

## 2022-04-11 ENCOUNTER — Ambulatory Visit (HOSPITAL_BASED_OUTPATIENT_CLINIC_OR_DEPARTMENT_OTHER): Payer: 59 | Admitting: *Deleted

## 2022-04-11 ENCOUNTER — Ambulatory Visit: Payer: 59

## 2022-04-11 VITALS — BP 131/89

## 2022-04-11 VITALS — BP 136/94 | HR 96

## 2022-04-11 DIAGNOSIS — Z3A36 36 weeks gestation of pregnancy: Secondary | ICD-10-CM

## 2022-04-11 DIAGNOSIS — Z3A35 35 weeks gestation of pregnancy: Secondary | ICD-10-CM | POA: Insufficient documentation

## 2022-04-11 DIAGNOSIS — O99213 Obesity complicating pregnancy, third trimester: Secondary | ICD-10-CM

## 2022-04-11 DIAGNOSIS — O09893 Supervision of other high risk pregnancies, third trimester: Secondary | ICD-10-CM

## 2022-04-11 DIAGNOSIS — O24113 Pre-existing diabetes mellitus, type 2, in pregnancy, third trimester: Secondary | ICD-10-CM

## 2022-04-11 DIAGNOSIS — O133 Gestational [pregnancy-induced] hypertension without significant proteinuria, third trimester: Secondary | ICD-10-CM | POA: Insufficient documentation

## 2022-04-11 DIAGNOSIS — Z98891 History of uterine scar from previous surgery: Secondary | ICD-10-CM

## 2022-04-11 DIAGNOSIS — O09293 Supervision of pregnancy with other poor reproductive or obstetric history, third trimester: Secondary | ICD-10-CM

## 2022-04-11 NOTE — Procedures (Signed)
Kelly Hunter March 03, 1990 [redacted]w[redacted]d Fetus A Non-Stress Test Interpretation for 04/11/22  Indication: Diabetes and HIV, GHTN  Fetal Heart Rate A Mode: External Baseline Rate (A): 140 bpm Variability: Moderate Accelerations: 15 x 15 Decelerations: None Multiple birth?: No  Uterine Activity Mode: Palpation, Toco Contraction Frequency (min): Occas Contraction Quality: Mild  Interpretation (Fetal Testing) Nonstress Test Interpretation: Reactive Comments: Dr. FAnnamaria Bootsreviewed tracing.

## 2022-04-12 ENCOUNTER — Telehealth (HOSPITAL_COMMUNITY): Payer: Self-pay | Admitting: *Deleted

## 2022-04-12 ENCOUNTER — Encounter (HOSPITAL_COMMUNITY)
Admission: RE | Admit: 2022-04-12 | Discharge: 2022-04-12 | Disposition: A | Discharge status: Home / Self Care | Payer: 59 | Source: Ambulatory Visit | Attending: Family Medicine | Admitting: Family Medicine

## 2022-04-12 DIAGNOSIS — Z01812 Encounter for preprocedural laboratory examination: Secondary | ICD-10-CM | POA: Insufficient documentation

## 2022-04-12 HISTORY — DX: Gestational (pregnancy-induced) hypertension without significant proteinuria, unspecified trimester: O13.9

## 2022-04-12 HISTORY — DX: Gestational diabetes mellitus in pregnancy, unspecified control: O24.419

## 2022-04-12 LAB — CBC WITH DIFFERENTIAL/PLATELET
Abs Immature Granulocytes: 0.04 10*3/uL (ref 0.00–0.07)
Basophils Absolute: 0 10*3/uL (ref 0.0–0.1)
Basophils Relative: 0 %
Eosinophils Absolute: 0.1 10*3/uL (ref 0.0–0.5)
Eosinophils Relative: 1 %
HCT: 33.5 % — ABNORMAL LOW (ref 36.0–46.0)
Hemoglobin: 12.2 g/dL (ref 12.0–15.0)
Immature Granulocytes: 1 %
Lymphocytes Relative: 26 %
Lymphs Abs: 2.2 10*3/uL (ref 0.7–4.0)
MCH: 28.4 pg (ref 26.0–34.0)
MCHC: 36.4 g/dL — ABNORMAL HIGH (ref 30.0–36.0)
MCV: 77.9 fL — ABNORMAL LOW (ref 80.0–100.0)
Monocytes Absolute: 0.5 10*3/uL (ref 0.1–1.0)
Monocytes Relative: 7 %
Neutro Abs: 5.5 10*3/uL (ref 1.7–7.7)
Neutrophils Relative %: 65 %
Platelets: 325 10*3/uL (ref 150–400)
RBC: 4.3 MIL/uL (ref 3.87–5.11)
RDW: 14.5 % (ref 11.5–15.5)
WBC: 8.3 10*3/uL (ref 4.0–10.5)
nRBC: 0 % (ref 0.0–0.2)

## 2022-04-12 LAB — RPR: RPR Ser Ql: NONREACTIVE

## 2022-04-12 NOTE — Telephone Encounter (Signed)
Verified with Dr Alesia Richards that she does not want IV AZT prior to CS due to labs being undetectable in the office.

## 2022-04-12 NOTE — Anesthesia Preprocedure Evaluation (Signed)
Anesthesia Evaluation  Patient identified by MRN, date of birth, ID band Patient awake    Reviewed: Allergy & Precautions, Patient's Chart, lab work & pertinent test results  History of Anesthesia Complications Negative for: history of anesthetic complications  Airway Mallampati: II  TM Distance: >3 FB Neck ROM: Full    Dental no notable dental hx.    Pulmonary asthma    Pulmonary exam normal        Cardiovascular hypertension, Normal cardiovascular exam     Neuro/Psych  Headaches  Anxiety Depression       GI/Hepatic negative GI ROS, Neg liver ROS,,,  Endo/Other  diabetes, Type 2, Insulin Dependent  Morbid obesity  Renal/GU negative Renal ROS  negative genitourinary   Musculoskeletal negative musculoskeletal ROS (+)    Abdominal   Peds  Hematology  (+) HIV  Anesthesia Other Findings Day of surgery medications reviewed with patient.  Reproductive/Obstetrics (+) Pregnancy (Hx of C/S x1)                             Anesthesia Physical Anesthesia Plan  ASA: 3  Anesthesia Plan: Spinal   Post-op Pain Management:    Induction:   PONV Risk Score and Plan: 4 or greater and Treatment may vary due to age or medical condition, Ondansetron and Dexamethasone  Airway Management Planned: Natural Airway  Additional Equipment: None  Intra-op Plan:   Post-operative Plan:   Informed Consent: I have reviewed the patients History and Physical, chart, labs and discussed the procedure including the risks, benefits and alternatives for the proposed anesthesia with the patient or authorized representative who has indicated his/her understanding and acceptance.       Plan Discussed with: CRNA  Anesthesia Plan Comments:         Anesthesia Quick Evaluation

## 2022-04-12 NOTE — H&P (Signed)
Kelly Hunter is a 32 y.o. female presenting for a repeat cesarean section. [id number] at [redacted] weeks EGA LMP 08/01/21, EDC 05/04/22 by first trimester ultrasound.  Delivery at 37 weeks is indicated for history of Gestational HTN and HIV positive in current pregnancy.  Recent HIV Viral load on 04/07/2022 was undetectable at less then 20 copies/ml. Patient has been on Trivicay and Descovy during the pregnancy.   Prenatal course also significant for: Pregestational type 2 DM and on insulin with fairly well controlled finger stick glucose.  HSV type 1 on valtrex.   OB History     Gravida  3   Para  1   Term  1   Preterm  0   AB  1   Living  1      SAB  0   IAB  1   Ectopic  0   Multiple  0   Live Births  1          Past Medical History:  Diagnosis Date   Acne    Allergy    Allegra   Asthma    mild; onset in childhood.  No hospitalizations.   ASTHMA, EXERCISE INDUCED    Cervical high risk HPV (human papillomavirus) test positive 07/2019   cervical cancer screening due in December, 2021   Chlamydia 03/03/2012   CIN I (cervical intraepithelial neoplasia I) 08/31/2013, 05/01/17   colposcopy by Toney Rakes; s/p CO2 laser treatment. repeat colposcopy by Dr. Quincy Simmonds.   Eczema    Elevated blood sugar 12/25/2017   Encounter for induction of labor 06/17/2020   Genital warts 08/31/2013   [place] treatment; Toney Rakes.   Gestational diabetes    Grieving 12/25/2017   Herpes    Hidradenitis    Hidradenitis axillaris 05/21/2014   HIV (human immunodeficiency virus infection) ([place])    HIV (human immunodeficiency virus) risk factors complicating pregnancy [id number]   HSV-2 seropositive    Migraine headache without aura    Mixed anxiety depressive disorder 06/16/2020   Moderate episode of recurrent major depressive disorder ([place]) 10/24/2018   Pregnancy induced hypertension    Substance abuse ([place])    Type 2 diabetes mellitus without complication, without long-term current use of  insulin ([place]) 11/17/2014   Vaccine counseling 01/22/2020   Vaginal Pap smear, abnormal    VIN I (vulvar intraepithelial neoplasia I) 08/31/2013   s/p CO2 treatment; [facility]/gyn.   Past Surgical History:  Procedure Laterality Date   BREAST SURGERY  02/01/2008   Reduction   CESAREAN SECTION N/A 06/18/2020   Procedure: CESAREAN SECTION;  Surgeon: Sanjuana Kava, MD;  Location: [facility];  Service: Obstetrics;  Laterality: N/A;   CO2 LASER APPLICATION N/A [id number]   Procedure: C02 laser of vagina and cervix;  Surgeon: Terrance Mass, MD;  Location: [facility];  Service: Gynecology;  Laterality: N/A;   INCISION AND DRAINAGE ABSCESS Left 03/09/2017   Procedure: INCISION AND DRAINAGE ABSCESS LEFT BREAST ABSCESS;  Surgeon: Erroll Luna, MD;  Location: [facility];  Service: General;  Laterality: Left;   Family History: family history includes Cancer in her maternal grandfather and paternal aunt; Diabetes in her mother; Hyperlipidemia in her mother; Hypertension in her mother; Mental illness in her mother; Pulmonary embolism in her mother. Social History:  reports that she has never smoked. She has never used smokeless tobacco. She reports that she does not currently use alcohol. She reports that she does not use drugs.     Maternal Diabetes: Yes:  Diabetes Type:  Pre-pregnancy, Insulin/Medication controlled Genetic Screening: Declined Maternal Ultrasounds/Referrals: Normal EFW 04/05/22: 5 lbs 15 oz (41st%) Posterior placenta.  Fetal Ultrasounds or other Referrals:  None Maternal Substance Abuse:  No Significant Maternal Medications:  Meds include: Other:  Significant Maternal Lab Results:  Group B Strep positive Number of Prenatal Visits:greater than 3 verified prenatal visits Other Comments:  None  Review of Systems History  Constitutional: Denies fevers/chills Cardiovascular: Denies chest pain or palpitations Pulmonary: Denies coughing or wheezing Gastrointestinal: Denies nausea, vomiting or  diarrhea Genitourinary: Denies pelvic pain, unusual vaginal bleeding, unusual vaginal discharge, dysuria, urgency or frequency.  Musculoskeletal: Denies muscle or joint aches and pain.  Neurology: Denies abnormal sensations such as tingling or numbness.   Last menstrual period 08/05/2021, unknown if currently breastfeeding. Exam Physical Exam  Constitutional: She is oriented to person, place, and time. She appears well-developed and well-nourished.  HENT:  Head: Normocephalic and atraumatic.  Neck: Normal range of motion.  Cardiovascular: Normal rate.    Respiratory: Effort normal.   GI: Soft.  Skin: Skin is warm and dry.  Psychiatric: She has a normal mood and affect. Her behavior is normal.   Genitourinary: Gravid uterus, appropriate for gestational age.    Prenatal labs: ABO, Rh: --/--/B POS (03/12 1100) Antibody: POS (03/12 1100) Rubella: Immune (09/26 0000) RPR: NON REACTIVE (03/12 1101)  HBsAg:    HIV: Reactive (01/18 0000)  GBS:   Positive  Recent Results (from the past 2160 hour(s))  OB RESULTS CONSOLE HIV antibody     Status: None   Collection Time: 02/17/22 12:00 AM  Result Value Ref Range   HIV Reactive   CBC     Status: Abnormal   Collection Time: 03/15/22  5:48 PM  Result Value Ref Range   WBC 8.1 4.0 - 10.5 K/uL   RBC 4.19 3.87 - 5.11 MIL/uL   Hemoglobin 11.9 (L) 12.0 - 15.0 g/dL   HCT 33.5 (L) 36.0 - 46.0 %   MCV 80.0 80.0 - 100.0 fL   MCH 28.4 26.0 - 34.0 pg   MCHC 35.5 30.0 - 36.0 g/dL   RDW 13.4 11.5 - 15.5 %   Platelets 400 150 - 400 K/uL   nRBC 0.0 0.0 - 0.2 %    Comment: Performed at [facility], [place] [address], [place], [place] [address]  Comprehensive metabolic panel     Status: Abnormal   Collection Time: 03/15/22  5:48 PM  Result Value Ref Range   Sodium 136 135 - 145 mmol/L   Potassium 3.8 3.5 - 5.1 mmol/L   Chloride 106 98 - 111 mmol/L   CO2 19 (L) 22 - 32 mmol/L   Glucose, Bld 109 (H) 70 - 99 mg/dL    Comment: Glucose reference  range applies only to samples taken after fasting for at least 8 hours.   BUN 5 (L) 6 - 20 mg/dL   Creatinine, Ser 0.63 0.44 - 1.00 mg/dL   Calcium 8.9 8.9 - 10.3 mg/dL   Total Protein 6.6 6.5 - 8.1 g/dL   Albumin 3.0 (L) 3.5 - 5.0 g/dL   AST 16 15 - 41 U/L   ALT 13 0 - 44 U/L   Alkaline Phosphatase 81 38 - 126 U/L   Total Bilirubin 0.1 (L) 0.3 - 1.2 mg/dL   GFR, Estimated >60 >60 mL/min    Comment: (NOTE) Calculated using the CKD-EPI Creatinine Equation (2021)    Anion gap 11 5 - 15    Comment: Performed at [facility], [place] [address], [place], [place] [address]  Protein / creatinine ratio, urine     Status: None   Collection Time: 03/15/22  6:08 PM  Result Value Ref Range   Creatinine, Urine 172 mg/dL   Total Protein, Urine 22 mg/dL    Comment: NO NORMAL RANGE ESTABLISHED FOR THIS TEST   Protein Creatinine Ratio 0.13 0.00 - 0.15 mg/mg[Cre]    Comment: Performed at [facility] [address] [place], [place] [address]  Urinalysis, Routine w reflex microscopic -Urine, Clean Catch     Status: Abnormal   Collection Time: 03/15/22  6:08 PM  Result Value Ref Range   Color, Urine YELLOW YELLOW   APPearance CLEAR CLEAR   Specific Gravity, Urine 1.023 1.005 - 1.030   pH 6.0 5.0 - 8.0   Glucose, UA NEGATIVE NEGATIVE mg/dL   Hgb urine dipstick NEGATIVE NEGATIVE   Bilirubin Urine NEGATIVE NEGATIVE   Ketones, ur NEGATIVE NEGATIVE mg/dL   Protein, ur 30 (A) NEGATIVE mg/dL   Nitrite NEGATIVE NEGATIVE   Leukocytes,Ua NEGATIVE NEGATIVE   RBC / HPF 0-5 0 - 5 RBC/hpf   WBC, UA 0-5 0 - 5 WBC/hpf   Bacteria, UA NONE SEEN NONE SEEN   Squamous Epithelial / HPF 0-5 0 - 5 /HPF   Mucus PRESENT     Comment: Performed at [facility], [place] [address], [place], [place] [address]  Type and screen [place]     Status: None (Preliminary result)   Collection Time: 04/12/22 11:00 AM  Result Value Ref Range   ABO/RH(D) B POS    Antibody Screen POS    Sample  Expiration 04/15/2022,2359    Antibody Identification      ANTI M Performed at [facility], [place] [address], [place],  [address]    Unit Number [id number]    Blood Component Type RED CELLS,LR    Unit division 00    Status of Unit REL FROM [facility]    Transfusion Status DO NOT ISSUE FOR TRANSFUSION    Crossmatch Result INCOMPATIBLE    Unit Number [id number]    Blood Component Type RED CELLS,LR    Unit division 00    Status of Unit ALLOCATED    Transfusion Status OK TO TRANSFUSE    Crossmatch Result COMPATIBLE    Unit Number [id number]    Blood Component Type RED CELLS,LR    Unit division 00    Status of Unit ALLOCATED    Transfusion Status OK TO TRANSFUSE    Crossmatch Result COMPATIBLE    Unit Number [id number]    Blood Component Type RED CELLS,LR    Unit division 00    Status of Unit REL FROM [facility]    Transfusion Status DO NOT ISSUE FOR TRANSFUSION    Crossmatch Result INCOMPATIBLE   BPAM RBC     Status: None (Preliminary result)   Collection Time: 04/12/22 11:00 AM  Result Value Ref Range   Blood Product Unit Number [id number]    PRODUCT CODE [id number]    Unit Type and Rh 7300    Blood Product Expiration Date LZ:9777218    Blood Product Unit Number [id number]    PRODUCT CODE [id number]    Unit Type and Rh 7300    Blood Product Expiration Date LZ:9777218    Blood Product Unit Number [id number]    PRODUCT CODE [id number]    Unit Type and Rh 7300    Blood Product Expiration  Date [id number]    Blood Product Unit Number [id number]    PRODUCT CODE [id number]    Unit Type and Rh 7300    Blood Product Expiration Date LZ:9777218   CBC with Differential     Status: Abnormal   Collection Time: 04/12/22 11:01 AM  Result Value Ref Range   WBC 8.3 4.0 - 10.5 K/uL   RBC 4.30 3.87 - 5.11 MIL/uL   Hemoglobin 12.2 12.0 - 15.0 g/dL   HCT 33.5 (L) 36.0 - 46.0 %   MCV 77.9 (L) 80.0 - 100.0 fL   MCH 28.4 26.0 - 34.0 pg   MCHC 36.4 (H) 30.0 -  36.0 g/dL   RDW 14.5 11.5 - 15.5 %   Platelets 325 150 - 400 K/uL   nRBC 0.0 0.0 - 0.2 %   Neutrophils Relative % 65 %   Neutro Abs 5.5 1.7 - 7.7 K/uL   Lymphocytes Relative 26 %   Lymphs Abs 2.2 0.7 - 4.0 K/uL   Monocytes Relative 7 %   Monocytes Absolute 0.5 0.1 - 1.0 K/uL   Eosinophils Relative 1 %   Eosinophils Absolute 0.1 0.0 - 0.5 K/uL   Basophils Relative 0 %   Basophils Absolute 0.0 0.0 - 0.1 K/uL   Immature Granulocytes 1 %   Abs Immature Granulocytes 0.04 0.00 - 0.07 K/uL    Comment: Performed at [facility], [address] [place], [place] [address]  RPR     Status: None   Collection Time: 04/12/22 11:01 AM  Result Value Ref Range   RPR Ser Ql NON REACTIVE NON REACTIVE    Comment: Performed at [facility] [address] [place], [place] [address]     Assessment/Plan: 32 y/o G3P1011 at [redacted] week EGA here for a repeat Cesarean section for gestational HTN, HIV positive, recent viral load was undetectable, - Admit to [place] and Delivery OR. - NPO and IV fluids. - This procedure has been fully reviewed with the patient and written informed consent has been obtained.  Discussed with patient risks, benefits and alternatives of cesarean section including but not limited to risks of infection, damage to organs, heavy bleeding and possible placenta problems in future pregnancies.      Archie Endo, MD.  04/12/2022, 10:08 PM

## 2022-04-13 ENCOUNTER — Inpatient Hospital Stay (HOSPITAL_COMMUNITY): Payer: 59 | Admitting: Anesthesiology

## 2022-04-13 ENCOUNTER — Inpatient Hospital Stay (HOSPITAL_COMMUNITY)
Admission: RE | Admit: 2022-04-13 | Discharge: 2022-04-17 | DRG: 786 | Disposition: A | Discharge status: Home / Self Care | Payer: 59 | Attending: Obstetrics & Gynecology | Admitting: Obstetrics & Gynecology

## 2022-04-13 ENCOUNTER — Other Ambulatory Visit: Payer: Self-pay

## 2022-04-13 ENCOUNTER — Encounter (HOSPITAL_COMMUNITY)
Admission: RE | Disposition: A | Discharge status: Home / Self Care | Payer: Self-pay | Source: Home / Self Care | Attending: Obstetrics & Gynecology

## 2022-04-13 ENCOUNTER — Encounter (HOSPITAL_COMMUNITY): Payer: Self-pay | Admitting: Obstetrics & Gynecology

## 2022-04-13 ENCOUNTER — Other Ambulatory Visit (HOSPITAL_COMMUNITY): Payer: Self-pay

## 2022-04-13 DIAGNOSIS — D259 Leiomyoma of uterus, unspecified: Secondary | ICD-10-CM | POA: Diagnosis present

## 2022-04-13 DIAGNOSIS — O99214 Obesity complicating childbirth: Secondary | ICD-10-CM | POA: Diagnosis present

## 2022-04-13 DIAGNOSIS — E119 Type 2 diabetes mellitus without complications: Secondary | ICD-10-CM | POA: Diagnosis present

## 2022-04-13 DIAGNOSIS — O9872 Human immunodeficiency virus [HIV] disease complicating childbirth: Secondary | ICD-10-CM

## 2022-04-13 DIAGNOSIS — A6000 Herpesviral infection of urogenital system, unspecified: Secondary | ICD-10-CM | POA: Diagnosis present

## 2022-04-13 DIAGNOSIS — O34211 Maternal care for low transverse scar from previous cesarean delivery: Principal | ICD-10-CM | POA: Diagnosis present

## 2022-04-13 DIAGNOSIS — O34219 Maternal care for unspecified type scar from previous cesarean delivery: Secondary | ICD-10-CM

## 2022-04-13 DIAGNOSIS — Z3A37 37 weeks gestation of pregnancy: Secondary | ICD-10-CM

## 2022-04-13 DIAGNOSIS — O2412 Pre-existing diabetes mellitus, type 2, in childbirth: Secondary | ICD-10-CM | POA: Diagnosis present

## 2022-04-13 DIAGNOSIS — Z87410 Personal history of cervical dysplasia: Secondary | ICD-10-CM

## 2022-04-13 DIAGNOSIS — O9832 Other infections with a predominantly sexual mode of transmission complicating childbirth: Secondary | ICD-10-CM | POA: Diagnosis present

## 2022-04-13 DIAGNOSIS — Z98891 History of uterine scar from previous surgery: Principal | ICD-10-CM

## 2022-04-13 DIAGNOSIS — Z21 Asymptomatic human immunodeficiency virus [HIV] infection status: Secondary | ICD-10-CM | POA: Diagnosis present

## 2022-04-13 DIAGNOSIS — O1414 Severe pre-eclampsia complicating childbirth: Secondary | ICD-10-CM | POA: Diagnosis present

## 2022-04-13 DIAGNOSIS — O1413 Severe pre-eclampsia, third trimester: Secondary | ICD-10-CM | POA: Diagnosis present

## 2022-04-13 DIAGNOSIS — O1490 Unspecified pre-eclampsia, unspecified trimester: Secondary | ICD-10-CM | POA: Diagnosis present

## 2022-04-13 DIAGNOSIS — Z79899 Other long term (current) drug therapy: Secondary | ICD-10-CM

## 2022-04-13 DIAGNOSIS — O3413 Maternal care for benign tumor of corpus uteri, third trimester: Secondary | ICD-10-CM | POA: Diagnosis present

## 2022-04-13 DIAGNOSIS — O99824 Streptococcus B carrier state complicating childbirth: Secondary | ICD-10-CM | POA: Diagnosis present

## 2022-04-13 DIAGNOSIS — O134 Gestational [pregnancy-induced] hypertension without significant proteinuria, complicating childbirth: Secondary | ICD-10-CM

## 2022-04-13 DIAGNOSIS — O1404 Mild to moderate pre-eclampsia, complicating childbirth: Secondary | ICD-10-CM | POA: Diagnosis not present

## 2022-04-13 DIAGNOSIS — Z794 Long term (current) use of insulin: Secondary | ICD-10-CM | POA: Diagnosis not present

## 2022-04-13 LAB — COMPREHENSIVE METABOLIC PANEL
ALT: 9 U/L (ref 0–44)
AST: 14 U/L — ABNORMAL LOW (ref 15–41)
Albumin: 2.4 g/dL — ABNORMAL LOW (ref 3.5–5.0)
Alkaline Phosphatase: 120 U/L (ref 38–126)
Anion gap: 8 (ref 5–15)
BUN: <5 mg/dL — ABNORMAL LOW (ref 6–20)
CO2: 24 mmol/L (ref 22–32)
Calcium: 8.3 mg/dL — ABNORMAL LOW (ref 8.9–10.3)
Chloride: 105 mmol/L (ref 98–111)
Creatinine, Ser: 0.67 mg/dL (ref 0.44–1.00)
GFR, Estimated: >60 mL/min (ref >60)
Glucose, Bld: 155 mg/dL — ABNORMAL HIGH (ref 70–99)
Potassium: 3.8 mmol/L (ref 3.5–5.1)
Sodium: 137 mmol/L (ref 135–145)
Total Bilirubin: 0.3 mg/dL (ref 0.3–1.2)
Total Protein: 5.8 g/dL — ABNORMAL LOW (ref 6.5–8.1)

## 2022-04-13 LAB — PROTEIN / CREATININE RATIO, URINE
Creatinine, Urine: 171 mg/dL
Protein Creatinine Ratio: 0.36 mg/mg{Cre} — ABNORMAL HIGH (ref 0.00–0.15)
Total Protein, Urine: 61 mg/dL

## 2022-04-13 LAB — GLUCOSE, CAPILLARY
Glucose-Capillary: 135 mg/dL — ABNORMAL HIGH (ref 70–99)
Glucose-Capillary: 137 mg/dL — ABNORMAL HIGH (ref 70–99)
Glucose-Capillary: 142 mg/dL — ABNORMAL HIGH (ref 70–99)
Glucose-Capillary: 145 mg/dL — ABNORMAL HIGH (ref 70–99)
Glucose-Capillary: 203 mg/dL — ABNORMAL HIGH (ref 70–99)

## 2022-04-13 SURGERY — Surgical Case
Anesthesia: Spinal | Site: Abdomen

## 2022-04-13 MED ORDER — CEFAZOLIN SODIUM-DEXTROSE 2-4 GM/100ML-% IV SOLN
INTRAVENOUS | Status: AC
  Filled 2022-04-13: qty 100

## 2022-04-13 MED ORDER — SOD CITRATE-CITRIC ACID 500-334 MG/5ML PO SOLN: 30.0000 mL | ORAL | Status: DC

## 2022-04-13 MED ORDER — ENOXAPARIN SODIUM 60 MG/0.6ML IJ SOSY
50.0000 mg | PREFILLED_SYRINGE | INTRAMUSCULAR | Status: DC
  Administered 2022-04-14 – 2022-04-17 (×4): 50 mg via SUBCUTANEOUS
  Filled 2022-04-13 (×4): qty 0.6

## 2022-04-13 MED ORDER — KETOROLAC TROMETHAMINE 30 MG/ML IJ SOLN: 30.0000 mg | Freq: Four times a day (QID) | INTRAMUSCULAR | Status: DC | PRN

## 2022-04-13 MED ORDER — INSULIN ASPART 100 UNIT/ML IJ SOLN: 0.0000 [IU] | Freq: Every day | INTRAMUSCULAR | Status: DC

## 2022-04-13 MED ORDER — SENNOSIDES-DOCUSATE SODIUM 8.6-50 MG PO TABS
2.0000 | ORAL_TABLET | Freq: Every day | ORAL | Status: DC
  Administered 2022-04-14 – 2022-04-16 (×3): 2 via ORAL
  Filled 2022-04-13 (×3): qty 2

## 2022-04-13 MED ORDER — ONDANSETRON HCL 4 MG/2ML IJ SOLN
INTRAMUSCULAR | Status: DC | PRN
  Administered 2022-04-13: 4 mg via INTRAVENOUS

## 2022-04-13 MED ORDER — OXYTOCIN-SODIUM CHLORIDE 30-0.9 UT/500ML-% IV SOLN: 2.5000 [IU]/h | INTRAVENOUS | Status: AC

## 2022-04-13 MED ORDER — DIPHENHYDRAMINE HCL 25 MG PO CAPS
25.0000 mg | ORAL_CAPSULE | Freq: Four times a day (QID) | ORAL | Status: DC | PRN
  Administered 2022-04-13: 25 mg via ORAL
  Filled 2022-04-13: qty 1

## 2022-04-13 MED ORDER — LACTATED RINGERS IV SOLN: INTRAVENOUS | Status: DC | PRN

## 2022-04-13 MED ORDER — FENTANYL CITRATE (PF) 100 MCG/2ML IJ SOLN
INTRAMUSCULAR | Status: AC
  Filled 2022-04-13: qty 2

## 2022-04-13 MED ORDER — SIMETHICONE 80 MG PO CHEW
80.0000 mg | CHEWABLE_TABLET | Freq: Three times a day (TID) | ORAL | Status: DC
  Administered 2022-04-14 – 2022-04-16 (×9): 80 mg via ORAL
  Filled 2022-04-13 (×11): qty 1

## 2022-04-13 MED ORDER — NIFEDIPINE ER OSMOTIC RELEASE 30 MG PO TB24
30.0000 mg | ORAL_TABLET | Freq: Every day | ORAL | Status: DC
  Administered 2022-04-13 – 2022-04-16 (×4): 30 mg via ORAL
  Filled 2022-04-13 (×4): qty 1

## 2022-04-13 MED ORDER — CEFAZOLIN SODIUM-DEXTROSE 2-3 GM-%(50ML) IV SOLR
INTRAVENOUS | Status: DC | PRN
  Administered 2022-04-13: 2 g via INTRAVENOUS

## 2022-04-13 MED ORDER — ZOLPIDEM TARTRATE 5 MG PO TABS: 5.0000 mg | ORAL_TABLET | Freq: Every evening | ORAL | Status: DC | PRN

## 2022-04-13 MED ORDER — KETOROLAC TROMETHAMINE 30 MG/ML IJ SOLN
INTRAMUSCULAR | Status: AC
  Filled 2022-04-13: qty 1

## 2022-04-13 MED ORDER — EMTRICITABINE-TENOFOVIR AF 200-25 MG PO TABS
1.0000 | ORAL_TABLET | Freq: Every day | ORAL | Status: DC
  Administered 2022-04-13 – 2022-04-17 (×5): 1 via ORAL
  Filled 2022-04-13 (×5): qty 1

## 2022-04-13 MED ORDER — MENTHOL 3 MG MT LOZG: 1.0000 | LOZENGE | OROMUCOSAL | Status: DC | PRN

## 2022-04-13 MED ORDER — ACETAMINOPHEN 500 MG PO TABS: 1000.0000 mg | ORAL_TABLET | Freq: Four times a day (QID) | ORAL | Status: DC

## 2022-04-13 MED ORDER — DROPERIDOL 2.5 MG/ML IJ SOLN
INTRAMUSCULAR | Status: AC
  Filled 2022-04-13: qty 2

## 2022-04-13 MED ORDER — LACTATED RINGERS IV SOLN: INTRAVENOUS | Status: DC

## 2022-04-13 MED ORDER — SODIUM CHLORIDE 0.9 % IR SOLN
Status: DC | PRN
  Administered 2022-04-13: 1000 mL

## 2022-04-13 MED ORDER — PRENATAL MULTIVITAMIN CH
1.0000 | ORAL_TABLET | Freq: Every day | ORAL | Status: DC
  Administered 2022-04-14 – 2022-04-17 (×4): 1 via ORAL
  Filled 2022-04-13 (×3): qty 1

## 2022-04-13 MED ORDER — OXYTOCIN-SODIUM CHLORIDE 30-0.9 UT/500ML-% IV SOLN
INTRAVENOUS | Status: DC | PRN
  Administered 2022-04-13: 30 [IU] via INTRAVENOUS

## 2022-04-13 MED ORDER — NALOXONE HCL 4 MG/10ML IJ SOLN: 1.0000 ug/kg/h | INTRAVENOUS | Status: DC | PRN

## 2022-04-13 MED ORDER — ACETAMINOPHEN 160 MG/5ML PO SOLN: 1000.0000 mg | Freq: Once | ORAL | Status: DC

## 2022-04-13 MED ORDER — DOLUTEGRAVIR SODIUM 50 MG PO TABS
50.0000 mg | ORAL_TABLET | Freq: Every day | ORAL | Status: DC
  Administered 2022-04-13 – 2022-04-17 (×5): 50 mg via ORAL
  Filled 2022-04-13 (×5): qty 1

## 2022-04-13 MED ORDER — PHENYLEPHRINE HCL-NACL 20-0.9 MG/250ML-% IV SOLN
INTRAVENOUS | Status: DC | PRN
  Administered 2022-04-13: 60 ug/min via INTRAVENOUS

## 2022-04-13 MED ORDER — INSULIN ASPART 100 UNIT/ML IJ SOLN
0.0000 [IU] | Freq: Three times a day (TID) | INTRAMUSCULAR | Status: DC
  Administered 2022-04-13: 3 [IU] via SUBCUTANEOUS
  Administered 2022-04-13: 7 [IU] via SUBCUTANEOUS
  Administered 2022-04-14: 3 [IU] via SUBCUTANEOUS
  Administered 2022-04-14: 7 [IU] via SUBCUTANEOUS
  Administered 2022-04-15: 3 [IU] via SUBCUTANEOUS
  Administered 2022-04-15: 4 [IU] via SUBCUTANEOUS
  Administered 2022-04-16: 3 [IU] via SUBCUTANEOUS
  Administered 2022-04-16: 4 [IU] via SUBCUTANEOUS
  Administered 2022-04-17: 3 [IU] via SUBCUTANEOUS

## 2022-04-13 MED ORDER — ACETAMINOPHEN 500 MG PO TABS
1000.0000 mg | ORAL_TABLET | Freq: Four times a day (QID) | ORAL | Status: DC
  Administered 2022-04-13 – 2022-04-17 (×15): 1000 mg via ORAL
  Filled 2022-04-13 (×16): qty 2

## 2022-04-13 MED ORDER — METFORMIN HCL 500 MG PO TABS
500.0000 mg | ORAL_TABLET | Freq: Two times a day (BID) | ORAL | Status: DC
  Administered 2022-04-14: 500 mg via ORAL
  Filled 2022-04-13: qty 1

## 2022-04-13 MED ORDER — COCONUT OIL OIL: 1.0000 | TOPICAL_OIL | Status: DC | PRN

## 2022-04-13 MED ORDER — CEFAZOLIN SODIUM-DEXTROSE 2-4 GM/100ML-% IV SOLN: 2.0000 g | INTRAVENOUS | Status: DC

## 2022-04-13 MED ORDER — SOD CITRATE-CITRIC ACID 500-334 MG/5ML PO SOLN
ORAL | Status: AC
  Filled 2022-04-13: qty 30

## 2022-04-13 MED ORDER — ONDANSETRON HCL 4 MG/2ML IJ SOLN
4.0000 mg | Freq: Three times a day (TID) | INTRAMUSCULAR | Status: DC | PRN
  Administered 2022-04-13: 4 mg via INTRAVENOUS
  Filled 2022-04-13: qty 2

## 2022-04-13 MED ORDER — FENTANYL CITRATE (PF) 100 MCG/2ML IJ SOLN
INTRAMUSCULAR | Status: DC | PRN
  Administered 2022-04-13: 15 ug via INTRATHECAL

## 2022-04-13 MED ORDER — BUPIVACAINE IN DEXTROSE 0.75-8.25 % IT SOLN
INTRATHECAL | Status: DC | PRN
  Administered 2022-04-13: 1.6 mL via INTRATHECAL

## 2022-04-13 MED ORDER — STERILE WATER FOR IRRIGATION IR SOLN
Status: DC | PRN
  Administered 2022-04-13: 1000 mL

## 2022-04-13 MED ORDER — KETOROLAC TROMETHAMINE 30 MG/ML IJ SOLN
30.0000 mg | Freq: Four times a day (QID) | INTRAMUSCULAR | Status: AC
  Administered 2022-04-13 – 2022-04-14 (×3): 30 mg via INTRAVENOUS
  Filled 2022-04-13 (×3): qty 1

## 2022-04-13 MED ORDER — MORPHINE SULFATE (PF) 0.5 MG/ML IJ SOLN
INTRAMUSCULAR | Status: AC
  Filled 2022-04-13: qty 10

## 2022-04-13 MED ORDER — KETOROLAC TROMETHAMINE 30 MG/ML IJ SOLN
30.0000 mg | Freq: Once | INTRAMUSCULAR | Status: AC
  Administered 2022-04-13: 30 mg via INTRAVENOUS

## 2022-04-13 MED ORDER — NALOXONE HCL 0.4 MG/ML IJ SOLN: 0.4000 mg | INTRAMUSCULAR | Status: DC | PRN

## 2022-04-13 MED ORDER — OXYCODONE HCL 5 MG PO TABS
5.0000 mg | ORAL_TABLET | ORAL | Status: DC | PRN
  Administered 2022-04-14 – 2022-04-17 (×13): 10 mg via ORAL
  Filled 2022-04-13 (×15): qty 2

## 2022-04-13 MED ORDER — SIMETHICONE 80 MG PO CHEW
80.0000 mg | CHEWABLE_TABLET | ORAL | Status: DC | PRN
  Administered 2022-04-14: 80 mg via ORAL
  Filled 2022-04-13: qty 1

## 2022-04-13 MED ORDER — DIBUCAINE (PERIANAL) 1 % EX OINT: 1.0000 | TOPICAL_OINTMENT | CUTANEOUS | Status: DC | PRN

## 2022-04-13 MED ORDER — DIPHENHYDRAMINE HCL 50 MG/ML IJ SOLN: 12.5000 mg | INTRAMUSCULAR | Status: DC | PRN

## 2022-04-13 MED ORDER — OXYTOCIN-SODIUM CHLORIDE 30-0.9 UT/500ML-% IV SOLN
INTRAVENOUS | Status: AC
  Filled 2022-04-13: qty 500

## 2022-04-13 MED ORDER — TRANEXAMIC ACID-NACL 1000-0.7 MG/100ML-% IV SOLN
INTRAVENOUS | Status: DC | PRN
  Administered 2022-04-13: 1000 mg via INTRAVENOUS

## 2022-04-13 MED ORDER — SODIUM CHLORIDE 0.9% FLUSH: 3.0000 mL | INTRAVENOUS | Status: DC | PRN

## 2022-04-13 MED ORDER — DEXAMETHASONE SODIUM PHOSPHATE 10 MG/ML IJ SOLN
INTRAMUSCULAR | Status: DC | PRN
  Administered 2022-04-13: 10 mg via INTRAVENOUS

## 2022-04-13 MED ORDER — MORPHINE SULFATE (PF) 0.5 MG/ML IJ SOLN
INTRAMUSCULAR | Status: DC | PRN
  Administered 2022-04-13: 150 ug via INTRATHECAL

## 2022-04-13 MED ORDER — SODIUM CHLORIDE 0.9 % IV SOLN: INTRAVENOUS | Status: DC | PRN

## 2022-04-13 MED ORDER — ACETAMINOPHEN 500 MG PO TABS: 1000.0000 mg | ORAL_TABLET | Freq: Once | ORAL | Status: DC

## 2022-04-13 MED ORDER — ACETAMINOPHEN 10 MG/ML IV SOLN
INTRAVENOUS | Status: DC | PRN
  Administered 2022-04-13: 1000 mg via INTRAVENOUS

## 2022-04-13 MED ORDER — DIPHENHYDRAMINE HCL 25 MG PO CAPS
25.0000 mg | ORAL_CAPSULE | ORAL | Status: DC | PRN
  Administered 2022-04-14 (×2): 25 mg via ORAL
  Filled 2022-04-13 (×2): qty 1

## 2022-04-13 MED ORDER — FENTANYL CITRATE (PF) 100 MCG/2ML IJ SOLN: 25.0000 ug | INTRAMUSCULAR | Status: DC | PRN

## 2022-04-13 MED ORDER — TRANEXAMIC ACID-NACL 1000-0.7 MG/100ML-% IV SOLN: 1000.0000 mg | INTRAVENOUS | Status: AC

## 2022-04-13 MED ORDER — WITCH HAZEL-GLYCERIN EX PADS: 1.0000 | MEDICATED_PAD | CUTANEOUS | Status: DC | PRN

## 2022-04-13 MED ORDER — SODIUM CHLORIDE 0.9 % IV SOLN: 500.0000 mg | INTRAVENOUS | Status: DC

## 2022-04-13 MED ORDER — HYDROMORPHONE HCL 1 MG/ML IJ SOLN: 0.2000 mg | INTRAMUSCULAR | Status: DC | PRN

## 2022-04-13 MED ORDER — IBUPROFEN 600 MG PO TABS
600.0000 mg | ORAL_TABLET | Freq: Four times a day (QID) | ORAL | Status: DC
  Administered 2022-04-14 – 2022-04-17 (×13): 600 mg via ORAL
  Filled 2022-04-13 (×13): qty 1

## 2022-04-13 MED ORDER — DROPERIDOL 2.5 MG/ML IJ SOLN
0.6250 mg | Freq: Once | INTRAMUSCULAR | Status: AC | PRN
  Administered 2022-04-13: 0.625 mg via INTRAVENOUS

## 2022-04-13 SURGICAL SUPPLY — 44 items
ADH SKN CLS APL DERMABOND .7 (GAUZE/BANDAGES/DRESSINGS)
APL PRP STRL LF DISP 70% ISPRP (MISCELLANEOUS) ×2
APL SKNCLS STERI-STRIP NONHPOA (GAUZE/BANDAGES/DRESSINGS) ×1
BENZOIN TINCTURE PRP APPL 2/3 (GAUZE/BANDAGES/DRESSINGS) IMPLANT
CHLORAPREP W/TINT 26 (MISCELLANEOUS) ×2 IMPLANT
CLAMP UMBILICAL CORD (MISCELLANEOUS) ×1 IMPLANT
CLOTH BEACON ORANGE TIMEOUT ST (SAFETY) ×1 IMPLANT
DERMABOND ADVANCED .7 DNX12 (GAUZE/BANDAGES/DRESSINGS) IMPLANT
DRAPE C SECTION CLR SCREEN (DRAPES) ×1 IMPLANT
DRSG OPSITE POSTOP 4X10 (GAUZE/BANDAGES/DRESSINGS) ×1 IMPLANT
ELECT REM PT RETURN 9FT ADLT (ELECTROSURGICAL) ×1
ELECTRODE REM PT RTRN 9FT ADLT (ELECTROSURGICAL) ×1 IMPLANT
EXTRACTOR VACUUM KIWI (MISCELLANEOUS) ×1 IMPLANT
GAUZE SPONGE 4X4 12PLY STRL LF (GAUZE/BANDAGES/DRESSINGS) IMPLANT
GLOVE BIOGEL PI IND STRL 7.0 (GLOVE) ×2 IMPLANT
GLOVE SURG SS PI 6.5 STRL IVOR (GLOVE) ×1 IMPLANT
GOWN STRL REUS W/TWL LRG LVL3 (GOWN DISPOSABLE) ×2 IMPLANT
KIT ABG SYR 3ML LUER SLIP (SYRINGE) IMPLANT
MAT PREVALON FULL STRYKER (MISCELLANEOUS) IMPLANT
NDL HYPO 25X5/8 SAFETYGLIDE (NEEDLE) IMPLANT
NDL KEITH (NEEDLE) ×1 IMPLANT
NEEDLE HYPO 25X5/8 SAFETYGLIDE (NEEDLE) IMPLANT
NEEDLE KEITH (NEEDLE) ×1 IMPLANT
NS IRRIG 1000ML POUR BTL (IV SOLUTION) ×1 IMPLANT
PACK C SECTION WH (CUSTOM PROCEDURE TRAY) ×1 IMPLANT
PAD OB MATERNITY 4.3X12.25 (PERSONAL CARE ITEMS) ×1 IMPLANT
RETRACTOR TRAXI PANNICULUS (MISCELLANEOUS) IMPLANT
RTRCTR C-SECT PINK 25CM LRG (MISCELLANEOUS) ×1 IMPLANT
STRIP CLOSURE SKIN 1/2X4 (GAUZE/BANDAGES/DRESSINGS) IMPLANT
SUT CHROMIC 1 CTX 36 (SUTURE) IMPLANT
SUT CHROMIC 2 0 CT 1 (SUTURE) ×1 IMPLANT
SUT MON AB 4-0 PS1 27 (SUTURE) IMPLANT
SUT PDS AB 0 CTX 60 (SUTURE) IMPLANT
SUT PLAIN 1 NONE 54 (SUTURE) IMPLANT
SUT PLAIN 2 0 (SUTURE) ×1
SUT PLAIN 2 0 XLH (SUTURE) IMPLANT
SUT PLAIN ABS 2-0 CT1 27XMFL (SUTURE) IMPLANT
SUT VIC AB 0 CTX 36 (SUTURE) ×1
SUT VIC AB 0 CTX36XBRD ANBCTRL (SUTURE) ×1 IMPLANT
SUT VIC AB 1 CTX 36 (SUTURE) ×2
SUT VIC AB 1 CTX36XBRD ANBCTRL (SUTURE) ×2 IMPLANT
TOWEL OR 17X24 6PK STRL BLUE (TOWEL DISPOSABLE) ×1 IMPLANT
TRAY FOLEY W/BAG SLVR 14FR LF (SET/KITS/TRAYS/PACK) ×1 IMPLANT
WATER STERILE IRR 1000ML POUR (IV SOLUTION) ×1 IMPLANT

## 2022-04-13 NOTE — Op Note (Addendum)
Patient: Kelly Hunter DOB: July 18, 1990 MRN:  [id number]  DATE OF SURGERY:  23/13/2024  PREOP DIAGNOSIS:  1. [redacted] week EGA IUP. 2.  History of 1 prior cesarean section and desiring a repeat cesarean delivery, she declines trial of labor and vaginal birth after a cesarean section. 3. Gestational Hypertension. 4. HIV positive with recent undetectable viral load. 5. BMI 42.   POSTOP DIAGNOSIS: Same as above.     PROCEDURE:  1. Repeat low uterine segment transverse cesarean section via Pfannenstiel incision.   SURGEON: Dr.  Waymon Amato.  ASSISTANT: Dr. Gifford Shave, OB Fellow.   SURGEON ATTESTATION: I was present and scrubbed for the entire case.  An experienced assistant was required given the standard of surgical care given the complexity of the case.  This assistant was needed for exposure, dissection, suctioning, retraction, instrument exchange,  assisting with delivery with administration of fundal pressure, and for overall help during the surgery.    ANESTHESIA: Spinal, Dr. Doloris Hall.   COMPLICATIONS: None  FINDINGS: Viable female infant in cephalic presentation, DOP, with body cord, weight 7lbs 4.1 oz, Apgar scores of 8 and 9. Uterus with 3cm fundal fibroid. Normal bilateral fallopian tubes.  Normal left and right ovary.    EBL:   348 cc.  IV FLUID:  2000 cc LR .  URINE OUTPUT: 150 cc clear urine.  INDICATIONS:  32 y/o P1 who presented for a repeat cesarean section at 37 weeks for gestational HTN, previous Cesarean section.    PROCEDURE:   Informed consent was obtained from the patient to undergo the procedure. She was taken to the operating room where her spinal anesthesia was found to be adequate. She was prepped and draped in the usual sterile fashion and a Foley catheter was placed as well as a Traxi pannus retractor. She received IV Ancef and Tranexamic acid. preoperatively. HIV prophylaxis was not administered preoperatively as patient had undetectable viral  loads. Patient has been on oral anti retrovirals.  A Pfannenstiel incision was made with the scalpel over the prior incision and the incision extended through the subcutaneous layer and also the fascia with the bovie. Small perforators in the subcutaneous layer were contained with the Bovie. The fascia was nicked in the midline and then was further separated from the rectus muscles bilaterally using Mayo scissors. Kochers were placed inferiorly and then superiorly to allow further separation of fascia from the rectus muscles.  The peritoneal cavity was entered bluntly with the fingers. The Alexis retractor was placed in. The bladder peritoneal fold was dissected off using Metzenbaum scissors.  The uterus was incised with a scalpel and the incision extended bluntly bilaterally with fingers and with bandage scissors.  Membranes were ruptured and moderate clear amniotic fluid was noted.  The head then the rest of the body was then delivered with abdominal pressure.  She delivered a viable female infant, apgar scores 8, 9.  The edges of the uterus was grasped with rIng forceps.  The cord was clamped and cut after 1 minute. Cord blood was collected.  The uterus was not exteriorized.  The placenta was delivered with gentle traction on the umbilical cord.  The uterus was cleared of clots and debris with a lap.  The uterine incision was closed with #1 Vicryl in a running locked stitch. An imbricating layer of the same stitch was placed over the initial closure.  Irrigation was applied and suctioned out. Excellent hemostasis was noted over the incision.  The peritoneum was closed off using  chromic suture.  Fascia was closed using looped 0 PDS in a running stitch. The subcutaneous layer was irrigated and suctioned out. Small perforators were contained with the bovie.  The subcutaneous layer was closed using 1-0 plain in interrupted stitches. The skin was closed using 4-0 Monocryl. Benzocaine and steri strips were applied.   Honeycomb was then applied. The patient was then cleaned and she was taken to the recovery room with her baby in stable conditions.   SPECIMEN: Placenta looked grossly normal and intact and was sent to Labor and Delivery.  Umbilical cord blood to lab.   DISPOSITION: TO PACU, STABLE.   Dr. Waymon Amato.    04/13/2022.

## 2022-04-13 NOTE — Inpatient Diabetes Management (Signed)
Inpatient Diabetes Program Recommendations  AACE/ADA: New Consensus Statement on Inpatient Glycemic Control (2015)  Target Ranges:  Prepandial:   less than 140 mg/dL      Peak postprandial:   less than 180 mg/dL (1-2 hours)      Critically ill patients:  140 - 180 mg/dL   Lab Results  Component Value Date   GLUCAP 145 (H) 04/13/2022   HGBA1C 6.3 (A) 12/06/2019    Review of Glycemic Control  Latest Reference Range & Units 04/13/22 06:12 04/13/22 09:33  Glucose-Capillary 70 - 99 mg/dL 137 (H) 145 (H)  (H): Data is abnormally high Diabetes history: Type 2 DM Outpatient Diabetes medications: NPH 8 units BID, Humalog 3-5 units TID Prior to Pregnancy: Metformin 500 mg BID, Glyburide 5 mg QHS (NT) Current orders for Inpatient glycemic control: none Decadron 10 mg x 1  Inpatient Diabetes Program Recommendations:    Anticipate with steroids and being type 2 DM, patient's blood sugars may be slightly elevated.  With that consider: -Novolog 0-9 units TID & HS  -Metformin 500 mg BID  Thanks, Bronson Curb, MSN, RNC-OB Diabetes Coordinator [phone] (8a-5p)

## 2022-04-13 NOTE — Progress Notes (Signed)
Dr. Charlesetta Garibaldi gave verbal order BP parameters of 160/105 or greater. If BP reaches either/or to call. Pt asymptomatic. Verbal order also given to change honeycomb dressing now.

## 2022-04-13 NOTE — Lactation Note (Signed)
This note was copied from a baby's chart. Lactation Consultation Note  Patient Name: Kelly Hunter [id number] Date: 04/13/2022 Age:30 hours  Mom chooses to formula feed baby.   Maternal Data    Feeding Nipple Type: Slow - flow  LATCH Score                    Lactation Tools Discussed/Used    Interventions    Discharge    Consult Status Consult Status: Complete    Adrienne Trombetta G 04/13/2022, 7:30 PM

## 2022-04-13 NOTE — Anesthesia Postprocedure Evaluation (Signed)
Anesthesia Post Note  Patient: Kelly Hunter  Procedure(s) Performed: CESAREAN SECTION (Abdomen)     Patient location during evaluation: PACU Anesthesia Type: Spinal Level of consciousness: awake and alert Pain management: pain level controlled Vital Signs Assessment: post-procedure vital signs reviewed and stable Respiratory status: spontaneous breathing, nonlabored ventilation and respiratory function stable Cardiovascular status: blood pressure returned to baseline Postop Assessment: no apparent nausea or vomiting, spinal receding, no headache and no backache Anesthetic complications: no   No notable events documented.  Last Vitals:  Vitals:   04/13/22 1030 04/13/22 1054  BP: (!) 140/96 (!) 134/94  Pulse: 93 96  Resp: 18 18  Temp:  36.9 C  SpO2:  95%    Last Pain:  Vitals:   04/13/22 1054  TempSrc: Oral  PainSc:    Pain Goal: Patients Stated Pain Goal: 4 (04/13/22 0930)  LLE Motor Response: Non-purposeful movement (04/13/22 1030) LLE Sensation: Tingling (04/13/22 1030)   RLE Sensation: Tingling (04/13/22 1030) L Sensory Level: L3-Anterior knee, lower leg (04/13/22 1030) R Sensory Level: L3-Anterior knee, lower leg (04/13/22 1030) Epidural/Spinal Function Cutaneous sensation: Able to Wiggle Toes (04/13/22 1030), Patient able to flex knees: Yes (04/13/22 1030), Patient able to lift hips off bed: Yes (04/13/22 1030), Back pain beyond tenderness at insertion site: No (04/13/22 1030), Progressively worsening motor and/or sensory loss: No (04/13/22 1030), Bowel and/or bladder incontinence post epidural: No (04/13/22 1030)  Marthenia Rolling

## 2022-04-13 NOTE — Anesthesia Procedure Notes (Signed)
Spinal  Patient location during procedure: OR Start time: 04/13/2022 7:43 AM End time: 04/13/2022 7:46 AM Reason for block: surgical anesthesia Staffing Performed: anesthesiologist  Anesthesiologist: Brennan Bailey, MD Performed by: Brennan Bailey, MD Authorized by: Brennan Bailey, MD   Preanesthetic Checklist Completed: patient identified, IV checked, risks and benefits discussed, monitors and equipment checked, pre-op evaluation and timeout performed Spinal Block Patient position: sitting Prep: DuraPrep and site prepped and draped Patient monitoring: heart rate, continuous pulse ox and blood pressure Approach: midline Location: L3-4 Injection technique: single-shot Needle Needle type: Pencan  Needle gauge: 24 G Needle length: 10 cm Assessment Sensory level: T4 Events: CSF return Additional Notes Risks, benefits, and alternative discussed. Patient gave consent to procedure. Prepped and draped in sitting position. Clear CSF obtained after one needle pass. Positive terminal aspiration. No pain or paraesthesias with injection. Patient tolerated procedure well. Vital signs stable. Tawny Asal, MD

## 2022-04-13 NOTE — Transfer of Care (Signed)
Immediate Anesthesia Transfer of Care Note  Patient: Kelly Hunter  Procedure(s) Performed: CESAREAN SECTION (Abdomen)  Patient Location: PACU  Anesthesia Type:Spinal  Level of Consciousness: awake, alert , and oriented  Airway & Oxygen Therapy: Patient Spontanous Breathing  Post-op Assessment: Report given to RN and Post -op Vital signs reviewed and stable  Post vital signs: Reviewed and stable  Last Vitals:  Vitals Value Taken Time  BP 150/89 04/13/22 0930  Temp    Pulse 87 04/13/22 0934  Resp 20 04/13/22 0934  SpO2 96 % 04/13/22 0934  Vitals shown include unvalidated device data.  Last Pain:  Vitals:   04/13/22 0634  TempSrc:   PainSc: 0-No pain         Complications: No notable events documented.

## 2022-04-13 NOTE — Interval H&P Note (Signed)
History and Physical Interval Note:  04/13/2022 7:34 AM  Kelly Hunter  has presented today for surgery, with the diagnosis of Previous, Gestational HTN, HIV positive with undetectable vital load, [redacted] weeks EGA.  The various methods of treatment have been discussed with the patient and family. After consideration of risks, benefits and other options for treatment, the patient has consented to  Procedure(s): CESAREAN SECTION (N/A) as a surgical intervention.  The patient's history has been reviewed, patient examined, no change in status, stable for surgery.  I have reviewed the patient's chart and labs.  Questions were answered to the patient's satisfaction.    Archie Endo, MD.

## 2022-04-14 ENCOUNTER — Encounter (HOSPITAL_COMMUNITY): Payer: Self-pay | Admitting: Obstetrics & Gynecology

## 2022-04-14 LAB — CBC
HCT: 30 % — ABNORMAL LOW (ref 36.0–46.0)
Hemoglobin: 10.5 g/dL — ABNORMAL LOW (ref 12.0–15.0)
MCH: 28.1 pg (ref 26.0–34.0)
MCHC: 35 g/dL (ref 30.0–36.0)
MCV: 80.2 fL (ref 80.0–100.0)
Platelets: 336 K/uL (ref 150–400)
RBC: 3.74 MIL/uL — ABNORMAL LOW (ref 3.87–5.11)
RDW: 14.4 % (ref 11.5–15.5)
WBC: 14.9 K/uL — ABNORMAL HIGH (ref 4.0–10.5)
nRBC: 0 % (ref 0.0–0.2)

## 2022-04-14 LAB — GLUCOSE, CAPILLARY
Glucose-Capillary: 112 mg/dL — ABNORMAL HIGH (ref 70–99)
Glucose-Capillary: 117 mg/dL — ABNORMAL HIGH (ref 70–99)
Glucose-Capillary: 134 mg/dL — ABNORMAL HIGH (ref 70–99)
Glucose-Capillary: 220 mg/dL — ABNORMAL HIGH (ref 70–99)

## 2022-04-14 MED ORDER — POLYETHYLENE GLYCOL 3350 17 G PO PACK
17.0000 g | PACK | Freq: Every day | ORAL | Status: DC
  Administered 2022-04-14 – 2022-04-16 (×3): 17 g via ORAL
  Filled 2022-04-14 (×3): qty 1

## 2022-04-14 MED ORDER — METFORMIN HCL 500 MG PO TABS
1000.0000 mg | ORAL_TABLET | Freq: Two times a day (BID) | ORAL | Status: DC
  Administered 2022-04-14 – 2022-04-17 (×6): 1000 mg via ORAL
  Filled 2022-04-14 (×6): qty 2

## 2022-04-14 NOTE — Progress Notes (Signed)
Subjective: Postpartum Day 1: Cesarean Delivery Patient reports tolerating PO, + flatus, and no problems voiding.    Objective: Vital signs in last 24 hours: Temp:  [97.8 F (36.6 C)-98.6 F (37 C)] 98.4 F (36.9 C) (03/14 0743) Pulse Rate:  [78-99] 99 (03/14 1024) Resp:  [18] 18 (03/14 0743) BP: (114-146)/(90-100) 114/91 (03/14 1024) SpO2:  [97 %-100 %] 100 % (03/14 0743)  Physical Exam:  General: alert and no distress Lochia: appropriate Uterine Fundus: FF, NT Incision: dressing c/d/i DVT Evaluation: No evidence of DVT seen on physical exam.  Recent Labs    04/12/22 1101 04/14/22 0257  HGB 12.2 10.5*  HCT 33.5* 30.0*    Assessment/Plan: Status post Cesarean section. Doing well postoperatively.  T2DM - CBGs increased.  Increase metformin to '1000mg'$  BID. BP under control on no meds Undetectable VL  Delice Lesch, MD 04/14/2022, 11:25 AM

## 2022-04-14 NOTE — Clinical Social Work Maternal (Signed)
CLINICAL SOCIAL WORK MATERNAL/CHILD NOTE  Patient Details  Name: VAUDINE PRAIRIE MRN: [id number] Date of Birth: 1990/06/28  Date:  04/14/2022  Clinical Social Worker Initiating Note:  Abundio Miu, [place] Date/Time: Initiated:  04/14/22/1404     Child's Name:  Samantha Crimes   Biological Parents:  Mother, Father (Father: Candace Cruise 01/20/88)   Need for Interpreter:  None   Reason for Referral:  Behavioral Health Concerns, Other (Comment) (Infant's NICU Admission; Speciality Care follow up)   Address:  [place] [address]    Phone number:  [phone] (home)     Additional phone number:   Household Members/Support Persons (HM/SP):   Household Member/Support Person 1   HM/SP Name Relationship DOB or Age  HM/SP -1 [name] son 06/18/20  HM/SP -2        HM/SP -3        HM/SP -4        HM/SP -5        HM/SP -6        HM/SP -7        HM/SP -8          Natural Supports (not living in the home):  Immediate Family, Parent, Extended Family, Friends   Chiropodist: None   Employment: Animator   Type of Work: Engineer, manufacturing   Education:  [place] arranged:    Museum/gallery curator Resources:  [employer], Multimedia programmer    Other Resources:  [facility]   Cultural/Religious Considerations Which May Impact Care:    Strengths:  Ability to meet basic needs  , Engineer, materials, Home prepared for child  , Understanding of illness   Psychotropic Medications:         Pediatrician:    [place]  Pediatrician List:   [place] [facility] [place] [facility] (Peds)  [place]      Pediatrician Fax Number:    Risk Factors/Current Problems:  Mental Health Concerns  , Transportation     Cognitive State:  Able to Concentrate  , Alert  , Linear Thinking  , Insightful  , Goal Oriented     Mood/Affect:  Calm  , Comfortable  , Happy   , Interested     CSW Assessment: CSW met with MOB at bedside to complete psychosocial assessment. CSW introduced self and explained role/consult reason. MOB was welcoming, open, talkative, pleasant, and remained engaged during assessment. MOB reported that she resides with her son. 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 MOB reported that she receives [facility] and verbalized a plan to apply for food stamps. MOB reported that she has all items needed to care for infant including a car seat and crib. CSW inquired about MOB's support system, MOB reported that her friends and family are supports; including her dad, cousin, aunts, and uncles.   CSW inquired about MOB's mental health history. MOB reported that she was diagnosed with anxiety and depression around 2018 or 2019. MOB shared that she has experienced depressive symptoms during her pregnancy. MOB described her depression as having down moments where her mood is decreased and isolating from others. MOB reported that she has also been misdiagnosed in 2019 with Borderline Personality Disorder due to her reports of being moody. MOB reported that she does not  feel that this diagnosis is accurate. MOB endorsed experiencing postpartum depression after having her son. MOB described her postpartum depression as never leaving the house and having moments where she would break down. MOB attributed her postpartum depression to having a lack of support. CSW inquired about MOB's coping skills.  MOB reported that she is not currently taking any medication nor participating in therapy at this time. MOB shared that she was going to [place] but due to transportation issues she had to stop going. MOB verbalized a plan to go back to [place] for services. CSW and MOB discussed virtual therapy as an option if MOB encounters transportation barriers in the future. CSW asked if MOB needed any additional  mental health resources, MOB reported no. CSW and MOB discussed additional coping skills to be proactive as MOB experienced postpartum depression after her previous pregnancy. MOB shared that she plans to be intentional about reaching out and going out more this time. MOB verbalized a plan to restart therapy and shared that speaking with close family and friends has been helpful. MOB also shared about trying to find a new hobby. CSW positively affirmed MOB's plan to engage in healthy coping skills.   CSW inquired about how MOB was feeling emotionally since giving birth, MOB reported that she was feeling better, just a little sad that infant is in the NICU. CSW acknowledged, normalized, and validated MOB's feelings. MOB presented calm and possessed insight about her mental health history. CSW assessed for safety, MOB denied SI,HI, and domestic violence.   CSW provided education regarding the baby blues period vs. perinatal mood disorders, discussed treatment and gave resources for mental health follow up if concerns arise.  CSW recommends self-evaluation during the postpartum time period using the New Mom Checklist from Postpartum Progress and encouraged MOB to contact a medical professional if symptoms are noted at any time.    CSW provided review of Sudden Infant Death Syndrome (SIDS) precautions.    CSW and MOB discussed infant's NICU admission. CSW informed MOB about the NICU, what to expect, and supports available while infant is admitted to the NICU. MOB reported that she feels well informed about infant's care. MOB denied any transportation barriers with visiting infant in the NICU. MOB denied any questions/concerns regarding the NICU.   CSW inquired about MOB's selection for pediatric infectious disease follow up. MOB selected [facility] [phone]). CSW agreed to complete referral for infant's follow up appointment. CSW inquired about  how MOB is managing her diagnosis, MOB reported that she is managing well and denied needing any resources.   CSW completed referral to [facility] [phone]). Infant has a follow up appointment scheduled for 05/04/22 at 9am with Dr. Rosendo Gros. CSW will update MOB.   CSW identifies no further need for intervention and no barriers to discharge at this time. MOB opted to contact CSW if any needs/concerns arise versus CSW checking in weekly.   CSW Plan/Description:  No Further Intervention Required/No Barriers to Discharge, Sudden Infant Death Syndrome (SIDS) Education, Perinatal Mood and Anxiety Disorder (PMADs) Education, Other Information/Referral to [employer], LCSW 04/14/2022, 2:12 PM

## 2022-04-15 ENCOUNTER — Encounter (HOSPITAL_COMMUNITY): Payer: Self-pay | Admitting: Obstetrics & Gynecology

## 2022-04-15 DIAGNOSIS — O1413 Severe pre-eclampsia, third trimester: Secondary | ICD-10-CM | POA: Diagnosis present

## 2022-04-15 LAB — GLUCOSE, CAPILLARY
Glucose-Capillary: 132 mg/dL — ABNORMAL HIGH (ref 70–99)
Glucose-Capillary: 112 mg/dL — ABNORMAL HIGH (ref 70–99)
Glucose-Capillary: 171 mg/dL — ABNORMAL HIGH (ref 70–99)
Glucose-Capillary: 127 mg/dL — ABNORMAL HIGH (ref 70–99)
Glucose-Capillary: 135 mg/dL — ABNORMAL HIGH (ref 70–99)

## 2022-04-15 MED ORDER — NIFEDIPINE ER OSMOTIC RELEASE 30 MG PO TB24
30.0000 mg | ORAL_TABLET | Freq: Once | ORAL | Status: AC
  Administered 2022-04-15: 30 mg via ORAL
  Filled 2022-04-15: qty 1

## 2022-04-15 NOTE — Progress Notes (Signed)
Subjective: POD# 2 Information for the patient's newborn:  Sherlean, Salasar [id number]  female   Reports feeling "sore, but overall ok" Feeding: formula Reports tolerating PO and denies N/V, foley removed, ambulating and urinating w/o difficulty  Pain controlled with  PO meds Denies HA/SOB/dizziness  Flatus passing Vaginal bleeding is normal, no clots     Objective:  VS:  Vitals:   04/14/22 1024 04/14/22 1347 04/14/22 2050 04/15/22 0541  BP: (!) 114/91 119/79 (!) 129/90 131/86  Pulse: 99 89 98 94  Resp:  16 16 16   Temp:  99.2 F (37.3 C) 98.7 F (37.1 C) 98.3 F (36.8 C)  TempSrc:  Oral Oral   SpO2:  98%  98%  Weight:      Height:        Intake/Output Summary (Last 24 hours) at 04/15/2022 1125 Last data filed at 04/15/2022 0300 Gross per 24 hour  Intake 0 ml  Output --  Net 0 ml     Recent Labs    04/14/22 0257  WBC 14.9*  HGB 10.5*  HCT 30.0*  PLT 336    Blood type: --/--/B POS (03/12 1100) Rubella: Immune (09/26 0000)    Physical Exam:  General: alert, cooperative, and no distress CV: Regular rate and rhythm Resp: clear Abdomen: soft, nontender, normal bowel sounds Incision: clean, dry, and intact Uterine Fundus: firm, below umbilicus, nontender Lochia: minimal and no clots Ext:  trace edema, neg for pain, tenderness, or cords   Assessment/Plan: 32 y.o.   POD# 2. [id number]  Normal post-op exam                 Principal Problem:   Previous cesarean section Active Problems:   Postpartum care following cesarean delivery   Preeclampsia, severe, third trimester Continue Procardia XL 30 mg PO  Routine post-op PP care           Anticipate D/C 04/16/22  Arrie Eastern, DNP, CNM 04/15/2022, 11:25 AM

## 2022-04-16 ENCOUNTER — Other Ambulatory Visit (HOSPITAL_COMMUNITY): Payer: Self-pay

## 2022-04-16 ENCOUNTER — Encounter (HOSPITAL_COMMUNITY): Payer: Self-pay | Admitting: Obstetrics & Gynecology

## 2022-04-16 DIAGNOSIS — O1490 Unspecified pre-eclampsia, unspecified trimester: Secondary | ICD-10-CM | POA: Diagnosis present

## 2022-04-16 LAB — GLUCOSE, CAPILLARY
Glucose-Capillary: 136 mg/dL — ABNORMAL HIGH (ref 70–99)
Glucose-Capillary: 157 mg/dL — ABNORMAL HIGH (ref 70–99)
Glucose-Capillary: 147 mg/dL — ABNORMAL HIGH (ref 70–99)

## 2022-04-16 LAB — TYPE AND SCREEN
ABO/RH(D): B POS
Antibody Screen: POSITIVE
Unit division: 0
Unit division: 0
Unit division: 0
Unit division: 0

## 2022-04-16 LAB — BPAM RBC
Blood Product Expiration Date: 202404042359
Blood Product Expiration Date: 202404042359
Blood Product Expiration Date: 202404042359
Blood Product Expiration Date: 202404042359
Unit Type and Rh: 7300
Unit Type and Rh: 7300
Unit Type and Rh: 7300
Unit Type and Rh: 7300

## 2022-04-16 MED ORDER — NIFEDIPINE ER OSMOTIC RELEASE 30 MG PO TB24
30.0000 mg | ORAL_TABLET | Freq: Once | ORAL | Status: AC
  Administered 2022-04-16: 30 mg via ORAL
  Filled 2022-04-16: qty 1

## 2022-04-16 MED ORDER — IBUPROFEN 600 MG PO TABS
600.0000 mg | ORAL_TABLET | Freq: Four times a day (QID) | ORAL | 0 refills | Status: DC
  Filled 2022-04-16: qty 30, 8d supply, fill #0

## 2022-04-16 MED ORDER — NIFEDIPINE ER OSMOTIC RELEASE 30 MG PO TB24
90.0000 mg | ORAL_TABLET | Freq: Every day | ORAL | Status: DC
  Administered 2022-04-17: 90 mg via ORAL
  Filled 2022-04-16: qty 3

## 2022-04-16 MED ORDER — NIFEDIPINE ER OSMOTIC RELEASE 30 MG PO TB24: 60.0000 mg | ORAL_TABLET | Freq: Every day | ORAL | Status: DC

## 2022-04-16 MED ORDER — NIFEDIPINE ER 30 MG PO TB24
30.0000 mg | ORAL_TABLET | Freq: Every day | ORAL | 0 refills | Status: DC
  Filled 2022-04-16: qty 30, 30d supply, fill #0

## 2022-04-16 MED ORDER — OXYCODONE HCL 5 MG PO TABS
5.0000 mg | ORAL_TABLET | Freq: Four times a day (QID) | ORAL | 0 refills | Status: AC | PRN
  Filled 2022-04-16: qty 20, 5d supply, fill #0

## 2022-04-16 MED ORDER — NIFEDIPINE ER OSMOTIC RELEASE 30 MG PO TB24: 30.0000 mg | ORAL_TABLET | Freq: Every day | ORAL | Status: DC

## 2022-04-16 MED ORDER — ACETAMINOPHEN 500 MG PO TABS: 1000.0000 mg | ORAL_TABLET | Freq: Four times a day (QID) | ORAL | 0 refills | Status: AC

## 2022-04-16 NOTE — Discharge Summary (Signed)
Postpartum Discharge Summary  Date of Service updated 04/16/22    Patient Name: Kelly Hunter DOB: 05-10-1990 MRN: [id number]  Date of admission: 04/13/2022 Delivery date:04/13/2022  Delivering provider: Waymon Amato  Date of discharge: 04/16/2022  Admitting diagnosis: Previous cesarean section [Z98.891] Intrauterine pregnancy: [redacted]w[redacted]d     Secondary diagnosis:  Principal Problem:   Previous cesarean section Active Problems:   Postpartum care following cesarean delivery   Preeclampsia, severe, third trimester  Additional problems:  Patient Active Problem List   Diagnosis Date Noted   Preeclampsia 04/16/2022   Previous cesarean section 04/13/2022   Postpartum care following cesarean delivery 04/13/2022   Major depression, recurrent ([place]) 04/02/2021   H/O anxiety disorder 06/17/2020   H/O: depression 06/17/2020   HIV (human immunodeficiency virus) risk factors complicating pregnancy [id number]   Genital herpes 04/27/2016   Type 2 diabetes mellitus ([place]) 11/17/2014   HIV disease ([place]) 01/15/2014   ASTHMA, EXERCISE INDUCED 03/30/2006       Discharge diagnosis: Term Pregnancy Delivered, Preeclampsia (mild), and Type 2 DM       Hx of depression and anxiety. Counseled during this hospitalization and pt declines medication. Mood eval to be completed in office at 2 weeks postpartum                                        Post partum procedures: none Augmentation: N/A Complications: None  Hospital course: Sceduled C/S   32 y.o. yo [id number] at [redacted]w[redacted]d was admitted to the hospital 04/13/2022 for scheduled cesarean section with the following indication:Elective Repeat.Delivery details are as follows:  Membrane Rupture Time/Date: 8:15 AM ,04/13/2022   Delivery Method:C-Section, Low Transverse  Details of operation can be found in separate operative note.  Patient had a postpartum course complicated by elevated blood pressures managed on procardia XL 30 mg PO daily.  She is ambulating,  tolerating a regular diet, passing flatus, and urinating well. Patient is discharged home in stable condition on  04/16/22        Newborn Data: Birth date:04/13/2022  Birth time:8:15 AM  Gender:Female  Living status:Living  Apgars:8 ,9  Weight:3290 g     Magnesium Sulfate received: No BMZ received: No Rhophylac:N/A MMR:N/A Transfusion:No  Physical exam  Vitals:   04/15/22 1444 04/15/22 2013 04/15/22 2115 04/16/22 0643  BP: 128/78 (!) 136/98 139/87 128/89  Pulse: 93 98 96 92  Resp: 18 16  16   Temp: 98.2 F (36.8 C) 98.3 F (36.8 C)  98.2 F (36.8 C)  TempSrc: Oral Oral  Oral  SpO2: 98%     Weight:      Height:       General: alert, cooperative, and no distress Lochia: appropriate Uterine Fundus: firm Incision: Healing well with no significant drainage, Dressing is clean, dry, and intact DVT Evaluation: No evidence of DVT seen on physical exam. No cords or calf tenderness. No significant calf/ankle edema. Calf/Ankle edema is present Labs: Lab Results  Component Value Date   WBC 14.9 (H) 04/14/2022   HGB 10.5 (L) 04/14/2022   HCT 30.0 (L) 04/14/2022   MCV 80.2 04/14/2022   PLT 336 04/14/2022      Latest Ref Rng & Units 04/13/2022    9:43 AM  CMP  Glucose 70 - 99 mg/dL 155   BUN 6 - 20 mg/dL <5   Creatinine 0.44 - 1.00 mg/dL 0.67   Sodium 135 -  145 mmol/L 137   Potassium 3.5 - 5.1 mmol/L 3.8   Chloride 98 - 111 mmol/L 105   CO2 22 - 32 mmol/L 24   Calcium 8.9 - 10.3 mg/dL 8.3   Total Protein 6.5 - 8.1 g/dL 5.8   Total Bilirubin 0.3 - 1.2 mg/dL 0.3   Alkaline Phos 38 - 126 U/L 120   AST 15 - 41 U/L 14   ALT 0 - 44 U/L 9    Edinburgh Score:    04/15/2022    8:05 AM  Edinburgh Postnatal Depression Scale Screening Tool  I have been able to laugh and see the funny side of things. 0  I have looked forward with enjoyment to things. 0  I have blamed myself unnecessarily when things went wrong. 0  I have been anxious or worried for no good reason. 2  I have  felt scared or panicky for no good reason. 0  Things have been getting on top of me. 0  I have been so unhappy that I have had difficulty sleeping. 0  I have felt sad or miserable. 0  I have been so unhappy that I have been crying. 0  The thought of harming myself has occurred to me. 0  Edinburgh Postnatal Depression Scale Total 2      After visit meds:  Allergies as of 04/16/2022       Reactions   Cashew Nut Oil    Pt states she is allergic to cashews and to all nuts   Other Nausea And Vomiting, Swelling   SWELLING REACTION UNSPECIFIED  Tree Nuts (Almonds)   Peanut Oil Nausea And Vomiting, Swelling   SWELLING REACTION UNSPECIFIED    Peanut-containing Drug Products Nausea And Vomiting, Swelling   SWELLING REACTION UNSPECIFIED    Pyridium [phenazopyridine Hcl] Nausea And Vomiting   Bactrim [sulfamethoxazole-trimethoprim] Rash        Medication List     TAKE these medications    acetaminophen 500 MG tablet Commonly known as: TYLENOL Take 2 tablets (1,000 mg total) by mouth every 6 (six) hours.   Descovy 200-25 MG tablet Generic drug: emtricitabine-tenofovir AF Take 1 tablet by mouth daily.   ibuprofen 600 MG tablet Commonly known as: ADVIL Take 1 tablet (600 mg total) by mouth every 6 (six) hours.   insulin lispro 100 UNIT/ML injection Commonly known as: HUMALOG Inject 2 Units into the skin 3 (three) times daily before meals. Sliding scale   insulin NPH Human 100 UNIT/ML injection Commonly known as: NOVOLIN N Inject 8 Units into the skin 2 (two) times daily.   NIFEdipine 30 MG 24 hr tablet Commonly known as: ADALAT CC Take 1 tablet (30 mg total) by mouth daily.   oxyCODONE 5 MG immediate release tablet Commonly known as: Oxy IR/ROXICODONE Take 1 tablet (5 mg total) by mouth every 6 (six) hours as needed for up to 5 days for moderate pain.   Tivicay 50 MG tablet Generic drug: dolutegravir Take 1 tablet (50 mg total) by mouth daily.   valACYclovir 1000 MG  tablet Commonly known as: Valtrex Take 1 tablet by mouth once daily.               Discharge Care Instructions  (From admission, onward)           Start     Ordered   04/16/22 0000  Discharge wound care:       Comments: Take dressing off on 04/16/25, remove it sooner if it is dirty  or damaged. Clean area with soap and water and pat dry. You can leave the steri strips on until they fall off or take them off gently by 04/19/22. Call the office for increased drainage, redness, pain, or warmth. Keep the incision area clean and dry at all times.   04/16/22 0656             Discharge home in stable condition Infant Feeding: Bottle Infant Disposition:NICU Discharge instruction: per After Visit Summary and Postpartum booklet. Activity: Advance as tolerated. Pelvic rest for 6 weeks.  Diet: carb modified diet Anticipated Birth Control: IUD and Nexplanon Postpartum Appointment:6 weeks Additional Postpartum F/U: Postpartum Depression checkup and BP check 1 week Future Appointments:No future appointments. Follow up Visit:  Follow-up Information     Waymon Amato, MD. Go in 6 week(s).   Specialty: Obstetrics and Gynecology Why: Return to [facility] in 1 week for BP check and then in 6 weeks for regular postpartum visit. Contact information: [place] [address] [phone]                     04/16/2022 Arrie Eastern, CNM

## 2022-04-17 ENCOUNTER — Other Ambulatory Visit: Payer: Self-pay

## 2022-04-17 LAB — GLUCOSE, CAPILLARY: Glucose-Capillary: 143 mg/dL — ABNORMAL HIGH (ref 70–99)

## 2022-04-17 MED ORDER — NIFEDIPINE ER OSMOTIC RELEASE 90 MG PO TB24
90.0000 mg | ORAL_TABLET | Freq: Every day | ORAL | 1 refills | Status: DC
  Filled 2022-04-17: qty 30, 30d supply, fill #0

## 2022-04-17 NOTE — H&P (Signed)
Postpartum Discharge Summary   Date of Service updated 04/17/22                          Patient Name: Kelly Hunter DOB: [birth year] MRN: [id number]   Date of admission: 04/13/2022 Delivery date:04/13/2022  Delivering provider: Waymon Amato  Date of discharge: 04/17/2022   Admitting diagnosis: Previous cesarean section [Z98.891] Intrauterine pregnancy: [redacted]w[redacted]d     Secondary diagnosis:  Principal Problem:   Previous cesarean section Active Problems:   Postpartum care following cesarean delivery   Preeclampsia, severe, third trimester   Additional problems:      Patient Active Problem List    Diagnosis Date Noted   Preeclampsia 04/16/2022   Previous cesarean section 04/13/2022   Postpartum care following cesarean delivery 04/13/2022   Major depression, recurrent ([place]) 04/02/2021   H/O anxiety disorder 06/17/2020   H/O: depression 06/17/2020   HIV (human immunodeficiency virus) risk factors complicating pregnancy 123XX123   Genital herpes 04/27/2016   Type 2 diabetes mellitus ([place]) 11/17/2014   HIV disease ([place]) 01/15/2014   ASTHMA, EXERCISE INDUCED 03/30/2006                                         Discharge diagnosis: Term Pregnancy Delivered, Preeclampsia (mild), and Type 2 DM       Hx of depression and anxiety. Counseled during this hospitalization and pt declines medication. Mood eval to be completed in office at 2 weeks postpartum                                        Post partum procedures: none Augmentation: N/A Complications: None   Hospital course: Sceduled C/S   32 y.o. yo [id number] at [redacted]w[redacted]d was admitted to the hospital 04/13/2022 for scheduled cesarean section with the following indication:Elective Repeat.Delivery details are as follows:  Membrane Rupture Time/Date: 8:15 AM ,04/13/2022   Delivery Method:C-Section, Low Transverse  Details of operation can be found in separate operative note.  Was admitted on 3/13 and went for a RCS with DR Alesia Richards, qbl was 349mls, hgb  drop of 12.2-10.5. Patient had a postpartum course complicated by elevated blood pressures managed on procardia XL 30 mg PO daily, 3/16 was increased to 90mg   XL, asymptomatic PP, PCR was 0.34, BP was 143/94, will have one week BP check.  HIV + continues tivicay and descovy, undetectable levels. Mood stable.  DM2 will continue metformin 1000mg  BID PO and home insuline of NPH 8 unit BID with sliding scale with meals if needed. She is ambulating, tolerating a regular diet, passing flatus, and urinating well. Patient is discharged home in stable condition on  04/17/22        Newborn Data: Birth date:04/13/2022  Birth time:8:15 AM  Gender:Female  Living status:Living  Apgars:8 ,9  Weight:3290 g      Magnesium Sulfate received: No BMZ received: No Rhophylac:N/A MMR:N/A Transfusion:No   Physical exam        Vitals:    04/15/22 1444 04/15/22 2013 04/15/22 2115 04/16/22 0643  BP: 128/78 (!) 136/98 139/87 128/89  Pulse: 93 98 96 92  Resp: 18 16   16   Temp: 98.2 F (36.8 C) 98.3 F (36.8 C)   98.2 F (36.8 C)  TempSrc: Oral Oral  Oral  SpO2: 98%        Weight:          Height:           BP (!) 143/94 (BP Location: Right Arm)   Pulse 100   Temp 98.2 F (36.8 C) (Oral)   Resp 18   Ht 5\' 3"  (1.6 m)   Wt 107.9 kg   LMP 08/05/2021 (Approximate)   SpO2 95%   Breastfeeding Unknown   BMI 42.14 kg/m    General: alert, cooperative, and no distress Lochia: appropriate Uterine Fundus: firm Incision: Healing well with no significant drainage, Dressing is clean, dry, and intact DVT Evaluation: No evidence of DVT seen on physical exam. No cords or calf tenderness. No significant calf/ankle edema. Calf/Ankle edema is present Labs: Recent Labs       Lab Results  Component Value Date    WBC 14.9 (H) 04/14/2022    HGB 10.5 (L) 04/14/2022    HCT 30.0 (L) 04/14/2022    MCV 80.2 04/14/2022    PLT 336 04/14/2022          Latest Ref Rng & Units 04/13/2022    9:43 AM  CMP  Glucose 70  - 99 mg/dL 155   BUN 6 - 20 mg/dL <5   Creatinine 0.44 - 1.00 mg/dL 0.67   Sodium 135 - 145 mmol/L 137   Potassium 3.5 - 5.1 mmol/L 3.8   Chloride 98 - 111 mmol/L 105   CO2 22 - 32 mmol/L 24   Calcium 8.9 - 10.3 mg/dL 8.3   Total Protein 6.5 - 8.1 g/dL 5.8   Total Bilirubin 0.3 - 1.2 mg/dL 0.3   Alkaline Phos 38 - 126 U/L 120   AST 15 - 41 U/L 14   ALT 0 - 44 U/L 9     Edinburgh Score:     04/15/2022    8:05 AM  Edinburgh Postnatal Depression Scale Screening Tool  I have been able to laugh and see the funny side of things. 0  I have looked forward with enjoyment to things. 0  I have blamed myself unnecessarily when things went wrong. 0  I have been anxious or worried for no good reason. 2  I have felt scared or panicky for no good reason. 0  Things have been getting on top of me. 0  I have been so unhappy that I have had difficulty sleeping. 0  I have felt sad or miserable. 0  I have been so unhappy that I have been crying. 0  The thought of harming myself has occurred to me. 0  Edinburgh Postnatal Depression Scale Total 2          After visit meds:  Allergies as of 04/16/2022         Reactions    Cashew Nut Oil      Pt states she is allergic to cashews and to all nuts    Other Nausea And Vomiting, Swelling    SWELLING REACTION UNSPECIFIED  Tree Nuts (Almonds)    Peanut Oil Nausea And Vomiting, Swelling    SWELLING REACTION UNSPECIFIED     Peanut-containing Drug Products Nausea And Vomiting, Swelling    SWELLING REACTION UNSPECIFIED     Pyridium [phenazopyridine Hcl] Nausea And Vomiting    Bactrim [sulfamethoxazole-trimethoprim] Rash            Medication List       TAKE these medications     acetaminophen 500 MG tablet  Commonly known as: TYLENOL Take 2 tablets (1,000 mg total) by mouth every 6 (six) hours.    Descovy 200-25 MG tablet Generic drug: emtricitabine-tenofovir AF Take 1 tablet by mouth daily.    ibuprofen 600 MG tablet Commonly known as:  ADVIL Take 1 tablet (600 mg total) by mouth every 6 (six) hours.    insulin lispro 100 UNIT/ML injection Commonly known as: HUMALOG Inject 2 Units into the skin 3 (three) times daily before meals. Sliding scale    insulin NPH Human 100 UNIT/ML injection Commonly known as: NOVOLIN N Inject 8 Units into the skin 2 (two) times daily.    NIFEdipine 90 MG 24 hr tablet Commonly known as: ADALAT CC Take 1 tablet (30 mg total) by mouth daily.    oxyCODONE 5 MG immediate release tablet Commonly known as: Oxy IR/ROXICODONE Take 1 tablet (5 mg total) by mouth every 6 (six) hours as needed for up to 5 days for moderate pain.    Tivicay 50 MG tablet Generic drug: dolutegravir Take 1 tablet (50 mg total) by mouth daily.    valACYclovir 1000 MG tablet Commonly known as: Valtrex Take 1 tablet by mouth once daily.                        Discharge Care Instructions  (From admission, onward)                 Start     Ordered    04/16/22 0000   Discharge wound care:       Comments: Take dressing off on 04/16/25, remove it sooner if it is dirty or damaged. Clean area with soap and water and pat dry. You can leave the steri strips on until they fall off or take them off gently by 04/19/22. Call the office for increased drainage, redness, pain, or warmth. Keep the incision area clean and dry at all times.   04/16/22 0656                    Discharge home in stable condition Infant Feeding: Bottle Infant Disposition:NICU Discharge instruction: per After Visit Summary and Postpartum booklet. Activity: Advance as tolerated. Pelvic rest for 6 weeks.  Diet: carb modified diet Anticipated Birth Control: IUD and Nexplanon Postpartum Appointment:6 weeks Additional Postpartum F/U: Postpartum Depression checkup and BP check 1 week Future Appointments:No future appointments. Follow up Visit:   Follow-up Information       Waymon Amato, MD. Go in 6 week(s).   Specialty: Obstetrics and  Gynecology Why: Return to [facility] in 1 week for BP check and then in 6 weeks for regular postpartum visit. Contact information: [address] [place] [address] [phone]

## 2022-04-18 ENCOUNTER — Other Ambulatory Visit (HOSPITAL_COMMUNITY): Payer: Self-pay

## 2022-04-18 ENCOUNTER — Other Ambulatory Visit: Payer: Self-pay

## 2022-04-25 ENCOUNTER — Telehealth (HOSPITAL_COMMUNITY): Payer: Self-pay

## 2022-04-25 ENCOUNTER — Telehealth (HOSPITAL_COMMUNITY): Payer: Self-pay | Admitting: *Deleted

## 2022-04-25 NOTE — Telephone Encounter (Signed)
Patient sleeping when I called patient. Will attempt call again at a different time.   [facility]   04/25/22,1021

## 2022-04-25 NOTE — Telephone Encounter (Signed)
Attempted hospital discharge follow-up call. Left message for patient to return RN call with any questions or concerns. Erline Levine, RN, 04/25/22, [phone]

## 2022-05-10 ENCOUNTER — Other Ambulatory Visit (HOSPITAL_COMMUNITY): Payer: Self-pay

## 2022-05-12 ENCOUNTER — Other Ambulatory Visit: Payer: Self-pay

## 2022-05-12 ENCOUNTER — Other Ambulatory Visit (HOSPITAL_COMMUNITY): Payer: Self-pay

## 2022-06-08 ENCOUNTER — Other Ambulatory Visit (HOSPITAL_COMMUNITY): Payer: Self-pay

## 2022-06-13 ENCOUNTER — Other Ambulatory Visit (HOSPITAL_COMMUNITY): Payer: Self-pay

## 2022-07-07 ENCOUNTER — Other Ambulatory Visit (HOSPITAL_COMMUNITY): Payer: Self-pay

## 2022-07-11 ENCOUNTER — Other Ambulatory Visit (HOSPITAL_COMMUNITY): Payer: Self-pay

## 2022-07-13 ENCOUNTER — Other Ambulatory Visit (HOSPITAL_COMMUNITY): Payer: Self-pay

## 2022-07-13 ENCOUNTER — Encounter (HOSPITAL_COMMUNITY): Payer: Self-pay

## 2022-08-05 ENCOUNTER — Other Ambulatory Visit (HOSPITAL_COMMUNITY): Payer: Self-pay

## 2022-08-08 ENCOUNTER — Encounter (HOSPITAL_COMMUNITY): Payer: Self-pay

## 2022-08-08 ENCOUNTER — Other Ambulatory Visit (HOSPITAL_COMMUNITY): Payer: Self-pay

## 2022-08-15 ENCOUNTER — Other Ambulatory Visit (HOSPITAL_COMMUNITY): Payer: Self-pay

## 2022-08-15 ENCOUNTER — Other Ambulatory Visit: Payer: Self-pay | Admitting: Infectious Disease

## 2022-08-15 DIAGNOSIS — A6000 Herpesviral infection of urogenital system, unspecified: Secondary | ICD-10-CM

## 2022-08-17 ENCOUNTER — Other Ambulatory Visit (HOSPITAL_COMMUNITY): Payer: Self-pay

## 2022-08-17 ENCOUNTER — Other Ambulatory Visit: Payer: Self-pay

## 2022-08-24 ENCOUNTER — Ambulatory Visit
Admission: EM | Admit: 2022-08-24 | Discharge: 2022-08-24 | Disposition: A | Discharge status: Home / Self Care | Payer: 59 | Attending: Internal Medicine | Admitting: Internal Medicine

## 2022-08-24 DIAGNOSIS — R21 Rash and other nonspecific skin eruption: Secondary | ICD-10-CM

## 2022-08-24 DIAGNOSIS — B20 Human immunodeficiency virus [HIV] disease: Secondary | ICD-10-CM

## 2022-08-24 MED ORDER — CLOTRIMAZOLE 1 % EX CREA: TOPICAL_CREAM | CUTANEOUS | 0 refills | Status: AC

## 2022-08-24 MED ORDER — FLUCONAZOLE 150 MG PO TABS: 150.0000 mg | ORAL_TABLET | ORAL | 0 refills | Status: DC

## 2022-08-24 MED ORDER — CEPHALEXIN 500 MG PO CAPS: 500.0000 mg | ORAL_CAPSULE | Freq: Three times a day (TID) | ORAL | 0 refills | Status: AC

## 2022-08-24 NOTE — ED Triage Notes (Signed)
Pt c/o scattered rash/bumps x 1.5 weeks-states she has similar rash when she was pregnant 12/2021-NAD-steady gait

## 2022-08-24 NOTE — Discharge Instructions (Addendum)
Take cephalexin antibiotic 3 times a day for the next 7 days to treat bacterial component to rash. Take Diflucan pill once today, then again in 3 days to treat fungal component to rash. Apply clotrimazole cream to the rash twice daily for the next 7 to 10 days to treat fungal component to rash.  Wash all bedding in hot water. Follow-up with primary care provider in the next 5 to 7 days for reevaluation to make sure that the rash is improving.  If you develop any new or worsening symptoms or if your symptoms do not start to improve, pleases return here or follow-up with your primary care provider. If your symptoms are severe, please go to the emergency room.

## 2022-08-24 NOTE — ED Provider Notes (Signed)
[facility]    CSN: [id number] Arrival date & time: 08/24/22  1753      History   Chief Complaint Chief Complaint  Patient presents with   Rash    HPI Kelly Hunter is a 32 y.o. female.   Patient presents to urgent care for evaluation of diffuse rash to the body consisting of singular lesions that started 1.5-2 weeks ago.   This started happening when they moved to their house Son has similar rash Started happening while she was pregnant in December Lesions are itchy, then crust over and heal but leave scar History of HIV and type 2 diabetes, immunosuppression No fevers or other systemic symptoms      Past Medical History:  Diagnosis Date   Acne    Allergy    Allegra   Asthma    mild; onset in childhood.  No hospitalizations.   ASTHMA, EXERCISE INDUCED    Cervical high risk HPV (human papillomavirus) test positive 07/2019   cervical cancer screening due in December, 2021   Chlamydia 03/03/2012   CIN I (cervical intraepithelial neoplasia I) 08/31/2013, 05/01/17   colposcopy by Lily Peer; s/p CO2 laser treatment. repeat colposcopy by Dr. Edward Jolly.   Condyloma acuminatum of vulva 06/18/2013   Dysplasia of cervix, low grade (CIN 1) 06/18/2013   Eczema    Elevated blood sugar 12/25/2017   Encounter for induction of labor 06/17/2020   Genital warts 08/31/2013   Aldara treatment; Lily Peer.   Gestational diabetes    Grieving 12/25/2017   Herpes    Hidradenitis    Hidradenitis axillaris 05/21/2014   HIV (human immunodeficiency virus infection) (HCC)    HIV (human immunodeficiency virus) risk factors complicating pregnancy 11/25/2019   HSV-2 seropositive    Maternal obesity affecting pregnancy, antepartum 03/30/2006   Qualifier: Diagnosis of   By: Levada Schilling       Migraine headache without aura    Mixed anxiety depressive disorder 06/16/2020   Moderate episode of recurrent major depressive disorder (HCC) 10/24/2018   Pregnancy induced  hypertension    Severe preeclampsia 06/17/2020   Substance abuse (HCC)    Type 2 diabetes mellitus without complication, without long-term current use of insulin (HCC) 11/17/2014   Vaccine counseling 01/22/2020   Vaginal Pap smear, abnormal    VIN I (vulvar intraepithelial neoplasia I) 08/31/2013   s/p CO2 treatment; Fernandez/gyn.    Patient Active Problem List   Diagnosis Date Noted   Preeclampsia 04/16/2022   Previous cesarean section 04/13/2022   Postpartum care following cesarean delivery 04/13/2022   Major depression, recurrent (HCC) 04/02/2021   H/O anxiety disorder 06/17/2020   H/O: depression 06/17/2020   HIV (human immunodeficiency virus) risk factors complicating pregnancy 11/25/2019   Genital herpes 04/27/2016   Type 2 diabetes mellitus (HCC) 11/17/2014   HIV disease (HCC) 01/15/2014   ASTHMA, EXERCISE INDUCED 03/30/2006    Past Surgical History:  Procedure Laterality Date   BREAST SURGERY  02/01/2008   Reduction   CESAREAN SECTION N/A 06/18/2020   Procedure: CESAREAN SECTION;  Surgeon: Essie Hart, MD;  Location: [facility];  Service: Obstetrics;  Laterality: N/A;   CESAREAN SECTION N/A 04/13/2022   Procedure: CESAREAN SECTION;  Surgeon: Hoover Browns, MD;  Location: [facility];  Service: Obstetrics;  Laterality: N/A;   CO2 LASER APPLICATION N/A 07/18/2013   Procedure: C02 laser of vagina and cervix;  Surgeon: Ok Edwards, MD;  Location: [facility];  Service: Gynecology;  Laterality: N/A;  INCISION AND DRAINAGE ABSCESS Left 03/09/2017   Procedure: INCISION AND DRAINAGE ABSCESS LEFT BREAST ABSCESS;  Surgeon: Harriette Bouillon, MD;  Location: [facility] OR;  Service: General;  Laterality: Left;    OB History     Gravida  3   Para  2   Term  2   Preterm  0   AB  1   Living  2      SAB  0   IAB  1   Ectopic  0   Multiple  0   Live Births  2            Home Medications    Prior to Admission medications   Medication Sig Start Date End Date Taking?  Authorizing Provider  cephALEXin (KEFLEX) 500 MG capsule Take 1 capsule (500 mg total) by mouth 3 (three) times daily for 7 days. 08/24/22 08/31/22 Yes StanhopeDonavan Burnet, FNP  clotrimazole (LOTRIMIN) 1 % cream Apply to affected area 2 times daily 08/24/22  Yes Ashayla Subia, Donavan Burnet, FNP  acetaminophen (TYLENOL) 500 MG tablet Take 2 tablets (1,000 mg total) by mouth every 6 (six) hours. 04/16/22   Roma Schanz, CNM  dolutegravir (TIVICAY) 50 MG tablet Take 1 tablet (50 mg total) by mouth daily. 12/27/21   Randall Hiss, MD  emtricitabine-tenofovir AF (DESCOVY) 200-25 MG tablet Take 1 tablet by mouth daily. 12/27/21   Randall Hiss, MD  ibuprofen (ADVIL) 600 MG tablet Take 1 tablet (600 mg total) by mouth every 6 (six) hours. 04/16/22   Roma Schanz, CNM  insulin lispro (HUMALOG) 100 UNIT/ML injection Inject 2 Units into the skin 3 (three) times daily before meals. Sliding scale    [provider]  insulin NPH Human (NOVOLIN N) 100 UNIT/ML injection Inject 8 Units into the skin 2 (two) times daily.    [provider]  NIFEdipine (PROCARDIA XL/NIFEDICAL-XL) 90 MG 24 hr tablet Take 1 tablet (90 mg total) by mouth daily. 04/17/22   Dale Oasis, FNP  valACYclovir (VALTREX) 1000 MG tablet Take 1 tablet by mouth once daily. 12/15/21   Randall Hiss, MD    Family History Family History  Problem Relation Age of Onset   Diabetes Mother    Hypertension Mother    Hyperlipidemia Mother    Mental illness Mother    Pulmonary embolism Mother    Cancer Paternal Aunt        Cervical or Ovarian per patient   Cancer Maternal Grandfather        ?   Heart disease Neg Hx    Asthma Neg Hx     Social History Social History   Tobacco Use   Smoking status: Never   Smokeless tobacco: Never  Vaping Use   Vaping status: Never Used  Substance Use Topics   Alcohol use: Yes    Comment: occasionally   Drug use: Not Currently    Types: Marijuana     Allergies    Cashew nut oil, Other, Peanut oil, Peanut-containing drug products, Pyridium [phenazopyridine hcl], and Bactrim [sulfamethoxazole-trimethoprim]   Review of Systems Review of Systems   Physical Exam Triage Vital Signs ED Triage Vitals [08/24/22 1915]  Encounter Vitals Group     BP (!) 139/91     Systolic BP Percentile      Diastolic BP Percentile      Pulse Rate 93     Resp 20     Temp 99.1 F (37.3 C)  Temp Source Oral     SpO2 97 %     Weight      Height      Head Circumference      Peak Flow      Pain Score 0     Pain Loc      Pain Education      Exclude from Growth Chart    No data found.  Updated Vital Signs BP (!) 139/91 (BP Location: Right Arm)   Pulse 93   Temp 99.1 F (37.3 C) (Oral)   Resp 20   LMP 08/21/2022   SpO2 97%   Visual Acuity Right Eye Distance:   Left Eye Distance:   Bilateral Distance:    Right Eye Near:   Left Eye Near:    Bilateral Near:     Physical Exam   First lesion that started in December 2023 while she was pregnant   New forming lesion to the left thigh   New forming lesion to the left high as well   Lesion that has been present for 1.5 weeks to the left wrist   Lesion that has been present to the left forearm for 1.5 weeks    UC Treatments / Results  Labs (all labs ordered are listed, but only abnormal results are displayed) Labs Reviewed - No data to display  EKG   Radiology No results found.  Procedures Procedures (including critical care time)  Medications Ordered in UC Medications - No data to display  Initial Impression / Assessment and Plan / UC Course  I have reviewed the triage vital signs and the nursing notes.  Pertinent labs & imaging results that were available during my care of the patient were reviewed by me and considered in my medical decision making (see chart for details).     *** Final Clinical Impressions(s) / UC Diagnoses   Final diagnoses:  Rash and nonspecific  skin eruption   Discharge Instructions   None    ED Prescriptions     Medication Sig Dispense Auth. Provider   cephALEXin (KEFLEX) 500 MG capsule Take 1 capsule (500 mg total) by mouth 3 (three) times daily for 7 days. 21 capsule Reita May M, FNP   clotrimazole (LOTRIMIN) 1 % cream Apply to affected area 2 times daily 15 g Carlisle Beers, FNP      PDMP not reviewed this encounter.

## 2022-09-05 ENCOUNTER — Other Ambulatory Visit: Payer: Self-pay

## 2022-09-07 ENCOUNTER — Other Ambulatory Visit: Payer: Self-pay

## 2022-09-09 ENCOUNTER — Other Ambulatory Visit (HOSPITAL_COMMUNITY): Payer: Self-pay

## 2022-09-09 ENCOUNTER — Other Ambulatory Visit: Payer: Self-pay

## 2022-09-12 ENCOUNTER — Other Ambulatory Visit (HOSPITAL_COMMUNITY): Payer: Self-pay

## 2022-09-28 ENCOUNTER — Ambulatory Visit
Admission: EM | Admit: 2022-09-28 | Discharge: 2022-09-28 | Disposition: A | Discharge status: Home / Self Care | Payer: 59 | Attending: Internal Medicine | Admitting: Internal Medicine

## 2022-09-28 DIAGNOSIS — L910 Hypertrophic scar: Secondary | ICD-10-CM | POA: Diagnosis not present

## 2022-09-28 MED ORDER — MUPIROCIN CALCIUM 2 % EX CREA: 1.0000 | TOPICAL_CREAM | Freq: Three times a day (TID) | CUTANEOUS | 0 refills | Status: AC

## 2022-09-28 NOTE — Discharge Instructions (Signed)
Keep the area clean and dry and apply mupirocin topical antibiotic cream to the area 3 times a day for a week.  You can follow-up with dermatology for discussion of possible removal of the keloid.  Please go to the emergency room if you develop any worsening symptoms.  I hope you feel better soon!

## 2022-09-28 NOTE — ED Provider Notes (Signed)
[facility]    CSN: [id number] Arrival date & time: 09/28/22  1713      History   Chief Complaint No chief complaint on file.   HPI Kelly Hunter is a 32 y.o. female for possible abscess.  Patient reports she has had an intermittent abscess to the back of her neck since she was 14.  Reports it began after she was cut with some shears during a haircut.  States this morning she woke and it seemed to be draining and swollen.  No fevers or chills.  No injury to the area.  No history of MRSA.  No other concerns at this time.  HPI  Past Medical History:  Diagnosis Date   Acne    Allergy    Allegra   Asthma    mild; onset in childhood.  No hospitalizations.   ASTHMA, EXERCISE INDUCED    Cervical high risk HPV (human papillomavirus) test positive 07/2019   cervical cancer screening due in December, 2021   Chlamydia 03/03/2012   CIN I (cervical intraepithelial neoplasia I) 08/31/2013, 05/01/17   colposcopy by Lily Peer; s/p CO2 laser treatment. repeat colposcopy by Dr. Edward Jolly.   Condyloma acuminatum of vulva 06/18/2013   Dysplasia of cervix, low grade (CIN 1) 06/18/2013   Eczema    Elevated blood sugar 12/25/2017   Encounter for induction of labor 06/17/2020   Genital warts 08/31/2013   Aldara treatment; Lily Peer.   Gestational diabetes    Grieving 12/25/2017   Herpes    Hidradenitis    Hidradenitis axillaris 05/21/2014   HIV (human immunodeficiency virus infection) (HCC)    HIV (human immunodeficiency virus) risk factors complicating pregnancy 11/25/2019   HSV-2 seropositive    Maternal obesity affecting pregnancy, antepartum 03/30/2006   Qualifier: Diagnosis of   By: Levada Schilling       Migraine headache without aura    Mixed anxiety depressive disorder 06/16/2020   Moderate episode of recurrent major depressive disorder (HCC) 10/24/2018   Pregnancy induced hypertension    Severe preeclampsia 06/17/2020   Substance abuse (HCC)    Type 2 diabetes mellitus  without complication, without long-term current use of insulin (HCC) 11/17/2014   Vaccine counseling 01/22/2020   Vaginal Pap smear, abnormal    VIN I (vulvar intraepithelial neoplasia I) 08/31/2013   s/p CO2 treatment; Fernandez/gyn.    Patient Active Problem List   Diagnosis Date Noted   Preeclampsia 04/16/2022   Previous cesarean section 04/13/2022   Postpartum care following cesarean delivery 04/13/2022   Major depression, recurrent (HCC) 04/02/2021   H/O anxiety disorder 06/17/2020   H/O: depression 06/17/2020   HIV (human immunodeficiency virus) risk factors complicating pregnancy 11/25/2019   Genital herpes 04/27/2016   Type 2 diabetes mellitus (HCC) 11/17/2014   HIV disease (HCC) 01/15/2014   ASTHMA, EXERCISE INDUCED 03/30/2006    Past Surgical History:  Procedure Laterality Date   BREAST SURGERY  02/01/2008   Reduction   CESAREAN SECTION N/A 06/18/2020   Procedure: CESAREAN SECTION;  Surgeon: Essie Hart, MD;  Location: [facility];  Service: Obstetrics;  Laterality: N/A;   CESAREAN SECTION N/A 04/13/2022   Procedure: CESAREAN SECTION;  Surgeon: Hoover Browns, MD;  Location: [facility];  Service: Obstetrics;  Laterality: N/A;   CO2 LASER APPLICATION N/A 07/18/2013   Procedure: C02 laser of vagina and cervix;  Surgeon: Ok Edwards, MD;  Location: [facility];  Service: Gynecology;  Laterality: N/A;   INCISION AND DRAINAGE ABSCESS Left 03/09/2017  Procedure: INCISION AND DRAINAGE ABSCESS LEFT BREAST ABSCESS;  Surgeon: Harriette Bouillon, MD;  Location: [facility];  Service: General;  Laterality: Left;    OB History     Gravida  3   Para  2   Term  2   Preterm  0   AB  1   Living  2      SAB  0   IAB  1   Ectopic  0   Multiple  0   Live Births  2            Home Medications    Prior to Admission medications   Medication Sig Start Date End Date Taking? Authorizing Provider  mupirocin cream (BACTROBAN) 2 % Apply 1 Application topically 3 (three) times daily for  7 days. 09/28/22 10/05/22 Yes Radford Pax, NP  acetaminophen (TYLENOL) 500 MG tablet Take 2 tablets (1,000 mg total) by mouth every 6 (six) hours. 04/16/22   Roma Schanz, CNM  clotrimazole (LOTRIMIN) 1 % cream Apply to affected area 2 times daily 08/24/22   Carlisle Beers, FNP  dolutegravir (TIVICAY) 50 MG tablet Take 1 tablet (50 mg total) by mouth daily. 12/27/21   Randall Hiss, MD  emtricitabine-tenofovir AF (DESCOVY) 200-25 MG tablet Take 1 tablet by mouth daily. 12/27/21   Randall Hiss, MD  fluconazole (DIFLUCAN) 150 MG tablet Take 1 tablet (150 mg total) by mouth every 3 (three) days. 08/24/22   Carlisle Beers, FNP  ibuprofen (ADVIL) 600 MG tablet Take 1 tablet (600 mg total) by mouth every 6 (six) hours. 04/16/22   Roma Schanz, CNM  insulin lispro (HUMALOG) 100 UNIT/ML injection Inject 2 Units into the skin 3 (three) times daily before meals. Sliding scale    [provider]  insulin NPH Human (NOVOLIN N) 100 UNIT/ML injection Inject 8 Units into the skin 2 (two) times daily.    [provider]  NIFEdipine (PROCARDIA XL/NIFEDICAL-XL) 90 MG 24 hr tablet Take 1 tablet (90 mg total) by mouth daily. 04/17/22   Dale Enterprise, FNP  valACYclovir (VALTREX) 1000 MG tablet Take 1 tablet by mouth once daily. 12/15/21   Randall Hiss, MD    Family History Family History  Problem Relation Age of Onset   Diabetes Mother    Hypertension Mother    Hyperlipidemia Mother    Mental illness Mother    Pulmonary embolism Mother    Cancer Paternal Aunt        Cervical or Ovarian per patient   Cancer Maternal Grandfather        ?   Heart disease Neg Hx    Asthma Neg Hx     Social History Social History   Tobacco Use   Smoking status: Never   Smokeless tobacco: Never  Vaping Use   Vaping status: Never Used  Substance Use Topics   Alcohol use: Yes    Comment: occasionally   Drug use: Not Currently    Types: Marijuana     Allergies    Cashew nut oil, Other, Peanut oil, Peanut-containing drug products, Pyridium [phenazopyridine hcl], and Bactrim [sulfamethoxazole-trimethoprim]   Review of Systems Review of Systems  Skin:  Positive for wound.     Physical Exam Triage Vital Signs ED Triage Vitals  Encounter Vitals Group     BP 09/28/22 1754 (!) 139/93     Systolic BP Percentile --      Diastolic BP Percentile --  Pulse Rate 09/28/22 1754 64     Resp 09/28/22 1754 16     Temp 09/28/22 1754 98.3 F (36.8 C)     Temp Source 09/28/22 1754 Oral     SpO2 09/28/22 1754 96 %     Weight --      Height --      Head Circumference --      Peak Flow --      Pain Score 09/28/22 1752 6     Pain Loc --      Pain Education --      Exclude from Growth Chart --    No data found.  Updated Vital Signs BP (!) 139/93 (BP Location: Right Arm)   Pulse 64   Temp 98.3 F (36.8 C) (Oral)   Resp 16   SpO2 96%   Visual Acuity Right Eye Distance:   Left Eye Distance:   Bilateral Distance:    Right Eye Near:   Left Eye Near:    Bilateral Near:     Physical Exam Vitals and nursing note reviewed.  Constitutional:      General: She is not in acute distress.    Appearance: Normal appearance. She is not ill-appearing.  HENT:     Head: Normocephalic and atraumatic.  Eyes:     Pupils: Pupils are equal, round, and reactive to light.  Neck:      Comments: There is a 2 cm oblong keloid to the back of the neck.  The right side of the keloid appears to have been scratched and there is some slight irritation, scabbing with clear drainage.  No swelling, erythema, warmth.  Area is tender to palpation. Cardiovascular:     Rate and Rhythm: Normal rate.  Pulmonary:     Effort: Pulmonary effort is normal.  Skin:    General: Skin is warm and dry.  Neurological:     General: No focal deficit present.     Mental Status: She is alert and oriented to person, place, and time.  Psychiatric:        Mood and Affect: Mood normal.         Behavior: Behavior normal.      UC Treatments / Results  Labs (all labs ordered are listed, but only abnormal results are displayed) Labs Reviewed - No data to display  EKG   Radiology No results found.  Procedures Procedures (including critical care time)  Medications Ordered in UC Medications - No data to display  Initial Impression / Assessment and Plan / UC Course  I have reviewed the triage vital signs and the nursing notes.  Pertinent labs & imaging results that were available during my care of the patient were reviewed by me and considered in my medical decision making (see chart for details).     Reviewed exam and symptoms with patient.  No red flags.  Discussed keloid.  Will start mupirocin topically.  Given presentation and exam I do not feel it needs oral antibiotics at this time.  Will provide contact information for dermatology as she is interested in having it removed.  Signs and symptoms of infection/return precautions reviewed.  Patient should follow-up with PCP in 2 days for recheck. Final Clinical Impressions(s) / UC Diagnoses   Final diagnoses:  Keloid of skin     Discharge Instructions      Keep the area clean and dry and apply mupirocin topical antibiotic cream to the area 3 times a day for a week.  You can follow-up with dermatology for discussion of possible removal of the keloid.  Please go to the emergency room if you develop any worsening symptoms.  I hope you feel better soon!    ED Prescriptions     Medication Sig Dispense Auth. Provider   mupirocin cream (BACTROBAN) 2 % Apply 1 Application topically 3 (three) times daily for 7 days. 15 g Radford Pax, NP      PDMP not reviewed this encounter.   Radford Pax, NP 09/28/22 [phone]

## 2022-09-28 NOTE — ED Triage Notes (Signed)
Pt presents to UC w/ c/o recurrent abscess on base of neck. Pt states it has been "coming and going" since she was 32 years old. Pt has not established a dermatologist yet. Pt states this morning she woke up and it had grown and is slightly draining.

## 2022-09-29 ENCOUNTER — Other Ambulatory Visit: Payer: Self-pay

## 2022-10-07 ENCOUNTER — Encounter (HOSPITAL_COMMUNITY): Payer: Self-pay

## 2022-10-07 ENCOUNTER — Other Ambulatory Visit (HOSPITAL_COMMUNITY): Payer: Self-pay

## 2022-10-11 ENCOUNTER — Other Ambulatory Visit: Payer: Self-pay | Admitting: Infectious Disease

## 2022-10-11 ENCOUNTER — Other Ambulatory Visit (HOSPITAL_COMMUNITY): Payer: Self-pay

## 2022-10-11 ENCOUNTER — Other Ambulatory Visit: Payer: Self-pay

## 2022-10-11 DIAGNOSIS — B20 Human immunodeficiency virus [HIV] disease: Secondary | ICD-10-CM

## 2022-10-11 MED ORDER — DESCOVY 200-25 MG PO TABS
1.0000 | ORAL_TABLET | Freq: Every day | ORAL | 1 refills | Status: DC
  Filled 2022-10-11 (×2): qty 30, 30d supply, fill #0
  Filled 2022-11-14: qty 30, 30d supply, fill #1

## 2022-10-11 MED ORDER — TIVICAY 50 MG PO TABS
50.0000 mg | ORAL_TABLET | Freq: Every day | ORAL | 1 refills | Status: DC
  Filled 2022-10-11 (×2): qty 30, 30d supply, fill #0
  Filled 2022-11-14: qty 30, 30d supply, fill #1

## 2022-11-04 ENCOUNTER — Other Ambulatory Visit: Payer: Self-pay

## 2022-11-07 ENCOUNTER — Other Ambulatory Visit (HOSPITAL_COMMUNITY): Payer: Self-pay

## 2022-11-14 ENCOUNTER — Other Ambulatory Visit: Payer: Self-pay

## 2022-11-14 NOTE — Progress Notes (Signed)
Specialty Pharmacy Refill Coordination Note  Kelly Hunter is a 32 y.o. female contacted today regarding refills of specialty medication(s) Emtricitabine-Tenofovir Af   Patient requested Delivery   Delivery date: 11/15/22   Verified address: [address] [place] [address]   Medication will be filled on 11/14/22.

## 2022-11-28 ENCOUNTER — Ambulatory Visit (INDEPENDENT_AMBULATORY_CARE_PROVIDER_SITE_OTHER): Payer: 59 | Admitting: Infectious Disease

## 2022-11-28 DIAGNOSIS — Z113 Encounter for screening for infections with a predominantly sexual mode of transmission: Secondary | ICD-10-CM

## 2022-11-28 DIAGNOSIS — B20 Human immunodeficiency virus [HIV] disease: Secondary | ICD-10-CM

## 2022-11-28 DIAGNOSIS — Z79899 Other long term (current) drug therapy: Secondary | ICD-10-CM

## 2022-12-01 NOTE — Progress Notes (Signed)
No show

## 2022-12-05 ENCOUNTER — Other Ambulatory Visit: Payer: Self-pay

## 2022-12-06 ENCOUNTER — Other Ambulatory Visit: Payer: 59

## 2022-12-06 ENCOUNTER — Other Ambulatory Visit: Payer: Self-pay

## 2022-12-06 DIAGNOSIS — Z79899 Other long term (current) drug therapy: Secondary | ICD-10-CM

## 2022-12-06 DIAGNOSIS — B20 Human immunodeficiency virus [HIV] disease: Secondary | ICD-10-CM

## 2022-12-06 DIAGNOSIS — Z113 Encounter for screening for infections with a predominantly sexual mode of transmission: Secondary | ICD-10-CM

## 2022-12-07 ENCOUNTER — Other Ambulatory Visit (HOSPITAL_COMMUNITY): Payer: Self-pay | Admitting: Pharmacy Technician

## 2022-12-07 ENCOUNTER — Other Ambulatory Visit: Payer: Self-pay

## 2022-12-07 ENCOUNTER — Other Ambulatory Visit (HOSPITAL_COMMUNITY): Payer: Self-pay

## 2022-12-07 ENCOUNTER — Other Ambulatory Visit: Payer: Self-pay | Admitting: Infectious Disease

## 2022-12-07 DIAGNOSIS — B20 Human immunodeficiency virus [HIV] disease: Secondary | ICD-10-CM

## 2022-12-07 LAB — T-HELPER CELLS (CD4) COUNT (NOT AT ARMC)
CD4 % Helper T Cell: 54 % (ref 33–65)
CD4 T Cell Abs: 1426 /uL (ref 400–1790)

## 2022-12-07 MED ORDER — TIVICAY 50 MG PO TABS
50.0000 mg | ORAL_TABLET | Freq: Every day | ORAL | 0 refills | Status: DC
  Filled 2022-12-07: qty 30, 30d supply, fill #0

## 2022-12-07 MED ORDER — DESCOVY 200-25 MG PO TABS
1.0000 | ORAL_TABLET | Freq: Every day | ORAL | 0 refills | Status: DC
  Filled 2022-12-07: qty 30, 30d supply, fill #0

## 2022-12-07 NOTE — Progress Notes (Signed)
Specialty Pharmacy Refill Coordination Note  Kelly Hunter is a 32 y.o. female contacted today regarding refills of specialty medication(s) Dolutegravir Sodium and Emtricitabine-Tenofovir Af   Patient requested Delivery   Delivery date: 12/09/22   Verified address: [address] [place], [place] [address]   Medication will be filled on 12/08/22.   Refill Request sent to MD.

## 2022-12-08 LAB — COMPLETE METABOLIC PANEL WITH GFR
AG Ratio: 1.5 (calc) (ref 1.0–2.5)
ALT: 11 U/L (ref 6–29)
AST: 9 U/L — ABNORMAL LOW (ref 10–30)
Albumin: 4.1 g/dL (ref 3.6–5.1)
Alkaline phosphatase (APISO): 42 U/L (ref 31–125)
BUN: 13 mg/dL (ref 7–25)
CO2: 21 mmol/L (ref 20–32)
Calcium: 9.1 mg/dL (ref 8.6–10.2)
Chloride: 103 mmol/L (ref 98–110)
Creat: 0.79 mg/dL (ref 0.50–0.97)
Globulin: 2.8 g/dL (ref 1.9–3.7)
Glucose, Bld: 296 mg/dL — ABNORMAL HIGH (ref 65–99)
Potassium: 4.4 mmol/L (ref 3.5–5.3)
Sodium: 136 mmol/L (ref 135–146)
Total Bilirubin: 0.3 mg/dL (ref 0.2–1.2)
Total Protein: 6.9 g/dL (ref 6.1–8.1)
eGFR: 103 mL/min/{1.73_m2} (ref >=60)

## 2022-12-08 LAB — LIPID PANEL
Cholesterol: 160 mg/dL (ref <200)
HDL: 56 mg/dL (ref >=50)
LDL Cholesterol (Calc): 77 mg/dL
Non-HDL Cholesterol (Calc): 104 mg/dL (ref <130)
Total CHOL/HDL Ratio: 2.9 (calc) (ref <5.0)
Triglycerides: 176 mg/dL — ABNORMAL HIGH (ref <150)

## 2022-12-08 LAB — CBC WITH DIFFERENTIAL/PLATELET
Absolute Lymphocytes: 3067 {cells}/uL (ref 850–3900)
Absolute Monocytes: 441 {cells}/uL (ref 200–950)
Basophils Absolute: 49 {cells}/uL (ref 0–200)
Basophils Relative: 0.5 %
Eosinophils Absolute: 127 {cells}/uL (ref 15–500)
Eosinophils Relative: 1.3 %
HCT: 36.7 % (ref 35.0–45.0)
Hemoglobin: 12.1 g/dL (ref 11.7–15.5)
MCH: 28.1 pg (ref 27.0–33.0)
MCHC: 33 g/dL (ref 32.0–36.0)
MCV: 85.3 fL (ref 80.0–100.0)
MPV: 10.5 fL (ref 7.5–12.5)
Monocytes Relative: 4.5 %
Neutro Abs: 6115 {cells}/uL (ref 1500–7800)
Neutrophils Relative %: 62.4 %
Platelets: 378 10*3/uL (ref 140–400)
RBC: 4.3 10*6/uL (ref 3.80–5.10)
RDW: 14.2 % (ref 11.0–15.0)
Total Lymphocyte: 31.3 %
WBC: 9.8 10*3/uL (ref 3.8–10.8)

## 2022-12-08 LAB — HIV-1 RNA QUANT-NO REFLEX-BLD
HIV 1 RNA Quant: <20 {copies}/mL — ABNORMAL HIGH
HIV-1 RNA Quant, Log: <1.3 {Log_copies}/mL — ABNORMAL HIGH

## 2022-12-08 LAB — RPR: RPR Ser Ql: NONREACTIVE

## 2022-12-20 ENCOUNTER — Ambulatory Visit
Admission: EM | Admit: 2022-12-20 | Discharge: 2022-12-20 | Disposition: A | Discharge status: Home / Self Care | Payer: 59 | Attending: Internal Medicine | Admitting: Internal Medicine

## 2022-12-20 DIAGNOSIS — B9689 Other specified bacterial agents as the cause of diseases classified elsewhere: Secondary | ICD-10-CM | POA: Insufficient documentation

## 2022-12-20 DIAGNOSIS — N760 Acute vaginitis: Secondary | ICD-10-CM | POA: Diagnosis present

## 2022-12-20 MED ORDER — METRONIDAZOLE 500 MG PO TABS: 500.0000 mg | ORAL_TABLET | Freq: Two times a day (BID) | ORAL | 0 refills | Status: DC

## 2022-12-20 MED ORDER — FLUCONAZOLE 150 MG PO TABS: 150.0000 mg | ORAL_TABLET | ORAL | 0 refills | Status: AC

## 2022-12-20 NOTE — ED Provider Notes (Signed)
[facility]  Note:  This document was prepared using Conservation officer, historic buildings and may include unintentional dictation errors.  MRN: [id number] DOB: 1990/12/28  Subjective:   Kelly Hunter is a 32 y.o. female with PMH of HIV, genital herpes, type 2 diabetes presenting for 4-day history of acute onset vaginal discharge, vaginal irritation and itching.  Denies fever, n/v, abdominal pain, pelvic pain, rashes, dysuria, urinary frequency, hematuria.  Last yeast infection she had was a while back.  Cannot recall last time she had bacterial vaginosis.  No current facility-administered medications for this encounter.  Current Outpatient Medications:    acetaminophen (TYLENOL) 500 MG tablet, Take 2 tablets (1,000 mg total) by mouth every 6 (six) hours., Disp: 30 tablet, Rfl: 0   clotrimazole (LOTRIMIN) 1 % cream, Apply to affected area 2 times daily, Disp: 15 g, Rfl: 0   dolutegravir (TIVICAY) 50 MG tablet, Take 1 tablet (50 mg total) by mouth daily., Disp: 30 tablet, Rfl: 0   emtricitabine-tenofovir AF (DESCOVY) 200-25 MG tablet, Take 1 tablet by mouth daily., Disp: 30 tablet, Rfl: 0   fluconazole (DIFLUCAN) 150 MG tablet, Take 1 tablet (150 mg total) by mouth every 3 (three) days., Disp: 2 tablet, Rfl: 0   ibuprofen (ADVIL) 600 MG tablet, Take 1 tablet (600 mg total) by mouth every 6 (six) hours., Disp: 30 tablet, Rfl: 0   insulin lispro (HUMALOG) 100 UNIT/ML injection, Inject 2 Units into the skin 3 (three) times daily before meals. Sliding scale, Disp: , Rfl:    insulin NPH Human (NOVOLIN N) 100 UNIT/ML injection, Inject 8 Units into the skin 2 (two) times daily., Disp: , Rfl:    NIFEdipine (PROCARDIA XL/NIFEDICAL-XL) 90 MG 24 hr tablet, Take 1 tablet (90 mg total) by mouth daily., Disp: 30 tablet, Rfl: 1   valACYclovir (VALTREX) 1000 MG tablet, Take 1 tablet by mouth once daily., Disp: 30 tablet, Rfl: 0   Allergies  Allergen Reactions   Cashew Nut Oil      Pt states she is allergic to cashews and to all nuts   Other Nausea And Vomiting and Swelling    SWELLING REACTION UNSPECIFIED  Tree Nuts (Almonds)   Peanut Oil Nausea And Vomiting and Swelling    SWELLING REACTION UNSPECIFIED    Peanut-Containing Drug Products Nausea And Vomiting and Swelling    SWELLING REACTION UNSPECIFIED    Pyridium [Phenazopyridine Hcl] Nausea And Vomiting   Bactrim [Sulfamethoxazole-Trimethoprim] Rash    Past Medical History:  Diagnosis Date   Acne    Allergy    Allegra   Asthma    mild; onset in childhood.  No hospitalizations.   ASTHMA, EXERCISE INDUCED    Cervical high risk HPV (human papillomavirus) test positive 07/2019   cervical cancer screening due in December, 2021   Chlamydia 03/03/2012   CIN I (cervical intraepithelial neoplasia I) 08/31/2013, 05/01/17   colposcopy by Lily Peer; s/p CO2 laser treatment. repeat colposcopy by Dr. Edward Jolly.   Condyloma acuminatum of vulva 06/18/2013   Dysplasia of cervix, low grade (CIN 1) 06/18/2013   Eczema    Elevated blood sugar 12/25/2017   Encounter for induction of labor 06/17/2020   Genital warts 08/31/2013   Aldara treatment; Lily Peer.   Gestational diabetes    Grieving 12/25/2017   Herpes    Hidradenitis    Hidradenitis axillaris 05/21/2014   HIV (human immunodeficiency virus infection) (HCC)    HIV (human immunodeficiency virus) risk factors complicating pregnancy 11/25/2019   HSV-2  seropositive    Maternal obesity affecting pregnancy, antepartum 03/30/2006   Qualifier: Diagnosis of   By: Levada Schilling       Migraine headache without aura    Mixed anxiety depressive disorder 06/16/2020   Moderate episode of recurrent major depressive disorder (HCC) 10/24/2018   Pregnancy induced hypertension    Severe preeclampsia 06/17/2020   Substance abuse (HCC)    Type 2 diabetes mellitus without complication, without long-term current use of insulin (HCC) 11/17/2014   Vaccine counseling 01/22/2020   Vaginal  Pap smear, abnormal    VIN I (vulvar intraepithelial neoplasia I) 08/31/2013   s/p CO2 treatment; Fernandez/gyn.     Past Surgical History:  Procedure Laterality Date   BREAST SURGERY  02/01/2008   Reduction   CESAREAN SECTION N/A 06/18/2020   Procedure: CESAREAN SECTION;  Surgeon: Essie Hart, MD;  [facility]: [facility];  Service: Obstetrics;  Laterality: N/A;   CESAREAN SECTION N/A 04/13/2022   Procedure: CESAREAN SECTION;  Surgeon: Hoover Browns, MD;  [facility]: [facility];  Service: Obstetrics;  Laterality: N/A;   CO2 LASER APPLICATION N/A 07/18/2013   Procedure: C02 laser of vagina and cervix;  Surgeon: Ok Edwards, MD;  Location: [facility];  Service: Gynecology;  Laterality: N/A;   INCISION AND DRAINAGE ABSCESS Left 03/09/2017   Procedure: INCISION AND DRAINAGE ABSCESS LEFT BREAST ABSCESS;  Surgeon: Harriette Bouillon, MD;  Location: [facility] OR;  Service: General;  Laterality: Left;    Family History  Problem Relation Age of Onset   Diabetes Mother    Hypertension Mother    Hyperlipidemia Mother    Mental illness Mother    Pulmonary embolism Mother    Cancer Paternal Aunt        Cervical or Ovarian per patient   Cancer Maternal Grandfather        ?   Heart disease Neg Hx    Asthma Neg Hx     Social History   Tobacco Use   Smoking status: Never   Smokeless tobacco: Never  Vaping Use   Vaping status: Never Used  Substance Use Topics   Alcohol use: Yes    Comment: occasionally   Drug use: Not Currently    Types: Marijuana    ROS   Objective:   Vitals: BP (!) 138/90 (BP Location: Right Arm)   Pulse 97   Temp 98.1 F (36.7 C) (Oral)   Resp 20   LMP 11/07/2022   SpO2 96%   Physical Exam Constitutional:      General: She is not in acute distress.    Appearance: Normal appearance. She is well-developed. She is not ill-appearing, toxic-appearing or diaphoretic.  HENT:     Head: Normocephalic and atraumatic.     Nose: Nose normal.     Mouth/Throat:     Mouth: Mucous  membranes are moist.     Pharynx: Oropharynx is clear.  Eyes:     General: No scleral icterus.       Right eye: No discharge.        Left eye: No discharge.     Extraocular Movements: Extraocular movements intact.     Conjunctiva/sclera: Conjunctivae normal.  Cardiovascular:     Rate and Rhythm: Normal rate.  Pulmonary:     Effort: Pulmonary effort is normal.  Abdominal:     General: Bowel sounds are normal. There is no distension.     Palpations: Abdomen is soft. There is no mass.     Tenderness: There is  no abdominal tenderness. There is no right CVA tenderness, left CVA tenderness, guarding or rebound.  Skin:    General: Skin is warm and dry.  Neurological:     General: No focal deficit present.     Mental Status: She is alert and oriented to person, place, and time.  Psychiatric:        Mood and Affect: Mood normal.        Behavior: Behavior normal.        Thought Content: Thought content normal.        Judgment: Judgment normal.     Assessment and Plan :   PDMP not reviewed this encounter.  1. Bacterial vaginosis   2. Acute vaginitis    We will treat patient empirically for bacterial vaginosis with Flagyl and for yeast vaginitis with fluconazole.  Labs pending. Counseled patient on potential for adverse effects with medications prescribed/recommended today, ER and return-to-clinic precautions discussed, patient verbalized understanding.    Wallis Bamberg, [place] 12/20/22 [phone]

## 2022-12-20 NOTE — ED Triage Notes (Signed)
Pt c/o vaginal d/c, itching and irritation x 4 days-NAD-steady gait

## 2022-12-21 LAB — CERVICOVAGINAL ANCILLARY ONLY
Bacterial Vaginitis (gardnerella): NEGATIVE
Candida Glabrata: NEGATIVE
Candida Vaginitis: POSITIVE — AB
Chlamydia: NEGATIVE
Comment: NEGATIVE
Comment: NEGATIVE
Comment: NEGATIVE
Comment: NEGATIVE
Comment: NEGATIVE
Comment: NORMAL
Neisseria Gonorrhea: NEGATIVE
Trichomonas: NEGATIVE

## 2022-12-26 NOTE — Progress Notes (Unsigned)
Virtual Visit via Video Note  I connected with Kelly Hunter on 12/27/22 at 11:00 AM EST by a video enabled telemedicine application and verified that I am speaking with the correct person using two identifiers.  Location: Patient: Home Provider: [facility]   I discussed the limitations of evaluation and management by telemedicine and the availability of in person appointments. The patient expressed understanding and agreed to proceed.  History of Present Illness:  32 year old woman with HIV, perfectly controlled on Tivicay and Descovy after switch from [place] while pregnant. She has had a C section since I last saw her and had another child. I am not sure if she is still breast--feeding an issue that was raised when I went to rx a statin for her.  She has regular followup with her Ob/Gyn re her VIN.   Past Medical History:  Diagnosis Date   Acne    Allergy    Allegra   Asthma    mild; onset in childhood.  No hospitalizations.   ASTHMA, EXERCISE INDUCED    Cervical high risk HPV (human papillomavirus) test positive 07/2019   cervical cancer screening due in December, 2021   Chlamydia 03/03/2012   CIN I (cervical intraepithelial neoplasia I) 08/31/2013, 05/01/17   colposcopy by Lily Peer; s/p CO2 laser treatment. repeat colposcopy by Dr. Edward Jolly.   Condyloma acuminatum of vulva 06/18/2013   Dysplasia of cervix, low grade (CIN 1) 06/18/2013   Eczema    Elevated blood sugar 12/25/2017   Encounter for induction of labor 06/17/2020   Genital warts 08/31/2013   Aldara treatment; Lily Peer.   Gestational diabetes    Grieving 12/25/2017   Herpes    Hidradenitis    Hidradenitis axillaris 05/21/2014   HIV (human immunodeficiency virus infection) (HCC)    HIV (human immunodeficiency virus) risk factors complicating pregnancy 11/25/2019   HSV-2 seropositive    Maternal obesity affecting pregnancy, antepartum 03/30/2006   Qualifier: Diagnosis of   By: Levada Schilling       Migraine  headache without aura    Mixed anxiety depressive disorder 06/16/2020   Moderate episode of recurrent major depressive disorder (HCC) 10/24/2018   Pregnancy induced hypertension    Severe preeclampsia 06/17/2020   Substance abuse (HCC)    Type 2 diabetes mellitus without complication, without long-term current use of insulin (HCC) 11/17/2014   Vaccine counseling 01/22/2020   Vaginal Pap smear, abnormal    VIN I (vulvar intraepithelial neoplasia I) 08/31/2013   s/p CO2 treatment; Fernandez/gyn.    Past Surgical History:  Procedure Laterality Date   BREAST SURGERY  02/01/2008   Reduction   CESAREAN SECTION N/A 06/18/2020   Procedure: CESAREAN SECTION;  Surgeon: Essie Hart, MD;  [facility];  Service: Obstetrics;  Laterality: N/A;   CESAREAN SECTION N/A 04/13/2022   Procedure: CESAREAN SECTION;  Surgeon: Hoover Browns, MD;  [facility];  Service: Obstetrics;  Laterality: N/A;   CO2 LASER APPLICATION N/A 07/18/2013   Procedure: C02 laser of vagina and cervix;  Surgeon: Ok Edwards, MD;  Location: [facility];  Service: Gynecology;  Laterality: N/A;   INCISION AND DRAINAGE ABSCESS Left 03/09/2017   Procedure: INCISION AND DRAINAGE ABSCESS LEFT BREAST ABSCESS;  Surgeon: Harriette Bouillon, MD;  Location: [facility];  Service: General;  Laterality: Left;    Family History  Problem Relation Age of Onset   Diabetes Mother    Hypertension Mother    Hyperlipidemia Mother    Mental illness Mother  Pulmonary embolism Mother    Cancer Paternal Aunt        Cervical or Ovarian per patient   Cancer Maternal Grandfather        ?   Heart disease Neg Hx    Asthma Neg Hx       Social History   Socioeconomic History   Marital status: Single    Spouse name: n/a   Number of children: 0   Years of education: Not on file   Highest education level: Not on file  Occupational History   Occupation: Production designer, theatre/television/film    Comment: [employer]  Tobacco Use   Smoking status: Never   Smokeless tobacco: Never   Vaping Use   Vaping status: Never Used  Substance and Sexual Activity   Alcohol use: Not Currently    Comment: occasionally   Drug use: Not Currently    Types: Marijuana   Sexual activity: Yes    Partners: Male    Birth control/protection: Inserts  Other Topics Concern   Not on file  Social History Narrative   Marital status: single;  Not dating in 2016.      Children: none; never pregnant.      Lives: with mom; dad in [place].     Employment: works full time at [employer] as Production designer, theatre/television/film; also working at [employer] as Production designer, theatre/television/film.      Education: previously in school; took medical leave in 02/2012; [employer] in [place].      Tobacco: vapor cigarette two months. Pt does not smoke.       Alcohol:  Socially; weekends; no DUIs.      Drugs: marijuana daily.  Started at age 52.        Sexual activity: sexually active; 25 total partners; males; Chlamydia in 03/2012.  Genital warts 08/2013.  HIV diagnosis 12/2013.      Seatbelt: 100%      Guns:  none   Social Determinants of Corporate investment banker Strain: Not on file  Food Insecurity: No Food Insecurity (04/13/2022)   Hunger Vital Sign    Worried About Running Out of Food in the Last Year: Never true    Ran Out of Food in the Last Year: Never true  Transportation Needs: No Transportation Needs (04/13/2022)   PRAPARE - Administrator, Civil Service (Medical): No    Lack of Transportation (Non-Medical): No  Physical Activity: Not on file  Stress: Not on file  Social Connections: Not on file    Allergies  Allergen Reactions   Cashew Nut Oil     Pt states she is allergic to cashews and to all nuts   Other Nausea And Vomiting and Swelling    SWELLING REACTION UNSPECIFIED  Tree Nuts (Almonds)   Peanut Oil Nausea And Vomiting and Swelling    SWELLING REACTION UNSPECIFIED    Peanut-Containing Drug Products Nausea And Vomiting and Swelling    SWELLING REACTION UNSPECIFIED    Pyridium [Phenazopyridine Hcl] Nausea And Vomiting   Bactrim  [Sulfamethoxazole-Trimethoprim] Rash     Current Outpatient Medications:    bictegravir-emtricitabine-tenofovir AF (BIKTARVY) 50-200-25 MG TABS tablet, Take 1 tablet by mouth daily., Disp: 30 tablet, Rfl: 11   acetaminophen (TYLENOL) 500 MG tablet, Take 2 tablets (1,000 mg total) by mouth every 6 (six) hours., Disp: 30 tablet, Rfl: 0   clotrimazole (LOTRIMIN) 1 % cream, Apply to affected area 2 times daily, Disp: 15 g, Rfl: 0   fluconazole (DIFLUCAN) 150 MG tablet, Take 1 tablet (  150 mg total) by mouth every 3 (three) days., Disp: 5 tablet, Rfl: 0   insulin lispro (HUMALOG) 100 UNIT/ML injection, Inject 2 Units into the skin 3 (three) times daily before meals. Sliding scale, Disp: , Rfl:    insulin NPH Human (NOVOLIN N) 100 UNIT/ML injection, Inject 8 Units into the skin 2 (two) times daily., Disp: , Rfl:    metroNIDAZOLE (FLAGYL) 500 MG tablet, Take 1 tablet (500 mg total) by mouth 2 (two) times daily with a meal. DO NOT CONSUME ALCOHOL WHILE TAKING THIS MEDICATION., Disp: 14 tablet, Rfl: 0   NIFEdipine (PROCARDIA XL/NIFEDICAL-XL) 90 MG 24 hr tablet, Take 1 tablet (90 mg total) by mouth daily., Disp: 30 tablet, Rfl: 1   valACYclovir (VALTREX) 1000 MG tablet, Take 1 tablet by mouth once daily., Disp: 30 tablet, Rfl: 0    Past Medical History:  Diagnosis Date   Acne    Allergy    Allegra   Asthma    mild; onset in childhood.  No hospitalizations.   ASTHMA, EXERCISE INDUCED    Cervical high risk HPV (human papillomavirus) test positive 07/2019   cervical cancer screening due in December, 2021   Chlamydia 03/03/2012   CIN I (cervical intraepithelial neoplasia I) 08/31/2013, 05/01/17   colposcopy by Lily Peer; s/p CO2 laser treatment. repeat colposcopy by Dr. Edward Jolly.   Condyloma acuminatum of vulva 06/18/2013   Dysplasia of cervix, low grade (CIN 1) 06/18/2013   Eczema    Elevated blood sugar 12/25/2017   Encounter for induction of labor 06/17/2020   Genital warts 08/31/2013   Aldara  treatment; Lily Peer.   Gestational diabetes    Grieving 12/25/2017   Herpes    Hidradenitis    Hidradenitis axillaris 05/21/2014   HIV (human immunodeficiency virus infection) (HCC)    HIV (human immunodeficiency virus) risk factors complicating pregnancy 11/25/2019   HSV-2 seropositive    Maternal obesity affecting pregnancy, antepartum 03/30/2006   Qualifier: Diagnosis of   By: Levada Schilling       Migraine headache without aura    Mixed anxiety depressive disorder 06/16/2020   Moderate episode of recurrent major depressive disorder (HCC) 10/24/2018   Pregnancy induced hypertension    Severe preeclampsia 06/17/2020   Substance abuse (HCC)    Type 2 diabetes mellitus without complication, without long-term current use of insulin (HCC) 11/17/2014   Vaccine counseling 01/22/2020   Vaginal Pap smear, abnormal    VIN I (vulvar intraepithelial neoplasia I) 08/31/2013   s/p CO2 treatment; Fernandez/gyn.    Past Surgical History:  Procedure Laterality Date   BREAST SURGERY  02/01/2008   Reduction   CESAREAN SECTION N/A 06/18/2020   Procedure: CESAREAN SECTION;  Surgeon: Essie Hart, MD;  [facility];  Service: Obstetrics;  Laterality: N/A;   CESAREAN SECTION N/A 04/13/2022   Procedure: CESAREAN SECTION;  Surgeon: Hoover Browns, MD;  [facility];  Service: Obstetrics;  Laterality: N/A;   CO2 LASER APPLICATION N/A 07/18/2013   Procedure: C02 laser of vagina and cervix;  Surgeon: Ok Edwards, MD;  Location: [facility];  Service: Gynecology;  Laterality: N/A;   INCISION AND DRAINAGE ABSCESS Left 03/09/2017   Procedure: INCISION AND DRAINAGE ABSCESS LEFT BREAST ABSCESS;  Surgeon: Harriette Bouillon, MD;  Location: [facility];  Service: General;  Laterality: Left;    Family History  Problem Relation Age of Onset   Diabetes Mother    Hypertension Mother    Hyperlipidemia Mother    Mental illness Mother  Pulmonary embolism Mother    Cancer Paternal Aunt        Cervical or Ovarian per  patient   Cancer Maternal Grandfather        ?   Heart disease Neg Hx    Asthma Neg Hx       Social History   Socioeconomic History   Marital status: Single    Spouse name: n/a   Number of children: 0   Years of education: Not on file   Highest education level: Not on file  Occupational History   Occupation: Production designer, theatre/television/film    Comment: [employer]  Tobacco Use   Smoking status: Never   Smokeless tobacco: Never  Vaping Use   Vaping status: Never Used  Substance and Sexual Activity   Alcohol use: Not Currently    Comment: occasionally   Drug use: Not Currently    Types: Marijuana   Sexual activity: Yes    Partners: Male    Birth control/protection: Inserts  Other Topics Concern   Not on file  Social History Narrative   Marital status: single;  Not dating in 2016.      Children: none; never pregnant.      Lives: with mom; dad in [place].     Employment: works full time at [employer] as Production designer, theatre/television/film; also working at [employer] as Production designer, theatre/television/film.      Education: previously in school; took medical leave in 02/2012; [employer] in [place].      Tobacco: vapor cigarette two months. Pt does not smoke.       Alcohol:  Socially; weekends; no DUIs.      Drugs: marijuana daily.  Started at age 77.        Sexual activity: sexually active; 25 total partners; males; Chlamydia in 03/2012.  Genital warts 08/2013.  HIV diagnosis 12/2013.      Seatbelt: 100%      Guns:  none   Social Determinants of Corporate investment banker Strain: Not on file  Food Insecurity: No Food Insecurity (04/13/2022)   Hunger Vital Sign    Worried About Running Out of Food in the Last Year: Never true    Ran Out of Food in the Last Year: Never true  Transportation Needs: No Transportation Needs (04/13/2022)   PRAPARE - Administrator, Civil Service (Medical): No    Lack of Transportation (Non-Medical): No  Physical Activity: Not on file  Stress: Not on file  Social Connections: Not on file    Allergies  Allergen Reactions    Cashew Nut Oil     Pt states she is allergic to cashews and to all nuts   Other Nausea And Vomiting and Swelling    SWELLING REACTION UNSPECIFIED  Tree Nuts (Almonds)   Peanut Oil Nausea And Vomiting and Swelling    SWELLING REACTION UNSPECIFIED    Peanut-Containing Drug Products Nausea And Vomiting and Swelling    SWELLING REACTION UNSPECIFIED    Pyridium [Phenazopyridine Hcl] Nausea And Vomiting   Bactrim [Sulfamethoxazole-Trimethoprim] Rash     Current Outpatient Medications:    bictegravir-emtricitabine-tenofovir AF (BIKTARVY) 50-200-25 MG TABS tablet, Take 1 tablet by mouth daily., Disp: 30 tablet, Rfl: 11   acetaminophen (TYLENOL) 500 MG tablet, Take 2 tablets (1,000 mg total) by mouth every 6 (six) hours., Disp: 30 tablet, Rfl: 0   clotrimazole (LOTRIMIN) 1 % cream, Apply to affected area 2 times daily, Disp: 15 g, Rfl: 0   fluconazole (DIFLUCAN) 150 MG tablet, Take 1 tablet (  150 mg total) by mouth every 3 (three) days., Disp: 5 tablet, Rfl: 0   insulin lispro (HUMALOG) 100 UNIT/ML injection, Inject 2 Units into the skin 3 (three) times daily before meals. Sliding scale, Disp: , Rfl:    insulin NPH Human (NOVOLIN N) 100 UNIT/ML injection, Inject 8 Units into the skin 2 (two) times daily., Disp: , Rfl:    metroNIDAZOLE (FLAGYL) 500 MG tablet, Take 1 tablet (500 mg total) by mouth 2 (two) times daily with a meal. DO NOT CONSUME ALCOHOL WHILE TAKING THIS MEDICATION., Disp: 14 tablet, Rfl: 0   NIFEdipine (PROCARDIA XL/NIFEDICAL-XL) 90 MG 24 hr tablet, Take 1 tablet (90 mg total) by mouth daily., Disp: 30 tablet, Rfl: 1   valACYclovir (VALTREX) 1000 MG tablet, Take 1 tablet by mouth once daily., Disp: 30 tablet, Rfl: 0   12 point ROS is negative   Observations/Objective:  Kelly appeared well on video feed and in NAD comfortable when talking to me on video feed and with her children on feed as well.   Assessment and Plan:   HIV disease:  I have reviewed Kelly Hunter's  labs including viral load which was  Lab Results  Component Value Date   HIV1RNAQUANT <20 (H) 12/06/2022   and cd4 which was  Lab Results  Component Value Date   CD4TABS 1,426 12/06/2022     I am switch her back to [facility] and check labs at visit in 2 months time when she comes for  in person visit  DM: she is on insulin   Hyperlipidemia: would like to start her back on a statin (crestor) but Epic is not sure--nor am I if she is breast feeding at this point or not in which case this statin would be contraindicated  VIN to followup with Ob/Gyn  Vaccine counseling; recommend she get vaccinated vs Flu and COVID.   Follow Up Instructions:    I discussed the assessment and treatment plan with the patient. The patient was provided an opportunity to ask questions and all were answered. The patient agreed with the plan and demonstrated an understanding of the instructions.   The patient was advised to call back or seek an in-person evaluation if the symptoms worsen or if the condition fails to improve as anticipated.  I provided 27 \ minutes of non-face-to-face time during this encounter.   Acey Lav, MD

## 2022-12-27 ENCOUNTER — Other Ambulatory Visit: Payer: Self-pay

## 2022-12-27 ENCOUNTER — Telehealth: Payer: 59 | Admitting: Infectious Disease

## 2022-12-27 DIAGNOSIS — B20 Human immunodeficiency virus [HIV] disease: Secondary | ICD-10-CM | POA: Diagnosis not present

## 2022-12-27 DIAGNOSIS — E785 Hyperlipidemia, unspecified: Secondary | ICD-10-CM

## 2022-12-27 DIAGNOSIS — Z7185 Encounter for immunization safety counseling: Secondary | ICD-10-CM

## 2022-12-27 DIAGNOSIS — E1169 Type 2 diabetes mellitus with other specified complication: Secondary | ICD-10-CM

## 2022-12-27 DIAGNOSIS — N900 Mild vulvar dysplasia: Secondary | ICD-10-CM | POA: Diagnosis not present

## 2022-12-27 MED ORDER — BIKTARVY 50-200-25 MG PO TABS
1.0000 | ORAL_TABLET | Freq: Every day | ORAL | 11 refills | Status: DC
  Filled 2022-12-28 (×2): qty 30, 30d supply, fill #0
  Filled 2023-01-23: qty 30, 30d supply, fill #1
  Filled 2023-01-30 – 2023-02-22 (×2): qty 30, 30d supply, fill #2
  Filled 2023-03-22: qty 30, 30d supply, fill #3
  Filled 2023-04-26: qty 30, 30d supply, fill #4
  Filled 2023-06-29 (×2): qty 30, 30d supply, fill #5
  Filled 2023-07-24 – 2023-08-08 (×2): qty 30, 30d supply, fill #6
  Filled 2023-09-04: qty 30, 30d supply, fill #7
  Filled 2023-10-04: qty 30, 30d supply, fill #8
  Filled 2023-11-01 – 2023-11-23 (×2): qty 30, 30d supply, fill #9
  Filled 2023-12-14 – 2023-12-18 (×2): qty 30, 30d supply, fill #10

## 2022-12-28 ENCOUNTER — Other Ambulatory Visit: Payer: Self-pay

## 2022-12-28 ENCOUNTER — Other Ambulatory Visit: Payer: Self-pay | Admitting: Infectious Disease

## 2022-12-28 ENCOUNTER — Other Ambulatory Visit (HOSPITAL_COMMUNITY): Payer: Self-pay

## 2022-12-28 MED ORDER — ROSUVASTATIN CALCIUM 20 MG PO TABS
20.0000 mg | ORAL_TABLET | Freq: Every day | ORAL | 11 refills | Status: AC
  Filled 2022-12-28 – 2023-01-03 (×2): qty 30, 30d supply, fill #0

## 2022-12-28 NOTE — Progress Notes (Signed)
Specialty Pharmacy Initial Fill Coordination Note  Kelly Hunter is a 32 y.o. female contacted today regarding initial fill of specialty medication(s) Bictegravir-Emtricitab-Tenofov   Patient requested Delivery   Delivery date: 01/03/23   Verified address: [address] [place] [address]   Medication will be filled on 01/02/23.   Patient is aware of $0 copayment.

## 2022-12-28 NOTE — Progress Notes (Signed)
Spoke with pt who denies breast feeding.

## 2023-01-02 ENCOUNTER — Other Ambulatory Visit (HOSPITAL_COMMUNITY): Payer: Self-pay

## 2023-01-02 ENCOUNTER — Other Ambulatory Visit: Payer: Self-pay

## 2023-01-02 ENCOUNTER — Other Ambulatory Visit: Payer: Self-pay | Admitting: Obstetrics & Gynecology

## 2023-01-02 ENCOUNTER — Other Ambulatory Visit: Payer: Self-pay | Admitting: Infectious Disease

## 2023-01-02 DIAGNOSIS — A6000 Herpesviral infection of urogenital system, unspecified: Secondary | ICD-10-CM

## 2023-01-02 MED ORDER — VALACYCLOVIR HCL 1 G PO TABS
1000.0000 mg | ORAL_TABLET | Freq: Every day | ORAL | 5 refills | Status: AC
  Filled 2023-01-02: qty 30, 30d supply, fill #0

## 2023-01-03 ENCOUNTER — Other Ambulatory Visit: Payer: Self-pay

## 2023-01-03 ENCOUNTER — Other Ambulatory Visit (HOSPITAL_COMMUNITY): Payer: Self-pay

## 2023-01-05 LAB — SURGICAL PATHOLOGY

## 2023-01-19 ENCOUNTER — Other Ambulatory Visit: Payer: Self-pay

## 2023-01-23 ENCOUNTER — Other Ambulatory Visit: Payer: Self-pay

## 2023-01-23 NOTE — Progress Notes (Signed)
Specialty Pharmacy Refill Coordination Note  Kelly Hunter is a 32 y.o. female contacted today regarding refills of specialty medication(s) Bictegravir-Emtricitab-Tenofov Musician)   Patient requested (Patient-Rptd) Delivery   Delivery date: (Patient-Rptd) 01/27/23   Verified address: (Patient-Rptd) [address] [place], [place] [address]   Medication will be filled on 12.26.24.

## 2023-01-30 ENCOUNTER — Other Ambulatory Visit: Payer: Self-pay

## 2023-02-10 ENCOUNTER — Encounter: Payer: Self-pay | Admitting: Emergency Medicine

## 2023-02-10 ENCOUNTER — Ambulatory Visit
Admission: EM | Admit: 2023-02-10 | Discharge: 2023-02-10 | Disposition: A | Discharge status: Home / Self Care | Payer: 59 | Attending: Family Medicine | Admitting: Family Medicine

## 2023-02-10 DIAGNOSIS — J209 Acute bronchitis, unspecified: Secondary | ICD-10-CM | POA: Diagnosis not present

## 2023-02-10 MED ORDER — AMOXICILLIN-POT CLAVULANATE 875-125 MG PO TABS: 1.0000 | ORAL_TABLET | Freq: Two times a day (BID) | ORAL | 0 refills | Status: AC

## 2023-02-10 MED ORDER — PROMETHAZINE-DM 6.25-15 MG/5ML PO SYRP: 5.0000 mL | ORAL_SOLUTION | Freq: Four times a day (QID) | ORAL | 0 refills | Status: AC | PRN

## 2023-02-10 NOTE — ED Triage Notes (Addendum)
 Pt c/o cough and chest congestion for 1.5 week

## 2023-02-10 NOTE — Discharge Instructions (Addendum)
 If symptoms worsen fill the antibiotic as written Follow-up with your doctor

## 2023-02-10 NOTE — ED Provider Notes (Signed)
 [facility]    CSN: [id number] Arrival date & time: 02/10/23  1104      History   Chief Complaint Chief Complaint  Patient presents with   Cough    HPI Kelly Hunter is a 33 y.o. female.   The history is provided by the patient.  Cough Associated symptoms: rhinorrhea   Associated symptoms: no fever, no shortness of breath and no sore throat   Sick for 1-1/2 weeks symptoms include rhinorrhea, nasal congestion, cough with occasional green sputum.  Denies fever, chills, sweats, body aches, fatigue, chest pain, shortness of breath, ear pain, headache.  Denies vomiting, diarrhea, rash or skin changes.  Admits household contacts with similar symptoms.  Denies sinus pain or pressure Past Medical History:  Diagnosis Date   Acne    Allergy    Allegra   Asthma    mild; onset in childhood.  No hospitalizations.   ASTHMA, EXERCISE INDUCED    Cervical high risk HPV (human papillomavirus) test positive 07/2019   cervical cancer screening due in December, 2021   Chlamydia 03/03/2012   CIN I (cervical intraepithelial neoplasia I) 08/31/2013, 05/01/17   colposcopy by Winfred; s/p CO2 laser treatment. repeat colposcopy by Dr. Nikki.   Condyloma acuminatum of vulva 06/18/2013   Dysplasia of cervix, low grade (CIN 1) 06/18/2013   Eczema    Elevated blood sugar 12/25/2017   Encounter for induction of labor 06/17/2020   Genital warts 08/31/2013   Aldara  treatment; Winfred.   Gestational diabetes    Grieving 12/25/2017   Herpes    Hidradenitis    Hidradenitis axillaris 05/21/2014   HIV (human immunodeficiency virus infection) (HCC)    HIV (human immunodeficiency virus) risk factors complicating pregnancy 11/25/2019   HSV-2 seropositive    Maternal obesity affecting pregnancy, antepartum 03/30/2006   Qualifier: Diagnosis of   By: WATT, JOANNE       Migraine headache without aura    Mixed anxiety depressive disorder 06/16/2020   Moderate episode of recurrent major  depressive disorder (HCC) 10/24/2018   Pregnancy induced hypertension    Severe preeclampsia 06/17/2020   Substance abuse (HCC)    Type 2 diabetes mellitus without complication, without long-term current use of insulin  (HCC) 11/17/2014   Vaccine counseling 01/22/2020   Vaginal Pap smear, abnormal    VIN I (vulvar intraepithelial neoplasia I) 08/31/2013   s/p CO2 treatment; Fernandez/gyn.    Patient Active Problem List   Diagnosis Date Noted   Preeclampsia 04/16/2022   Previous cesarean section 04/13/2022   Postpartum care following cesarean delivery 04/13/2022   Major depression, recurrent (HCC) 04/02/2021   H/O anxiety disorder 06/17/2020   H/O: depression 06/17/2020   HIV (human immunodeficiency virus) risk factors complicating pregnancy 11/25/2019   Genital herpes 04/27/2016   Type 2 diabetes mellitus (HCC) 11/17/2014   HIV disease (HCC) 01/15/2014   ASTHMA, EXERCISE INDUCED 03/30/2006    Past Surgical History:  Procedure Laterality Date   BREAST SURGERY  02/01/2008   Reduction   CESAREAN SECTION N/A 06/18/2020   Procedure: CESAREAN SECTION;  Surgeon: Bettina Muskrat, MD;  Location: [facility];  Service: Obstetrics;  Laterality: N/A;   CESAREAN SECTION N/A 04/13/2022   Procedure: CESAREAN SECTION;  Surgeon: Gloriann Chick, MD;  Location: [facility];  Service: Obstetrics;  Laterality: N/A;   CO2 LASER APPLICATION N/A 07/18/2013   Procedure: C02 laser of vagina and cervix;  Surgeon: Curlee VEAR Winfred, MD;  Location: [facility];  Service: Gynecology;  Laterality: N/A;  INCISION AND DRAINAGE ABSCESS Left 03/09/2017   Procedure: INCISION AND DRAINAGE ABSCESS LEFT BREAST ABSCESS;  Surgeon: Vanderbilt Ned, MD;  Location: [facility] OR;  Service: General;  Laterality: Left;    OB History     Gravida  3   Para  2   Term  2   Preterm  0   AB  1   Living  2      SAB  0   IAB  1   Ectopic  0   Multiple  0   Live Births  2            Home Medications    Prior to Admission  medications   Medication Sig Start Date End Date Taking? Authorizing Provider  amoxicillin -clavulanate (AUGMENTIN ) 875-125 MG tablet Take 1 tablet by mouth every 12 (twelve) hours. 02/10/23  Yes Vraj Denardo, PA  promethazine -dextromethorphan (PROMETHAZINE -DM) 6.25-15 MG/5ML syrup Take 5 mLs by mouth 4 (four) times daily as needed for up to 6 days for cough. 02/10/23 02/16/23 Yes Charniece Venturino, PA  acetaminophen  (TYLENOL ) 500 MG tablet Take 2 tablets (1,000 mg total) by mouth every 6 (six) hours. 04/16/22   Grice, Vivian B, CNM  bictegravir-emtricitabine -tenofovir  AF (BIKTARVY ) 50-200-25 MG TABS tablet Take 1 tablet by mouth daily. 12/27/22   Fleeta Kathie Jomarie LOISE, MD  clotrimazole  (LOTRIMIN ) 1 % cream Apply to affected area 2 times daily 08/24/22   Enedelia Dorna HERO, FNP  fluconazole  (DIFLUCAN ) 150 MG tablet Take 1 tablet (150 mg total) by mouth every 3 (three) days. 12/20/22   Christopher Savannah, PA-C  insulin  lispro (HUMALOG) 100 UNIT/ML injection Inject 2 Units into the skin 3 (three) times daily before meals. Sliding scale    [provider]  insulin  NPH Human (NOVOLIN N) 100 UNIT/ML injection Inject 8 Units into the skin 2 (two) times daily.    [provider]  metroNIDAZOLE  (FLAGYL ) 500 MG tablet Take 1 tablet (500 mg total) by mouth 2 (two) times daily with a meal. DO NOT CONSUME ALCOHOL WHILE TAKING THIS MEDICATION. Patient not taking: Reported on 02/10/2023 12/20/22   Christopher Savannah, PA-C  NIFEdipine  (PROCARDIA  XL/NIFEDICAL-XL) 90 MG 24 hr tablet Take 1 tablet (90 mg total) by mouth daily. 04/17/22   Montana , Jade, FNP  rosuvastatin  (CRESTOR ) 20 MG tablet Take 1 tablet (20 mg total) by mouth daily. 12/28/22   Fleeta Kathie Jomarie LOISE, MD  valACYclovir  (VALTREX ) 1000 MG tablet Take 1 tablet by mouth once daily. 01/02/23   Fleeta Kathie Jomarie LOISE, MD    Family History Family History  Problem Relation Age of Onset   Diabetes Mother    Hypertension Mother    Hyperlipidemia Mother     Mental illness Mother    Pulmonary embolism Mother    Cancer Paternal Aunt        Cervical or Ovarian per patient   Cancer Maternal Grandfather        ?   Heart disease Neg Hx    Asthma Neg Hx     Social History Social History   Tobacco Use   Smoking status: Never   Smokeless tobacco: Never  Vaping Use   Vaping status: Never Used  Substance Use Topics   Alcohol use: Not Currently    Comment: occasionally   Drug use: Not Currently    Types: Marijuana     Allergies   Cashew nut oil, Other, Peanut oil, Peanut-containing drug products, Pyridium [phenazopyridine hcl], Sulfa  antibiotics, and Bactrim  [sulfamethoxazole -trimethoprim ]   Review of  Systems Review of Systems  Constitutional:  Negative for fever.  HENT:  Positive for congestion and rhinorrhea. Negative for sore throat.   Respiratory:  Positive for cough. Negative for shortness of breath.   Gastrointestinal:  Negative for abdominal pain, nausea and vomiting.     Physical Exam Triage Vital Signs ED Triage Vitals  Encounter Vitals Group     BP 02/10/23 1117 135/88     Systolic BP Percentile --      Diastolic BP Percentile --      Pulse Rate 02/10/23 1117 (!) 108     Resp 02/10/23 1117 20     Temp 02/10/23 1117 98.2 F (36.8 C)     Temp Source 02/10/23 1117 Oral     SpO2 02/10/23 1117 97 %     Weight --      Height --      Head Circumference --      Peak Flow --      Pain Score 02/10/23 1123 0     Pain Loc --      Pain Education --      Exclude from Growth Chart --    No data found.  Updated Vital Signs BP 135/88 (BP Location: Right Arm)   Pulse (!) 108   Temp 98.2 F (36.8 C) (Oral)   Resp 20   LMP 01/21/2023 (Approximate)   SpO2 97%   Visual Acuity Right Eye Distance:   Left Eye Distance:   Bilateral Distance:    Right Eye Near:   Left Eye Near:    Bilateral Near:     Physical Exam Vitals and nursing note reviewed.  Constitutional:      Appearance: She is not ill-appearing.  HENT:      Head: Normocephalic and atraumatic.     Right Ear: Tympanic membrane and ear canal normal.     Left Ear: Tympanic membrane and ear canal normal.     Nose: Congestion present. No rhinorrhea.     Mouth/Throat:     Mouth: Mucous membranes are moist.     Pharynx: Oropharynx is clear. No oropharyngeal exudate or posterior oropharyngeal erythema.  Eyes:     Conjunctiva/sclera: Conjunctivae normal.  Cardiovascular:     Rate and Rhythm: Normal rate and regular rhythm.     Heart sounds: Normal heart sounds.  Pulmonary:     Effort: Pulmonary effort is normal.     Breath sounds: Normal breath sounds. No wheezing or rhonchi.  Musculoskeletal:     Cervical back: Neck supple.  Lymphadenopathy:     Cervical: No cervical adenopathy.  Skin:    General: Skin is warm.  Neurological:     Mental Status: She is alert and oriented to person, place, and time.  Psychiatric:        Mood and Affect: Mood normal.      UC Treatments / Results  Labs (all labs ordered are listed, but only abnormal results are displayed) Labs Reviewed - No data to display  EKG   Radiology No results found.  Procedures Procedures (including critical care time)  Medications Ordered in UC Medications - No data to display  Initial Impression / Assessment and Plan / UC Course  I have reviewed the triage vital signs and the nursing notes.  Pertinent labs & imaging results that were available during my care of the patient were reviewed by me and considered in my medical decision making (see chart for details).     33 year old female with 1-1/2  weeks of cough occasional sputum.  Denies fever, chest pain shortness of breath.  She is mildly tachycardic otherwise well-appearing, afebrile, lungs are clear to auscultation.  Discussed with patient likely viral bronchitis will treat with cough medicine, if symptoms fail to improve or development of new fever can for Final Clinical Impressions(s) / UC Diagnoses   Final  diagnoses:  Acute bronchitis, unspecified organism     Discharge Instructions      If symptoms worsen fill the antibiotic as written Follow-up with your doctor   ED Prescriptions     Medication Sig Dispense Auth. Provider   promethazine -dextromethorphan (PROMETHAZINE -DM) 6.25-15 MG/5ML syrup Take 5 mLs by mouth 4 (four) times daily as needed for up to 6 days for cough. 118 mL Engelbert Sevin, [place]   amoxicillin -clavulanate (AUGMENTIN ) 875-125 MG tablet Take 1 tablet by mouth every 12 (twelve) hours. 14 tablet Colin Ellers, [place]      PDMP not reviewed this encounter.   Tynika Luddy, [place] 02/10/23 1146

## 2023-02-16 ENCOUNTER — Other Ambulatory Visit (HOSPITAL_COMMUNITY): Payer: Self-pay

## 2023-02-20 ENCOUNTER — Other Ambulatory Visit: Payer: Self-pay

## 2023-02-20 ENCOUNTER — Encounter (HOSPITAL_COMMUNITY): Payer: Self-pay

## 2023-02-20 ENCOUNTER — Other Ambulatory Visit (HOSPITAL_COMMUNITY): Payer: Self-pay

## 2023-02-20 ENCOUNTER — Ambulatory Visit: Payer: 59 | Admitting: Infectious Disease

## 2023-02-22 ENCOUNTER — Other Ambulatory Visit: Payer: Self-pay

## 2023-02-22 NOTE — Progress Notes (Signed)
Specialty Pharmacy Ongoing Clinical Assessment Note  Kelly Hunter is a 33 y.o. female who is being followed by the specialty pharmacy service for RxSp HIV   Patient's specialty medication(s) reviewed today: Bictegravir-Emtricitab-Tenofov (Biktarvy)   Missed doses in the last 4 weeks: 0   Patient/Caregiver did not have any additional questions or concerns.   Therapeutic benefit summary: Patient is achieving benefit   Adverse events/side effects summary: No adverse events/side effects   Patient's therapy is appropriate to: Continue    Goals Addressed             This Visit's Progress    Achieve Undetectable HIV Viral Load < 20       Patient is on track. Patient will maintain adherence         Follow up:  6 months  Jayon Matton E [facility] Specialty Pharmacist

## 2023-02-22 NOTE — Progress Notes (Signed)
Specialty Pharmacy Refill Coordination Note  Kelly Hunter is a 33 y.o. female contacted today regarding refills of specialty medication(s) Bictegravir-Emtricitab-Tenofov [name]   Patient requested Delivery   Delivery date: 02/28/23   Verified address: [address] [place], [place] [address]   Medication will be filled on 02/27/2023.

## 2023-02-27 ENCOUNTER — Other Ambulatory Visit: Payer: Self-pay

## 2023-03-02 ENCOUNTER — Ambulatory Visit (INDEPENDENT_AMBULATORY_CARE_PROVIDER_SITE_OTHER): Payer: 59 | Admitting: Dermatology

## 2023-03-02 ENCOUNTER — Encounter: Payer: Self-pay | Admitting: Dermatology

## 2023-03-02 VITALS — BP 133/87 | HR 98

## 2023-03-02 DIAGNOSIS — L910 Hypertrophic scar: Secondary | ICD-10-CM

## 2023-03-02 MED ORDER — TRIAMCINOLONE ACETONIDE 10 MG/ML IJ SUSP
10.0000 mg | Freq: Once | INTRAMUSCULAR | Status: AC
  Administered 2023-03-02: 10 mg

## 2023-03-02 NOTE — Progress Notes (Signed)
   New Patient Visit   Subjective  Kelly Hunter is a 33 y.o. female who presents for the following: Growth  Patient states she has growth located at the neck that she would like to have examined. Patient reports the areas have been there for 6 years. She reports the areas are bothersome.Patient rates irritation 4 out of 10. She states that the areas have not spread. Patient reports she has not previously been treated for these areas.   The following portions of the chart were reviewed this encounter and updated as appropriate: medications, allergies, medical history  Review of Systems:  No other skin or systemic complaints except as noted in HPI or Assessment and Plan.  Objective  Well appearing patient in no apparent distress; mood and affect are within normal limits.  A focused examination was performed of the following areas: Posterior Neck  Relevant exam findings are noted in the Assessment and Plan.         Assessment & Plan   KELOID Neck - Posterior Procedure Note Intralesional Injection  Location: Posterior Neck  Informed Consent: Discussed risks (infection, pain, bleeding, bruising, thinning of the skin, loss of skin pigment, lack of resolution, and recurrence of lesion) and benefits of the procedure, as well as the alternatives. Informed consent was obtained. Preparation: The area was prepared a standard fashion.  Anesthesia: None  Procedure Details: An intralesional injection was performed with Kenalog 10 mg/cc. 0.3 cc in total were injected. NDC #: [id number] Exp: [id number]  Total number of injections: 3  Plan: The patient was instructed on post-op care. Recommend OTC analgesia as needed for pain.  triamcinolone acetonide (KENALOG) 10 MG/ML injection 10 mg - Neck - Posterior   Return in about 8 weeks (around 04/27/2023) for Keloid F/U.  Documentation: I have reviewed the above documentation for accuracy and completeness, and I agree with the above.  Stasia Cavalier, am acting as scribe for Langston Reusing, DO.  Langston Reusing, DO

## 2023-03-02 NOTE — Patient Instructions (Addendum)
Hello Kelly Hunter,  Thank you for visiting my office today. Below are the essential instructions and information from today's consultation:  Diagnosis:  Keloid.  We injected with kenalog steroid today  Treatment Plan:   Frequency: The injections will be administered every 8 weeks. This approach aims to flatten and lighten the keloid while also targeting the cyst to prevent it from becoming symptomatic.  Post-Treatment Care:   Instructions: Avoid touching or squeezing the treated area to prevent displacing the medication. A band-aid has been applied; it should remain in place until it falls off naturally.  Follow-Up:   Next Steps: We will continue with the current treatment and monitor the progress during your follow-up visits.   Next Appointment: Scheduled in approximately 8 weeks, at which point we will reassess and possibly adjust the treatment plan.  I am looking forward to our continued progress and am here to assist you every step of the way. Should you have any questions or concerns before your next visit, please feel free to reach out to the office.  Warm regards,  Dr. Langston Reusing,  Dermatologist    Important Information   Due to recent changes in healthcare laws, you may see results of your pathology and/or laboratory studies on MyChart before the doctors have had a chance to review them. We understand that in some cases there may be results that are confusing or concerning to you. Please understand that not all results are received at the same time and often the doctors may need to interpret multiple results in order to provide you with the best plan of care or course of treatment. Therefore, we ask that you please give [place] 2 business days to thoroughly review all your results before contacting the office for clarification. Should we see a critical lab result, you will be contacted sooner.     If You Need Anything After Your Visit   If you have any questions or concerns for  your doctor, please call our main line at [phone]. If no one answers, please leave a voicemail as directed and we will return your call as soon as possible. Messages left after 4 pm will be answered the following business day.    You may also send [place] a message via MyChart. We typically respond to MyChart messages within 1-2 business days.  For prescription refills, please ask your pharmacy to contact our office. Our fax number is [phone].  If you have an urgent issue when the clinic is closed that cannot wait until the next business day, you can page your doctor at the number below.     Please note that while we do our best to be available for urgent issues outside of office hours, we are not available 24/7.    If you have an urgent issue and are unable to reach [place], you may choose to seek medical care at your doctor's office, retail clinic, urgent care center, or emergency room.   If you have a medical emergency, please immediately call 911 or go to the emergency department. In the event of inclement weather, please call our main line at [phone] for an update on the status of any delays or closures.  Dermatology Medication Tips: Please keep the boxes that topical medications come in in order to help keep track of the instructions about where and how to use these. Pharmacies typically print the medication instructions only on the boxes and not directly on the medication tubes.   If your medication is  too expensive, please contact our office at [phone] or send [place] a message through MyChart.    We are unable to tell what your co-pay for medications will be in advance as this is different depending on your insurance coverage. However, we may be able to find a substitute medication at lower cost or fill out paperwork to get insurance to cover a needed medication.    If a prior authorization is required to get your medication covered by your insurance company, please allow [place] 1-2  business days to complete this process.   Drug prices often vary depending on where the prescription is filled and some pharmacies may offer cheaper prices.   The website www.goodrx.com contains coupons for medications through different pharmacies. The prices here do not account for what the cost may be with help from insurance (it may be cheaper with your insurance), but the website can give you the price if you did not use any insurance.  - You can print the associated coupon and take it with your prescription to the pharmacy.  - You may also stop by our office during regular business hours and pick up a GoodRx coupon card.  - If you need your prescription sent electronically to a different pharmacy, notify our office through [facility] or by phone at [phone]

## 2023-03-13 ENCOUNTER — Other Ambulatory Visit (HOSPITAL_COMMUNITY): Payer: Self-pay

## 2023-03-16 ENCOUNTER — Other Ambulatory Visit (HOSPITAL_COMMUNITY): Payer: Self-pay

## 2023-03-22 ENCOUNTER — Other Ambulatory Visit: Payer: Self-pay

## 2023-03-22 NOTE — Progress Notes (Signed)
Specialty Pharmacy Refill Coordination Note  Kelly Hunter is a 33 y.o. female contacted today regarding refills of specialty medication(s) Bictegravir-Emtricitab-Tenofov [name]   Patient requested Delivery   Delivery date: 03/30/23   Verified address: [address] [place], [place] [address]   Medication will be filled on 02.26.25.

## 2023-04-13 DIAGNOSIS — F332 Major depressive disorder, recurrent severe without psychotic features: Secondary | ICD-10-CM | POA: Diagnosis not present

## 2023-04-14 DIAGNOSIS — F603 Borderline personality disorder: Secondary | ICD-10-CM | POA: Diagnosis not present

## 2023-04-14 DIAGNOSIS — F332 Major depressive disorder, recurrent severe without psychotic features: Secondary | ICD-10-CM | POA: Diagnosis not present

## 2023-04-14 DIAGNOSIS — I10 Essential (primary) hypertension: Secondary | ICD-10-CM | POA: Diagnosis not present

## 2023-04-14 DIAGNOSIS — F3181 Bipolar II disorder: Secondary | ICD-10-CM | POA: Diagnosis not present

## 2023-04-14 DIAGNOSIS — B20 Human immunodeficiency virus [HIV] disease: Secondary | ICD-10-CM | POA: Diagnosis not present

## 2023-04-15 DIAGNOSIS — I10 Essential (primary) hypertension: Secondary | ICD-10-CM | POA: Diagnosis not present

## 2023-04-17 DIAGNOSIS — F332 Major depressive disorder, recurrent severe without psychotic features: Secondary | ICD-10-CM | POA: Diagnosis not present

## 2023-04-17 DIAGNOSIS — I10 Essential (primary) hypertension: Secondary | ICD-10-CM | POA: Diagnosis not present

## 2023-04-17 DIAGNOSIS — E785 Hyperlipidemia, unspecified: Secondary | ICD-10-CM | POA: Diagnosis not present

## 2023-04-17 DIAGNOSIS — E119 Type 2 diabetes mellitus without complications: Secondary | ICD-10-CM | POA: Diagnosis not present

## 2023-04-17 DIAGNOSIS — E8881 Metabolic syndrome: Secondary | ICD-10-CM | POA: Diagnosis not present

## 2023-04-17 DIAGNOSIS — Z712 Person consulting for explanation of examination or test findings: Secondary | ICD-10-CM | POA: Diagnosis not present

## 2023-04-18 DIAGNOSIS — F332 Major depressive disorder, recurrent severe without psychotic features: Secondary | ICD-10-CM | POA: Diagnosis not present

## 2023-04-19 DIAGNOSIS — F332 Major depressive disorder, recurrent severe without psychotic features: Secondary | ICD-10-CM | POA: Diagnosis not present

## 2023-04-20 DIAGNOSIS — F332 Major depressive disorder, recurrent severe without psychotic features: Secondary | ICD-10-CM | POA: Diagnosis not present

## 2023-04-24 ENCOUNTER — Other Ambulatory Visit: Payer: Self-pay

## 2023-04-26 ENCOUNTER — Other Ambulatory Visit: Payer: Self-pay

## 2023-04-26 ENCOUNTER — Other Ambulatory Visit: Payer: Self-pay | Admitting: Pharmacy Technician

## 2023-04-26 NOTE — Progress Notes (Signed)
 Specialty Pharmacy Refill Coordination Note  Kelly Hunter is a 33 y.o. female contacted today regarding refills of specialty medication(s) Bictegravir-Emtricitab-Tenofov [name]   Patient requested Delivery   Delivery date: 05/03/23   Verified address: [address] [place]   Medication will be filled on 05/02/23.

## 2023-05-02 ENCOUNTER — Other Ambulatory Visit: Payer: Self-pay

## 2023-05-04 ENCOUNTER — Ambulatory Visit: Payer: 59 | Admitting: Dermatology

## 2023-05-24 ENCOUNTER — Other Ambulatory Visit: Payer: Self-pay

## 2023-05-29 ENCOUNTER — Other Ambulatory Visit (HOSPITAL_COMMUNITY): Payer: Self-pay

## 2023-05-29 DIAGNOSIS — R300 Dysuria: Secondary | ICD-10-CM | POA: Diagnosis not present

## 2023-05-29 DIAGNOSIS — Z8632 Personal history of gestational diabetes: Secondary | ICD-10-CM | POA: Diagnosis not present

## 2023-05-29 DIAGNOSIS — N898 Other specified noninflammatory disorders of vagina: Secondary | ICD-10-CM | POA: Diagnosis not present

## 2023-05-29 DIAGNOSIS — B3731 Acute candidiasis of vulva and vagina: Secondary | ICD-10-CM | POA: Diagnosis not present

## 2023-05-29 DIAGNOSIS — Z3200 Encounter for pregnancy test, result unknown: Secondary | ICD-10-CM | POA: Diagnosis not present

## 2023-05-29 DIAGNOSIS — Z6841 Body Mass Index (BMI) 40.0 and over, adult: Secondary | ICD-10-CM | POA: Diagnosis not present

## 2023-05-31 ENCOUNTER — Other Ambulatory Visit (HOSPITAL_COMMUNITY): Payer: Self-pay

## 2023-05-31 ENCOUNTER — Ambulatory Visit: Admitting: Dermatology

## 2023-06-05 DIAGNOSIS — Z6835 Body mass index (BMI) 35.0-35.9, adult: Secondary | ICD-10-CM | POA: Diagnosis not present

## 2023-06-05 DIAGNOSIS — D539 Nutritional anemia, unspecified: Secondary | ICD-10-CM | POA: Diagnosis not present

## 2023-06-05 DIAGNOSIS — R0602 Shortness of breath: Secondary | ICD-10-CM | POA: Diagnosis not present

## 2023-06-05 DIAGNOSIS — Z0000 Encounter for general adult medical examination without abnormal findings: Secondary | ICD-10-CM | POA: Diagnosis not present

## 2023-06-05 DIAGNOSIS — R5383 Other fatigue: Secondary | ICD-10-CM | POA: Diagnosis not present

## 2023-06-05 DIAGNOSIS — E559 Vitamin D deficiency, unspecified: Secondary | ICD-10-CM | POA: Diagnosis not present

## 2023-06-05 DIAGNOSIS — Z1159 Encounter for screening for other viral diseases: Secondary | ICD-10-CM | POA: Diagnosis not present

## 2023-06-22 ENCOUNTER — Other Ambulatory Visit (HOSPITAL_COMMUNITY): Payer: Self-pay

## 2023-06-26 DIAGNOSIS — E1165 Type 2 diabetes mellitus with hyperglycemia: Secondary | ICD-10-CM | POA: Diagnosis not present

## 2023-06-26 DIAGNOSIS — B3731 Acute candidiasis of vulva and vagina: Secondary | ICD-10-CM | POA: Diagnosis not present

## 2023-06-26 DIAGNOSIS — E559 Vitamin D deficiency, unspecified: Secondary | ICD-10-CM | POA: Diagnosis not present

## 2023-06-29 ENCOUNTER — Other Ambulatory Visit: Payer: Self-pay

## 2023-06-29 ENCOUNTER — Other Ambulatory Visit (HOSPITAL_COMMUNITY): Payer: Self-pay

## 2023-06-29 NOTE — Progress Notes (Signed)
 Specialty Pharmacy Refill Coordination Note  Spoke with Virgle Grime T "Britt" (Self).   SHANTERA MONTS is a 33 y.o. female contacted today regarding refills of specialty medication(s) Bictegravir-Emtricitab-Tenofov (Biktarvy )   Patient requested [place] Dk at [facility] at [place] date: 06/30/23   Medication will be filled on 06/29/23.

## 2023-06-29 NOTE — Progress Notes (Signed)
 Clinical Intervention Note  Clinical Intervention Notes: Patient reported starting Vitamin D, metformin , and Mounjaro. Taking metformin  and Biktarvy  concomitantly can increase the risk of lactic acidosis from metformin . Left voicemail with patient to call RPh back so that we can discuss use and advise patient to reach out to her provider if she notices any signs/symptoms of lactic acidosis (N/V, fatigue, cramps/muscle aches). No DDIs identified between Vitamin D and Mounjaro with Biktarvy .   Clinical Intervention Outcomes: Prevention of an adverse drug event   Advertising account planner

## 2023-07-21 ENCOUNTER — Other Ambulatory Visit: Payer: Self-pay

## 2023-07-24 ENCOUNTER — Other Ambulatory Visit: Payer: Self-pay

## 2023-07-26 ENCOUNTER — Other Ambulatory Visit: Payer: Self-pay

## 2023-08-08 ENCOUNTER — Other Ambulatory Visit: Payer: Self-pay

## 2023-08-08 NOTE — Progress Notes (Signed)
 Specialty Pharmacy Refill Coordination Note  Kelly Hunter is a 33 y.o. female contacted today regarding refills of specialty medication(s) Bictegravir-Emtricitab-Tenofov (Biktarvy )   Patient requested [name] at [facility] at [place] date: 08/08/23   Medication will be filled on 08/08/23.

## 2023-08-29 ENCOUNTER — Other Ambulatory Visit (HOSPITAL_COMMUNITY): Payer: Self-pay

## 2023-09-02 ENCOUNTER — Encounter (INDEPENDENT_AMBULATORY_CARE_PROVIDER_SITE_OTHER): Payer: Self-pay

## 2023-09-04 ENCOUNTER — Other Ambulatory Visit: Payer: Self-pay | Admitting: Pharmacy Technician

## 2023-09-04 ENCOUNTER — Other Ambulatory Visit: Payer: Self-pay

## 2023-09-04 NOTE — Progress Notes (Signed)
 Specialty Pharmacy Refill Coordination Note  Kelly Hunter is a 33 y.o. female contacted today regarding refills of specialty medication(s)   Bictegravir-Emtricitab-Tenofov (Biktarvy )    Patient requested Delivery Delivery date: 09/06/23 Verified address: [address] [place] [address]   Medication will be filled on 09/05/23.   Patient answered questionnaire

## 2023-09-05 ENCOUNTER — Other Ambulatory Visit: Payer: Self-pay

## 2023-10-04 ENCOUNTER — Other Ambulatory Visit: Payer: Self-pay

## 2023-10-04 NOTE — Progress Notes (Signed)
 Specialty Pharmacy Refill Coordination Note  Kelly Hunter is a 33 y.o. female contacted today regarding refills of specialty medication(s) Bictegravir-Emtricitab-Tenofov (Biktarvy )   Patient requested Delivery   Delivery date: 10/06/23   Verified address: [address] [place] [address]   Medication will be filled on 09.04.25.

## 2023-10-30 ENCOUNTER — Other Ambulatory Visit: Payer: Self-pay

## 2023-11-01 ENCOUNTER — Other Ambulatory Visit: Payer: Self-pay

## 2023-11-03 ENCOUNTER — Other Ambulatory Visit: Payer: Self-pay

## 2023-11-21 ENCOUNTER — Other Ambulatory Visit: Payer: Self-pay

## 2023-11-23 ENCOUNTER — Other Ambulatory Visit: Payer: Self-pay

## 2023-11-23 ENCOUNTER — Other Ambulatory Visit (HOSPITAL_COMMUNITY): Payer: Self-pay

## 2023-11-23 NOTE — Progress Notes (Signed)
 Specialty Pharmacy Ongoing Clinical Assessment Note  Kelly Hunter is a 33 y.o. female who is being followed by the specialty pharmacy service for RxSp HIV   Patient's specialty medication(s) reviewed today: Bictegravir-Emtricitab-Tenofov (Biktarvy )   Missed doses in the last 4 weeks: 0   Patient/Caregiver did not have any additional questions or concerns.   Therapeutic benefit summary: Patient is achieving benefit   Adverse events/side effects summary: No adverse events/side effects   Patient's therapy is appropriate to: Continue    Goals Addressed             This Visit's Progress    Achieve Undetectable HIV Viral Load < 20   On track    Patient is on track. Patient will maintain adherence         Follow up: 12 months  [name] Specialty Pharmacist

## 2023-11-23 NOTE — Progress Notes (Signed)
 Specialty Pharmacy Refill Coordination Note  Kelly Hunter is a 33 y.o. female contacted today regarding refills of specialty medication(s) Bictegravir-Emtricitab-Tenofov (Biktarvy )   Patient requested [name] at [facility] at [place] date: 11/23/23   Medication will be filled on 11/23/23.

## 2023-12-14 ENCOUNTER — Other Ambulatory Visit: Payer: Self-pay

## 2023-12-18 ENCOUNTER — Other Ambulatory Visit: Payer: Self-pay

## 2023-12-18 ENCOUNTER — Encounter (INDEPENDENT_AMBULATORY_CARE_PROVIDER_SITE_OTHER): Payer: Self-pay

## 2023-12-18 ENCOUNTER — Other Ambulatory Visit (HOSPITAL_COMMUNITY): Payer: Self-pay

## 2023-12-18 NOTE — Progress Notes (Signed)
 Specialty Pharmacy Refill Coordination Note  Kelly Hunter is a 33 y.o. female contacted today regarding refills of specialty medication(s) Bictegravir-Emtricitab-Tenofov (Biktarvy )   Patient requested (Patient-Rptd) Delivery   Delivery date: 12/20/23   Verified address: (Patient-Rptd) [address] [place], [place] [address]   Medication will be filled on: 12/19/23

## 2024-01-11 ENCOUNTER — Other Ambulatory Visit: Payer: Self-pay

## 2024-01-11 ENCOUNTER — Other Ambulatory Visit: Payer: Self-pay | Admitting: Infectious Disease

## 2024-01-11 ENCOUNTER — Ambulatory Visit: Admitting: Dermatology

## 2024-01-11 NOTE — Telephone Encounter (Signed)
 My chart message sent

## 2024-01-15 ENCOUNTER — Other Ambulatory Visit: Payer: Self-pay

## 2024-01-17 ENCOUNTER — Other Ambulatory Visit: Payer: Self-pay | Admitting: Infectious Disease

## 2024-01-17 ENCOUNTER — Other Ambulatory Visit: Payer: Self-pay | Admitting: Pharmacy Technician

## 2024-01-17 ENCOUNTER — Other Ambulatory Visit: Payer: Self-pay

## 2024-01-17 DIAGNOSIS — B20 Human immunodeficiency virus [HIV] disease: Secondary | ICD-10-CM

## 2024-01-17 MED ORDER — BIKTARVY 50-200-25 MG PO TABS
1.0000 | ORAL_TABLET | Freq: Every day | ORAL | 0 refills | Status: DC
  Filled 2024-01-17: qty 30, 30d supply, fill #0

## 2024-01-17 NOTE — Progress Notes (Signed)
 Specialty Pharmacy Refill Coordination Note  Kelly Hunter is a 33 y.o. female contacted today regarding refills of specialty medication(s) Bictegravir-Emtricitab-Tenofov (Biktarvy )   Patient requested Delivery   Delivery date: 01/19/24   Verified address: [address]  [place]   Medication will be filled on: 01/18/24  This fill date is pending response to refill request from provider. Patient is aware and if they have not received fill by intended date they must follow up with pharmacy.

## 2024-01-18 ENCOUNTER — Other Ambulatory Visit: Payer: Self-pay

## 2024-02-05 ENCOUNTER — Ambulatory Visit: Admitting: Infectious Disease

## 2024-02-14 ENCOUNTER — Other Ambulatory Visit: Payer: Self-pay | Admitting: Infectious Disease

## 2024-02-14 ENCOUNTER — Other Ambulatory Visit: Payer: Self-pay

## 2024-02-14 DIAGNOSIS — B20 Human immunodeficiency virus [HIV] disease: Secondary | ICD-10-CM

## 2024-02-15 ENCOUNTER — Other Ambulatory Visit: Payer: Self-pay

## 2024-02-28 ENCOUNTER — Other Ambulatory Visit: Payer: Self-pay

## 2024-02-28 ENCOUNTER — Other Ambulatory Visit

## 2024-02-28 DIAGNOSIS — B20 Human immunodeficiency virus [HIV] disease: Secondary | ICD-10-CM

## 2024-02-28 DIAGNOSIS — Z113 Encounter for screening for infections with a predominantly sexual mode of transmission: Secondary | ICD-10-CM

## 2024-02-28 MED ORDER — BIKTARVY 50-200-25 MG PO TABS
1.0000 | ORAL_TABLET | Freq: Every day | ORAL | 1 refills | Status: AC
  Filled 2024-02-28: qty 30, 30d supply, fill #0

## 2024-03-06 ENCOUNTER — Other Ambulatory Visit (HOSPITAL_COMMUNITY): Payer: Self-pay

## 2024-03-06 ENCOUNTER — Other Ambulatory Visit: Payer: Self-pay

## 2024-03-06 NOTE — Progress Notes (Signed)
 Specialty Pharmacy Refill Coordination Note  Kelly Hunter is a 34 y.o. female contacted today regarding refills of specialty medication(s) Bictegravir-Emtricitab-Tenofov (Biktarvy )   Patient requested [name] at [facility] at [place] date: 03/07/24   Medication will be filled on: 03/06/24

## 2024-04-08 ENCOUNTER — Ambulatory Visit: Payer: Self-pay | Admitting: Infectious Disease

## 2024-06-19 ENCOUNTER — Ambulatory Visit: Admitting: Dermatology
